# Patient Record
Sex: Male | Born: 1940 | Race: Black or African American | Hispanic: No
Health system: Southern US, Community
[De-identification: ages and names within clinical notes are randomized; demographics above are authoritative.]

## PROBLEM LIST (undated history)

## (undated) DIAGNOSIS — I10 Essential (primary) hypertension: Secondary | ICD-10-CM

## (undated) DIAGNOSIS — F0390 Unspecified dementia without behavioral disturbance: Secondary | ICD-10-CM

## (undated) DIAGNOSIS — E119 Type 2 diabetes mellitus without complications: Secondary | ICD-10-CM

## (undated) DIAGNOSIS — G5600 Carpal tunnel syndrome, unspecified upper limb: Secondary | ICD-10-CM

## (undated) DIAGNOSIS — H543 Unqualified visual loss, both eyes: Secondary | ICD-10-CM

## (undated) DIAGNOSIS — I219 Acute myocardial infarction, unspecified: Secondary | ICD-10-CM

## (undated) DIAGNOSIS — E7800 Pure hypercholesterolemia, unspecified: Secondary | ICD-10-CM

## (undated) DIAGNOSIS — E1139 Type 2 diabetes mellitus with other diabetic ophthalmic complication: Secondary | ICD-10-CM

## (undated) DIAGNOSIS — E1142 Type 2 diabetes mellitus with diabetic polyneuropathy: Secondary | ICD-10-CM

## (undated) DIAGNOSIS — K219 Gastro-esophageal reflux disease without esophagitis: Secondary | ICD-10-CM

## (undated) DIAGNOSIS — N529 Male erectile dysfunction, unspecified: Secondary | ICD-10-CM

## (undated) DIAGNOSIS — I2119 ST elevation (STEMI) myocardial infarction involving other coronary artery of inferior wall: Secondary | ICD-10-CM

## (undated) DIAGNOSIS — I2510 Atherosclerotic heart disease of native coronary artery without angina pectoris: Secondary | ICD-10-CM

## (undated) DIAGNOSIS — E041 Nontoxic single thyroid nodule: Secondary | ICD-10-CM

## (undated) DIAGNOSIS — H409 Unspecified glaucoma: Secondary | ICD-10-CM

## (undated) DIAGNOSIS — R972 Elevated prostate specific antigen [PSA]: Secondary | ICD-10-CM

## (undated) DIAGNOSIS — H548 Legal blindness, as defined in USA: Secondary | ICD-10-CM

## (undated) DIAGNOSIS — I639 Cerebral infarction, unspecified: Secondary | ICD-10-CM

## (undated) HISTORY — DX: Essential (primary) hypertension: I10

## (undated) HISTORY — PX: EYE SURGERY: SHX253

## (undated) HISTORY — DX: Male erectile dysfunction, unspecified: N52.9

## (undated) HISTORY — DX: Type 2 diabetes mellitus with diabetic polyneuropathy: E11.42

## (undated) HISTORY — DX: Carpal tunnel syndrome, unspecified upper limb: G56.00

## (undated) HISTORY — DX: Atherosclerotic heart disease of native coronary artery without angina pectoris: I25.10

## (undated) HISTORY — DX: Type 2 diabetes mellitus with other diabetic ophthalmic complication: E11.39

## (undated) HISTORY — DX: Elevated prostate specific antigen (PSA): R97.20

## (undated) HISTORY — DX: Acute myocardial infarction, unspecified: I21.9

## (undated) HISTORY — DX: Legal blindness, as defined in USA: H54.8

## (undated) HISTORY — DX: Gastro-esophageal reflux disease without esophagitis: K21.9

## (undated) HISTORY — PX: NECK MASS EXCISION: SHX2079

## (undated) HISTORY — PX: GLAUCOMA SURGERY: SHX656

## (undated) HISTORY — DX: Nontoxic single thyroid nodule: E04.1

---

## 1989-01-22 DIAGNOSIS — I219 Acute myocardial infarction, unspecified: Secondary | ICD-10-CM

## 1989-01-22 HISTORY — DX: Acute myocardial infarction, unspecified: I21.9

## 1997-06-21 ENCOUNTER — Emergency Department (HOSPITAL_COMMUNITY): Admission: EM | Admit: 1997-06-21 | Discharge: 1997-06-21 | Payer: Self-pay | Admitting: Emergency Medicine

## 1998-01-31 ENCOUNTER — Encounter: Payer: Self-pay | Admitting: Internal Medicine

## 1998-01-31 ENCOUNTER — Ambulatory Visit (HOSPITAL_COMMUNITY): Admission: RE | Admit: 1998-01-31 | Discharge: 1998-01-31 | Payer: Self-pay | Admitting: Internal Medicine

## 1998-11-14 ENCOUNTER — Emergency Department (HOSPITAL_COMMUNITY): Admission: EM | Admit: 1998-11-14 | Discharge: 1998-11-14 | Payer: Self-pay | Admitting: Emergency Medicine

## 1998-11-14 ENCOUNTER — Encounter: Payer: Self-pay | Admitting: Emergency Medicine

## 2000-01-23 HISTORY — PX: CORONARY ARTERY BYPASS GRAFT: SHX141

## 2000-04-25 ENCOUNTER — Inpatient Hospital Stay (HOSPITAL_COMMUNITY): Admission: EM | Admit: 2000-04-25 | Discharge: 2000-05-05 | Payer: Self-pay | Admitting: Emergency Medicine

## 2000-04-25 ENCOUNTER — Encounter: Payer: Self-pay | Admitting: Emergency Medicine

## 2000-04-26 HISTORY — PX: CORONARY ANGIOPLASTY WITH STENT PLACEMENT: SHX49

## 2000-04-27 ENCOUNTER — Encounter: Payer: Self-pay | Admitting: Surgery

## 2000-04-29 ENCOUNTER — Encounter: Payer: Self-pay | Admitting: Surgery

## 2000-04-30 ENCOUNTER — Encounter: Payer: Self-pay | Admitting: Surgery

## 2000-05-01 ENCOUNTER — Encounter: Payer: Self-pay | Admitting: Surgery

## 2000-05-28 ENCOUNTER — Encounter (HOSPITAL_COMMUNITY): Admission: RE | Admit: 2000-05-28 | Discharge: 2000-08-13 | Payer: Self-pay | Admitting: Cardiology

## 2002-11-18 ENCOUNTER — Emergency Department (HOSPITAL_COMMUNITY): Admission: EM | Admit: 2002-11-18 | Discharge: 2002-11-18 | Payer: Self-pay | Admitting: Emergency Medicine

## 2002-12-01 ENCOUNTER — Ambulatory Visit (HOSPITAL_COMMUNITY): Admission: RE | Admit: 2002-12-01 | Discharge: 2002-12-01 | Payer: Self-pay | Admitting: Internal Medicine

## 2004-01-03 ENCOUNTER — Ambulatory Visit (HOSPITAL_COMMUNITY): Admission: RE | Admit: 2004-01-03 | Discharge: 2004-01-04 | Payer: Self-pay | Admitting: Ophthalmology

## 2004-01-20 ENCOUNTER — Ambulatory Visit: Payer: Self-pay | Admitting: Internal Medicine

## 2004-01-25 ENCOUNTER — Ambulatory Visit: Payer: Self-pay | Admitting: Internal Medicine

## 2004-04-25 ENCOUNTER — Ambulatory Visit: Payer: Self-pay | Admitting: Internal Medicine

## 2004-05-01 ENCOUNTER — Ambulatory Visit (HOSPITAL_COMMUNITY): Admission: RE | Admit: 2004-05-01 | Discharge: 2004-05-02 | Payer: Self-pay | Admitting: Ophthalmology

## 2004-05-01 HISTORY — PX: VITRECTOMY: SHX106

## 2004-05-10 ENCOUNTER — Ambulatory Visit: Payer: Self-pay | Admitting: Internal Medicine

## 2004-07-27 ENCOUNTER — Ambulatory Visit: Payer: Self-pay | Admitting: Internal Medicine

## 2004-08-17 ENCOUNTER — Ambulatory Visit: Payer: Self-pay | Admitting: Internal Medicine

## 2004-08-25 ENCOUNTER — Ambulatory Visit: Payer: Self-pay | Admitting: Internal Medicine

## 2004-09-11 ENCOUNTER — Encounter: Admission: RE | Admit: 2004-09-11 | Discharge: 2004-09-11 | Payer: Self-pay | Admitting: Cardiology

## 2004-09-19 ENCOUNTER — Ambulatory Visit: Payer: Self-pay | Admitting: Internal Medicine

## 2004-10-22 HISTORY — PX: LAPAROSCOPIC CHOLECYSTECTOMY: SUR755

## 2004-11-06 ENCOUNTER — Encounter (INDEPENDENT_AMBULATORY_CARE_PROVIDER_SITE_OTHER): Payer: Self-pay | Admitting: *Deleted

## 2004-11-06 ENCOUNTER — Ambulatory Visit (HOSPITAL_COMMUNITY): Admission: RE | Admit: 2004-11-06 | Discharge: 2004-11-07 | Payer: Self-pay | Admitting: General Surgery

## 2005-02-12 ENCOUNTER — Inpatient Hospital Stay (HOSPITAL_COMMUNITY): Admission: EM | Admit: 2005-02-12 | Discharge: 2005-02-13 | Payer: Self-pay | Admitting: Emergency Medicine

## 2005-03-02 ENCOUNTER — Ambulatory Visit: Payer: Self-pay | Admitting: Internal Medicine

## 2005-05-30 ENCOUNTER — Ambulatory Visit: Payer: Self-pay | Admitting: Internal Medicine

## 2005-05-31 ENCOUNTER — Ambulatory Visit: Payer: Self-pay | Admitting: Internal Medicine

## 2005-08-29 ENCOUNTER — Ambulatory Visit: Payer: Self-pay | Admitting: Internal Medicine

## 2005-09-04 ENCOUNTER — Ambulatory Visit: Payer: Self-pay | Admitting: Internal Medicine

## 2005-12-04 ENCOUNTER — Ambulatory Visit: Payer: Self-pay | Admitting: Internal Medicine

## 2006-05-14 ENCOUNTER — Ambulatory Visit: Payer: Self-pay | Admitting: Internal Medicine

## 2006-05-14 LAB — CONVERTED CEMR LAB
BUN: 15 mg/dL (ref 6–23)
Basophils Absolute: 0 10*3/uL (ref 0.0–0.1)
Basophils Relative: 0.1 % (ref 0.0–1.0)
CO2: 30 meq/L (ref 19–32)
Calcium: 9.5 mg/dL (ref 8.4–10.5)
Chloride: 109 meq/L (ref 96–112)
Creatinine, Ser: 1.6 mg/dL — ABNORMAL HIGH (ref 0.4–1.5)
Eosinophils Absolute: 0.2 10*3/uL (ref 0.0–0.6)
Eosinophils Relative: 2.5 % (ref 0.0–5.0)
GFR calc Af Amer: 56 mL/min
GFR calc non Af Amer: 46 mL/min
Glucose, Bld: 146 mg/dL — ABNORMAL HIGH (ref 70–99)
HCT: 44.4 % (ref 39.0–52.0)
Hemoglobin: 14.4 g/dL (ref 13.0–17.0)
Hgb A1c MFr Bld: 8.6 % — ABNORMAL HIGH (ref 4.6–6.0)
Lymphocytes Relative: 36.6 % (ref 12.0–46.0)
MCHC: 32.5 g/dL (ref 30.0–36.0)
MCV: 84.3 fL (ref 78.0–100.0)
Monocytes Absolute: 0.2 10*3/uL (ref 0.2–0.7)
Monocytes Relative: 3.1 % (ref 3.0–11.0)
Neutro Abs: 4.5 10*3/uL (ref 1.4–7.7)
Neutrophils Relative %: 57.7 % (ref 43.0–77.0)
Platelets: 234 10*3/uL (ref 150–400)
Potassium: 5.3 meq/L — ABNORMAL HIGH (ref 3.5–5.1)
RBC: 5.27 M/uL (ref 4.22–5.81)
RDW: 13.3 % (ref 11.5–14.6)
Sodium: 143 meq/L (ref 135–145)
TSH: 1.19 microintl units/mL (ref 0.35–5.50)
WBC: 7.7 10*3/uL (ref 4.5–10.5)

## 2006-05-22 ENCOUNTER — Ambulatory Visit: Payer: Self-pay | Admitting: Internal Medicine

## 2006-05-24 ENCOUNTER — Ambulatory Visit: Payer: Self-pay | Admitting: Cardiology

## 2006-05-28 ENCOUNTER — Ambulatory Visit: Payer: Self-pay | Admitting: Internal Medicine

## 2006-05-28 LAB — CONVERTED CEMR LAB
BUN: 25 mg/dL — ABNORMAL HIGH (ref 6–23)
CO2: 27 meq/L (ref 19–32)
Calcium: 9.3 mg/dL (ref 8.4–10.5)
Chloride: 107 meq/L (ref 96–112)
Cholesterol: 120 mg/dL (ref 0–200)
Creatinine, Ser: 1.9 mg/dL — ABNORMAL HIGH (ref 0.4–1.5)
GFR calc Af Amer: 46 mL/min
GFR calc non Af Amer: 38 mL/min
Glucose, Bld: 98 mg/dL (ref 70–99)
HDL: 35.2 mg/dL — ABNORMAL LOW (ref >39.0)
Hgb A1c MFr Bld: 8.4 % — ABNORMAL HIGH (ref 4.6–6.0)
LDL Cholesterol: 74 mg/dL (ref 0–99)
Potassium: 5.3 meq/L — ABNORMAL HIGH (ref 3.5–5.1)
Sodium: 141 meq/L (ref 135–145)
Total CHOL/HDL Ratio: 3.4
Triglycerides: 55 mg/dL (ref 0–149)
VLDL: 11 mg/dL (ref 0–40)
Vit D, 1,25-Dihydroxy: 12 — ABNORMAL LOW (ref 20–57)
Vitamin B-12: 360 pg/mL (ref 211–911)

## 2006-06-21 ENCOUNTER — Ambulatory Visit: Payer: Self-pay | Admitting: Internal Medicine

## 2006-07-30 ENCOUNTER — Ambulatory Visit: Payer: Self-pay | Admitting: Endocrinology

## 2006-08-04 DIAGNOSIS — I2510 Atherosclerotic heart disease of native coronary artery without angina pectoris: Secondary | ICD-10-CM

## 2006-08-04 DIAGNOSIS — I119 Hypertensive heart disease without heart failure: Secondary | ICD-10-CM

## 2006-08-17 DIAGNOSIS — K8020 Calculus of gallbladder without cholecystitis without obstruction: Secondary | ICD-10-CM | POA: Insufficient documentation

## 2006-08-27 ENCOUNTER — Ambulatory Visit: Payer: Self-pay | Admitting: Internal Medicine

## 2006-08-27 LAB — CONVERTED CEMR LAB
Bilirubin Urine: NEGATIVE
Hemoglobin, Urine: NEGATIVE
Ketones, ur: NEGATIVE mg/dL
Leukocytes, UA: NEGATIVE
Nitrite: NEGATIVE
Specific Gravity, Urine: >=1.03 (ref 1.000–1.03)
Urine Glucose: NEGATIVE mg/dL
Urobilinogen, UA: 0.2 (ref 0.0–1.0)
pH: 5.5 (ref 5.0–8.0)

## 2006-11-08 ENCOUNTER — Ambulatory Visit: Payer: Self-pay | Admitting: Internal Medicine

## 2006-11-08 LAB — CONVERTED CEMR LAB
BUN: 22 mg/dL (ref 6–23)
CO2: 28 meq/L (ref 19–32)
Calcium: 9.5 mg/dL (ref 8.4–10.5)
Chloride: 107 meq/L (ref 96–112)
Creatinine, Ser: 1.8 mg/dL — ABNORMAL HIGH (ref 0.4–1.5)
GFR calc Af Amer: 49 mL/min
GFR calc non Af Amer: 40 mL/min
Glucose, Bld: 138 mg/dL — ABNORMAL HIGH (ref 70–99)
Hgb A1c MFr Bld: 8.4 % — ABNORMAL HIGH (ref 4.6–6.0)
Potassium: 5 meq/L (ref 3.5–5.1)
Sodium: 140 meq/L (ref 135–145)
TSH: 1.33 microintl units/mL (ref 0.35–5.50)
Vit D, 1,25-Dihydroxy: 40 (ref 30–89)

## 2006-11-12 ENCOUNTER — Ambulatory Visit: Payer: Self-pay | Admitting: Internal Medicine

## 2006-11-12 ENCOUNTER — Encounter: Payer: Self-pay | Admitting: Internal Medicine

## 2006-12-26 ENCOUNTER — Encounter: Payer: Self-pay | Admitting: Internal Medicine

## 2007-02-13 ENCOUNTER — Ambulatory Visit: Payer: Self-pay | Admitting: Internal Medicine

## 2007-02-13 DIAGNOSIS — E785 Hyperlipidemia, unspecified: Secondary | ICD-10-CM | POA: Insufficient documentation

## 2007-02-13 LAB — CONVERTED CEMR LAB
ALT: 37 units/L (ref 0–53)
AST: 25 units/L (ref 0–37)
BUN: 15 mg/dL (ref 6–23)
Blood Glucose, Fingerstick: 153
CO2: 29 meq/L (ref 19–32)
Calcium: 9.4 mg/dL (ref 8.4–10.5)
Chloride: 104 meq/L (ref 96–112)
Cholesterol: 109 mg/dL (ref 0–200)
Creatinine, Ser: 1.7 mg/dL — ABNORMAL HIGH (ref 0.4–1.5)
GFR calc Af Amer: 52 mL/min
GFR calc non Af Amer: 43 mL/min
Glucose, Bld: 157 mg/dL — ABNORMAL HIGH (ref 70–99)
HDL: 29.4 mg/dL — ABNORMAL LOW (ref >39.0)
Hgb A1c MFr Bld: 8.7 % — ABNORMAL HIGH (ref 4.6–6.0)
LDL Cholesterol: 65 mg/dL (ref 0–99)
Potassium: 4.7 meq/L (ref 3.5–5.1)
Sodium: 139 meq/L (ref 135–145)
Total CHOL/HDL Ratio: 3.7
Triglycerides: 73 mg/dL (ref 0–149)
VLDL: 15 mg/dL (ref 0–40)

## 2007-06-17 ENCOUNTER — Ambulatory Visit: Payer: Self-pay | Admitting: Internal Medicine

## 2007-06-18 ENCOUNTER — Encounter: Payer: Self-pay | Admitting: Internal Medicine

## 2007-06-18 LAB — CONVERTED CEMR LAB
BUN: 21 mg/dL (ref 6–23)
CO2: 28 meq/L (ref 19–32)
Calcium: 8.8 mg/dL (ref 8.4–10.5)
Chloride: 109 meq/L (ref 96–112)
Cholesterol: 128 mg/dL (ref 0–200)
Creatinine, Ser: 1.7 mg/dL — ABNORMAL HIGH (ref 0.4–1.5)
GFR calc Af Amer: 52 mL/min
GFR calc non Af Amer: 43 mL/min
Glucose, Bld: 278 mg/dL — ABNORMAL HIGH (ref 70–99)
HDL: 29 mg/dL — ABNORMAL LOW (ref >39.0)
Hgb A1c MFr Bld: 9 % — ABNORMAL HIGH (ref 4.6–6.0)
LDL Cholesterol: 75 mg/dL (ref 0–99)
Potassium: 4.6 meq/L (ref 3.5–5.1)
Sodium: 139 meq/L (ref 135–145)
TSH: 1.02 microintl units/mL (ref 0.35–5.50)
Total CHOL/HDL Ratio: 4.4
Triglycerides: 121 mg/dL (ref 0–149)
VLDL: 24 mg/dL (ref 0–40)

## 2007-06-19 ENCOUNTER — Encounter: Payer: Self-pay | Admitting: Internal Medicine

## 2007-06-24 ENCOUNTER — Ambulatory Visit: Payer: Self-pay | Admitting: Internal Medicine

## 2007-06-27 ENCOUNTER — Ambulatory Visit: Payer: Self-pay | Admitting: Internal Medicine

## 2007-06-27 DIAGNOSIS — M542 Cervicalgia: Secondary | ICD-10-CM | POA: Insufficient documentation

## 2007-06-27 LAB — CONVERTED CEMR LAB: Blood Glucose, Fingerstick: 223

## 2007-07-07 ENCOUNTER — Ambulatory Visit: Payer: Self-pay | Admitting: Internal Medicine

## 2007-07-07 DIAGNOSIS — M5010 Cervical disc disorder with radiculopathy, unspecified cervical region: Secondary | ICD-10-CM | POA: Insufficient documentation

## 2007-07-09 ENCOUNTER — Encounter: Payer: Self-pay | Admitting: Internal Medicine

## 2007-07-15 ENCOUNTER — Encounter: Admission: RE | Admit: 2007-07-15 | Discharge: 2007-07-15 | Payer: Self-pay | Admitting: Internal Medicine

## 2007-07-16 ENCOUNTER — Telehealth (INDEPENDENT_AMBULATORY_CARE_PROVIDER_SITE_OTHER): Payer: Self-pay | Admitting: *Deleted

## 2007-07-22 ENCOUNTER — Telehealth: Payer: Self-pay | Admitting: Internal Medicine

## 2007-08-29 ENCOUNTER — Encounter: Payer: Self-pay | Admitting: Internal Medicine

## 2007-10-06 ENCOUNTER — Encounter: Payer: Self-pay | Admitting: Internal Medicine

## 2007-10-23 ENCOUNTER — Ambulatory Visit: Payer: Self-pay | Admitting: Internal Medicine

## 2007-10-23 LAB — CONVERTED CEMR LAB
BUN: 16 mg/dL (ref 6–23)
CO2: 28 meq/L (ref 19–32)
Calcium: 9 mg/dL (ref 8.4–10.5)
Chloride: 104 meq/L (ref 96–112)
Creatinine, Ser: 1.5 mg/dL (ref 0.4–1.5)
GFR calc Af Amer: 60 mL/min
GFR calc non Af Amer: 49 mL/min
Glucose, Bld: 227 mg/dL — ABNORMAL HIGH (ref 70–99)
Hgb A1c MFr Bld: 9.6 % — ABNORMAL HIGH (ref 4.6–6.0)
Potassium: 4.3 meq/L (ref 3.5–5.1)
Sodium: 139 meq/L (ref 135–145)

## 2007-10-28 ENCOUNTER — Ambulatory Visit: Payer: Self-pay | Admitting: Internal Medicine

## 2007-10-29 DIAGNOSIS — K219 Gastro-esophageal reflux disease without esophagitis: Secondary | ICD-10-CM

## 2008-01-30 ENCOUNTER — Telehealth: Payer: Self-pay | Admitting: Internal Medicine

## 2008-02-17 ENCOUNTER — Ambulatory Visit: Payer: Self-pay | Admitting: Internal Medicine

## 2008-02-18 LAB — CONVERTED CEMR LAB
BUN: 15 mg/dL (ref 6–23)
CO2: 28 meq/L (ref 19–32)
Calcium: 9.3 mg/dL (ref 8.4–10.5)
Chloride: 104 meq/L (ref 96–112)
Creatinine, Ser: 1.4 mg/dL (ref 0.4–1.5)
GFR calc Af Amer: 65 mL/min
GFR calc non Af Amer: 54 mL/min
Glucose, Bld: 123 mg/dL — ABNORMAL HIGH (ref 70–99)
Hgb A1c MFr Bld: 8.2 % — ABNORMAL HIGH (ref 4.6–6.0)
Potassium: 3.9 meq/L (ref 3.5–5.1)
Sodium: 139 meq/L (ref 135–145)

## 2008-02-20 ENCOUNTER — Ambulatory Visit: Payer: Self-pay | Admitting: Internal Medicine

## 2008-05-26 ENCOUNTER — Encounter: Payer: Self-pay | Admitting: Internal Medicine

## 2008-05-31 ENCOUNTER — Encounter: Payer: Self-pay | Admitting: Internal Medicine

## 2008-06-08 ENCOUNTER — Ambulatory Visit: Payer: Self-pay | Admitting: Internal Medicine

## 2008-06-09 LAB — CONVERTED CEMR LAB
BUN: 26 mg/dL — ABNORMAL HIGH (ref 6–23)
CO2: 29 meq/L (ref 19–32)
Calcium: 9.6 mg/dL (ref 8.4–10.5)
Chloride: 106 meq/L (ref 96–112)
Cholesterol: 99 mg/dL (ref 0–200)
Creatinine, Ser: 1.7 mg/dL — ABNORMAL HIGH (ref 0.4–1.5)
GFR calc non Af Amer: 51.69 mL/min (ref >60)
Glucose, Bld: 186 mg/dL — ABNORMAL HIGH (ref 70–99)
HDL: 30.9 mg/dL — ABNORMAL LOW (ref >39.00)
Hgb A1c MFr Bld: 9.1 % — ABNORMAL HIGH (ref 4.6–6.5)
LDL Cholesterol: 47 mg/dL (ref 0–99)
Potassium: 4.7 meq/L (ref 3.5–5.1)
Sodium: 138 meq/L (ref 135–145)
Total CHOL/HDL Ratio: 3
Triglycerides: 107 mg/dL (ref 0.0–149.0)
VLDL: 21.4 mg/dL (ref 0.0–40.0)

## 2008-06-11 ENCOUNTER — Telehealth: Payer: Self-pay | Admitting: Internal Medicine

## 2008-06-11 ENCOUNTER — Ambulatory Visit: Payer: Self-pay | Admitting: Internal Medicine

## 2008-06-11 DIAGNOSIS — R0609 Other forms of dyspnea: Secondary | ICD-10-CM

## 2008-06-11 DIAGNOSIS — R0989 Other specified symptoms and signs involving the circulatory and respiratory systems: Secondary | ICD-10-CM

## 2008-06-30 ENCOUNTER — Encounter: Payer: Self-pay | Admitting: Internal Medicine

## 2008-06-30 ENCOUNTER — Ambulatory Visit: Payer: Self-pay

## 2008-07-01 ENCOUNTER — Telehealth: Payer: Self-pay | Admitting: Internal Medicine

## 2008-07-05 ENCOUNTER — Encounter: Payer: Self-pay | Admitting: Internal Medicine

## 2008-07-15 ENCOUNTER — Encounter: Payer: Self-pay | Admitting: Internal Medicine

## 2008-07-23 ENCOUNTER — Encounter: Payer: Self-pay | Admitting: Internal Medicine

## 2008-08-16 ENCOUNTER — Telehealth: Payer: Self-pay | Admitting: Internal Medicine

## 2008-08-26 ENCOUNTER — Telehealth: Payer: Self-pay | Admitting: Internal Medicine

## 2008-10-11 ENCOUNTER — Ambulatory Visit: Payer: Self-pay | Admitting: Internal Medicine

## 2008-10-12 LAB — CONVERTED CEMR LAB
ALT: 24 units/L (ref 0–53)
AST: 17 units/L (ref 0–37)
Albumin: 3.8 g/dL (ref 3.5–5.2)
Alkaline Phosphatase: 74 units/L (ref 39–117)
BUN: 20 mg/dL (ref 6–23)
Bilirubin, Direct: 0 mg/dL (ref 0.0–0.3)
CO2: 28 meq/L (ref 19–32)
Calcium: 9.5 mg/dL (ref 8.4–10.5)
Chloride: 107 meq/L (ref 96–112)
Cholesterol: 121 mg/dL (ref 0–200)
Creatinine, Ser: 1.8 mg/dL — ABNORMAL HIGH (ref 0.4–1.5)
GFR calc non Af Amer: 48.34 mL/min (ref >60)
Glucose, Bld: 91 mg/dL (ref 70–99)
HDL: 32 mg/dL — ABNORMAL LOW (ref >39.00)
Hgb A1c MFr Bld: 8.1 % — ABNORMAL HIGH (ref 4.6–6.5)
LDL Cholesterol: 75 mg/dL (ref 0–99)
Potassium: 4.7 meq/L (ref 3.5–5.1)
Sodium: 142 meq/L (ref 135–145)
Total Bilirubin: 0.8 mg/dL (ref 0.3–1.2)
Total CHOL/HDL Ratio: 4
Total Protein: 8.7 g/dL — ABNORMAL HIGH (ref 6.0–8.3)
Triglycerides: 71 mg/dL (ref 0.0–149.0)
VLDL: 14.2 mg/dL (ref 0.0–40.0)

## 2008-10-15 ENCOUNTER — Ambulatory Visit: Payer: Self-pay | Admitting: Internal Medicine

## 2008-11-10 ENCOUNTER — Encounter: Payer: Self-pay | Admitting: Internal Medicine

## 2008-11-16 ENCOUNTER — Telehealth: Payer: Self-pay | Admitting: Internal Medicine

## 2008-11-22 ENCOUNTER — Encounter: Payer: Self-pay | Admitting: Internal Medicine

## 2008-12-20 ENCOUNTER — Encounter: Payer: Self-pay | Admitting: Internal Medicine

## 2008-12-23 ENCOUNTER — Encounter: Payer: Self-pay | Admitting: Internal Medicine

## 2008-12-27 ENCOUNTER — Telehealth: Payer: Self-pay | Admitting: Internal Medicine

## 2008-12-28 ENCOUNTER — Encounter: Payer: Self-pay | Admitting: Internal Medicine

## 2009-02-10 ENCOUNTER — Encounter: Payer: Self-pay | Admitting: Internal Medicine

## 2009-02-21 ENCOUNTER — Ambulatory Visit: Payer: Self-pay | Admitting: Internal Medicine

## 2009-02-23 LAB — CONVERTED CEMR LAB
ALT: 21 units/L (ref 0–53)
AST: 17 units/L (ref 0–37)
Albumin: 3.8 g/dL (ref 3.5–5.2)
Alkaline Phosphatase: 61 units/L (ref 39–117)
BUN: 28 mg/dL — ABNORMAL HIGH (ref 6–23)
Bilirubin, Direct: 0.1 mg/dL (ref 0.0–0.3)
CO2: 27 meq/L (ref 19–32)
Calcium: 9.1 mg/dL (ref 8.4–10.5)
Chloride: 107 meq/L (ref 96–112)
Creatinine, Ser: 1.8 mg/dL — ABNORMAL HIGH (ref 0.4–1.5)
GFR calc non Af Amer: 48.29 mL/min (ref >60)
Glucose, Bld: 197 mg/dL — ABNORMAL HIGH (ref 70–99)
Hgb A1c MFr Bld: 7.9 % — ABNORMAL HIGH (ref 4.6–6.5)
Potassium: 4.5 meq/L (ref 3.5–5.1)
Sodium: 139 meq/L (ref 135–145)
Total Bilirubin: 0.5 mg/dL (ref 0.3–1.2)
Total Protein: 8.2 g/dL (ref 6.0–8.3)

## 2009-03-08 ENCOUNTER — Telehealth: Payer: Self-pay | Admitting: Internal Medicine

## 2009-04-25 ENCOUNTER — Encounter: Payer: Self-pay | Admitting: Internal Medicine

## 2009-05-18 ENCOUNTER — Ambulatory Visit: Payer: Self-pay | Admitting: Internal Medicine

## 2009-05-18 LAB — CONVERTED CEMR LAB
ALT: 22 units/L (ref 0–53)
AST: 19 units/L (ref 0–37)
Albumin: 4 g/dL (ref 3.5–5.2)
Alkaline Phosphatase: 59 units/L (ref 39–117)
BUN: 33 mg/dL — ABNORMAL HIGH (ref 6–23)
Bilirubin, Direct: 0.1 mg/dL (ref 0.0–0.3)
CO2: 26 meq/L (ref 19–32)
Calcium: 9.7 mg/dL (ref 8.4–10.5)
Chloride: 106 meq/L (ref 96–112)
Cholesterol: 104 mg/dL (ref 0–200)
Creatinine, Ser: 1.9 mg/dL — ABNORMAL HIGH (ref 0.4–1.5)
GFR calc non Af Amer: 45.34 mL/min (ref >60)
Glucose, Bld: 83 mg/dL (ref 70–99)
HDL: 29.6 mg/dL — ABNORMAL LOW (ref >39.00)
Hgb A1c MFr Bld: 6.9 % — ABNORMAL HIGH (ref 4.6–6.5)
LDL Cholesterol: 58 mg/dL (ref 0–99)
Potassium: 5.2 meq/L — ABNORMAL HIGH (ref 3.5–5.1)
Sodium: 139 meq/L (ref 135–145)
TSH: 1.21 microintl units/mL (ref 0.35–5.50)
Total Bilirubin: 0.6 mg/dL (ref 0.3–1.2)
Total CHOL/HDL Ratio: 4
Total Protein: 7.9 g/dL (ref 6.0–8.3)
Triglycerides: 83 mg/dL (ref 0.0–149.0)
VLDL: 16.6 mg/dL (ref 0.0–40.0)

## 2009-05-25 ENCOUNTER — Ambulatory Visit: Payer: Self-pay | Admitting: Internal Medicine

## 2009-06-01 ENCOUNTER — Ambulatory Visit: Payer: Self-pay | Admitting: Internal Medicine

## 2009-06-01 DIAGNOSIS — B351 Tinea unguium: Secondary | ICD-10-CM

## 2009-06-01 DIAGNOSIS — R05 Cough: Secondary | ICD-10-CM

## 2009-06-02 DIAGNOSIS — N529 Male erectile dysfunction, unspecified: Secondary | ICD-10-CM | POA: Insufficient documentation

## 2009-08-05 ENCOUNTER — Telehealth: Payer: Self-pay | Admitting: Internal Medicine

## 2009-08-10 ENCOUNTER — Telehealth: Payer: Self-pay | Admitting: Internal Medicine

## 2009-08-15 ENCOUNTER — Ambulatory Visit: Payer: Self-pay | Admitting: Internal Medicine

## 2009-09-07 ENCOUNTER — Encounter: Payer: Self-pay | Admitting: Internal Medicine

## 2009-09-14 ENCOUNTER — Encounter: Payer: Self-pay | Admitting: Internal Medicine

## 2009-09-20 ENCOUNTER — Encounter: Payer: Self-pay | Admitting: Internal Medicine

## 2009-10-19 ENCOUNTER — Encounter: Payer: Self-pay | Admitting: Internal Medicine

## 2009-11-15 ENCOUNTER — Ambulatory Visit: Payer: Self-pay | Admitting: Internal Medicine

## 2009-11-16 LAB — CONVERTED CEMR LAB
ALT: 19 units/L (ref 0–53)
AST: 16 units/L (ref 0–37)
Albumin: 3.8 g/dL (ref 3.5–5.2)
Alkaline Phosphatase: 63 units/L (ref 39–117)
BUN: 24 mg/dL — ABNORMAL HIGH (ref 6–23)
Basophils Absolute: 0 10*3/uL (ref 0.0–0.1)
Basophils Relative: 0.3 % (ref 0.0–3.0)
Bilirubin Urine: NEGATIVE
Bilirubin, Direct: 0.1 mg/dL (ref 0.0–0.3)
CO2: 25 meq/L (ref 19–32)
Calcium: 9.2 mg/dL (ref 8.4–10.5)
Chloride: 100 meq/L (ref 96–112)
Cholesterol: 126 mg/dL (ref 0–200)
Creatinine, Ser: 1.8 mg/dL — ABNORMAL HIGH (ref 0.4–1.5)
Eosinophils Absolute: 0.1 10*3/uL (ref 0.0–0.7)
Eosinophils Relative: 1.8 % (ref 0.0–5.0)
GFR calc non Af Amer: 49.78 mL/min (ref >60)
Glucose, Bld: 77 mg/dL (ref 70–99)
HCT: 42.2 % (ref 39.0–52.0)
HDL: 34.2 mg/dL — ABNORMAL LOW (ref >39.00)
Hemoglobin, Urine: NEGATIVE
Hemoglobin: 14 g/dL (ref 13.0–17.0)
Hgb A1c MFr Bld: 6.6 % — ABNORMAL HIGH (ref 4.6–6.5)
Ketones, ur: NEGATIVE mg/dL
LDL Cholesterol: 75 mg/dL (ref 0–99)
Leukocytes, UA: NEGATIVE
Lymphocytes Relative: 42.4 % (ref 12.0–46.0)
Lymphs Abs: 3.1 10*3/uL (ref 0.7–4.0)
MCHC: 33.2 g/dL (ref 30.0–36.0)
MCV: 85 fL (ref 78.0–100.0)
Monocytes Absolute: 0.5 10*3/uL (ref 0.1–1.0)
Monocytes Relative: 7.5 % (ref 3.0–12.0)
Neutro Abs: 3.5 10*3/uL (ref 1.4–7.7)
Neutrophils Relative %: 48 % (ref 43.0–77.0)
Nitrite: NEGATIVE
PSA: 10.6 ng/mL — ABNORMAL HIGH (ref 0.10–4.00)
Platelets: 225 10*3/uL (ref 150.0–400.0)
Potassium: 4.7 meq/L (ref 3.5–5.1)
RBC: 4.96 M/uL (ref 4.22–5.81)
RDW: 14.6 % (ref 11.5–14.6)
Sodium: 135 meq/L (ref 135–145)
Specific Gravity, Urine: <=1.005 (ref 1.000–1.030)
TSH: 1.41 microintl units/mL (ref 0.35–5.50)
Total Bilirubin: 0.4 mg/dL (ref 0.3–1.2)
Total CHOL/HDL Ratio: 4
Total Protein, Urine: NEGATIVE mg/dL
Total Protein: 7.6 g/dL (ref 6.0–8.3)
Triglycerides: 84 mg/dL (ref 0.0–149.0)
Urine Glucose: NEGATIVE mg/dL
Urobilinogen, UA: 0.2 (ref 0.0–1.0)
VLDL: 16.8 mg/dL (ref 0.0–40.0)
Vitamin B-12: 306 pg/mL (ref 211–911)
WBC: 7.2 10*3/uL (ref 4.5–10.5)
pH: 5.5 (ref 5.0–8.0)

## 2009-11-18 ENCOUNTER — Encounter: Payer: Self-pay | Admitting: Internal Medicine

## 2009-11-18 ENCOUNTER — Ambulatory Visit: Payer: Self-pay | Admitting: Internal Medicine

## 2009-11-20 DIAGNOSIS — G5600 Carpal tunnel syndrome, unspecified upper limb: Secondary | ICD-10-CM

## 2009-11-23 ENCOUNTER — Telehealth: Payer: Self-pay | Admitting: Internal Medicine

## 2009-12-01 ENCOUNTER — Telehealth: Payer: Self-pay | Admitting: Internal Medicine

## 2010-02-10 ENCOUNTER — Encounter: Payer: Self-pay | Admitting: Internal Medicine

## 2010-02-10 ENCOUNTER — Ambulatory Visit
Admission: RE | Admit: 2010-02-10 | Discharge: 2010-02-10 | Payer: Self-pay | Source: Home / Self Care | Attending: Internal Medicine | Admitting: Internal Medicine

## 2010-02-10 ENCOUNTER — Other Ambulatory Visit: Payer: Self-pay | Admitting: Internal Medicine

## 2010-02-10 LAB — BASIC METABOLIC PANEL
BUN: 24 mg/dL — ABNORMAL HIGH (ref 6–23)
CO2: 27 mEq/L (ref 19–32)
Calcium: 9.5 mg/dL (ref 8.4–10.5)
Chloride: 106 mEq/L (ref 96–112)
Creatinine, Ser: 1.9 mg/dL — ABNORMAL HIGH (ref 0.4–1.5)
GFR: 46.66 mL/min — ABNORMAL LOW (ref >60.00)
Glucose, Bld: 58 mg/dL — ABNORMAL LOW (ref 70–99)
Potassium: 5 mEq/L (ref 3.5–5.1)
Sodium: 141 mEq/L (ref 135–145)

## 2010-02-10 LAB — HEMOGLOBIN A1C: Hgb A1c MFr Bld: 6.8 % — ABNORMAL HIGH (ref 4.6–6.5)

## 2010-02-10 LAB — PSA: PSA: 4.66 ng/mL — ABNORMAL HIGH (ref 0.10–4.00)

## 2010-02-14 LAB — CONVERTED CEMR LAB
PSA, Free Pct: 9 — ABNORMAL LOW (ref >25)
PSA, Free: 0.4 ng/mL
PSA: 4.52 ng/mL — ABNORMAL HIGH (ref <=4.00)

## 2010-02-17 ENCOUNTER — Ambulatory Visit
Admission: RE | Admit: 2010-02-17 | Discharge: 2010-02-17 | Payer: Self-pay | Source: Home / Self Care | Attending: Internal Medicine | Admitting: Internal Medicine

## 2010-02-21 NOTE — Medication Information (Signed)
Summary: [facility]  [facility]N for [facility]   Imported By: Rise Patience 09/20/2009 15:21:05  _____________________________________________________________________  External Attachment:    Type:   Image     Comment:   External Document

## 2010-02-21 NOTE — Letter (Signed)
Summary: Doristine Church MD  Doristine Church MD   Imported By: Phillis Knack 10/25/2009 13:45:55  _____________________________________________________________________  External Attachment:    Type:   Image     Comment:   External Document

## 2010-02-21 NOTE — Assessment & Plan Note (Signed)
Summary: follow up to discuss rx with daughter-lb   Vital Signs:  Patient profile:   70 year old male Height:      66 inches Weight:      203 pounds BMI:     32.88 O2 Sat:      98 % on Room air Temp:     98.4 degrees F oral Pulse rate:   100 / minute Pulse rhythm:   regular Resp:     16 per minute BP sitting:   172 / 80  (right arm) Cuff size:   regular  Vitals Entered By: Jonathon Resides, CMA(AAMA) (August 15, 2009 4:31 PM)  O2 Flow:  Room air CC: Medication reconciliation Is Patient Diabetic? Yes Comments pt is not taking Viagra   CC:  Medication reconciliation.  History of Present Illness: The patient presents for a follow up of hypertension, diabetes, hyperlipidemia. He forgot to take his BP meds  today. He came in with 3 fam members   Current Medications (verified): 1)  Amaryl 4 Mg  Tabs (Glimepiride) .Marland Kitchen.. 1 Two Times A Day 2)  Metoprolol Succinate 50 Mg  Tb24 (Metoprolol Succinate) .... Once Daily 3)  Metformin Hcl 1000 Mg Tabs (Metformin Hcl) .... Take 1 Tablet By Mouth Two Times A Day 4)  Simvastatin 40 Mg Tabs (Simvastatin) .... Take One Tablet At Bedtime 5)  Omeprazole 20 Mg Cpdr (Omeprazole) .... 2 A Day 6)  Verapamil Hcl Cr 180 Mg Tbcr (Verapamil Hcl) .Marland Kitchen.. 1 By Mouth Daily 7)  Vitamin D3 1000 Unit  Tabs (Cholecalciferol) .Marland Kitchen.. 1 Qd 8)  Vitamin B Complex   Caps (B Complex Vitamins) .... Once Daily 9)  Aspirin 81 Mg  Tbec (Aspirin) .... One By Mouth Every Day 10)  Losartan Potassium-Hctz 100-25 Mg Tabs (Losartan Potassium-Hctz) .Marland Kitchen.. 1 By Mouth Once Daily For Blood Pressure 11)  Viagra 100 Mg Tabs (Sildenafil Citrate) .Marland Kitchen.. 1 By Mouth Once Daily Prn  Allergies (verified): 1)  Benazepril Hcl (Benazepril Hcl)  Past History:  Past Medical History: Last updated: 06/01/2009 Coronary artery disease Diabetes mellitus, type II Hypertension dm periph neuropathy glaucoma blindness due to DM thyroid nodule Cholelithiasis mi 1991 GERD ED  Past Surgical  History: Last updated: 11/12/2006 Cholecystectomy  Family History: Last updated: 11/12/2006 Family History of CAD Male 1st degree relative <60 Family History Diabetes 1st degree relative  Social History: Last updated: 11/12/2006 Retired Married, but separated Former Smoker Alcohol use-no Regular exercise-no  Review of Systems  The patient denies fever, prolonged cough, abdominal pain, melena, and hematochezia.    Physical Exam  General:  overweight-appearing.   Eyes:  No corneal or conjunctival inflammation noted. BLIND Nose:  External nasal examination shows no deformity or inflammation. Nasal mucosa are pink and moist without lesions or exudates. Mouth:  Oral mucosa and oropharynx without lesions or exudates.  Teeth in good repair. Neck:  No deformities, masses, or tenderness noted. Lungs:  Normal respiratory effort, chest expands symmetrically. Lungs are clear to auscultation, no crackles or wheezes. Heart:  Normal rate and regular rhythm. S1 and S2 normal without gallop, murmur, click, rub or other extra sounds. Abdomen:  Bowel sounds positive,abdomen soft and non-tender without masses, organomegaly or hernias noted. Msk:  Using a cane Feet OK B Extremities:  No edema Neurologic:  ataxic gait.   Skin:  Intact without suspicious lesions or rashes Psych:  Cognition and judgment appear intact. Alert and cooperative with normal attention span and concentration.    Impression & Recommendations:  Problem #  1:  CORONARY ARTERY DISEASE (ICD-414.00) Assessment Unchanged  His updated medication list for this problem includes:    Metoprolol Succinate 50 Mg Tb24 (Metoprolol succinate) .Marland Kitchen... 1 once daily for blood pressure    Verapamil Hcl Cr 180 Mg Tbcr (Verapamil hcl) .Marland Kitchen... 1 by mouth daily for blood pressure    Losartan Potassium-hctz 100-25 Mg Tabs (Losartan potassium-hctz) .Marland Kitchen... 1 by mouth once daily for blood pressure    Aspirin 81 Mg Tbec (Aspirin) ..... One by mouth  every day for heart  Problem # 2:  GERD (ICD-530.81) Assessment: Improved  His updated medication list for this problem includes:    Omeprazole 20 Mg Cpdr (Omeprazole) .Marland Kitchen... 2 a day for stomach  Problem # 3:  HYPERTENSION (ICD-401.9) Assessment: Deteriorated  Compliance encouraged.  His updated medication list for this problem includes:    Metoprolol Succinate 50 Mg Tb24 (Metoprolol succinate) .Marland Kitchen... 1 once daily for blood pressure    Verapamil Hcl Cr 180 Mg Tbcr (Verapamil hcl) .Marland Kitchen... 1 by mouth daily for blood pressure    Losartan Potassium-hctz 100-25 Mg Tabs (Losartan potassium-hctz) .Marland Kitchen... 1 by mouth once daily for blood pressure  Problem # 4:  HYPERCHOLESTEROLEMIA (ICD-272.0) Assessment: Comment Only  The following medications were removed from the medication list:    Simvastatin 40 Mg Tabs (Simvastatin) .Marland Kitchen... Take one tablet at bedtime His updated medication list for this problem includes:    Lovastatin 20 Mg Tabs (Lovastatin) .Marland Kitchen... 1 by mouth once daily for cholesterol  Complete Medication List: 1)  Amaryl 4 Mg Tabs (Glimepiride) .Marland Kitchen.. 1 two times a day for diabetes 2)  Metformin Hcl 1000 Mg Tabs (Metformin hcl) .... Take 1 tablet by mouth two times a day for diabetes 3)  Metoprolol Succinate 50 Mg Tb24 (Metoprolol succinate) .Marland Kitchen.. 1 once daily for blood pressure 4)  Verapamil Hcl Cr 180 Mg Tbcr (Verapamil hcl) .Marland Kitchen.. 1 by mouth daily for blood pressure 5)  Omeprazole 20 Mg Cpdr (Omeprazole) .... 2 a day for stomach 6)  Losartan Potassium-hctz 100-25 Mg Tabs (Losartan potassium-hctz) .Marland Kitchen.. 1 by mouth once daily for blood pressure 7)  Lovastatin 20 Mg Tabs (Lovastatin) .Marland Kitchen.. 1 by mouth once daily for cholesterol 8)  Aspirin 81 Mg Tbec (Aspirin) .... One by mouth every day for heart 9)  Viagra 100 Mg Tabs (Sildenafil citrate) .Marland Kitchen.. 1 by mouth once daily prn 10)  Vitamin D3 1000 Unit Tabs (Cholecalciferol) .Marland Kitchen.. 1 qd 11)  Vitamin B Complex Caps (B complex vitamins) .... Once  daily  Patient Instructions: 1)  Please schedule a follow-up appointment in 3 months well with labs v70.0. 2)  HbgA1C prior to visit, ICD-9:250.00 3)  vit B12 782.0 Prescriptions: LOVASTATIN 20 MG TABS (LOVASTATIN) 1 by mouth once daily for cholesterol  #90 x 3   Entered and Authorized by:   Cassandria Anger MD   Signed by:   Cassandria Anger MD on 08/15/2009   Method used:   Print then Give to Patient   RxID:   [id number] VIAGRA 100 MG TABS (SILDENAFIL CITRATE) 1 by mouth once daily prn  #12 x 3   Entered and Authorized by:   Cassandria Anger MD   Signed by:   Cassandria Anger MD on 08/15/2009   Method used:   Print then Give to Patient   RxID:   [id number] LOVASTATIN 20 MG TABS (LOVASTATIN) 1 by mouth once daily for cholesterol  #90 x 3   Entered and Authorized by:   Evie Lacks Virginie Josten  MD   Signed by:   Cassandria Anger MD on 08/15/2009   Method used:   Print then Give to Patient   RxID:   [id number] LOSARTAN POTASSIUM-HCTZ 100-25 MG TABS (LOSARTAN POTASSIUM-HCTZ) 1 by mouth once daily for blood pressure  #90 x 3   Entered and Authorized by:   Cassandria Anger MD   Signed by:   Cassandria Anger MD on 08/15/2009   Method used:   Print then Give to Patient   RxID:   [id number] OMEPRAZOLE 20 MG CPDR (OMEPRAZOLE) 2 a day for STOMACH  #180 x 3   Entered and Authorized by:   Cassandria Anger MD   Signed by:   Cassandria Anger MD on 08/15/2009   Method used:   Print then Give to Patient   RxID:   [id number] VERAPAMIL HCL CR 180 MG TBCR (VERAPAMIL HCL) 1 by mouth daily for BLOOD PRESSURE  #90 x 3   Entered and Authorized by:   Cassandria Anger MD   Signed by:   Cassandria Anger MD on 08/15/2009   Method used:   Print then Give to Patient   RxID:   [id number] METOPROLOL SUCCINATE 50 MG  TB24 (METOPROLOL SUCCINATE) 1 once daily for BLOOD PRESSURE  #90 x 3   Entered and Authorized by:   Cassandria Anger MD   Signed  by:   Cassandria Anger MD on 08/15/2009   Method used:   Print then Give to Patient   RxID:   [id number] METFORMIN HCL 1000 MG TABS (METFORMIN HCL) Take 1 tablet by mouth two times a day for DIABETES  #180 x 3   Entered and Authorized by:   Cassandria Anger MD   Signed by:   Cassandria Anger MD on 08/15/2009   Method used:   Print then Give to Patient   RxID:   [id number] AMARYL 4 MG  TABS (GLIMEPIRIDE) 1 two times a day for DIABETES  #180 x 3   Entered and Authorized by:   Cassandria Anger MD   Signed by:   Cassandria Anger MD on 08/15/2009   Method used:   Print then Give to Patient   RxID:   [id number]

## 2010-02-21 NOTE — Progress Notes (Signed)
Summary: Med ?  Phone Note From Pharmacy   Caller: [facility]  Summary of Call: rec fax rf request for 1.)Simvastain 40mg  1 by mouth at bedtime #90.  We have Lovastatin on our med list.   2.) Also received request for Benazepril/HCTZ 20-25mg  1 by mouth once daily # 90.  Our list says losartan/HCTZ 100-25 mg. Please advise which meds to RF???   Initial call taken by: Jonathon Resides, [facility]),  November 23, 2009 9:57 AM  Follow-up for Phone Call        Pls use what we have on the current list Thank you!  Follow-up by: Cassandria Anger MD,  November 23, 2009 5:40 PM  Additional Follow-up for Phone Call Additional follow up Details #1::        done Additional Follow-up by: Jonathon Resides, [facility]),  November 24, 2009 2:29 PM    Prescriptions: LOVASTATIN 20 MG TABS (LOVASTATIN) 1 by mouth once daily for cholesterol  #90 x 3   Entered by:   Jonathon Resides, CMA(AAMA)   Authorized by:   Cassandria Anger MD   Signed by:   Jonathon Resides, CMA(AAMA) on 11/24/2009   Method used:   Faxed to ...       [facility] (mail-order)             , [place]         Ph: [phone]       Fax: [phone]   RxID:   [id number] LOSARTAN POTASSIUM-HCTZ 100-25 MG TABS (LOSARTAN POTASSIUM-HCTZ) 1 by mouth once daily for blood pressure  #90 x 3   Entered by:   Jonathon Resides, CMA(AAMA)   Authorized by:   Cassandria Anger MD   Signed by:   Jonathon Resides, CMA(AAMA) on 11/24/2009   Method used:   Faxed to ...       [facility] (mail-order)             , [place]         Ph: [phone]       Fax: [phone]   RxID:   [id number]

## 2010-02-21 NOTE — Letter (Signed)
Summary: [facility]  [facility]   Imported By: Phillis Knack 04/28/2009 09:22:11  _____________________________________________________________________  External Attachment:    Type:   Image     Comment:   External Document

## 2010-02-21 NOTE — Medication Information (Signed)
Summary: [facility]   Imported By: Bubba Hales 09/12/2009 08:41:36  _____________________________________________________________________  External Attachment:    Type:   Image     Comment:   External Document

## 2010-02-21 NOTE — Progress Notes (Signed)
  Phone Note Call from Patient   Caller: Daughter-Lorraine [phone] Summary of Call: Patient daughter called requesting order for diabetic shoes. She is aware that MD is out of the office until 08/08/09. Please advise if OK.Ellison Hughs Archie CMA  August 05, 2009 11:59 AM   Follow-up for Phone Call        ok Follow-up by: Cassandria Anger MD,  August 07, 2009 2:46 PM  Additional Follow-up for Phone Call Additional follow up Details #1::        Pt informed, order signed and ready Additional Follow-up by: Charlsie Quest, CMA,  August 08, 2009 3:46 PM

## 2010-02-21 NOTE — Progress Notes (Signed)
Summary: Med Rf sent  Phone Note Call from Patient   Summary of Call: Patient is requesting a call confirming that rx's were sent to mail order.  Initial call taken by: Charlsie Quest, [place],  December 01, 2009 2:26 PM  Follow-up for Phone Call         pt informed rfs sent. Follow-up by: Jonathon Resides, [facility]),  December 01, 2009 4:50 PM

## 2010-02-21 NOTE — Progress Notes (Signed)
Summary: SIMVASTATIN   Phone Note From Pharmacy   Caller: [phone] [facility] Summary of Call: Verapamil & Simvastatin have drug interaction causing increasing in simvastatin. FDA reccomends decreasing simvastatin to 10mg  or changing to pravastatin. Please advise.   When calling back provide pt's [employer] ID # [id number] DOB & Zip code. [facility].  Initial call taken by: Charlsie Quest, [place],  August 10, 2009 4:16 PM  Follow-up for Phone Call        Keep return office visit - will address Follow-up by: Cassandria Anger MD,  August 10, 2009 10:49 PM  Additional Follow-up for Phone Call Additional follow up Details #1::        Left vm for pharmacist at [facility] Additional Follow-up by: Charlsie Quest, [place],  August 11, 2009 11:39 AM

## 2010-02-21 NOTE — Medication Information (Signed)
Summary: [facility]  [facility]   Imported By: Phillis Knack 09/23/2009 08:06:40  _____________________________________________________________________  External Attachment:    Type:   Image     Comment:   External Document

## 2010-02-21 NOTE — Progress Notes (Signed)
Summary: REQ FOR RX  Phone Note Call from Patient Call back at [facility] Phone [phone]   Summary of Call: Pt c/o "cold", sinus pressure/congestion, fever and slightly productive cough. Robitussin has given no relief. Please advise, he would like rx for antibiotic.  Initial call taken by: Charlsie Quest, [place],  March 08, 2009 8:24 AM  Follow-up for Phone Call        zpac Follow-up by: Cassandria Anger MD,  March 08, 2009 1:01 PM    New/Updated Medications: ZITHROMAX Z-PAK 250 MG TABS (AZITHROMYCIN) as dirrected Prescriptions: ZITHROMAX Z-PAK 250 MG TABS (AZITHROMYCIN) as dirrected  #1 x 0   Entered and Authorized by:   Cassandria Anger MD   Signed by:   Estell Harpin CMA on 03/08/2009   Method used:   Electronically to        [facility] (retail)       [address]       [place], [place]  [address]       Ph: [phone]       Fax: [phone]   RxID:   [id number]

## 2010-02-21 NOTE — Assessment & Plan Note (Signed)
Summary: 4 MO FU/$50/PN   Vital Signs:  Patient profile:   70 year old male Weight:      205 pounds Temp:     97.3 degrees F oral Pulse rate:   74 / minute BP sitting:   134 / 64  (left arm)  Vitals Entered By: Doralee Albino (February 21, 2009 4:31 PM) CC: f/u Is Patient Diabetic? Yes Comments per pt.  cbg is 147mg /dl  this ami     CC:  f/u.  History of Present Illness: The patient presents for a follow up of hypertension, diabetes, hyperlipidemia C/o L fingers 1-2 numb  Preventive Screening-Counseling & Management  Alcohol-Tobacco     Smoking Status: quit  Current Medications (verified): 1)  Amaryl 4 Mg  Tabs (Glimepiride) .Marland Kitchen.. 1 Two Times A Day 2)  Metoprolol Succinate 50 Mg  Tb24 (Metoprolol Succinate) .... Once Daily 3)  Metformin Hcl 1000 Mg Tabs (Metformin Hcl) .... Take 1 Tablet By Mouth Two Times A Day 4)  Simvastatin 40 Mg Tabs (Simvastatin) .... Take One Tablet At Bedtime 5)  Omeprazole 20 Mg Cpdr (Omeprazole) .... 2 A Day 6)  Ranitidine Hcl 150 Mg Caps (Ranitidine Hcl) .... Bid 7)  Verapamil Hcl Cr 180 Mg Tbcr (Verapamil Hcl) .Marland Kitchen.. 1 By Mouth Daily 8)  Vitamin D3 1000 Unit  Tabs (Cholecalciferol) .Marland Kitchen.. 1 Qd 9)  Vitamin B Complex   Caps (B Complex Vitamins) .... Once Daily 10)  Aspirin 81 Mg  Tbec (Aspirin) .... One By Mouth Every Day 11)  Benazepril-Hydrochlorothiazide 20-25 Mg Tabs (Benazepril-Hydrochlorothiazide) .Marland Kitchen.. 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    Physical Exam  General:   overweight-appearing.   Eyes:  No corneal or conjunctival inflammation noted. BLIND Nose:  External nasal examination shows no deformity or inflammation. Nasal mucosa are pink and moist without lesions or exudates. Mouth:  Oral mucosa and oropharynx without lesions or exudates.  Teeth in good repair. Neck:  No deformities, masses, or tenderness noted. Lungs:  Normal respiratory effort, chest expands symmetrically. Lungs are clear to auscultation, no crackles or wheezes. Heart:  Normal rate and regular rhythm. S1 and S2 normal without gallop, murmur, click, rub or other extra sounds. Abdomen:  Bowel sounds positive,abdomen soft and non-tender without masses, organomegaly or hernias noted. Msk:  Using a cane Feet OK B Neurologic:  ataxic gait.   Skin:  Intact without suspicious lesions or rashes Psych:  Cognition and judgment appear intact. Alert and cooperative with normal attention span and concentration.    Impression & Recommendations:  Problem # 1:  DIABETES MELLITUS, TYPE II (ICD-250.00) Assessment Unchanged Risks of noncompliance with diet and  treatment discussed. Compliance encouraged.  His updated medication list for this problem includes:    Amaryl 4 Mg Tabs (Glimepiride) .Marland Kitchen... 1 two times a day    Metformin Hcl 1000 Mg Tabs (Metformin hcl) .Marland Kitchen... Take 1 tablet by mouth two times a day    Aspirin 81 Mg Tbec (Aspirin) ..... One by mouth every day    Benazepril-hydrochlorothiazide 20-25 Mg Tabs (Benazepril-hydrochlorothiazide) .Marland Kitchen... 1 by mouth qd  Problem # 2:  GERD (ICD-530.81) Assessment: Unchanged  The following medications were removed from the medication list:    Ranitidine Hcl 150 Mg Caps (Ranitidine hcl) ..... Bid His updated medication list for this problem includes:    Omeprazole 20 Mg Cpdr (Omeprazole) .Marland Kitchen... 2 a day  Problem # 3:  CORONARY ARTERY DISEASE (ICD-414.00) Assessment: Unchanged  His updated medication list for this problem includes:    Metoprolol  Succinate 50 Mg Tb24 (Metoprolol succinate) ..... Once daily    Verapamil Hcl Cr 180 Mg Tbcr (Verapamil hcl) .Marland Kitchen... 1 by mouth daily    Aspirin 81 Mg Tbec (Aspirin) ..... One by mouth every day    Benazepril-hydrochlorothiazide 20-25 Mg Tabs (Benazepril-hydrochlorothiazide) .Marland Kitchen... 1 by mouth qd  Problem # 4:  HYPERCHOLESTEROLEMIA (ICD-272.0) Assessment: Unchanged  His updated medication list for this problem includes:    Simvastatin 40 Mg Tabs (Simvastatin) .Marland Kitchen... Take one tablet at bedtime  Complete Medication List: 1)  Amaryl 4 Mg Tabs (Glimepiride) .Marland Kitchen.. 1 two times a day 2)  Metoprolol Succinate 50 Mg Tb24 (Metoprolol succinate) .... Once daily 3)  Metformin Hcl 1000 Mg Tabs (Metformin hcl) .... Take 1 tablet by mouth two times a day 4)  Simvastatin 40 Mg Tabs (Simvastatin) .... Take one tablet at bedtime 5)  Omeprazole 20 Mg Cpdr (Omeprazole) .... 2 a day 6)  Verapamil Hcl Cr 180 Mg Tbcr (Verapamil hcl) .Marland Kitchen.. 1 by mouth daily 7)  Vitamin D3 1000 Unit Tabs (Cholecalciferol) .Marland Kitchen.. 1 qd 8)  Vitamin B Complex Caps (B complex vitamins) .... Once daily 9)  Aspirin 81 Mg Tbec (Aspirin) .... One by mouth every day 10)  Benazepril-hydrochlorothiazide 20-25 Mg Tabs (Benazepril-hydrochlorothiazide) .Marland Kitchen.. 1 by mouth qd  Patient Instructions: 1)  Please schedule a follow-up appointment in 3 months. 2)  BMP prior to visit, ICD-9: 3)  Hepatic Panel prior to visit, ICD-9: 4)  Lipid Panel prior to visit, ICD-9:250.00  266.20 5)  TSH prior to visit, ICD-9: 6)  HbgA1C prior to visit, ICD-9: Prescriptions: BENAZEPRIL-HYDROCHLOROTHIAZIDE 20-25 MG TABS (BENAZEPRIL-HYDROCHLOROTHIAZIDE) 1 by mouth qd  #90 x 3   Entered and Authorized by:   Cassandria Anger MD   Signed by:   Cassandria Anger MD on 02/21/2009   Method used:   Print then Give to Patient   RxID:   [id number] ASPIRIN 81 MG  TBEC (ASPIRIN) one by mouth every day  #90 x 3   Entered and Authorized by:   Cassandria Anger MD    Signed by:   Cassandria Anger MD on 02/21/2009   Method used:   Print then Give to Patient   RxID:   [id number] VITAMIN B COMPLEX   CAPS (B COMPLEX VITAMINS) once daily  #90 x 3   Entered and Authorized by:   Cassandria Anger MD   Signed by:   Cassandria Anger MD on 02/21/2009   Method used:   Print then Give to Patient   [id number] VITAMIN D3 1000 UNIT  TABS (CHOLECALCIFEROL) 1 qd  #90 x 3   Entered and Authorized by:   Cassandria Anger MD   Signed by:   Cassandria Anger MD on 02/21/2009   Method used:   Print then Give to Patient   RxID:   [id number] VERAPAMIL HCL CR 180 MG TBCR (VERAPAMIL HCL) 1 by mouth daily  #90 x 3   Entered and Authorized by:   Cassandria Anger MD   Signed by:   Evie Lacks  Plotnikov MD on 02/21/2009   Method used:   Print then Give to Patient   RxID:   [id number] OMEPRAZOLE 20 MG CPDR (OMEPRAZOLE) 2 a day  #180 x 3   Entered and Authorized by:   Cassandria Anger MD   Signed by:   Cassandria Anger MD on 02/21/2009   Method used:   Print then Give to Patient   RxID:   [id number] SIMVASTATIN 40 MG TABS (SIMVASTATIN) Take one tablet at bedtime  #90 x 3   Entered and Authorized by:   Cassandria Anger MD   Signed by:   Cassandria Anger MD on 02/21/2009   Method used:   Print then Give to Patient   RxID:   [id number] METFORMIN HCL 1000 MG TABS (METFORMIN HCL) Take 1 tablet by mouth two times a day  #180 x 3   Entered and Authorized by:   Cassandria Anger MD   Signed by:   Cassandria Anger MD on 02/21/2009   Method used:   Print then Give to Patient   RxID:   [id number] METOPROLOL SUCCINATE 50 MG  TB24 (METOPROLOL SUCCINATE) once daily  #90 x 3   Entered and Authorized by:   Cassandria Anger MD   Signed by:   Cassandria Anger MD on 02/21/2009   Method used:   Print then Give to Patient   RxID:   [id number] AMARYL 4 MG  TABS (GLIMEPIRIDE) 1 two times a day  #180 x 3    Entered and Authorized by:   Cassandria Anger MD   Signed by:   Cassandria Anger MD on 02/21/2009   Method used:   Print then Give to Patient   RxID:   [id number]

## 2010-02-21 NOTE — Letter (Signed)
Summary: W.Spencer Loyce Dys MD  W.Spencer Loyce Dys MD   Imported By: Phillis Knack 02/18/2009 13:45:06  _____________________________________________________________________  External Attachment:    Type:   Image     Comment:   External Document

## 2010-02-21 NOTE — Assessment & Plan Note (Signed)
Summary: follow up-lb   Vital Signs:  Patient profile:   70 year old male Height:      66 inches Weight:      204.50 pounds BMI:     33.13 O2 Sat:      98 % on Room air Temp:     97.3 degrees F oral Pulse rate:   73 / minute BP sitting:   110 / 66  (left arm) Cuff size:   large  Vitals Entered By: Ernestene Mention (Jun 01, 2009 8:21 AM)  O2 Flow:  Room air CC: Follow-up visit./kb Is Patient Diabetic? Yes Pain Assessment Patient in pain? no        CC:  Follow-up visit./kb.  History of Present Illness: The patient presents for a follow up of hypertension, diabetes, hyperlipidemia. C/o ED. C/o cough and phlegm...   Current Medications (verified): 1)  Amaryl 4 Mg  Tabs (Glimepiride) .Marland Kitchen.. 1 Two Times A Day 2)  Metoprolol Succinate 50 Mg  Tb24 (Metoprolol Succinate) .... Once Daily 3)  Metformin Hcl 1000 Mg Tabs (Metformin Hcl) .... Take 1 Tablet By Mouth Two Times A Day 4)  Simvastatin 40 Mg Tabs (Simvastatin) .... Take One Tablet At Bedtime 5)  Omeprazole 20 Mg Cpdr (Omeprazole) .... 2 A Day 6)  Verapamil Hcl Cr 180 Mg Tbcr (Verapamil Hcl) .Marland Kitchen.. 1 By Mouth Daily 7)  Vitamin D3 1000 Unit  Tabs (Cholecalciferol) .Marland Kitchen.. 1 Qd 8)  Vitamin B Complex   Caps (B Complex Vitamins) .... Once Daily 9)  Aspirin 81 Mg  Tbec (Aspirin) .... One By Mouth Every Day 10)  Benazepril-Hydrochlorothiazide 20-25 Mg Tabs (Benazepril-Hydrochlorothiazide) .Marland Kitchen.. 1 By Mouth Qd  Allergies (verified): No Known Drug Allergies  Past History:  Social History: Last updated: 11/12/2006 Retired Married, but separated Former Smoker Alcohol use-no Regular exercise-no  Past Medical History: Coronary artery disease Diabetes mellitus, type II Hypertension dm periph neuropathy glaucoma blindness due to DM thyroid nodule Cholelithiasis mi 1991 GERD ED  Review of Systems  The patient denies chest pain, syncope, and dyspnea on exertion.    Physical Exam  General:  overweight-appearing.   Eyes:  No  corneal or conjunctival inflammation noted. BLIND Nose:  External nasal examination shows no deformity or inflammation. Nasal mucosa are pink and moist without lesions or exudates. Mouth:  Oral mucosa and oropharynx without lesions or exudates.  Teeth in good repair. Lungs:  Normal respiratory effort, chest expands symmetrically. Lungs are clear to auscultation, no crackles or wheezes. Heart:  Normal rate and regular rhythm. S1 and S2 normal without gallop, murmur, click, rub or other extra sounds. Abdomen:  Bowel sounds positive,abdomen soft and non-tender without masses, organomegaly or hernias noted. Msk:  Using a cane Feet OK B Extremities:  No edema Neurologic:  ataxic gait.   Skin:  Intact without suspicious lesions or rashes Psych:  Cognition and judgment appear intact. Alert and cooperative with normal attention span and concentration.   Diabetes Management Exam:    Foot Exam (with socks and/or shoes not present):       Sensory-Pinprick/Light touch:          Left dorsal foot (L-5): diminished          Right dorsal foot (L-5): diminished       Sensory-Monofilament:          Left foot: diminished          Right foot: diminished       Nails:  Left foot: thickened          Right foot: thickened    Eye Exam:       Eye Exam done elsewhere          Date: 04/07/2008          Results: diabetic retinopathy   Impression & Recommendations:  Problem # 1:  GERD (ICD-530.81) Assessment Unchanged  His updated medication list for this problem includes:    Omeprazole 20 Mg Cpdr (Omeprazole) .Marland Kitchen... 2 a day  Problem # 2:  DIABETES MELLITUS, TYPE II (ICD-250.00) Assessment: Improved  The following medications were removed from the medication list:    Benazepril-hydrochlorothiazide 20-25 Mg Tabs (Benazepril-hydrochlorothiazide) .Marland Kitchen... 1 by mouth qd His updated medication list for this problem includes:    Amaryl 4 Mg Tabs (Glimepiride) .Marland Kitchen... 1 two times a day    Metformin Hcl  1000 Mg Tabs (Metformin hcl) .Marland Kitchen... Take 1 tablet by mouth two times a day    Aspirin 81 Mg Tbec (Aspirin) ..... One by mouth every day    Losartan Potassium-hctz 100-25 Mg Tabs (Losartan potassium-hctz) .Marland Kitchen... 1 by mouth once daily for blood pressure  Labs Reviewed: Creat: 1.9 (05/18/2009)    Reviewed HgBA1c results: 6.9 (05/18/2009)  7.9 (02/21/2009)  Orders: Podiatry Referral (Podiatry)  Problem # 3:  HYPERCHOLESTEROLEMIA (ICD-272.0) Assessment: Unchanged  His updated medication list for this problem includes:    Simvastatin 40 Mg Tabs (Simvastatin) .Marland Kitchen... Take one tablet at bedtime  Problem # 4:  CORONARY ARTERY DISEASE (ICD-414.00) Assessment: Unchanged  The following medications were removed from the medication list:    Benazepril-hydrochlorothiazide 20-25 Mg Tabs (Benazepril-hydrochlorothiazide) .Marland Kitchen... 1 by mouth qd His updated medication list for this problem includes:    Metoprolol Succinate 50 Mg Tb24 (Metoprolol succinate) ..... Once daily    Verapamil Hcl Cr 180 Mg Tbcr (Verapamil hcl) .Marland Kitchen... 1 by mouth daily    Aspirin 81 Mg Tbec (Aspirin) ..... One by mouth every day    Losartan Potassium-hctz 100-25 Mg Tabs (Losartan potassium-hctz) .Marland Kitchen... 1 by mouth once daily for blood pressure  Problem # 5:  COUGH (ICD-786.2) due to ACE Assessment: Deteriorated Will d/c  Problem # 6:  ERECTILE DYSFUNCTION, ORGANIC (ICD-607.84) Assessment: Deteriorated  His updated medication list for this problem includes:    Viagra 100 Mg Tabs (Sildenafil citrate) .Marland Kitchen... 1 by mouth once daily prn  Complete Medication List: 1)  Amaryl 4 Mg Tabs (Glimepiride) .Marland Kitchen.. 1 two times a day 2)  Metoprolol Succinate 50 Mg Tb24 (Metoprolol succinate) .... Once daily 3)  Metformin Hcl 1000 Mg Tabs (Metformin hcl) .... Take 1 tablet by mouth two times a day 4)  Simvastatin 40 Mg Tabs (Simvastatin) .... Take one tablet at bedtime 5)  Omeprazole 20 Mg Cpdr (Omeprazole) .... 2 a day 6)  Verapamil Hcl Cr 180 Mg  Tbcr (Verapamil hcl) .Marland Kitchen.. 1 by mouth daily 7)  Vitamin D3 1000 Unit Tabs (Cholecalciferol) .Marland Kitchen.. 1 qd 8)  Vitamin B Complex Caps (B complex vitamins) .... Once daily 9)  Aspirin 81 Mg Tbec (Aspirin) .... One by mouth every day 10)  Losartan Potassium-hctz 100-25 Mg Tabs (Losartan potassium-hctz) .Marland Kitchen.. 1 by mouth once daily for blood pressure 11)  Viagra 100 Mg Tabs (Sildenafil citrate) .Marland Kitchen.. 1 by mouth once daily prn  Patient Instructions: 1)  Please schedule a follow-up appointment in 3 months. 2)  BMP prior to visit, ICD-9: 3)  HbgA1C prior to visit, ICD-9:250.00 Prescriptions: VIAGRA 100 MG TABS (SILDENAFIL CITRATE) 1 by  mouth once daily prn  #12 x 6   Entered and Authorized by:   Cassandria Anger MD   Signed by:   Cassandria Anger MD on 06/01/2009   Method used:   Print then Give to Patient   RxID:   [id number] LOSARTAN POTASSIUM-HCTZ 100-25 MG TABS (LOSARTAN POTASSIUM-HCTZ) 1 by mouth once daily for blood pressure  #90 x 3   Entered and Authorized by:   Cassandria Anger MD   Signed by:   Cassandria Anger MD on 06/01/2009   Method used:   Print then Give to Patient   RxID:   [id number]

## 2010-02-21 NOTE — Progress Notes (Signed)
Summary: CALL  Phone Note Call from Patient Call back at Home Phone [phone]   Summary of Call: Pt's daughter is req a call back.  Initial call taken by: Charlsie Quest, [place],  August 10, 2009 11:07 AM  Follow-up for Phone Call        Spoke w/pt. He is requesting a list of all his meds and what they are for. Patient has apt on Monday the 25th. Explained that he needs to bring all medication bottles to office visit, including herbal supplements and otc's. After confirmation at office visit by MD a new list of meds would be printed and given to pt. He was very happy to bring his medications.  Follow-up by: Charlsie Quest, Marie,  August 10, 2009 4:23 PM  Additional Follow-up for Phone Call Additional follow up Details #1::        agree Thx

## 2010-02-21 NOTE — Assessment & Plan Note (Signed)
Summary: CPX/NWS   #   Vital Signs:  Patient profile:   70 year old male Height:      66 inches Weight:      207 pounds BMI:     33.53 Temp:     97.3 degrees F oral Pulse rate:   80 / minute Pulse rhythm:   regular Resp:     16 per minute BP sitting:   142 / 78  (left arm) Cuff size:   regular  Vitals Entered By: Jonathon Resides, Gabrielle Dare) (November 18, 2009 2:01 PM) CC: MWV Is Patient Diabetic? Yes  3  CC:  [facility].  History of Present Illness: The patient presents for a preventive health examination  Patient past medical history, social history, and family history reviewed in detail no significant changes.  Patient is physically active. Depression is negative and mood is good. Hearing is normal, and able to perform activities of daily living. Risk of falling is elevated and home safety has been reviewed and is appropriate. Patient has normal height, he is overweight, and he is blind. Patient has been counseled on age-appropriate routine health concerns for screening and prevention. Education, counseling done.  The patient presents for a follow up of hypertension, diabetes, hyperlipidemia   Preventive Screening-Counseling & Management  Alcohol-Tobacco     Alcohol drinks/day: 0     Smoking Status: quit     Year Quit: 1991  Caffeine-Diet-Exercise     Caffeine use/day: 1     Does Patient Exercise: no  Hep-HIV-STD-Contraception     Hepatitis Risk: no risk noted     Dental Visit-last 6 months no     Sun Exposure-Excessive: no  Safety-Violence-Falls     Seat Belt Use: yes     Helmet Use: n/a     Firearms in the Home: no firearms in the home     Smoke Detectors: yes     Violence in the Home: no risk noted     Sexual Abuse: no     Fall Risk: no  Current Medications (verified): 1)  Amaryl 4 Mg  Tabs (Glimepiride) .Marland Kitchen.. 1 Two Times A Day For Diabetes 2)  Metformin Hcl 1000 Mg Tabs (Metformin Hcl) .... Take 1 Tablet By Mouth Two Times A Day For Diabetes 3)  Metoprolol  Succinate 50 Mg  Tb24 (Metoprolol Succinate) .Marland Kitchen.. 1 Once Daily For Blood Pressure 4)  Verapamil Hcl Cr 180 Mg Tbcr (Verapamil Hcl) .Marland Kitchen.. 1 By Mouth Daily For Blood Pressure 5)  Omeprazole 20 Mg Cpdr (Omeprazole) .... 2 A Day For Stomach 6)  Losartan Potassium-Hctz 100-25 Mg Tabs (Losartan Potassium-Hctz) .Marland Kitchen.. 1 By Mouth Once Daily For Blood Pressure 7)  Lovastatin 20 Mg Tabs (Lovastatin) .Marland Kitchen.. 1 By Mouth Once Daily For Cholesterol 8)  Aspirin 81 Mg  Tbec (Aspirin) .... One By Mouth Every Day For Heart 9)  Viagra 100 Mg Tabs (Sildenafil Citrate) .Marland Kitchen.. 1 By Mouth Once Daily Prn 10)  Vitamin D3 1000 Unit  Tabs (Cholecalciferol) .Marland Kitchen.. 1 Qd 11)  Vitamin B Complex   Caps (B Complex Vitamins) .... 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 thyroid nodule Cholelithiasis mi 1991 GERD ED Elev PSA 2011 CTS L  Social History: Caffeine use/day:  1 Dental Care w/in 6 mos.:  no Sun Exposure-Excessive:  no Seat Belt Use:  yes Fall Risk:  no Hepatitis Risk:  no risk noted  Review of Systems  The patient denies anorexia, fever, weight loss, weight gain, vision loss, decreased hearing, hoarseness, chest pain, syncope, dyspnea on exertion, peripheral edema, prolonged cough, headaches, hemoptysis, abdominal pain, melena, hematochezia, severe indigestion/heartburn, hematuria, incontinence, genital sores, muscle weakness, suspicious skin lesions, difficulty walking, depression, unusual weight change, abnormal bleeding, enlarged lymph nodes, angioedema, and testicular masses.         blind  Physical Exam  General:  overweight-appearing.   Head:  Normocephalic and atraumatic without obvious  abnormalities. No apparent alopecia or balding. Eyes:  No corneal or conjunctival inflammation noted. BLIND Ears:  External ear exam shows no significant lesions or deformities.  Otoscopic examination reveals clear canals, tympanic membranes are intact bilaterally without bulging, retraction, inflammation or discharge. Hearing is grossly normal bilaterally. Nose:  External nasal examination shows no deformity or inflammation. Nasal mucosa are pink and moist without lesions or exudates. Mouth:  Oral mucosa and oropharynx without lesions or exudates.  Teeth in good repair. Neck:  No deformities, masses, or tenderness noted. Chest Wall:  No deformities, masses, tenderness or gynecomastia noted. Lungs:  Normal respiratory effort, chest expands symmetrically. Lungs are clear to auscultation, no crackles or wheezes. Heart:  Normal rate and regular rhythm. S1 and S2 normal without gallop, murmur, click, rub or other extra sounds. Abdomen:  Bowel sounds positive,abdomen soft and non-tender without masses, organomegaly or hernias noted. Rectal:  NE Prostate:  NE Msk:  Using a cane Feet OK B Neurologic:  ataxic gait.   Skin:  Intact without suspicious lesions or rashes Psych:  Cognition and judgment appear intact. Alert and cooperative with normal attention span and concentration.    Impression & Recommendations:  Problem # 1:  ROUTINE GENERAL MEDICAL EXAM@HEALTH  CARE FACL (ICD-V70.0) Assessment New Overall doing well, age appropriate education and counseling updated and referral for appropriate preventive services done unless declined, immunizations up to date or declined, diet counseling done if overweight, urged to quit smoking if smokes, most recent labs reviewed and current ordered if appropriate, ecg reviewed or declined (interpretation per ECG scanned in the EMR if done); information regarding Medicare Preventation requirements given if appropriate. Prostate exam was declined Orders: EKG w/  Interpretation (93000) Medicare -1st Annual Wellness Visit [phone]) no new changes  Problem # 2:  PSA, INCREASED (ICD-790.93) - BPH vs prostate CA Assessment: New Options discussed. The pt would like to hold off Urol cons and to repeat labs PSA/free PSA in 3 months  Start Proscar  Problem # 3:  CARPAL TUNNEL SYNDROME (ICD-354.0)  L Assessment: New  Orders: Splints- All Types [id number])  Problem # 4:  DIABETES MELLITUS, TYPE II (ICD-250.00) Assessment: Unchanged  His updated medication list for this problem includes:    Amaryl 4 Mg Tabs (Glimepiride) .Marland Kitchen... 1 two times a day for diabetes    Metformin Hcl 1000 Mg Tabs (Metformin hcl) .Marland Kitchen... Take 1 tablet by mouth two times a day for diabetes    Losartan Potassium-hctz 100-25 Mg Tabs (Losartan potassium-hctz) .Marland Kitchen... 1 by mouth once daily for blood pressure    Aspirin 81 Mg Tbec (Aspirin) ..... One by mouth every day for heart  Problem # 5:  HYPERTENSION (ICD-401.9) Assessment: Unchanged  His updated medication list for this problem includes:    Metoprolol Succinate 50 Mg Tb24 (Metoprolol succinate) .Marland Kitchen... 1 once daily for blood pressure    Verapamil Hcl Cr 180  Mg Tbcr (Verapamil hcl) .Marland Kitchen... 1 by mouth daily for blood pressure    Losartan Potassium-hctz 100-25 Mg Tabs (Losartan potassium-hctz) .Marland Kitchen... 1 by mouth once daily for blood pressure  Complete Medication List: 1)  Amaryl 4 Mg Tabs (Glimepiride) .Marland Kitchen.. 1 two times a day for diabetes 2)  Metformin Hcl 1000 Mg Tabs (Metformin hcl) .... Take 1 tablet by mouth two times a day for diabetes 3)  Metoprolol Succinate 50 Mg Tb24 (Metoprolol succinate) .Marland Kitchen.. 1 once daily for blood pressure 4)  Verapamil Hcl Cr 180 Mg Tbcr (Verapamil hcl) .Marland Kitchen.. 1 by mouth daily for blood pressure 5)  Omeprazole 20 Mg Cpdr (Omeprazole) .... 2 a day for stomach 6)  Losartan Potassium-hctz 100-25 Mg Tabs (Losartan potassium-hctz) .Marland Kitchen.. 1 by mouth once daily for blood pressure 7)  Lovastatin 20 Mg Tabs (Lovastatin) .Marland Kitchen.. 1  by mouth once daily for cholesterol 8)  Aspirin 81 Mg Tbec (Aspirin) .... One by mouth every day for heart 9)  Viagra 100 Mg Tabs (Sildenafil citrate) .Marland Kitchen.. 1 by mouth once daily prn 10)  Vitamin D3 1000 Unit Tabs (Cholecalciferol) .Marland Kitchen.. 1 qd 11)  Vitamin B Complex Caps (B complex vitamins) .... Once daily 12)  Proscar 5 Mg Tabs (Finasteride) .Marland Kitchen.. 1 by mouth once daily for prostate  Other Orders: Flu Vaccine 22yrs + MEDICARE PATIENTS [id number]) Administration Flu vaccine - MCR [id number])  Patient Instructions: 1)  Please schedule a follow-up appointment in 3 months. 2)  BMP prior to visit, ICD-9: 3)  HbgA1C prior to visit, ICD-9: 4)  PSA prior to visit, ICD-9: and free PSA 596.0 Prescriptions: PROSCAR 5 MG TABS (FINASTERIDE) 1 by mouth once daily for prostate  #90 x 3   Entered and Authorized by:   Cassandria Anger MD   Signed by:   Cassandria Anger MD on 11/18/2009   Method used:   Print then Give to Patient   RxID:   [id number]    Orders Added: 1)  EKG w/ Interpretation [93000] 2)  Splints- All Types [A4570] 3)  Flu Vaccine 11yrs + MEDICARE PATIENTS [Q2039] 4)  Administration Flu vaccine - MCR [id number] 5)  Medicare -1st Annual Wellness Visit [id number] 6)  Est. Patient Level IV [id number]           [email or web address]   Flu Vaccine Consent Questions     Do you have a history of severe allergic reactions to this vaccine? no    Any prior history of allergic reactions to egg and/or gelatin? no    Do you have a sensitivity to the preservative Thimersol? no    Do you have a past history of Guillan-Barre Syndrome? no    Do you currently have an acute febrile illness? no    Have you ever had a severe reaction to latex? no    Vaccine information given and explained to patient? yes    Are you currently pregnant? no    Lot Number:[id number]   Exp Date:07/22/2010   Site Given  Left Deltoid IM Jonathon Resides, [facility])  November 18, 2009 2:34 PM

## 2010-02-23 NOTE — Assessment & Plan Note (Signed)
Summary: 3 MTH FU---STC   Vital Signs:  Patient profile:   70 year old male Height:      66 inches Weight:      205 pounds BMI:     33.21 Temp:     98.5 degrees F oral Pulse rate:   76 / minute Pulse rhythm:   regular Resp:     16 per minute BP sitting:   100 / 58  (left arm) Cuff size:   regular  Vitals Entered By: Jonathon Resides, CMA(AAMA) (February 17, 2010 10:22 AM) CC: 3 mo f/u  Is Patient Diabetic? Yes   CC:  3 mo f/u .  History of Present Illness: The patient presents for a follow up of hypertension, diabetes, hyperlipidemia. No hypoglycemic symptoms    Current Medications (verified): 1)  Amaryl 4 Mg  Tabs (Glimepiride) .Marland Kitchen.. 1 Two Times A Day For Diabetes 2)  Metformin Hcl 1000 Mg Tabs (Metformin Hcl) .... Take 1 Tablet By Mouth Two Times A Day For Diabetes 3)  Metoprolol Succinate 50 Mg  Tb24 (Metoprolol Succinate) .Marland Kitchen.. 1 Once Daily For Blood Pressure 4)  Verapamil Hcl Cr 180 Mg Tbcr (Verapamil Hcl) .Marland Kitchen.. 1 By Mouth Daily For Blood Pressure 5)  Omeprazole 20 Mg Cpdr (Omeprazole) .... 2 A Day For Stomach 6)  Losartan Potassium-Hctz 100-25 Mg Tabs (Losartan Potassium-Hctz) .Marland Kitchen.. 1 By Mouth Once Daily For Blood Pressure 7)  Lovastatin 20 Mg Tabs (Lovastatin) .Marland Kitchen.. 1 By Mouth Once Daily For Cholesterol 8)  Aspirin 81 Mg  Tbec (Aspirin) .... One By Mouth Every Day For Heart 9)  Viagra 100 Mg Tabs (Sildenafil Citrate) .Marland Kitchen.. 1 By Mouth Once Daily Prn 10)  Vitamin D3 1000 Unit  Tabs (Cholecalciferol) .Marland Kitchen.. 1 Qd 11)  Vitamin B Complex   Caps (B Complex Vitamins) .... Once Daily 12)  Proscar 5 Mg Tabs (Finasteride) .Marland Kitchen.. 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         Eating less  Physical Exam  General:  overweight-appearing.   Eyes:  No corneal or conjunctival inflammation noted. BLIND Nose:  External nasal examination shows no deformity or inflammation. Nasal mucosa are pink and moist without lesions or exudates. Mouth:  Oral mucosa and oropharynx without lesions or exudates.  Teeth in good repair. Lungs:  Normal respiratory effort, chest expands symmetrically. Lungs are clear to auscultation, no crackles or wheezes. Heart:  Normal rate and regular rhythm. S1 and S2 normal without gallop, murmur, click, rub or other extra sounds. Abdomen:  Bowel sounds positive,abdomen soft and non-tender without masses, organomegaly or hernias noted. Msk:  Using a cane Feet OK B Neurologic:  ataxic gait.   Skin:  Intact without suspicious lesions or rashes Psych:  Cognition and judgment appear intact. Alert and cooperative with normal attention span and concentration.    Impression & Recommendations:  Problem # 1:  HYPERTENSION (ICD-401.9) Assessment Improved  His updated medication list for this problem includes:    Metoprolol Succinate 50 Mg Tb24 (Metoprolol succinate) .Marland Kitchen... 1 once daily for blood  pressure    Verapamil Hcl Cr 180 Mg Tbcr (Verapamil hcl) .Marland Kitchen... 1 by mouth daily for blood pressure    Losartan Potassium-hctz 100-25 Mg Tabs (Losartan potassium-hctz) .Marland Kitchen... 1 by mouth once daily for blood pressure  BP today: 100/58 Prior BP: 142/78 (11/18/2009)  Labs Reviewed: K+: 5.0 (02/10/2010) Creat: : 1.9 (02/10/2010)   Chol: 126 (11/15/2009)   HDL: 34.20 (11/15/2009)   LDL: 75 (11/15/2009)   TG: 84.0 (11/15/2009)  Problem # 2:  DIABETES MELLITUS, TYPE II (ICD-250.00) Assessment: Improved  The following medications were  removed from the medication list:    Amaryl 4 Mg Tabs (Glimepiride) .Marland Kitchen... 1 two times a day for diabetes His updated medication list for this problem includes:    Glimepiride 2 Mg Tabs (Glimepiride) .Marland Kitchen... 1 by mouth two times a day for diabetes    Metformin Hcl 1000 Mg Tabs (Metformin hcl) .Marland Kitchen... Take 1 tablet by mouth two times a day for diabetes    Losartan Potassium-hctz 100-25 Mg Tabs (Losartan potassium-hctz) .Marland Kitchen... 1 by mouth once daily for blood pressure    Aspirin 81 Mg Tbec (Aspirin) ..... One by mouth every day for heart  Labs Reviewed: Creat: 1.9 (02/10/2010)    Reviewed HgBA1c results: 6.8 (02/10/2010)  6.6 (11/15/2009)  Problem # 3:  CORONARY ARTERY DISEASE (ICD-414.00) Assessment: Unchanged  His updated medication list for this problem includes:    Metoprolol Succinate 50 Mg Tb24 (Metoprolol succinate) .Marland Kitchen... 1 once daily for blood pressure    Verapamil Hcl Cr 180 Mg Tbcr (Verapamil hcl) .Marland Kitchen... 1 by mouth daily for blood pressure    Losartan Potassium-hctz 100-25 Mg Tabs (Losartan potassium-hctz) .Marland Kitchen... 1 by mouth once daily for blood pressure    Aspirin 81 Mg Tbec (Aspirin) ..... One by mouth every day for heart  Problem # 4:  HYPERCHOLESTEROLEMIA (ICD-272.0) Assessment: Unchanged  His updated medication list for this problem includes:    Lovastatin 20 Mg Tabs (Lovastatin) .Marland Kitchen... 1 by mouth once daily for cholesterol  Labs Reviewed: SGOT: 16 (11/15/2009)   SGPT: 19 (11/15/2009)   HDL:34.20 (11/15/2009), 29.60 (05/18/2009)  LDL:75 (11/15/2009), 58 (05/18/2009)  Chol:126 (11/15/2009), 104 (05/18/2009)  Trig:84.0 (11/15/2009), 83.0 (05/18/2009)  Problem # 5:  PSA, INCREASED (ICD-790.93) Assessment: Improved PSA and free PSA results w/a prostate Ca risk at 55%. Options discussed. Total PSA was better than one in Oct 2011 He would like to recheck it in 3 months   Complete Medication List: 1)  Glimepiride 2 Mg Tabs (Glimepiride) .Marland Kitchen.. 1 by mouth two times a day for  diabetes 2)  Metformin Hcl 1000 Mg Tabs (Metformin hcl) .... Take 1 tablet by mouth two times a day for diabetes 3)  Metoprolol Succinate 50 Mg Tb24 (Metoprolol succinate) .Marland Kitchen.. 1 once daily for blood pressure 4)  Verapamil Hcl Cr 180 Mg Tbcr (Verapamil hcl) .Marland Kitchen.. 1 by mouth daily for blood pressure 5)  Omeprazole 20 Mg Cpdr (Omeprazole) .... 2 a day for stomach 6)  Losartan Potassium-hctz 100-25 Mg Tabs (Losartan potassium-hctz) .Marland Kitchen.. 1 by mouth once daily for blood pressure 7)  Lovastatin 20 Mg Tabs (Lovastatin) .Marland Kitchen.. 1 by mouth once daily for cholesterol 8)  Aspirin 81 Mg Tbec (Aspirin) .... One by mouth every day for heart 9)  Viagra 100 Mg Tabs (Sildenafil citrate) .Marland Kitchen.. 1 by mouth once daily prn 10)  Vitamin D3 1000 Unit Tabs (Cholecalciferol) .Marland Kitchen.. 1 qd 11)  Vitamin B Complex Caps (B complex vitamins) .... Once daily 12)  Proscar 5 Mg Tabs (Finasteride) .Marland KitchenMarland KitchenMarland Kitchen 1  by mouth once daily for prostate  Patient Instructions: 1)  Please schedule a follow-up appointment in 3 months. 2)  BMP prior to visit, ICD-9: 3)  HbgA1C prior to visit, ICD-9:250.00 4)  PSA and free PSA 790.93 Prescriptions: GLIMEPIRIDE 2 MG TABS (GLIMEPIRIDE) 1 by mouth two times a day for diabetes  #60 x 12   Entered and Authorized by:   Cassandria Anger MD   Signed by:   Cassandria Anger MD on 02/17/2010   Method used:   Print then Give to Patient   RxID:   [id number]    Orders Added: 1)  Est. Patient Level IV [id number]

## 2010-05-15 ENCOUNTER — Other Ambulatory Visit (INDEPENDENT_AMBULATORY_CARE_PROVIDER_SITE_OTHER): Payer: Medicare HMO

## 2010-05-15 ENCOUNTER — Other Ambulatory Visit: Payer: Self-pay | Admitting: Internal Medicine

## 2010-05-15 DIAGNOSIS — R972 Elevated prostate specific antigen [PSA]: Secondary | ICD-10-CM

## 2010-05-15 DIAGNOSIS — E119 Type 2 diabetes mellitus without complications: Secondary | ICD-10-CM

## 2010-05-15 DIAGNOSIS — Z125 Encounter for screening for malignant neoplasm of prostate: Secondary | ICD-10-CM

## 2010-05-15 LAB — BASIC METABOLIC PANEL
BUN: 30 mg/dL — ABNORMAL HIGH (ref 6–23)
CO2: 25 mEq/L (ref 19–32)
Calcium: 9.4 mg/dL (ref 8.4–10.5)
GFR: 48.12 mL/min — ABNORMAL LOW (ref >60.00)
Glucose, Bld: 99 mg/dL (ref 70–99)

## 2010-05-16 LAB — PSA, FREE: PSA, Free: 0.4 ng/mL

## 2010-05-19 ENCOUNTER — Ambulatory Visit (INDEPENDENT_AMBULATORY_CARE_PROVIDER_SITE_OTHER): Payer: Medicare HMO | Admitting: Internal Medicine

## 2010-05-19 ENCOUNTER — Encounter: Payer: Self-pay | Admitting: Internal Medicine

## 2010-05-19 DIAGNOSIS — E119 Type 2 diabetes mellitus without complications: Secondary | ICD-10-CM

## 2010-05-19 DIAGNOSIS — R42 Dizziness and giddiness: Secondary | ICD-10-CM

## 2010-05-19 DIAGNOSIS — H4050X0 Glaucoma secondary to other eye disorders, unspecified eye, stage unspecified: Secondary | ICD-10-CM

## 2010-05-19 DIAGNOSIS — R972 Elevated prostate specific antigen [PSA]: Secondary | ICD-10-CM

## 2010-05-19 DIAGNOSIS — K219 Gastro-esophageal reflux disease without esophagitis: Secondary | ICD-10-CM

## 2010-05-19 DIAGNOSIS — H409 Unspecified glaucoma: Secondary | ICD-10-CM

## 2010-05-19 NOTE — Patient Instructions (Signed)
Use Head and Shoulders shampoo

## 2010-05-19 NOTE — Assessment & Plan Note (Signed)
  Lab Results  Component Value Date   WBC 7.2 11/15/2009   HGB 14.0 11/15/2009   HCT 42.2 11/15/2009   PLT 225.0 11/15/2009   CHOL 126 11/15/2009   TRIG 84.0 11/15/2009   HDL 34.20* 11/15/2009   ALT 19 11/15/2009   AST 16 11/15/2009   NA 136 05/15/2010   K 5.2* 05/15/2010   CL 102 05/15/2010   CREATININE 1.8* 05/15/2010   BUN 30* 05/15/2010   CO2 25 05/15/2010   TSH 1.41 11/15/2009   PSA 4.04* 05/15/2010   HGBA1C 6.9* 05/15/2010   On Rx

## 2010-05-19 NOTE — Progress Notes (Signed)
  Subjective:    Patient ID: Caleb Dunn, male    DOB: Feb 06, 1940, 70 y.o.   MRN: [id number]  HPI  The patient presents for a follow-up of  chronic hypertension, chronic dyslipidemia, type 2 diabetes controlled with medicines    Review of Systems  Constitutional: Negative for fever and fatigue.  Eyes: Negative for pain and itching. Visual disturbance: blind.  Respiratory: Negative for cough.   Cardiovascular: Negative for leg swelling.  Genitourinary: Negative for testicular pain.  Musculoskeletal: Negative for joint swelling.  Psychiatric/Behavioral: Negative for confusion.       Objective:   Physical Exam  Constitutional: He is oriented to person, place, and time. He appears well-developed.  HENT:  Mouth/Throat: Oropharynx is clear and moist.  Eyes: Conjunctivae are normal. Pupils are equal, round, and reactive to light. Right eye exhibits no discharge. Left eye exhibits no discharge. No scleral icterus.       R eyeball firm, NT Blind  Neck: Normal range of motion. No JVD present. No thyromegaly present.  Cardiovascular: Normal rate, regular rhythm, normal heart sounds and intact distal pulses.  Exam reveals no gallop and no friction rub.   No murmur heard. Pulmonary/Chest: Effort normal and breath sounds normal. No respiratory distress. He has no wheezes. He has no rales. He exhibits no tenderness.  Abdominal: Soft. Bowel sounds are normal. He exhibits no distension and no mass. There is no tenderness. There is no rebound and no guarding.  Musculoskeletal: Normal range of motion. He exhibits no edema and no tenderness.  Lymphadenopathy:    He has no cervical adenopathy.  Neurological: He is alert and oriented to person, place, and time. He has normal reflexes. No cranial nerve deficit. He exhibits normal muscle tone. Coordination normal.  Skin: Skin is warm and dry. No rash noted.  Psychiatric: He has a normal mood and affect. His behavior is normal. Judgment and thought  content normal.          Assessment & Plan:  DIABETES MELLITUS, TYPE II  Lab Results  Component Value Date   WBC 7.2 11/15/2009   HGB 14.0 11/15/2009   HCT 42.2 11/15/2009   PLT 225.0 11/15/2009   CHOL 126 11/15/2009   TRIG 84.0 11/15/2009   HDL 34.20* 11/15/2009   ALT 19 11/15/2009   AST 16 11/15/2009   NA 136 05/15/2010   K 5.2* 05/15/2010   CL 102 05/15/2010   CREATININE 1.8* 05/15/2010   BUN 30* 05/15/2010   CO2 25 05/15/2010   TSH 1.41 11/15/2009   PSA 4.04* 05/15/2010   HGBA1C 6.9* 05/15/2010   On Rx  GERD On rx  Glaucoma Ophth appt pending

## 2010-05-19 NOTE — Assessment & Plan Note (Signed)
On rx 

## 2010-05-19 NOTE — Assessment & Plan Note (Signed)
Ophth appt pending

## 2010-05-19 NOTE — Assessment & Plan Note (Signed)
Watching PSA 

## 2010-05-19 NOTE — Assessment & Plan Note (Signed)
Ophth f/u pending

## 2010-06-06 NOTE — Consult Note (Signed)
[facility]                          ENDOCRINOLOGY CONSULTATION   NAME:Caleb Dunn, Caleb Dunn                      MRN:          [id number]  DATE:07/30/2006                            DOB:          04/17/1940    REFERRING PHYSICIAN:  Dr. Alain Marion.   REASON FOR REFERRAL:  Thyroid nodule.   HISTORY OF PRESENT ILLNESS:  A 70 year old man who states in the 1970s  he had removal of a nodule from his neck and that the pathological  result was benign.  He was noted recently to have encountered a  difficult endotracheal intubation, and the question was raised of a  possible thyroid nodule.   PAST MEDICAL HISTORY:  1. Type 2 diabetes.  2. Hypertension.  3. Diabetes neuropathy.  4. Glaucoma.  5. CAD.  6. Legal blindness.  7. Vitamin D deficiency.   SOCIAL HISTORY:  He is retired.  He is here with his son.   FAMILY HISTORY:  The patient's son states he has an uncertain type of  thyroid condition.   REVIEW OF SYSTEMS:  Denies difficulty swallowing.   PHYSICAL EXAMINATION:  Blood pressure 150/78, heart rate 80, temperature  97.0, weight 220.  GENERAL:  No distress.  NECK:  I do not appreciate any nodule.  NEUROLOGIC:  Alert and oriented, does not appear anxious nor depressed.   LABORATORY STUDIES:  TSH on May 14, 2006 is normal at 1.19.  Thyroid  ultrasound shows a 12 mm left-sided thyroid nodule.   IMPRESSION:  1. Thyroid nodule with a history of probable resection of another      benign thyroid nodule in the 1970s.  2. Euthyroid.  3. Other serious chronic medical problems as noted above which would      make him a risky surgical candidate.  4. History of difficult endotracheal intubation, very unlikely to be      thyroid related.   PLAN:  I discussed the risks of this nodule and after this discussion,  he states he wishes to decline a ultrasound guided biopsy and instead  follow this clinically, so he is advised to return in 6 months.     Sean A. Loanne Drilling, MD  Electronically Signed    SAE/MedQ  DD: 08/04/2006  DT: 08/05/2006  Job #: [id number]   cc:   Evie Lacks. Plotnikov, MD

## 2010-06-09 NOTE — Discharge Summary (Signed)
NAMEKARLE, MASSE NO.:  [id number]   MEDICAL RECORD NO.:  [id number]          PATIENT TYPE:  INP   LOCATION:  3705                         FACILITY:  [facility]   PHYSICIAN:  Ezzard Standing, M.D.DATE OF BIRTH:  02-08-1940   DATE OF ADMISSION:  02/12/2005  DATE OF DISCHARGE:  02/13/2005                                 DISCHARGE SUMMARY   FINAL DIAGNOSES:  1.  Chest pain of uncertain etiology, myocardial function ruled out.  2.  Coronary artery disease with previous bypass grafting and previous      inferior infarct.  3.  Type 2 diabetes.  4.  Hypertension.  5.  Hyperlipidemia.  6.  Obesity.  7.  Glaucoma.  8.  Possible hiatal hernia or reflux.   HISTORY:  The patient is a 70 year old black male with previous coronary  bypass grafting. He has been stable but presented to emergency room with a  three-day history of epigastric discomfort. He stated that it had some  features suggesting like his previous heart attack but other features  suggesting indigestion. He had taken a purple pill with some relief. He had  a significant episode occurring at rest the night of admission and came to  the emergency room where EKG was unremarkable. He was admitted to rule out  myocardial infarction.   HOSPITAL COURSE:  The patient had negative cardiac enzymes on admission.  Serial enzymes were negative. EKG was unremarkable. He was continued on his  other medications and was ambulatory in the hall without further pain.  Because of the atypical nature of the pain, he was placed on Protonix 40  milligrams b.i.d. since his EKG remained unchanged. It was recommended he be  discharged to continue his current medications with the addition of Protonix  40 milligrams b.i.d. and will be seen for an outpatient stress Cardiolite in  the next several days.   DISCHARGE MEDICATIONS:  1.  Aspirin 81 milligrams daily.  2.  Amitriptyline 10 milligrams q.h.s.  3.  Norvasc 10 milligrams  daily.  4.  Benazepril 20 milligrams daily.  5.  Glyburide 5 milligrams q.h.s.  6.  Avapro 150 milligrams daily.  7.  Lopressor 50 milligrams b.i.d.  8.  Avandia 4 milligrams daily.  9.  Zocor 20 milligrams daily.  10. He is also to be on Protonix 40 milligrams b.i.d.   He is to follow-up for a Cardiolite test in 1-2 days and he is to call if  there are problems.      Ezzard Standing, M.D.  Electronically Signed     WST/MEDQ  D:  02/13/2005  T:  02/13/2005  Job:  [id number]   cc:   Osvaldo Human, M.D.  Fax: [phone]

## 2010-06-09 NOTE — Op Note (Signed)
NAMEHALSEY, CRUMEDY NO.:  [id number]   MEDICAL RECORD NO.:  [id number]          PATIENT TYPE:  AMB   LOCATION:  DAY                          FACILITY:  [facility]   PHYSICIAN:  Marland Kitchen T. Hoxworth, M.D.DATE OF BIRTH:  1940-07-29   DATE OF PROCEDURE:  DATE OF DISCHARGE:                                 OPERATIVE REPORT   PREOPERATIVE DIAGNOSES:  Cholelithiasis and cholecystitis.   POSTOPERATIVE DIAGNOSES:  Cholelithiasis and cholecystitis.   SURGICAL PROCEDURES:  Laparoscopic cholecystectomy with intraoperative  cholangiogram.   SURGEON:  Marland Kitchen T. Hoxworth, M.D.   ASSISTANT:  Fenton Malling. Lucia Gaskins, M.D.   ANESTHESIA:  General.   BRIEF HISTORY:  Mr. Hollrah is a 70 year old male who presents with episodic  right upper quadrant abdominal pain. This has been persistent over a period  of time and a gallbladder ultrasound has been obtained showing multiple  small gallstones. He is felt to have symptomatic cholelithiasis and  laparoscopic cholecystectomy has been recommended and accepted. The nature  of the procedure, indications, risks of bleeding, infection, bile leak, bile  duct injury, cardiorespiratory complications and failure to relieve her  symptoms have been discussed and understood. He is now brought to the  operating room for this procedure.   DESCRIPTION OF PROCEDURE:  The patient was brought to the operating room,  placed in supine position on the operating table and general endotracheal  anesthesia was induced. The abdomen was widely sterilely prepped and draped.  PAS were in place. He received broad spectrum preoperative antibiotics.  Local anesthesia was used to infiltrate the trocar sites prior to the  incision. A 1 cm incision was made at the umbilicus and dissection carried  down to the midline fascia which was sharply incised for 1 cm and the  peritoneum entered under direct vision. Through a mattress suture of Vicryl,  the Hasson trocar was  placed and pneumoperitoneum established. Under direct  vision, a 10 mm trocar was placed in the subxiphoid area and two 5 mm  trocars along the right subcostal margin. The gallbladder was exposed and  appeared somewhat thickened but not severely inflamed. The fundus was  grasped and elevated up over the liver and the infundibulum retracted  inferolaterally. Fibrofatty tissue was stripped down off the neck of the  gallbladder toward the porta hepatis and Calot's triangle thoroughly  dissected. The cystic artery and cystic duct were identified and the cystic  duct gallbladder junction was dissected 360 degrees. When the anatomy  appeared completely clear, the cystic duct was clipped at the junction of  the gallbladder and the cystic artery was also clipped. Operative  cholangiogram was then obtained through the cystic duct which showed good  filling of a normal-sized common bile duct and free flow into the duodenum.  The trocar obscured some of the intrahepatic ducts but there was clearly  branching of the left and right major ductal system. Following this, the  cholangiocath was removed and the cystic duct was doubly clipped proximally  and divided, the cystic artery was further clipped proximally, distally and  divided. The gallbladder was then dissected free from its  bed using hook  cautery and removed intact through the umbilicus. The right upper quadrant  was inspected for hemostasis which appeared complete. Trocars removed and  all CO2 evacuated. The mattress suture was secured at the umbilicus. The  skin incisions were closed with interrupted subcuticular of 4-0 Monocryl and  Steri-Strips.   Sponge, needle and instrument counts were correct. Dry sterile dressings  were applied and the patient was taken to recovery in good condition.      Darene Lamer. Hoxworth, M.D.  Electronically Signed     BTH/MEDQ  D:  11/06/2004  T:  11/06/2004  Job:  [id number]

## 2010-06-09 NOTE — Op Note (Signed)
[place]. [facility]  Patient:    Caleb Dunn, Caleb Dunn                      MRN: [id number] Proc. Date: 04/29/00 Adm. Date:  [id number] Attending:  Valla Leaver CC:         Kerry Hough., M.D.  Cath lab   Operative Report  PREOPERATIVE DIAGNOSIS:  Severe three-vessel coronary artery disease with unstable angina.  POSTOPERATIVE DIAGNOSIS:  Severe three-vessel coronary artery disease with unstable angina.  OPERATION PERFORMED:  Median sternotomy, extracorporeal circulation, coronary artery bypass graft surgery x 5 using a left internal mammary artery graft to the left anterior descending coronary artery, with a saphenous vein graft to the intermediate coronary artery, a saphenous vein graft to the obtuse marginal branch of the left circumflex coronary artery, and a sequential saphenous vein graft to the posterior descending and posterolateral branches of the right coronary artery.  SURGEON:  Gaye Pollack, M.D.  ASSISTANT: [place]. Roxan Hockey, M.D. 2. Ollen Bowl, P.A.  ANESTHESIA:  General endotracheal.  INDICATIONS FOR PROCEDURE:  The patient is a 59% gentleman with a past history of coronary disease, status post myocardial infarction in 1991 and angioplasty at that time of the right coronary artery.  He subsequently had restenosis requiring a second intervention.  Approximately three weeks ago he began having prolonged episodes of chest pain with exertion relieved with rest.  He presented to the [facility] Emergency Department with a prolonged episode of chest pain relieved with nitroglycerin and heparin. Cardiac catheterization by Dr. Wynonia Lawman showed severe three-vessel disease.  The LAD had a 70% midvessel stenosis.  The intermediate had 60% proximal stenosis. The left circumflex gave off a small to medium sized obtuse marginal that had 90% stenosis.  The right coronary artery was diffusely diseased with 30  to 40% lesions and the posterior descending and posterolateral branches were diffusely diseased with 50% lesions.  Left ventricular ejection fraction was about 50% with inferior hypokinesis.   After review of the angiograms and examination of the patient, it was felt that coronary artery bypass surgery was the best treatment.  I discussed the operative procedure with the patient and his wife, including alternatives to surgery, benefits, and risks including bleeding, possible blood transfusion, infection, stroke, myocardial infarction, and death.  They understood and agreed to proceed with surgery.  DESCRIPTION OF PROCEDURE:  The patient was taken to the operating room and placed on the table in supine position.  After induction of general endotracheal anesthesia, a Foley catheter was placed in the bladder using sterile technique.  Then the chest, abdomen and both lower extremities were prepped and draped in the usual sterile manner.  The chest was entered through a median sternotomy incision and the pericardium opened in the midline. Examination of the heart showed good ventricular contractility.  The ascending aorta had no palpable plaques in it.  Then the left internal mammary artery was harvested from the chest wall as a pedicle graft.  This was a medium caliber vessel with excellent blood flow through it.  At the same time a segment of greater saphenous vein was harvested from the right leg and this vein was of medium size and good quality.  Then the patient was heparinized and when an adequate activated clotting time was achieved, the distal ascending aorta was cannulated using a 6.5 mm aortic cannula for arterial inflow.  Venous outflow was achieved using a two-stage venous  cannula through the right atrial appendage.  An antegrade cardioplegia and vent cannula was inserted into the aortic root.  The patient was placed on cardiopulmonary bypass and the distal coronary arteries  were identified.  The LAD was an intramyocardial vessel along its mid and proximal thirds.  It surfaced in the distal third.  There was some plaque present distally in the vessel.  The intermediate was an intramyocardial vessel that was heavily diseased in its proximal portion.  It was dissected from the muscle and was suitable for grafting.  The obtuse marginal was a small to medium-size vessel but was suitable for grafting.  The right coronary artery was diffusely diseased with plaque extending out into the proximal portions of the posterior descending and posterolateral branches.  There was patchy plaque out in the proximal and midportions of the posterior descending and posterolateral branch.  Then the aorta was cross-clamped and 500 cc of cold blood antegrade cardioplegia was administered in the aortic root with quick arrest of the heart.  Systemic hypothermia to 20 degrees centigrade and topical hypothermia with iced saline was used.  A temperature probe was placed in the septum and an insulating pad in the pericardium.  The first distal anastomosis was performed to the obtuse marginal branch.  The internal diameter was 1.6 mm.  The conduit used was a segment of greater saphenous vein.  Anastomosis was performed in end-to-side manner using continuous 7-0 Prolene suture.  The flow was measured through the graft and was excellent.  The second distal anastomosis was performed to posterior descending coronary artery.  The internal diameter was about 1.5 mm.  The conduit used was a second segment of greater saphenous vein.  The anastomosis was performed in a sequential side-to-side manner using continuous 7-0 Prolene suture.  The flow was measured through the graft and was excellent.  The third distal anastomosis was performed to the posterolateral branch.  The internal diameter was about 1.4 mm.  The conduit used was the same segment of greater saphenous vein.  Anastomosis was  performed in a sequential end-to-side  manner using continuous 8-0 Prolene suture.  The flow was measured through the graft and was excellent.  Then another dose of cardioplegia was given down the vein grafts and into the aortic root.  Then the fourth distal anastomosis was performed to the intermediate coronary artery.  The internal diameter was 1.75 mm.  The conduit used was a third segment of greater saphenous vein.  The anastomosis was performed in an end-to-side manner using continuous 7-0 Prolene suture.  The flow was measured through the graft and was excellent.  The fifth distal anastomosis was performed to the distal portion of the left anterior descending coronary artery.  The internal diameter was 1.6 mm.  The conduit used was the left internal mammary artery graft and this was brought through an opening in the left pericardium anterior to the phrenic nerve.  It was anastomosed to the LAD in end-to-side manner using continuous 8-0 Prolene suture.  The pedicle was tacked to the epicardium using 6-0 Prolene sutures. The patient was rewarmed to 37 degrees centigrade and the clamp removed from the mammary pedicle.  There was rapid warming of the ventricular septum and return of spontaneous ventricular fibrillation.  The crossclamp was removed with a time of 62 minutes and the patient defibrillated into sinus rhythm.  A partial occlusion clamp was placed on the aortic root and the three proximal vein graft anastomoses were performed in end-to-side manner using  continuous 6-0 Prolene suture.  The clamps were removed, the vein grafts deaired and the clamps removed from them.  The proximal and distal anastomoses appeared hemostatic and the line of the grafts satisfactory.  Graft markers were placed around the proximal anastomoses.  Two temporary right ventricular and right atrial pacing wires were placed and brought out through the skin.  When the patient had rewarmed to 37 degrees  centigrade, he was weaned from cardiopulmonary bypass on no inotropic agents.  Total bypass time was 115 minutes.  Cardiac function appeared excellent with a cardiac output of 6 to 7L per minute.  Protamine was given and the venous and aortic cannulas were removed without difficulty.  Hemostasis was achieved.  Three chest tubes were placed with a tube in the posterior pericardium and one in the left pleural space and one in the anterior mediastinum.  The pericardium was reapproximated over the heart.  The sternum was closed with #6 stainless steel wires.  The fascia was closed with continuous #1 Vicryl suture.  The subcutaneous tissues were closed using continuous 2-0 Vicryl and the skin with 3-0 Vicryl subcuticular closure.  The lower extremity vein harvest site was closed in layers in a similar manner.  The sponge, needle and instrument counts were correct according to the scrub nurse.  A dry sterile dressing was applied over the incisions and around the chest tubes which were hooked to Pleur-evac suction.  The patient remained hemodynamically stable and was transported to the SICU in guarded but stable condition. DD:  04/29/00 TD:  04/29/00 Job: [id number]

## 2010-06-09 NOTE — H&P (Signed)
Caleb Dunn, REVEAL NO.:  [id number]   MEDICAL RECORD NO.:  [id number]          PATIENT TYPE:  INP   LOCATION:  1846                         FACILITY:  [place] [facility]   PHYSICIAN:  Ezzard Standing, M.D.DATE OF BIRTH:  1940/05/06   DATE OF ADMISSION:  02/12/2005  DATE OF DISCHARGE:                                HISTORY & PHYSICAL   HISTORY:  A 70 year old black male who was admitted to the hospital for  evaluation of chest discomfort.  The patient has a history of coronary  artery disease with a previous inferior infarction treated with angioplasty.  He has had diabetes for years.  He has had significant chest discomfort and  was admitted to the hospital and found to have three-vessel disease in April  2002.  He eventually underwent coronary artery bypass grafting with internal  mammary graft to the LAD, a vein graft to the intermediate, a vein graft to  the circumflex, and a sequential vein graft to the posterior descending and  posterior lateral branch.  He has done well since that time, but has had  difficulty with glaucoma.  He is a known diabetic.  He had a cholecystectomy  in the past six months and developed lower sternal epigastric pain occurring  at rest over the past two to three days.  The pain would wax and wane,  resemble his previous heart attack, but he also thought he might have some  indigestion.  He took a purple pill which may have helped transiently for  pain, but had more severe onset of the discomfort he stated with some  radiation up into his chest and he came to the emergency room. He states the  pain is similar to his previous heart attack.  He came to the emergency room  where he had only a single set of cardiac enzymes that were negative, as  well as an EKG, and I was asked to see him.  He is admitted at this time to  rule out a MI.   PAST MEDICAL HISTORY:  1.  History of diabetes with retinopathy and peripheral neuropathy.  2.   Hypertension for many years.  3.  Hyperlipidemia also previously.   PAST SURGICAL HISTORY:  1.  Removal of growth from neck.  2.  Cholecystectomy.  3.  Several eye surgeries for glaucoma.  4.  Previous coronary artery bypass grafting.   ALLERGIES:  No known drug allergies.   CURRENT MEDICATIONS:  1.  Amaryl 4 mg daily.  2.  Benazepril 20 mg daily.  3.  Amitriptyline 10 mg daily.  4.  Eye drops for glaucoma.  5.  He also has been on Simvastatin 20 mg daily.  6.  Norvasc 10 mg daily.  7.  Avandia 4 mg daily.  8.  Glyburide 5 mg daily.   FAMILY HISTORY:  Father died of cirrhosis, mother died of complications of  diabetes in her 61s.  Sister died of complications of diabetes.  One sister  has hypertension.   SOCIAL HISTORY:  He is a retired [employer] manager.  He has been married three times.  ___________alcohol.  Quit smoking in 1991.  Has been married to his current  wife for around 48 years.   REVIEW OF SYSTEMS:  History of glaucoma and poor vision.  He has had  diabetic retinopathy.  He has no difficulty with hearing or swallowing.  There is no history of ulcers, but has had some previous abdominal  discomfort.  He has previous impotence.  He has numbness involving his feet.   PHYSICAL EXAMINATION:  GENERAL:  He is a pleasant, elderly black male in no  acute distress who is moderately obese.  VITAL SIGNS:  His blood pressure is currently 130/70, pulse was 88.  SKIN:  Warm and dry.  HEENT:  No adenopathy.  __________clear.  Fundi not examined.  Pharynx  negative.  NECK:  Supple without masses, JVD, thyromegaly, or bruits.  LUNGS:  Clear to A&P with a median sternotomy scar noted.  CARDIAC:  Normal S1 and S2, no S3, S4, or murmur.  ABDOMEN:  Soft, nontender, no aneurysm is noted.  EXTREMITIES:  Femoral and distal pulses are 2+, no bruits.   LABORATORY DATA:  EKG shows previous infarct.  No ST changes.   IMPRESSION AND PLAN:  1.  Chest discomfort and abdominal discomfort, rule out  unstable angina.  2.  Coronary artery disease with previous inferior infarction and previous      bypass grafting.  3.  Diabetes mellitus, non-insulin dependent, with retinopathy and      neuropathy.  4.  Peripheral neuropathy and retinopathy thought to be due to diabetes.  5.  Obesity.  6.  History of glaucoma.   RECOMMENDATIONS:  A somewhat difficult history.  Recommend chest x-ray,  recommend additional cardiac enzymes.  Begin high-dose Protonix.  Serial  EKG's.      Ezzard Standing, M.D.  Electronically Signed     WST/MEDQ  D:  02/12/2005  T:  02/12/2005  Job:  [id number]   cc:   Evie Lacks. Plotnikov, M.D. [facility]  [address]  [place] [address]

## 2010-06-09 NOTE — Cardiovascular Report (Signed)
[place]. [facility]  Patient:    Caleb Dunn, Caleb Dunn                      MRN: [id number] Proc. Date: 04/26/00 Adm. Date:  [id number] Attending:  Monico Blitz CC:         Walker Kehr, M.D. [facility]   Cardiac Catheterization  PROCEDURES PERFORMED:  Cardiac catheterization.  INDICATIONS:  Unstable angina pectoris in a patient with previous coronary artery disease.  DISPOSITION:  The patient tolerated the procedure well without complications and had good hemostasis following the procedure. He was moved to [facility] following the completion of the procedure.  MEDICATIONS/CATHETERS:  Please see the attached catheterization log for medicines used and catheters used.  HEMODYNAMIC DATA: 1. The aorta post-contrast 171/87 mmHg. 2. Left ventricular post-contrast 171/20 mmHg.  ANGIOGRAPHIC DATA:  LEFT VENTRICULOGRAM: 1. Performed in the 30 degree RAO projection. 2. The aortic valve is normal. 3. The mitral valve is normal. 4. The left ventricle appears normal in size. 5. There is mild to moderate inferior hypokinesis present with    an estimated ejection fraction of 50%.  CORONARY ARTERIOGRAPHY: 1. The coronary arteries arise and distribute normally. Some    calcification is present proximally. 2. Left main coronary artery:  The left main coronary artery appears    normal. 3. Left anterior descending artery: The left anterior descending artery    has a segmental 70% mid-vessel stenosis just after the diagonal    branch. There is some moderate distal disease at the apex. The distal    vessel appears to be graftable. 4. Left circumflex coronary artery:  The circumflex supplies a large    intermediate branch. There is an eccentric 60% somewhat segmental    lesion in the mid-portion of the vessel with an enlarged distal    vessel. The circumflex continuation branch is severely diseased    with a segmental 90% stenosis and another tandem 90%  stenosis.    The distal vessel is only small in size. 5. Right coronary artery: The right coronary artery is a large    dominant vessel. There is a long area of segmental 50% narrowing    in the mid-portion of the vessel. There is mild distal disease    present in the posterior descending artery and the posterolateral    branch has a long segmental 50% stenoses noted.  IMPRESSION: 1. Abnormal left ventricular function with preserved ejection fraction    of 50%, but inferior wall hypokinesis. 2. Significant three-vessel coronary atherosclerotic heart disease.  RECOMMENDATIONS:  Coronary artery bypass grafting in this diabetic. DD:  04/26/00 TD:  04/26/00 Job: [id number]

## 2010-06-09 NOTE — Op Note (Signed)
NAMELATRELL, Dunn NO.:  [id number]   MEDICAL RECORD NO.:  [id number]          PATIENT TYPE:  OIB   LOCATION:  2899                         FACILITY:  [facility]   PHYSICIAN:  Clent Demark. Rankin, M.D.   DATE OF BIRTH:  1940/11/12   DATE OF PROCEDURE:  05/01/2004  DATE OF DISCHARGE:                                 OPERATIVE REPORT   PREOPERATIVE DIAGNOSES:  1.  Secondary glaucoma, right eye.  2.  Retained silicone oil implant, right eye, causing secondary glaucoma,      right eye.   POSTOPERATIVE DIAGNOSES:  1.  Secondary glaucoma, right eye.  2.  Retained silicone oil implant, right eye, causing secondary glaucoma,      right eye.   PROCEDURE:  1.  Posterior vitrectomy, right eye.  2.  Removal of nonmagnetic foreign body, right eye.  3.  Insertion of glaucoma shunt - Seton-Baerveldt via the pars plana, model      [id number], made by [employer].   SURGEON:  Deloria Lair, M.D.   ANESTHESIA:  General endotracheal anesthesia.   INDICATION FOR PROCEDURE:  The patient is a 70 year old man who has profound  vision loss on basis of neglected diabetic retinopathy which he has a  history of tractional detachment, complex repair using lensectomy, rupture  of the capsule, posterior capsule.  Patient maintained aphakic and the  patient had silicone oil placed to repair this complex retinal detachment.  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  The patient understands that this  is an attempt to remove the oil, place a glaucoma shunt so as to preserve  and protect the ultimate functioning of the globe.  The patient understands  the risks of anesthesia, including rare occurrence of  death, also to the eye  including, but not limited to, hemorrhage, infection, scarring, need for  further surgery, no change in vision, loss of vision, progressive disease  despite intervention.  After appropriate signed consent was obtained, the  patient was taken to the operating room.  In the operating room appropriate  monitoring was followed by general endotracheal anesthesia.  The right  ocular region sterilely prepped and draped in the usual ophthalmic fashion.  A lid speculum applied.  A conjunctival peritomy was then fashioned  temporally superiorly.  A Lewicky anterior chamber maintainer was placed  into the anterior chamber.  Silicone oil was evacuated using 18 gauge  attachment to the viscous fluid cutter and oil was removed in this fashion.  Fluid air exchange was completed on two occasions to help small migratory  bubbles of silicone to be removed.  Posterior pole remained attached  throughout.  Further vitreous was trimmed through temporally.  There was no  retinal detachment.  The optic nerve was stable from its preoperative  appearance, remained pale.  At this time, the superotemporal sclerotomy was  then  used as the site of placement of the pars plana glaucoma shunt.  Glaucoma shunt was placed, secured.  The foot plate and the seton tube were  secured with 8-0 nylon suture to the sclera.  Tutoplast was then used to  cover the seton insertion site.  The 7-0 Vicryl was then used to temporarily  close the tube so to prevent postoperative hypopnea.  It must be noted that  the tube had been irrigated with BSS prior to placement.  At this time, the  conjunctiva was then brought forward and closed with horizontal and vertical  mattress fashion with 7-0 Vicryl to the limbus.  A subconjunctival injection  of antibiotic and steroid applied inferiorly.  Intraocular pressure was  assessed, found to be adequate.  The conjunctiva and fillings such that  there was patency prior to  closing the tube with the 7-0 Vicryl.  At this  time, all sutures had been removed.  The sterile patch, Fox shield applied.  The patient tolerated the procedure well without complication.  Taken to the  recovery room in good stable condition.       GAR/MEDQ  D:  05/01/2004  T:  05/01/2004  Job:  [id number]

## 2010-06-09 NOTE — Op Note (Signed)
NAMEMYKELTI, MOMINEE NO.:  [id number]   MEDICAL RECORD NO.:  [id number]          PATIENT TYPE:  OIB   LOCATION:  2550                         FACILITY:  [facility]   PHYSICIAN:  Clent Demark. Rankin, M.D.   DATE OF BIRTH:  03-Sep-1940   DATE OF PROCEDURE:  01/03/2004  DATE OF DISCHARGE:                                 OPERATIVE REPORT   PREOPERATIVE DIAGNOSIS:  1.  Traction detachment, macular off OD.  2.  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  3.  Epiretinal membrane, severe, OD, with table top retinal detachment.  4.  Progressive proliferative diabetic retinopathy and neglected despite      previous panphotocoagulation and progression.   POSTOPERATIVE DIAGNOSIS:  1.  Traction detachment, macular off OD.  2.  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  3.  Epiretinal membrane, severe, OD, with table top retinal detachment.  4.  Progressive proliferative diabetic retinopathy and neglected despite      previous panphotocoagulation and progression.  5.  Nuclear sclerotic cataract which hampers delivery of the surgical      repair.   PROCEDURE:  1.  Posterior vitrectomy and membrane peel, OD.  2.  Endolaser panretinal photocoagulation OD for retinopexy and ablation      purposes.  3.  Pars plana lensectomy OD.  4.  Injection of vitreous substitute - permanent, silicone oil [id number]      [place] OD.   SURGEON:  Clent Demark. Rankin, M.D.   ANESTHESIA:  General endotracheal anesthesia.   INDICATIONS FOR PROCEDURE:  The patient is a 70 year old man who has  profound vision loss in his only sighted eye with table top retinal  detachment combined with rhegmatogenous detachment from severe, dense,  fibrous proliferative preretinal tissue from neglected diabetic retinopathy.  This is an attempt to reattach the retina.  He understands the difficult  nature of this process and the high risk of profound vision loss even with  surgical  intervention.  He understands the need for surgical intervention to  preserve his globe and potentially preserve at least ambulatory vision.  He  understands the need for possible cataract surgery and the use for silicone  oil.  He understands the risks of anesthesia including the rare occurrence  of death or loss to the eye.  The potential complications from the disease  and the surgical repair including but not limited to hemorrhage, infection,  scarring, need for further surgery, no change in vision, loss of vision,  progression of disease despite intervention.  After appropriate signed  consent was obtained, he was taken to the operating room.   DESCRIPTION OF PROCEDURE:  In the operating room, appropriate monitoring was  supplied by mild sedation.  0.75% Marcaine was delivered 5 mL retrobulbar  and an additional 5 mL laterally fashioned in modified [employer].  The right  periocular region was sterilely prepped and draped in the usual ophthalmic  fashion.  A lid speculum was applied.  A conjunctival peritomy was then  fashioned temporally and superonasally.  The 3.5 mm infusion cannula was  secured in the inferotemporal quadrant.  Placement  in the vitreous cavity  was verified visually.  A superior sclerotomy was then fashioned.  The  operating microscope was placed into position with the BIOM attached.  A  core vitrectomy was then begun.  Notable findings of that of an absence of a  previous posterior vitreous detachment and essentially a table top retinal  detachment with extension peripherally because of the basis of severe  vitreoretinal adhesion 360 degrees.  The incision was made in the posterior  hyaloid cone inferiorly and this allowed for dissection of the hyaloid cone,  partial circumcision of the peripheral vitreous cone, and then extension of  this dissection of the hyaloid off the macular region extending from an  anterior dissection to the posterior dissection.  Using  combined scissors  delamination and segmentation techniques, all massive fibrous tissues were  removed.  Extension of the fibrous tissues were flat, vascular plaque  essentially encompassing the entire macular region arcades, temporal  arcades, optic nerve, and even nasal to the optic nerve.  Extensive  dissection was carried out.  This was a lengthy and difficult surgical  procedure.  Retinal holes were noted nasal to optic nerve one and also  temporal to the macular region.  Fluid/fluid exchange was carried out and  thick slurry of subretinal fluid was removed.  These retinal holes were  marked and identified with endodiathermy for later purposes.  Prior to  completion of any of the dissection, the cataract hampered adequate  visualization.  The posterior capsule was opened, hydrodissection and  delineation carried out in the bag.  Phacofragmentation with fragmatome was  carried out in the bag and went from posterior approach.  Cortical clean up  was then carried out.  Notable findings was that of a central rent in the  anterior capsule in a vertical orientation.  Difficult decision was made to  go ahead and remove this material because of the likelihood of silicone oil  migration with any attempt to place a posterior chamber lens in the sulcus  with this large rent.  Based upon this, all capsular fragments are removed  with forceps extraction and a plan was made to leave the patient aphakic  with this poorly sighted eye, the best correction we can give him is some  potential magnification with the oil in place.  At this time, fluid air  exchange was completed and the retina flattened rather nicely and  surprisingly.  Reaspiration and recollection of the fluid was carried out in  multiple attempts.  Endolaser photocoagulation was placed 360 degrees.  The  retina flattened rather nicely.  The macula, optic nerve, and vascular structures were readily identifiable.  At this time, peripheral  iridectomy  had been fashioned prior to the fluid air exchange allowing, at this time,  the air-silicone oil exchange to occur passively.  The superior sclerotomy  was closed with 7-0 Vicryl suture.  The infusion was removed and sclerotomy  closed with 7-0 Vicryl suture.  The conjunctiva was closed with 7-0 Vicryl.  Subconjunctival injection of antibiotics and steroid were applied.  The  patient tolerated the procedure well without complications.       GAR/MEDQ  D:  01/03/2004  T:  01/03/2004  Job:  [id number]

## 2010-06-09 NOTE — Discharge Summary (Signed)
[place]. [facility]  Patient:    KSHAWN, GARRY                      MRN: [id number] Adm. Date:  [id number] Disc. Date: 05/05/00 Attending:  Valla Leaver Dictator:   Sherrilyn Rist, P.A.-C. CC:         Kerry Hough., M.D.   Discharge Summary  DISCHARGE DIAGNOSES:  1. Coronary artery disease status post coronary artery bypass graft.  2. Non-insulin-dependent diabetes mellitus.  3. History of hypertension.  4. History of glaucoma and peripheral neuropathy.  5. Volume overload, improved.  6. Postoperative ______, resolved.  7. Acute elevation of the creatinine level, resolved.  8. Postop anemia, resolved.  9. One episode of generalized weakness, and visual deficiency possibly     related to diabetes and glaucoma, resolved. Of note, the patient does     have chronic vision loss in the left eye.  SURGERIES:  1. Status post cardiac catheterization April 26, 2000, Dr. Wynonia Lawman.  2. Status post CABG x 5 on April 29, 2000 by Dr. Cyndia Bent with LIMA     to the LAD, SVG to the intermediate coronary artery, SVG to the     obtuse marginal branch of the left circumflex, and sequential     saphenous vein graft to the PDA and PLA branches of the right coronary     arteries.  CONDITION ON DISCHARGE:  Improved.  DISCHARGE MEDICATIONS:  1. Coated aspirin 81 p.o. q.d.  2. Darvocet-N 100 1 or 2 p.o. q. 4-6h p.r.n. for pain.  3. Atenolol 12.5 mg b.i.d.  4. Amaryl 4 mg p.o. q.d.  5. Zocor 20 mg p.o. q.d.  HOSPITAL COURSE:  Mr. Capan is a pleasant 70 year old black male with a past history of coronary artery disease status post MI in 1 and angioplasty at the time of the right coronary artery. He subsequently had restenosis requiring a second intervention. Approximately three weeks ago, he began having prolonged episodes of chest pain with exertion relieved with rest. He presented to [facility] with a prolonged episode of chest pain relieved  with nitroglycerin and heparin. Cardiac catheterization by Dr. Wynonia Lawman revealed severe three vessel coronary artery disease. After review of the angiogram and examination of the patient, it was felt that coronary artery bypass surgery was the best treatment. Dr. Cyndia Bent was contacted, and evaluated the patient. He explained the risks and benefits involved in the procedure, and the patient agreed to continue. On April 29, 2000, the patient underwent CABG x 5 with LIMA to the LAD, SVG to the intermediate, sequence SVG to the PD to the PL and SVG to the OM using saphenous vein harvest from the right leg. The patient tolerated the procedure well, and was transferred to the SICU in stable condition. Overall, the patient had a good recovery. His hospital stay was remarkable for an episode of transient pericarditis postoperatively, which resolved shortly after it began. As well, on May 01, 2000, he complained of some diplopia, and unsteadiness during ambulation. Upon evaluation, Dr. Cyndia Bent concluded that would continue to monitor the vision changes, as well as his generalized weakness, in the case of a neuro event. Later on, he improved his ambulation, and he denied any other vision changes, besides his chronic left eye vision loss, secondary to retinal neuropathy. During the rest of his hospitalization, his condition continued to improve. On May 04, 2000, he remained afebrile, his vital signs were stable, his  saturation of oxygen was over 95% on room air. He had no complaints, he was ambulating accordingly. His physical examination was unremarkable. It is anticipated that he will be discharged in stable condition on May 05, 2000. Pacing wires have been discontinued per protocol. The patient tolerated the procedure well, and no cardiac changes have been detected by the monitor. DD:  05/04/00 TD:  05/04/00 Job: [id number]

## 2010-06-09 NOTE — H&P (Signed)
[place]. [facility]  Patient:    Caleb Dunn, Caleb Dunn                      MRN: [id number] Adm. Date:  [id number] Attending:  Monico Blitz CC:         Walker Kehr, M.D. [facility]                         History and Physical  CHIEF COMPLAINT: Chest pain.  HISTORY OF PRESENT ILLNESS: The patient is a 70 year old black male with a known history of hypertension, diabetes, and prior coronary disease.  About ten years ago he had a myocardial infarction when in [place], [place] and had an angioplasty.  He was seen at our office briefly around that time but has not been seen in follow-up since then.  He has had diabetes for years and has not had much in the way of chest pain.  Around three months ago, however, he had a prolonged episode of severe chest pain with radiation to the back lasting around 20 minutes that resembled his previous heart attack.  He did not seek medical attention for this.  For the past three weeks he has had progressive increase in exertional chest pain which has effected him at work and has been relieved with rest.  He has not had any resting type chest pain. The pain would occur with most any level of activity and has become progressive.  He was working this evening and had onset of fairly severe chest pain and was transported to the emergency room.  The pain lasted around one hour and was described as pressure and heaviness and radiated to the back, and was associated with sweating.  It was relieved in the emergency room with heparin and IV nitroglycerin, and he is currently pain-free.  He is admitted at this time to rule out an MI.  PAST MEDICAL HISTORY:  1. Diabetes mellitus.  2. Prior history of retinopathy as well as some peripheral neuropathy.  3. Hypertension for several years.  He has run out of his medicine and his     blood pressure was elevated when he came in.  His cholesterol status is     unknown.  Known history of  diabetes.  PAST SURGICAL HISTORY: Removal of growth from neck.  ALLERGIES: No known drug allergies.  CURRENT MEDICATIONS:  1. Amaryl 4 mg q.d.  2. Glucophage 1000 mg b.i.d.  3. Lotensin and hydrochlorothiazide 20/12.5 mg q.d. ]  4. Aspirin q.d.  5. He also uses eyedrops for glaucoma.  FAMILY HISTORY: Father died of cirrhosis.  Mother died of complications with diabetes in her 72s.  Sister died of complications from diabetes.  Another sister has hypertension.  No family history of premature heart disease.  SOCIAL HISTORY: The patient works as a [employer] manager.  He has been married three times previously.  He does not use alcohol.  He quit smoking in 1991.  He has been married to his current wife for about eight years.  REVIEW OF SYSTEMS: The patient has glaucoma and also has some diabetic retinopathy.  He has no difficulty hearing and no difficulty swallowing. There is no history of ulcer.  He has had some lower abdominal pain for which he has sought treatment.  He has erectile dysfunction with impotence for many years now.  There are no kidney stones and some mild nocturia.  His cholesterol status is unknown.  He complains of numbness involving both feet and also has pain involving his calves when he walks and that hurt at other times.  There are no psychiatric problems.  The remainder of the Review Of Systems is unremarkable except as noted above.  PHYSICAL EXAMINATION:  GENERAL: He is a pleasant, obese black male in no acute distress.  VITAL SIGNS: Blood pressure 160/100, pulse 78.  SKIN: Warm and dry.  HEENT: EOMI.  PERRLA.  C&S clear.  Diabetic retinopathy appreciated.  Pharynx negative.  NECK: Supple without masses.  No thyromegaly, JVD, or carotid bruits.  Lymph nodes negative.  CARDIAC: Normal S1 and S2, no S3 or murmur.  LUNGS: Clear.  ABDOMEN: Obese, soft, nontender.  No aneurysm.  EXTREMITIES: Femoral and distal pulses 2+.  No bruits noted.  No edema  noted.  LABORATORY DATA: A 12 lead ECG shows previous inferior infarction.  Glucose 290, creatinine 1.4.  Hemoglobin 13.4, hematocrit 38.5.  PT and PTT normal.  CPK normal.  Troponin 0.05.  IMPRESSION:  1. Unstable angina pectoris.  2. 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  3. Type 2 diabetes mellitus.  4. Hypertension, well controlled.  5. 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  6. Obesity.  7. History of glaucoma.  8. Erectile dysfunction.  PLAN: The patient is admitted to a telemetry bed and will have series CPKs and EKGs.  Begin heparin and IV nitroglycerin, beta-blocker, aspirin, and increase ACE inhibitor to control blood pressure.  Plan cardiac catheterization to assess coronary anatomy.  Possibility of angioplasty and stenting discussed with the patient and he is agreeable. DD:  04/25/00 TD:  04/26/00 Job: [id number]

## 2010-06-28 ENCOUNTER — Encounter: Payer: Self-pay | Admitting: Gastroenterology

## 2010-09-13 ENCOUNTER — Other Ambulatory Visit (INDEPENDENT_AMBULATORY_CARE_PROVIDER_SITE_OTHER): Payer: Medicare HMO

## 2010-09-13 DIAGNOSIS — E119 Type 2 diabetes mellitus without complications: Secondary | ICD-10-CM

## 2010-09-13 LAB — COMPREHENSIVE METABOLIC PANEL
ALT: 20 U/L (ref 0–53)
AST: 16 U/L (ref 0–37)
Albumin: 4.1 g/dL (ref 3.5–5.2)
Alkaline Phosphatase: 66 U/L (ref 39–117)
Glucose, Bld: 72 mg/dL (ref 70–99)
Potassium: 4.3 mEq/L (ref 3.5–5.1)
Sodium: 139 mEq/L (ref 135–145)
Total Bilirubin: 0.3 mg/dL (ref 0.3–1.2)
Total Protein: 8.1 g/dL (ref 6.0–8.3)

## 2010-09-13 LAB — HEMOGLOBIN A1C: Hgb A1c MFr Bld: 7.2 % — ABNORMAL HIGH (ref 4.6–6.5)

## 2010-09-18 ENCOUNTER — Ambulatory Visit (INDEPENDENT_AMBULATORY_CARE_PROVIDER_SITE_OTHER): Payer: Medicare HMO | Admitting: Internal Medicine

## 2010-09-18 ENCOUNTER — Encounter: Payer: Self-pay | Admitting: Internal Medicine

## 2010-09-18 DIAGNOSIS — I10 Essential (primary) hypertension: Secondary | ICD-10-CM

## 2010-09-18 DIAGNOSIS — I2510 Atherosclerotic heart disease of native coronary artery without angina pectoris: Secondary | ICD-10-CM

## 2010-09-18 DIAGNOSIS — E119 Type 2 diabetes mellitus without complications: Secondary | ICD-10-CM

## 2010-09-18 DIAGNOSIS — E7800 Pure hypercholesterolemia, unspecified: Secondary | ICD-10-CM

## 2010-09-18 MED ORDER — METOPROLOL SUCCINATE ER 50 MG PO TB24: 50.0000 mg | ORAL_TABLET | Freq: Every day | ORAL | Status: DC

## 2010-09-18 MED ORDER — FINASTERIDE 5 MG PO TABS: 5.0000 mg | ORAL_TABLET | Freq: Every day | ORAL | Status: DC

## 2010-09-18 MED ORDER — METFORMIN HCL 1000 MG PO TABS: 1000.0000 mg | ORAL_TABLET | Freq: Two times a day (BID) | ORAL | Status: DC

## 2010-09-18 MED ORDER — GLIMEPIRIDE 2 MG PO TABS: 2.0000 mg | ORAL_TABLET | Freq: Two times a day (BID) | ORAL | Status: DC

## 2010-09-18 MED ORDER — VERAPAMIL HCL 180 MG PO TBCR: 180.0000 mg | EXTENDED_RELEASE_TABLET | Freq: Every day | ORAL | Status: DC

## 2010-09-18 MED ORDER — LOSARTAN POTASSIUM-HCTZ 100-25 MG PO TABS: 1.0000 | ORAL_TABLET | Freq: Every day | ORAL | Status: DC

## 2010-09-18 MED ORDER — OMEPRAZOLE 20 MG PO CPDR: 40.0000 mg | DELAYED_RELEASE_CAPSULE | Freq: Every day | ORAL | Status: DC

## 2010-09-18 MED ORDER — LOVASTATIN 20 MG PO TABS: 20.0000 mg | ORAL_TABLET | Freq: Every day | ORAL | Status: DC

## 2010-09-18 NOTE — Progress Notes (Signed)
  Subjective:    Patient ID: Caleb Dunn, male    DOB: 01-21-1941, 70 y.o.   MRN: [id number]  HPI  The patient presents for a follow-up of  CAD, CRF, chronic hypertension, chronic dyslipidemia, type 2 diabetes controlled with medicines    Review of Systems  Constitutional: Negative for appetite change, fatigue and unexpected weight change.  HENT: Negative for nosebleeds, congestion, sore throat, sneezing, trouble swallowing and neck pain.   Eyes: Negative for itching and visual disturbance.       Blind   Respiratory: Negative for cough.   Cardiovascular: Negative for chest pain, palpitations and leg swelling.  Gastrointestinal: Negative for nausea, diarrhea, blood in stool and abdominal distention.  Genitourinary: Negative for frequency and hematuria.  Musculoskeletal: Negative for back pain, joint swelling and gait problem.  Skin: Negative for rash.  Neurological: Negative for dizziness, tremors, speech difficulty and weakness.  Psychiatric/Behavioral: Negative for sleep disturbance, dysphoric mood and agitation. The patient is not nervous/anxious.        Objective:   Physical Exam  Constitutional: He is oriented to person, place, and time. He appears well-developed.       Obese, blind  HENT:  Mouth/Throat: Oropharynx is clear and moist.  Eyes: Conjunctivae are normal. Pupils are equal, round, and reactive to light.  Neck: Normal range of motion. No JVD present. No thyromegaly present.  Cardiovascular: Normal rate, regular rhythm, normal heart sounds and intact distal pulses.  Exam reveals no gallop and no friction rub.   No murmur heard. Pulmonary/Chest: Effort normal and breath sounds normal. No respiratory distress. He has no wheezes. He has no rales. He exhibits no tenderness.  Abdominal: Soft. Bowel sounds are normal. He exhibits no distension and no mass. There is no tenderness. There is no rebound and no guarding.  Musculoskeletal: Normal range of motion. He exhibits  no edema and no tenderness.  Lymphadenopathy:    He has no cervical adenopathy.  Neurological: He is alert and oriented to person, place, and time. He has normal reflexes. No cranial nerve deficit. He exhibits normal muscle tone. Coordination normal.  Skin: Skin is warm and dry. No rash noted.  Psychiatric: He has a normal mood and affect. His behavior is normal. Judgment and thought content normal.   Lab Results  Component Value Date   WBC 7.2 11/15/2009   HGB 14.0 11/15/2009   HCT 42.2 11/15/2009   PLT 225.0 11/15/2009   CHOL 126 11/15/2009   TRIG 84.0 11/15/2009   HDL 34.20* 11/15/2009   ALT 20 09/13/2010   AST 16 09/13/2010   NA 139 09/13/2010   K 4.3 09/13/2010   CL 107 09/13/2010   CREATININE 2.1* 09/13/2010   BUN 28* 09/13/2010   CO2 26 09/13/2010   TSH 1.41 11/15/2009   PSA 4.04* 05/15/2010   HGBA1C 7.2* 09/13/2010          Assessment & Plan:

## 2010-09-18 NOTE — Assessment & Plan Note (Signed)
On Rx 

## 2010-09-18 NOTE — Assessment & Plan Note (Signed)
On RX 

## 2010-09-18 NOTE — Assessment & Plan Note (Signed)
Controlled BP Readings from Last 3 Encounters:  09/18/10 142/68  05/19/10 132/88  02/17/10 100/58

## 2010-09-26 ENCOUNTER — Other Ambulatory Visit: Payer: Self-pay | Admitting: *Deleted

## 2010-09-26 MED ORDER — VERAPAMIL HCL 180 MG PO TBCR: 180.0000 mg | EXTENDED_RELEASE_TABLET | Freq: Every day | ORAL | Status: DC

## 2010-09-26 MED ORDER — METOPROLOL SUCCINATE ER 50 MG PO TB24: 50.0000 mg | ORAL_TABLET | Freq: Every day | ORAL | Status: DC

## 2010-09-26 MED ORDER — LOVASTATIN 20 MG PO TABS: 20.0000 mg | ORAL_TABLET | Freq: Every day | ORAL | Status: DC

## 2010-09-26 MED ORDER — LOSARTAN POTASSIUM-HCTZ 100-25 MG PO TABS: 1.0000 | ORAL_TABLET | Freq: Every day | ORAL | Status: DC

## 2010-09-26 MED ORDER — FINASTERIDE 5 MG PO TABS: 5.0000 mg | ORAL_TABLET | Freq: Every day | ORAL | Status: DC

## 2010-09-26 MED ORDER — GLIMEPIRIDE 2 MG PO TABS: 2.0000 mg | ORAL_TABLET | Freq: Two times a day (BID) | ORAL | Status: DC

## 2010-09-26 MED ORDER — OMEPRAZOLE 20 MG PO CPDR: 40.0000 mg | DELAYED_RELEASE_CAPSULE | Freq: Every day | ORAL | Status: DC

## 2010-09-26 MED ORDER — METFORMIN HCL 1000 MG PO TABS: 1000.0000 mg | ORAL_TABLET | Freq: Two times a day (BID) | ORAL | Status: DC

## 2010-09-26 NOTE — Progress Notes (Signed)
Pt's daughter Edwena Felty called stating [facility] never received rxs from last OV. Will resend them now.

## 2010-10-27 ENCOUNTER — Telehealth: Payer: Self-pay | Admitting: *Deleted

## 2010-10-27 NOTE — Telephone Encounter (Signed)
Left detailed vm for patient.

## 2010-10-27 NOTE — Telephone Encounter (Signed)
I agree. Check CBG  2-3 times to make sure that glu is not too high Thx

## 2010-10-27 NOTE — Telephone Encounter (Signed)
Pt c/o dry throat x > 1 mth. He has sore throat in the am, dryness is worse when lying on one side. No fever, chills, body aches, sinus congestion or drainage. Pt says he was told to drink more water after discussing symptoms at last OV. He has been drinking more water w/no improvement. I advised trying throat lozenges and come in for OV to discuss further if symptoms did not improve or became worse - he agreed.

## 2010-11-27 ENCOUNTER — Telehealth: Payer: Self-pay | Admitting: *Deleted

## 2010-11-27 MED ORDER — AMOXICILLIN 500 MG PO CAPS: 1000.0000 mg | ORAL_CAPSULE | Freq: Two times a day (BID) | ORAL | Status: AC

## 2010-11-27 NOTE — Telephone Encounter (Signed)
Pt states he has had a scratchy throat x 2 weeks, also coughing up phlegm, but unsure if it has any color.  Pt has been using Vicks cough drops, but nothing else.  Pt would like RX for ABX called to pharmacy.  Pt has difficulty coming to appts due to blindness.  Please advise.

## 2010-11-27 NOTE — Telephone Encounter (Signed)
Amoxicillin emailed Thx

## 2010-11-28 NOTE — Telephone Encounter (Signed)
Patient informed. 

## 2010-12-27 ENCOUNTER — Encounter: Payer: Self-pay | Admitting: Internal Medicine

## 2010-12-27 ENCOUNTER — Ambulatory Visit (INDEPENDENT_AMBULATORY_CARE_PROVIDER_SITE_OTHER): Payer: Medicare HMO | Admitting: Internal Medicine

## 2010-12-27 ENCOUNTER — Other Ambulatory Visit (INDEPENDENT_AMBULATORY_CARE_PROVIDER_SITE_OTHER): Payer: Medicare HMO

## 2010-12-27 ENCOUNTER — Telehealth: Payer: Self-pay | Admitting: Internal Medicine

## 2010-12-27 VITALS — BP 110/68 | HR 88 | Temp 98.0°F | Resp 16 | Wt 207.0 lb

## 2010-12-27 DIAGNOSIS — I2510 Atherosclerotic heart disease of native coronary artery without angina pectoris: Secondary | ICD-10-CM

## 2010-12-27 DIAGNOSIS — E119 Type 2 diabetes mellitus without complications: Secondary | ICD-10-CM

## 2010-12-27 DIAGNOSIS — E7800 Pure hypercholesterolemia, unspecified: Secondary | ICD-10-CM

## 2010-12-27 DIAGNOSIS — Z23 Encounter for immunization: Secondary | ICD-10-CM

## 2010-12-27 LAB — LIPID PANEL
LDL Cholesterol: 54 mg/dL (ref 0–99)
Total CHOL/HDL Ratio: 3
Triglycerides: 96 mg/dL (ref 0.0–149.0)

## 2010-12-27 LAB — BASIC METABOLIC PANEL
BUN: 23 mg/dL (ref 6–23)
Creatinine, Ser: 1.9 mg/dL — ABNORMAL HIGH (ref 0.4–1.5)
GFR: 44.59 mL/min — ABNORMAL LOW (ref >60.00)
Potassium: 4.1 mEq/L (ref 3.5–5.1)

## 2010-12-27 LAB — HEMOGLOBIN A1C: Hgb A1c MFr Bld: 7.4 % — ABNORMAL HIGH (ref 4.6–6.5)

## 2010-12-27 NOTE — Telephone Encounter (Signed)
Stacey, please, inform patient that all labs are ok Thx 

## 2010-12-27 NOTE — Progress Notes (Signed)
  Subjective:    Patient ID: Caleb Dunn, male    DOB: [birth year], 70 y.o.   MRN: [id number]  HPI  The patient presents for a follow-up of  chronic hypertension, chronic dyslipidemia, type 2 diabetes, CAD controlled with medicines. He is sad Pamala Hurry died...    Review of Systems  Constitutional: Negative for appetite change, fatigue and unexpected weight change.  HENT: Negative for nosebleeds, congestion, sore throat, sneezing, trouble swallowing and neck pain.   Eyes: Negative for itching and visual disturbance.  Respiratory: Negative for cough.   Cardiovascular: Negative for chest pain, palpitations and leg swelling.  Gastrointestinal: Negative for nausea, diarrhea, blood in stool and abdominal distention.  Genitourinary: Negative for frequency and hematuria.  Musculoskeletal: Negative for back pain, joint swelling and gait problem.  Skin: Negative for rash.  Neurological: Negative for dizziness, tremors, speech difficulty and weakness.  Psychiatric/Behavioral: Negative for suicidal ideas, sleep disturbance, dysphoric mood and agitation. The patient is not nervous/anxious.        Objective:   Physical Exam  Constitutional: He is oriented to person, place, and time. He appears well-developed.  HENT:  Mouth/Throat: Oropharynx is clear and moist.  Eyes: Conjunctivae are normal. Pupils are equal, round, and reactive to light.  Neck: Normal range of motion. No JVD present. No thyromegaly present.  Cardiovascular: Normal rate, regular rhythm, normal heart sounds and intact distal pulses.  Exam reveals no gallop and no friction rub.   No murmur heard. Pulmonary/Chest: Effort normal and breath sounds normal. No respiratory distress. He has no wheezes. He has no rales. He exhibits no tenderness.  Abdominal: Soft. Bowel sounds are normal. He exhibits no distension and no mass. There is no tenderness. There is no rebound and no guarding.  Musculoskeletal: Normal range of motion. He  exhibits no edema and no tenderness.  Lymphadenopathy:    He has no cervical adenopathy.  Neurological: He is alert and oriented to person, place, and time. He has normal reflexes. No cranial nerve deficit. He exhibits normal muscle tone. Coordination normal.  Skin: Skin is warm and dry. No rash noted.  Psychiatric: He has a normal mood and affect. His behavior is normal. Judgment and thought content normal.   Wt Readings from Last 3 Encounters:  12/27/10 207 lb (93.895 kg)  09/18/10 207 lb (93.895 kg)  05/19/10 203 lb (92.08 kg)          Assessment & Plan:

## 2010-12-27 NOTE — Assessment & Plan Note (Signed)
Continue with current prescription therapy as reflected on the Med list.  

## 2010-12-28 NOTE — Telephone Encounter (Signed)
Pt informed

## 2011-01-01 ENCOUNTER — Encounter: Payer: Self-pay | Admitting: Internal Medicine

## 2011-03-29 ENCOUNTER — Ambulatory Visit: Payer: Medicare HMO | Admitting: Internal Medicine

## 2011-04-11 ENCOUNTER — Other Ambulatory Visit (INDEPENDENT_AMBULATORY_CARE_PROVIDER_SITE_OTHER): Payer: Medicare HMO

## 2011-04-11 DIAGNOSIS — I10 Essential (primary) hypertension: Secondary | ICD-10-CM

## 2011-04-11 DIAGNOSIS — E119 Type 2 diabetes mellitus without complications: Secondary | ICD-10-CM

## 2011-04-11 LAB — BASIC METABOLIC PANEL
CO2: 25 mEq/L (ref 19–32)
Calcium: 9.6 mg/dL (ref 8.4–10.5)
GFR: 45.09 mL/min — ABNORMAL LOW (ref >60.00)
Sodium: 135 mEq/L (ref 135–145)

## 2011-04-13 ENCOUNTER — Encounter: Payer: Self-pay | Admitting: Internal Medicine

## 2011-04-13 ENCOUNTER — Ambulatory Visit (INDEPENDENT_AMBULATORY_CARE_PROVIDER_SITE_OTHER): Payer: Medicare HMO | Admitting: Internal Medicine

## 2011-04-13 VITALS — BP 142/80 | HR 80 | Temp 96.6°F | Resp 16 | Wt 214.0 lb

## 2011-04-13 DIAGNOSIS — N320 Bladder-neck obstruction: Secondary | ICD-10-CM

## 2011-04-13 DIAGNOSIS — E1139 Type 2 diabetes mellitus with other diabetic ophthalmic complication: Secondary | ICD-10-CM

## 2011-04-13 DIAGNOSIS — I10 Essential (primary) hypertension: Secondary | ICD-10-CM

## 2011-04-13 DIAGNOSIS — I2510 Atherosclerotic heart disease of native coronary artery without angina pectoris: Secondary | ICD-10-CM

## 2011-04-13 DIAGNOSIS — K219 Gastro-esophageal reflux disease without esophagitis: Secondary | ICD-10-CM

## 2011-04-13 DIAGNOSIS — Z0000 Encounter for general adult medical examination without abnormal findings: Secondary | ICD-10-CM

## 2011-04-13 DIAGNOSIS — H579 Unspecified disorder of eye and adnexa: Secondary | ICD-10-CM

## 2011-04-13 MED ORDER — TAMSULOSIN HCL 0.4 MG PO CAPS: 0.4000 mg | ORAL_CAPSULE | Freq: Every day | ORAL | Status: DC

## 2011-04-13 NOTE — Progress Notes (Signed)
Patient ID: Caleb Dunn, male   DOB: September 27, 1940, 71 y.o.   MRN: [id number]  Subjective:    Patient ID: Caleb Dunn, male    DOB: [birth year], 72 y.o.   MRN: [id number]  HPI  The patient presents for a follow-up of  chronic hypertension, chronic dyslipidemia, type 2 diabetes, CAD controlled with medicines. C/o dry nose. C/o urinary urgency incontinence, BPH.    Review of Systems  Constitutional: Negative for appetite change, fatigue and unexpected weight change.  HENT: Negative for nosebleeds, congestion, sore throat, sneezing, trouble swallowing and neck pain.   Eyes: Negative for itching and visual disturbance.  Respiratory: Negative for cough.   Cardiovascular: Negative for chest pain, palpitations and leg swelling.  Gastrointestinal: Negative for nausea, diarrhea, blood in stool and abdominal distention.  Genitourinary: Positive for urgency, frequency and decreased urine volume. Negative for hematuria.  Musculoskeletal: Negative for back pain, joint swelling and gait problem.  Skin: Negative for rash.  Neurological: Negative for dizziness, tremors, speech difficulty and weakness.  Psychiatric/Behavioral: Negative for suicidal ideas, sleep disturbance, dysphoric mood and agitation. The patient is not nervous/anxious.    BP Readings from Last 3 Encounters:  04/13/11 142/80  12/27/10 110/68  09/18/10 142/68        Objective:   Physical Exam  Constitutional: He is oriented to person, place, and time. He appears well-developed.  HENT:  Mouth/Throat: Oropharynx is clear and moist.  Eyes: Conjunctivae are normal. Pupils are equal, round, and reactive to light.  Neck: Normal range of motion. No JVD present. No thyromegaly present.  Cardiovascular: Normal rate, regular rhythm, normal heart sounds and intact distal pulses.  Exam reveals no gallop and no friction rub.   No murmur heard. Pulmonary/Chest: Effort normal and breath sounds normal. No respiratory distress. He has no  wheezes. He has no rales. He exhibits no tenderness.  Abdominal: Soft. Bowel sounds are normal. He exhibits no distension and no mass. There is no tenderness. There is no rebound and no guarding.  Musculoskeletal: Normal range of motion. He exhibits no edema and no tenderness.  Lymphadenopathy:    He has no cervical adenopathy.  Neurological: He is alert and oriented to person, place, and time. He has normal reflexes. No cranial nerve deficit. He exhibits normal muscle tone. Coordination normal.  Skin: Skin is warm and dry. No rash noted.  Psychiatric: He has a normal mood and affect. His behavior is normal. Judgment and thought content normal.   Wt Readings from Last 3 Encounters:  04/13/11 214 lb (97.07 kg)  12/27/10 207 lb (93.895 kg)  09/18/10 207 lb (93.895 kg)          Assessment & Plan:

## 2011-04-13 NOTE — Assessment & Plan Note (Signed)
Start Flomax

## 2011-04-13 NOTE — Assessment & Plan Note (Signed)
Continue with current prescription therapy as reflected on the Med list.  

## 2011-04-13 NOTE — Assessment & Plan Note (Signed)
Chronic, blind

## 2011-05-03 ENCOUNTER — Telehealth: Payer: Self-pay | Admitting: *Deleted

## 2011-05-03 DIAGNOSIS — Z0389 Encounter for observation for other suspected diseases and conditions ruled out: Secondary | ICD-10-CM

## 2011-05-03 DIAGNOSIS — Z0000 Encounter for general adult medical examination without abnormal findings: Secondary | ICD-10-CM

## 2011-05-03 NOTE — Telephone Encounter (Signed)
July CPE labs entered previously by Dr. Alain Marion.

## 2011-08-13 ENCOUNTER — Other Ambulatory Visit (INDEPENDENT_AMBULATORY_CARE_PROVIDER_SITE_OTHER): Payer: Medicare HMO

## 2011-08-13 DIAGNOSIS — Z125 Encounter for screening for malignant neoplasm of prostate: Secondary | ICD-10-CM

## 2011-08-13 DIAGNOSIS — H579 Unspecified disorder of eye and adnexa: Secondary | ICD-10-CM

## 2011-08-13 DIAGNOSIS — Z0000 Encounter for general adult medical examination without abnormal findings: Secondary | ICD-10-CM

## 2011-08-13 DIAGNOSIS — Z79899 Other long term (current) drug therapy: Secondary | ICD-10-CM

## 2011-08-13 DIAGNOSIS — I2510 Atherosclerotic heart disease of native coronary artery without angina pectoris: Secondary | ICD-10-CM

## 2011-08-13 DIAGNOSIS — N320 Bladder-neck obstruction: Secondary | ICD-10-CM

## 2011-08-13 DIAGNOSIS — I10 Essential (primary) hypertension: Secondary | ICD-10-CM

## 2011-08-13 DIAGNOSIS — K219 Gastro-esophageal reflux disease without esophagitis: Secondary | ICD-10-CM

## 2011-08-13 DIAGNOSIS — E1139 Type 2 diabetes mellitus with other diabetic ophthalmic complication: Secondary | ICD-10-CM

## 2011-08-13 LAB — URINALYSIS
Hgb urine dipstick: NEGATIVE
Ketones, ur: NEGATIVE
Total Protein, Urine: NEGATIVE
Urine Glucose: NEGATIVE
Urobilinogen, UA: 0.2 (ref 0.0–1.0)

## 2011-08-13 LAB — CBC WITH DIFFERENTIAL/PLATELET
Basophils Absolute: 0 10*3/uL (ref 0.0–0.1)
Eosinophils Absolute: 0.2 10*3/uL (ref 0.0–0.7)
Hemoglobin: 13.4 g/dL (ref 13.0–17.0)
Lymphocytes Relative: 40.4 % (ref 12.0–46.0)
Monocytes Relative: 8.9 % (ref 3.0–12.0)
Neutro Abs: 3.8 10*3/uL (ref 1.4–7.7)
Neutrophils Relative %: 47.6 % (ref 43.0–77.0)
RBC: 4.76 Mil/uL (ref 4.22–5.81)
RDW: 13.9 % (ref 11.5–14.6)

## 2011-08-13 LAB — HEPATIC FUNCTION PANEL
AST: 16 U/L (ref 0–37)
Albumin: 4 g/dL (ref 3.5–5.2)
Total Protein: 7.8 g/dL (ref 6.0–8.3)

## 2011-08-13 LAB — BASIC METABOLIC PANEL
CO2: 25 mEq/L (ref 19–32)
Chloride: 101 mEq/L (ref 96–112)
Creatinine, Ser: 2.4 mg/dL — ABNORMAL HIGH (ref 0.4–1.5)
Potassium: 5.2 mEq/L — ABNORMAL HIGH (ref 3.5–5.1)

## 2011-08-13 LAB — LIPID PANEL
HDL: 35.7 mg/dL — ABNORMAL LOW (ref >39.00)
Total CHOL/HDL Ratio: 3
Triglycerides: 118 mg/dL (ref 0.0–149.0)
VLDL: 23.6 mg/dL (ref 0.0–40.0)

## 2011-08-13 LAB — PSA: PSA: 5.71 ng/mL — ABNORMAL HIGH (ref 0.10–4.00)

## 2011-08-13 LAB — HEMOGLOBIN A1C: Hgb A1c MFr Bld: 8.7 % — ABNORMAL HIGH (ref 4.6–6.5)

## 2011-08-17 ENCOUNTER — Ambulatory Visit (INDEPENDENT_AMBULATORY_CARE_PROVIDER_SITE_OTHER): Payer: Medicare HMO | Admitting: Internal Medicine

## 2011-08-17 ENCOUNTER — Encounter: Payer: Self-pay | Admitting: Internal Medicine

## 2011-08-17 VITALS — BP 140/76 | HR 76 | Temp 97.8°F | Resp 16 | Wt 212.0 lb

## 2011-08-17 DIAGNOSIS — I2510 Atherosclerotic heart disease of native coronary artery without angina pectoris: Secondary | ICD-10-CM

## 2011-08-17 DIAGNOSIS — Z136 Encounter for screening for cardiovascular disorders: Secondary | ICD-10-CM

## 2011-08-17 DIAGNOSIS — K117 Disturbances of salivary secretion: Secondary | ICD-10-CM

## 2011-08-17 DIAGNOSIS — N320 Bladder-neck obstruction: Secondary | ICD-10-CM

## 2011-08-17 DIAGNOSIS — H579 Unspecified disorder of eye and adnexa: Secondary | ICD-10-CM

## 2011-08-17 DIAGNOSIS — Z0000 Encounter for general adult medical examination without abnormal findings: Secondary | ICD-10-CM | POA: Insufficient documentation

## 2011-08-17 DIAGNOSIS — N529 Male erectile dysfunction, unspecified: Secondary | ICD-10-CM

## 2011-08-17 DIAGNOSIS — I10 Essential (primary) hypertension: Secondary | ICD-10-CM

## 2011-08-17 DIAGNOSIS — E1139 Type 2 diabetes mellitus with other diabetic ophthalmic complication: Secondary | ICD-10-CM

## 2011-08-17 DIAGNOSIS — R682 Dry mouth, unspecified: Secondary | ICD-10-CM | POA: Insufficient documentation

## 2011-08-17 DIAGNOSIS — R972 Elevated prostate specific antigen [PSA]: Secondary | ICD-10-CM

## 2011-08-17 MED ORDER — SILDENAFIL CITRATE 100 MG PO TABS: 100.0000 mg | ORAL_TABLET | ORAL | Status: DC | PRN

## 2011-08-17 NOTE — Assessment & Plan Note (Signed)
Continue with current prescription therapy as reflected on the Med list.  

## 2011-08-17 NOTE — Assessment & Plan Note (Signed)
Lab Results  Component Value Date   PSA 5.71* 08/13/2011   PSA 4.04* 05/15/2010   PSA 4.45* 05/15/2010   Will recheck

## 2011-08-17 NOTE — Assessment & Plan Note (Signed)
7/13 The patient is here for annual Medicare wellness examination and management of other chronic and acute problems.   The risk factors are reflected in the social history.  The roster of all physicians providing medical care to patient - is listed in the Snapshot section of the chart.  Activities of daily living:  The patient is 100% inedpendent in all ADLs: dressing, toileting, feeding as well as independent mobility  Home safety : The patient has smoke detectors in the home. They wear seatbelts. There is no violence in the home.   There is no risks for hepatitis, STDs or HIV. There is no   history of blood transfusion. They have no travel history to infectious disease endemic areas of the world.  The patient has  seen their dentist in the last 12 month. They have  seen their eye doctor in the last year - he is blind. They deny  Any major hearing difficulty and have not had audiologic testing in the last year.  They do not  have excessive sun exposure. Discussed the need for sun protection: hats, long sleeves and use of sunscreen if there is significant sun exposure.   Diet: the importance of a healthy diet is discussed. They do have a reasonably healthy  diet.  The patient has a fairly regular exercise program of a mixed nature: exercise bike. The benefits of regular aerobic exercise were discussed.  Depression screen: there are no signs or vegative symptoms of depression- irritability, change in appetite, anhedonia, sadness/tearfullness.  Cognitive assessment: the patient manages all their financial and personal affairs and is actively engaged w/help. They could relate day,date,year and events; recalled 3/3 objects at 3 minutes  The following portions of the patient's history were reviewed and updated as appropriate: allergies, current medications, past family history, past medical history,  past surgical history, past social history  and problem list.  Vision (blind), hearing, body  mass index were assessed and reviewed.   During the course of the visit the patient was educated and counseled about appropriate screening and preventive services including : fall prevention , diabetes screening, nutrition counseling, colorectal cancer screening, and recommended immunizations.

## 2011-08-17 NOTE — Assessment & Plan Note (Signed)
Due to meds 7/13

## 2011-08-17 NOTE — Progress Notes (Signed)
Subjective:    Patient ID: Caleb Dunn, male    DOB: 04-Feb-1940, 71 y.o.   MRN: [id number]  HPI  The patient is here for a wellness exam. The patient has been doing well overall without major physical or psychological issues going on lately.  The patient presents for a follow-up of  chronic hypertension, chronic dyslipidemia, type 2 diabetes, CAD controlled with medicines. C/o dry mouth,nose. C/o urinary urgency incontinence, BPH. C/o ED    Review of Systems  Constitutional: Negative for appetite change, fatigue and unexpected weight change.  HENT: Negative for nosebleeds, congestion, sore throat, sneezing, trouble swallowing and neck pain.   Eyes: Positive for visual disturbance (blind). Negative for itching.  Respiratory: Negative for cough.   Cardiovascular: Negative for chest pain, palpitations and leg swelling.  Gastrointestinal: Negative for nausea, diarrhea, blood in stool and abdominal distention.  Genitourinary: Positive for urgency, frequency and decreased urine volume. Negative for hematuria.  Musculoskeletal: Negative for back pain, joint swelling and gait problem.  Skin: Negative for rash.  Neurological: Negative for dizziness, tremors, speech difficulty and weakness.  Psychiatric/Behavioral: Negative for suicidal ideas, disturbed wake/sleep cycle, dysphoric mood and agitation. The patient is not nervous/anxious.    BP Readings from Last 3 Encounters:  08/17/11 140/76  04/13/11 142/80  12/27/10 110/68   Wt Readings from Last 3 Encounters:  08/17/11 212 lb (96.163 kg)  04/13/11 214 lb (97.07 kg)  12/27/10 207 lb (93.895 kg)        Objective:   Physical Exam  Constitutional: He is oriented to person, place, and time. He appears well-developed.  HENT:  Mouth/Throat: Oropharynx is clear and moist.  Eyes: Conjunctivae are normal. Right eye exhibits no discharge. Left eye exhibits no discharge.       Blind   Neck: Normal range of motion. No JVD present. No  thyromegaly present.  Cardiovascular: Normal rate, regular rhythm, normal heart sounds and intact distal pulses.  Exam reveals no gallop and no friction rub.   No murmur heard. Pulmonary/Chest: Effort normal and breath sounds normal. No respiratory distress. He has no wheezes. He has no rales. He exhibits no tenderness.  Abdominal: Soft. Bowel sounds are normal. He exhibits no distension and no mass. There is no tenderness. There is no rebound and no guarding.  Genitourinary: Rectum normal. Guaiac negative stool.       Prostate is 1+  Musculoskeletal: Normal range of motion. He exhibits no edema and no tenderness.  Lymphadenopathy:    He has no cervical adenopathy.  Neurological: He is alert and oriented to person, place, and time. He has normal reflexes. No cranial nerve deficit. He exhibits normal muscle tone. Coordination normal.  Skin: Skin is warm and dry. No rash noted.  Psychiatric: He has a normal mood and affect. His behavior is normal. Judgment and thought content normal.   Lab Results  Component Value Date   WBC 8.0 08/13/2011   HGB 13.4 08/13/2011   HCT 40.5 08/13/2011   PLT 246.0 08/13/2011   GLUCOSE 76 08/13/2011   CHOL 118 08/13/2011   TRIG 118.0 08/13/2011   HDL 35.70* 08/13/2011   LDLCALC 59 08/13/2011   ALT 20 08/13/2011   AST 16 08/13/2011   NA 137 08/13/2011   K 5.2* 08/13/2011   CL 101 08/13/2011   CREATININE 2.4* 08/13/2011   BUN 30* 08/13/2011   CO2 25 08/13/2011   TSH 0.80 08/13/2011   PSA 5.71* 08/13/2011   HGBA1C 8.7* 08/13/2011  Assessment & Plan:

## 2011-08-17 NOTE — Assessment & Plan Note (Signed)
Given viagra

## 2011-08-17 NOTE — Assessment & Plan Note (Addendum)
Worse Continue with current prescription therapy as reflected on the Med list. Improve compliance

## 2011-10-11 ENCOUNTER — Encounter: Payer: Self-pay | Admitting: Gastroenterology

## 2011-10-19 ENCOUNTER — Other Ambulatory Visit: Payer: Self-pay | Admitting: Cardiology

## 2011-10-19 DIAGNOSIS — I2510 Atherosclerotic heart disease of native coronary artery without angina pectoris: Secondary | ICD-10-CM

## 2011-10-19 DIAGNOSIS — K219 Gastro-esophageal reflux disease without esophagitis: Secondary | ICD-10-CM

## 2011-10-19 DIAGNOSIS — E669 Obesity, unspecified: Secondary | ICD-10-CM

## 2011-10-19 DIAGNOSIS — E7800 Pure hypercholesterolemia, unspecified: Secondary | ICD-10-CM

## 2011-10-19 DIAGNOSIS — G5600 Carpal tunnel syndrome, unspecified upper limb: Secondary | ICD-10-CM

## 2011-10-19 DIAGNOSIS — N529 Male erectile dysfunction, unspecified: Secondary | ICD-10-CM

## 2011-10-19 DIAGNOSIS — I10 Essential (primary) hypertension: Secondary | ICD-10-CM

## 2011-10-19 DIAGNOSIS — N184 Chronic kidney disease, stage 4 (severe): Secondary | ICD-10-CM

## 2011-10-19 DIAGNOSIS — R972 Elevated prostate specific antigen [PSA]: Secondary | ICD-10-CM

## 2011-10-19 DIAGNOSIS — K8020 Calculus of gallbladder without cholecystitis without obstruction: Secondary | ICD-10-CM

## 2011-10-19 DIAGNOSIS — M5412 Radiculopathy, cervical region: Secondary | ICD-10-CM

## 2011-10-19 NOTE — Progress Notes (Signed)
Caleb Dunn    Date of visit:  10/19/2011 DOB:  05/30/1940    Age:  71 yrs. Medical record number:  [id number]     Account number:  [id number] Primary Care Provider: Walker Kehr V ____________________________ CURRENT DIAGNOSES  1. CAD,Native  2. 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 Hyperlipidemia  4. Chronic Kidney Disease (Stage 3)  5. Surgery-Aortocoronary Bypass Grafting  6. Obesity(BMI30-40)  7. Diabetes Mellitus-with Retinopathy NIDD ____________________________ ALLERGIES  NKDA ____________________________ MEDICATIONS  1. Vitamin D-3 400 unit Tablet, 1 p.o. daily  2. metoprolol succinate 50 mg Tablet Extended Release 24 hr, 1 p.o. daily  3. aspirin 81 mg Tablet, Chewable, 1 p.o. q.d.  4. verapamil 180 mg Cap,Ext Release Pellets 24 hr, 1 p.o. daily  5. omeprazole 20 mg Tablet, Delayed Release (E.C.), 2 p.o. daily  6. metformin 1,000 mg Tablet, BID  7. finasteride 5 mg Tablet, 1 p.o. daily  8. losartan-hydrochlorothiazide 100-25 mg Tablet, 1 p.o. daily  9. lovastatin 20 mg Tablet, 1 p.o. daily  10. Amaryl 2 mg tablet, BID  11. nitroglycerin 0.4 mg tablet, sublingual, Take as directed ____________________________ CHIEF COMPLAINTS  Followup of CAD,Native ____________________________ HISTORY OF PRESENT ILLNESS  Patient seen for cardiac followup. He has had a good year since he was previously here. He denies angina and has no PND, orthopnea, syncope, palpitations, or claudication. His lipids were reviewed today and are under excellent control. He is limited because of very poor vision and asked to walk with assistance. As a result he is not able to get much exercise. He has really not had any other cardiac issues.  ____________________________ PAST HISTORY  Past Medical Illnesses:  hypertension, DM-non-insulin dependent, hyperlipidemia, obesity, hiatal hernia, glaucoma, peripheral neuropathy, cholelithiasis, cervical disc disease;  Cardiovascular Illnesses:   CAD, S/P MI-inferior 1991;  Infectious Diseases:  no previous history of significant infectious diseases;  Surgical Procedures:  CABG w LIMA to LAD, SVG to int, SVG to OM, SVG to PD-PL 04/29/2000 Dr. Cyndia Bent, parotid tumor 1981, cholecystectomy (lap) 2008;  Trauma History:  no previous history of significant trauma;  Cardiology Procedures-Invasive:  cardiac cath (left) [place], [place];  Cardiology Procedures-Noninvasive:  adenosine cardiolite June 2010;  Cardiac Cath Results:  normal Left main, 70% stenosis mid LAD, 60% stenosis proximal CFX, 90% stenosis distal CFX, 50% stenosis mid RCA, 50% stenosis distal RCA;  LVEF of 67% documented via nuclear study on 07/15/2008 ____________________________ CARDIO-PULMONARY TEST DATES EKG Date:  10/19/2011;   Cardiac Cath Date:  04/26/2000;  CABG: 04/29/2000;  Nuclear Study Date:  07/15/2008;  Chest Xray Date: 02/12/2005;   ____________________________ SOCIAL HISTORY Alcohol Use:  no alcohol use;  Smoking:  used to smoke but quit Prior to 1980;  Diet:  regular diet without modifications;  Lifestyle:  divorced, remarried and separated;  Exercise:  exercise is limited due to physical disability;  Occupation:  disabled;  Residence:  lives with daughter;   ____________________________ REVIEW OF SYSTEMS General:  obesity, weight gain of approximately 10 lbs  Eyes:  vision loss OS, glaucoma, diabetic retinopathy, laser treatments, retinal detachment very poor vision  Respiratory:  mild dyspnea with exertion  Cardiovascular:  please review HPI  Abdominal:  constipation  Genitourinary-Male:  erectile dysfunction, tried Viagra but no longer uses  Musculoskeletal:  chronic low back pain  Neurological:  peripheral neuropathy ____________________________ PHYSICAL EXAMINATION VITAL SIGNS  Blood Pressure:  120/64 Sitting, Left arm, regular cuff  , 120/60 Standing, Left arm and regular cuff  Pulse:  82/min. Weight:  209.00 lbs. Height:  66"BMI:  33  Constitutional:  pleasant, moderately obese, African American, male, in no acute distress Skin:  warm and dry to touch, no apparent skin lesions, or masses noted. Head:  normocephalic, normal hair pattern, no masses or tenderness ENT:  ears, nose and throat unremarkable, poor dentition Neck:  supple, no masses, thyromegaly, JVD. Carotid pulses are full and equal bilaterally without bruits. Chest:  clear to auscultation and percussion, healed median sternotomy scar Cardiac:  regular rhythm, normal S1 and S2, No S3 or S4, no murmurs, gallops or rubs detected. Extremities & Back:  well healed saphenous vein donor site RLE, normal muscle strength and tone., no edema present Neurological:  no gross motor or sensory deficits noted, affect appropriate, oriented x3. ____________________________ MOST RECENT LIPID PANEL 10/18/10  CHOL TOTL 113 mg/dl, LDL 51 calc, HDL 36 mg/dl, TRIGLYCER 131 mg/dl and CHOL/HDL 3.2 (Calc) ____________________________ IMPRESSIONS/PLAN  1. 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 Hyperlipidemia currently at goal 3. Hypertension controlled 4. Obesity with need to lose weight again discussed  Recommendations:  EKG shows a previous inferior infarction. There are no ischemic changes. I will plan to see him again in one year. Call if problems ____________________________ TODAYS ORDERS  1. 12 Lead EKG: Today  2. Return Visit: 1 year                       ____________________________ Cardiology Physician:  Kerry Hough MD [facility]

## 2011-10-25 ENCOUNTER — Other Ambulatory Visit (INDEPENDENT_AMBULATORY_CARE_PROVIDER_SITE_OTHER): Payer: Medicare HMO

## 2011-10-25 DIAGNOSIS — R972 Elevated prostate specific antigen [PSA]: Secondary | ICD-10-CM

## 2011-10-25 DIAGNOSIS — I10 Essential (primary) hypertension: Secondary | ICD-10-CM

## 2011-10-25 DIAGNOSIS — Z79899 Other long term (current) drug therapy: Secondary | ICD-10-CM

## 2011-10-25 DIAGNOSIS — H579 Unspecified disorder of eye and adnexa: Secondary | ICD-10-CM

## 2011-10-25 DIAGNOSIS — E1139 Type 2 diabetes mellitus with other diabetic ophthalmic complication: Secondary | ICD-10-CM

## 2011-10-25 LAB — PSA, TOTAL AND FREE
PSA, Free Pct: 10 % — ABNORMAL LOW (ref >25)
PSA, Free: 0.71 ng/mL

## 2011-10-25 LAB — BASIC METABOLIC PANEL
BUN: 25 mg/dL — ABNORMAL HIGH (ref 6–23)
CO2: 27 mEq/L (ref 19–32)
Chloride: 106 mEq/L (ref 96–112)
GFR: 39.25 mL/min — ABNORMAL LOW (ref >60.00)
Glucose, Bld: 86 mg/dL (ref 70–99)
Potassium: 4.7 mEq/L (ref 3.5–5.1)
Sodium: 139 mEq/L (ref 135–145)

## 2011-11-01 ENCOUNTER — Ambulatory Visit (INDEPENDENT_AMBULATORY_CARE_PROVIDER_SITE_OTHER): Payer: Medicare HMO | Admitting: Internal Medicine

## 2011-11-01 ENCOUNTER — Encounter: Payer: Self-pay | Admitting: Internal Medicine

## 2011-11-01 VITALS — BP 100/50 | HR 80 | Temp 98.2°F | Resp 16 | Wt 208.0 lb

## 2011-11-01 DIAGNOSIS — E785 Hyperlipidemia, unspecified: Secondary | ICD-10-CM

## 2011-11-01 DIAGNOSIS — E1139 Type 2 diabetes mellitus with other diabetic ophthalmic complication: Secondary | ICD-10-CM

## 2011-11-01 DIAGNOSIS — I2510 Atherosclerotic heart disease of native coronary artery without angina pectoris: Secondary | ICD-10-CM

## 2011-11-01 DIAGNOSIS — R972 Elevated prostate specific antigen [PSA]: Secondary | ICD-10-CM

## 2011-11-01 DIAGNOSIS — R42 Dizziness and giddiness: Secondary | ICD-10-CM

## 2011-11-01 DIAGNOSIS — Z23 Encounter for immunization: Secondary | ICD-10-CM

## 2011-11-01 NOTE — Assessment & Plan Note (Signed)
Reduce Toprol dose by 1/2

## 2011-11-01 NOTE — Progress Notes (Signed)
   Subjective:    Patient ID: Caleb Dunn, male    DOB: October 22, 1940, 71 y.o.   MRN: [id number]  HPI   The patient presents for a follow-up of  chronic hypertension, chronic dyslipidemia, type 2 diabetes, CAD controlled with medicines. C/o dry mouth,nose. C/o urinary urgency incontinence, BPH. C/o ED    Review of Systems  Constitutional: Negative for appetite change, fatigue and unexpected weight change.  HENT: Negative for nosebleeds, congestion, sore throat, sneezing, trouble swallowing and neck pain.   Eyes: Positive for visual disturbance (blind). Negative for itching.  Respiratory: Negative for cough.   Cardiovascular: Negative for chest pain, palpitations and leg swelling.  Gastrointestinal: Negative for nausea, diarrhea, blood in stool and abdominal distention.  Genitourinary: Positive for urgency, frequency and decreased urine volume. Negative for hematuria.  Musculoskeletal: Negative for back pain, joint swelling and gait problem.  Skin: Negative for rash.  Neurological: Negative for dizziness, tremors, speech difficulty and weakness.  Psychiatric/Behavioral: Negative for suicidal ideas, disturbed wake/sleep cycle, dysphoric mood and agitation. The patient is not nervous/anxious.    BP Readings from Last 3 Encounters:  11/01/11 100/50  08/17/11 140/76  04/13/11 142/80   Wt Readings from Last 3 Encounters:  11/01/11 208 lb (94.348 kg)  08/17/11 212 lb (96.163 kg)  04/13/11 214 lb (97.07 kg)        Objective:   Physical Exam  Constitutional: He is oriented to person, place, and time. He appears well-developed.  HENT:  Mouth/Throat: Oropharynx is clear and moist.  Eyes: Conjunctivae normal are normal. Right eye exhibits no discharge. Left eye exhibits no discharge.       Blind   Neck: Normal range of motion. No JVD present. No thyromegaly present.  Cardiovascular: Normal rate, regular rhythm, normal heart sounds and intact distal pulses.  Exam reveals no gallop  and no friction rub.   No murmur heard. Pulmonary/Chest: Effort normal and breath sounds normal. No respiratory distress. He has no wheezes. He has no rales. He exhibits no tenderness.  Abdominal: Soft. Bowel sounds are normal. He exhibits no distension and no mass. There is no tenderness. There is no rebound and no guarding.  Genitourinary: Rectum normal. Guaiac negative stool.       Prostate is 1+  Musculoskeletal: Normal range of motion. He exhibits no edema and no tenderness.  Lymphadenopathy:    He has no cervical adenopathy.  Neurological: He is alert and oriented to person, place, and time. He has normal reflexes. No cranial nerve deficit. He exhibits normal muscle tone. Coordination normal.  Skin: Skin is warm and dry. No rash noted.  Psychiatric: He has a normal mood and affect. His behavior is normal. Judgment and thought content normal.   Lab Results  Component Value Date   WBC 8.0 08/13/2011   HGB 13.4 08/13/2011   HCT 40.5 08/13/2011   PLT 246.0 08/13/2011   GLUCOSE 86 10/25/2011   CHOL 118 08/13/2011   TRIG 118.0 08/13/2011   HDL 35.70* 08/13/2011   LDLCALC 59 08/13/2011   ALT 20 08/13/2011   AST 16 08/13/2011   NA 139 10/25/2011   K 4.7 10/25/2011   CL 106 10/25/2011   CREATININE 2.1* 10/25/2011   BUN 25* 10/25/2011   CO2 27 10/25/2011   TSH 0.80 08/13/2011   PSA 7.11* 10/25/2011   HGBA1C 7.5* 10/25/2011           Assessment & Plan:

## 2011-11-01 NOTE — Patient Instructions (Signed)
Take Toprol XL 1/2 tab a day

## 2011-11-01 NOTE — Assessment & Plan Note (Signed)
Urol consult

## 2011-11-01 NOTE — Assessment & Plan Note (Signed)
No angina 

## 2011-11-01 NOTE — Assessment & Plan Note (Signed)
Continue with current prescription therapy as reflected on the Med list.  

## 2011-11-01 NOTE — Assessment & Plan Note (Signed)
Better Continue with current prescription therapy as reflected on the Med list.  

## 2012-01-15 ENCOUNTER — Other Ambulatory Visit: Payer: Self-pay | Admitting: *Deleted

## 2012-01-15 MED ORDER — METFORMIN HCL 1000 MG PO TABS: 1000.0000 mg | ORAL_TABLET | Freq: Two times a day (BID) | ORAL | Status: DC

## 2012-01-15 MED ORDER — VERAPAMIL HCL ER 180 MG PO CP24: 180.0000 mg | ORAL_CAPSULE | Freq: Every day | ORAL | Status: DC

## 2012-01-15 MED ORDER — LOSARTAN POTASSIUM-HCTZ 100-25 MG PO TABS: 1.0000 | ORAL_TABLET | Freq: Every day | ORAL | Status: DC

## 2012-01-15 MED ORDER — GLIMEPIRIDE 2 MG PO TABS: 2.0000 mg | ORAL_TABLET | Freq: Two times a day (BID) | ORAL | Status: DC

## 2012-01-15 MED ORDER — OMEPRAZOLE 20 MG PO CPDR: 40.0000 mg | DELAYED_RELEASE_CAPSULE | Freq: Every day | ORAL | Status: DC

## 2012-01-15 MED ORDER — FINASTERIDE 5 MG PO TABS: 5.0000 mg | ORAL_TABLET | Freq: Every day | ORAL | Status: DC

## 2012-01-15 MED ORDER — LOVASTATIN 20 MG PO TABS: 20.0000 mg | ORAL_TABLET | Freq: Every day | ORAL | Status: DC

## 2012-01-15 MED ORDER — TAMSULOSIN HCL 0.4 MG PO CAPS: 0.4000 mg | ORAL_CAPSULE | Freq: Every day | ORAL | Status: DC

## 2012-01-15 MED ORDER — METOPROLOL SUCCINATE ER 50 MG PO TB24: 50.0000 mg | ORAL_TABLET | Freq: Every day | ORAL | Status: DC

## 2012-02-27 ENCOUNTER — Other Ambulatory Visit (INDEPENDENT_AMBULATORY_CARE_PROVIDER_SITE_OTHER): Payer: Medicare HMO

## 2012-02-27 DIAGNOSIS — I2510 Atherosclerotic heart disease of native coronary artery without angina pectoris: Secondary | ICD-10-CM

## 2012-02-27 DIAGNOSIS — E1139 Type 2 diabetes mellitus with other diabetic ophthalmic complication: Secondary | ICD-10-CM

## 2012-02-27 DIAGNOSIS — R42 Dizziness and giddiness: Secondary | ICD-10-CM

## 2012-02-27 LAB — BASIC METABOLIC PANEL
BUN: 23 mg/dL (ref 6–23)
Calcium: 9.2 mg/dL (ref 8.4–10.5)
Creatinine, Ser: 2.2 mg/dL — ABNORMAL HIGH (ref 0.4–1.5)
GFR: 37.39 mL/min — ABNORMAL LOW (ref >60.00)
Glucose, Bld: 58 mg/dL — ABNORMAL LOW (ref 70–99)
Sodium: 137 mEq/L (ref 135–145)

## 2012-03-04 ENCOUNTER — Ambulatory Visit (INDEPENDENT_AMBULATORY_CARE_PROVIDER_SITE_OTHER): Payer: Medicare HMO | Admitting: Internal Medicine

## 2012-03-04 ENCOUNTER — Encounter: Payer: Self-pay | Admitting: Internal Medicine

## 2012-03-04 VITALS — BP 120/70 | HR 68 | Temp 97.8°F | Resp 16 | Wt 206.0 lb

## 2012-03-04 DIAGNOSIS — R7309 Other abnormal glucose: Secondary | ICD-10-CM

## 2012-03-04 DIAGNOSIS — N320 Bladder-neck obstruction: Secondary | ICD-10-CM

## 2012-03-04 DIAGNOSIS — R739 Hyperglycemia, unspecified: Secondary | ICD-10-CM

## 2012-03-04 DIAGNOSIS — R972 Elevated prostate specific antigen [PSA]: Secondary | ICD-10-CM

## 2012-03-04 DIAGNOSIS — I2510 Atherosclerotic heart disease of native coronary artery without angina pectoris: Secondary | ICD-10-CM

## 2012-03-04 DIAGNOSIS — N529 Male erectile dysfunction, unspecified: Secondary | ICD-10-CM

## 2012-03-04 DIAGNOSIS — E785 Hyperlipidemia, unspecified: Secondary | ICD-10-CM

## 2012-03-04 NOTE — Assessment & Plan Note (Signed)
Continue with current prescription therapy as reflected on the Med list.  

## 2012-03-04 NOTE — Assessment & Plan Note (Signed)
He will re-sch prostate bx with dr Gaynelle Arabian

## 2012-03-04 NOTE — Progress Notes (Signed)
   Subjective:   HPI   The patient presents for a follow-up of  chronic hypertension, chronic dyslipidemia, type 2 diabetes, CAD controlled with medicines. C/o dry mouth,nose. C/o urinary urgency incontinence, BPH and elev PSA. C/o ED    Review of Systems  Constitutional: Negative for appetite change, fatigue and unexpected weight change.  HENT: Negative for nosebleeds, congestion, sore throat, sneezing, trouble swallowing and neck pain.   Eyes: Positive for visual disturbance (blind). Negative for itching.  Respiratory: Negative for cough.   Cardiovascular: Negative for chest pain, palpitations and leg swelling.  Gastrointestinal: Negative for nausea, diarrhea, blood in stool and abdominal distention.  Genitourinary: Positive for urgency, frequency and decreased urine volume. Negative for hematuria.  Musculoskeletal: Negative for back pain, joint swelling and gait problem.  Skin: Negative for rash.  Neurological: Negative for dizziness, tremors, speech difficulty and weakness.  Psychiatric/Behavioral: Negative for suicidal ideas, sleep disturbance, dysphoric mood and agitation. The patient is not nervous/anxious.    BP Readings from Last 3 Encounters:  03/04/12 120/70  11/01/11 100/50  08/17/11 140/76   Wt Readings from Last 3 Encounters:  03/04/12 206 lb (93.441 kg)  11/01/11 208 lb (94.348 kg)  08/17/11 212 lb (96.163 kg)        Objective:   Physical Exam  Constitutional: He is oriented to person, place, and time. He appears well-developed.  HENT:  Mouth/Throat: Oropharynx is clear and moist.  Eyes: Conjunctivae are normal. Right eye exhibits no discharge. Left eye exhibits no discharge.  Blind   Neck: Normal range of motion. No JVD present. No thyromegaly present.  Cardiovascular: Normal rate, regular rhythm, normal heart sounds and intact distal pulses.  Exam reveals no gallop and no friction rub.   No murmur heard. Pulmonary/Chest: Effort normal and breath sounds  normal. No respiratory distress. He has no wheezes. He has no rales. He exhibits no tenderness.  Abdominal: Soft. Bowel sounds are normal. He exhibits no distension and no mass. There is no tenderness. There is no rebound and no guarding.  Genitourinary: Rectum normal. Guaiac negative stool.  Prostate is 1+  Musculoskeletal: Normal range of motion. He exhibits no edema and no tenderness.  Lymphadenopathy:    He has no cervical adenopathy.  Neurological: He is alert and oriented to person, place, and time. He has normal reflexes. No cranial nerve deficit. He exhibits normal muscle tone. Coordination normal.  Skin: Skin is warm and dry. No rash noted.  Psychiatric: He has a normal mood and affect. His behavior is normal. Judgment and thought content normal.   Lab Results  Component Value Date   WBC 8.0 08/13/2011   HGB 13.4 08/13/2011   HCT 40.5 08/13/2011   PLT 246.0 08/13/2011   GLUCOSE 58* 02/27/2012   CHOL 118 08/13/2011   TRIG 118.0 08/13/2011   HDL 35.70* 08/13/2011   LDLCALC 59 08/13/2011   ALT 20 08/13/2011   AST 16 08/13/2011   NA 137 02/27/2012   K 4.6 02/27/2012   CL 104 02/27/2012   CREATININE 2.2* 02/27/2012   BUN 23 02/27/2012   CO2 25 02/27/2012   TSH 0.80 08/13/2011   PSA 7.11* 10/25/2011   HGBA1C 6.8* 02/27/2012           Assessment & Plan:

## 2012-03-04 NOTE — Assessment & Plan Note (Signed)
Discussed Urol ref

## 2012-05-26 ENCOUNTER — Telehealth: Payer: Self-pay | Admitting: *Deleted

## 2012-05-26 NOTE — Telephone Encounter (Signed)
Pt is requesting to speak with someone regarding his bill.

## 2012-06-30 ENCOUNTER — Other Ambulatory Visit (INDEPENDENT_AMBULATORY_CARE_PROVIDER_SITE_OTHER): Payer: Medicare HMO

## 2012-06-30 DIAGNOSIS — R972 Elevated prostate specific antigen [PSA]: Secondary | ICD-10-CM

## 2012-06-30 DIAGNOSIS — E11349 Type 2 diabetes mellitus with severe nonproliferative diabetic retinopathy without macular edema: Secondary | ICD-10-CM

## 2012-06-30 DIAGNOSIS — R7309 Other abnormal glucose: Secondary | ICD-10-CM

## 2012-06-30 DIAGNOSIS — N529 Male erectile dysfunction, unspecified: Secondary | ICD-10-CM

## 2012-06-30 DIAGNOSIS — R739 Hyperglycemia, unspecified: Secondary | ICD-10-CM

## 2012-06-30 DIAGNOSIS — E1139 Type 2 diabetes mellitus with other diabetic ophthalmic complication: Secondary | ICD-10-CM

## 2012-06-30 DIAGNOSIS — E785 Hyperlipidemia, unspecified: Secondary | ICD-10-CM

## 2012-06-30 DIAGNOSIS — N320 Bladder-neck obstruction: Secondary | ICD-10-CM

## 2012-06-30 DIAGNOSIS — I2510 Atherosclerotic heart disease of native coronary artery without angina pectoris: Secondary | ICD-10-CM

## 2012-06-30 LAB — BASIC METABOLIC PANEL
BUN: 26 mg/dL — ABNORMAL HIGH (ref 6–23)
Calcium: 8.9 mg/dL (ref 8.4–10.5)
Creatinine, Ser: 2.2 mg/dL — ABNORMAL HIGH (ref 0.4–1.5)
GFR: 37.55 mL/min — ABNORMAL LOW (ref >60.00)
Glucose, Bld: 142 mg/dL — ABNORMAL HIGH (ref 70–99)

## 2012-06-30 LAB — HEMOGLOBIN A1C: Hgb A1c MFr Bld: 6.6 % — ABNORMAL HIGH (ref 4.6–6.5)

## 2012-07-03 ENCOUNTER — Encounter: Payer: Self-pay | Admitting: Internal Medicine

## 2012-07-03 ENCOUNTER — Ambulatory Visit (INDEPENDENT_AMBULATORY_CARE_PROVIDER_SITE_OTHER): Payer: Medicare HMO | Admitting: Internal Medicine

## 2012-07-03 DIAGNOSIS — E1139 Type 2 diabetes mellitus with other diabetic ophthalmic complication: Secondary | ICD-10-CM

## 2012-07-03 DIAGNOSIS — E785 Hyperlipidemia, unspecified: Secondary | ICD-10-CM

## 2012-07-03 DIAGNOSIS — R972 Elevated prostate specific antigen [PSA]: Secondary | ICD-10-CM

## 2012-07-03 DIAGNOSIS — N320 Bladder-neck obstruction: Secondary | ICD-10-CM

## 2012-07-03 DIAGNOSIS — E11349 Type 2 diabetes mellitus with severe nonproliferative diabetic retinopathy without macular edema: Secondary | ICD-10-CM

## 2012-07-03 DIAGNOSIS — I2510 Atherosclerotic heart disease of native coronary artery without angina pectoris: Secondary | ICD-10-CM

## 2012-07-03 DIAGNOSIS — I119 Hypertensive heart disease without heart failure: Secondary | ICD-10-CM

## 2012-07-03 MED ORDER — ERYTHROMYCIN 5 MG/GM OP OINT: TOPICAL_OINTMENT | Freq: Three times a day (TID) | OPHTHALMIC | Status: DC

## 2012-07-03 MED ORDER — ERYTHROMYCIN 5 MG/GM OP OINT: TOPICAL_OINTMENT | Freq: Every day | OPHTHALMIC | Status: DC

## 2012-07-03 NOTE — Assessment & Plan Note (Signed)
Continue with current prescription therapy as reflected on the Med list.  

## 2012-07-03 NOTE — Progress Notes (Signed)
   Subjective:   HPI   The patient presents for a follow-up of  chronic hypertension, chronic dyslipidemia, type 2 diabetes, CAD controlled with medicines. C/o dry mouth - chronic. F/u urinary urgency incontinence, BPH and elev PSA. C/o L eye hurts x 3 d    Review of Systems  Constitutional: Negative for appetite change, fatigue and unexpected weight change.  HENT: Negative for nosebleeds, congestion, sore throat, sneezing, trouble swallowing and neck pain.   Eyes: Positive for visual disturbance (blind). Negative for itching.  Respiratory: Negative for cough.   Cardiovascular: Negative for chest pain, palpitations and leg swelling.  Gastrointestinal: Negative for nausea, diarrhea, blood in stool and abdominal distention.  Genitourinary: Positive for urgency, frequency and decreased urine volume. Negative for hematuria.  Musculoskeletal: Negative for back pain, joint swelling and gait problem.  Skin: Negative for rash.  Neurological: Negative for dizziness, tremors, speech difficulty and weakness.  Psychiatric/Behavioral: Negative for suicidal ideas, sleep disturbance, dysphoric mood and agitation. The patient is not nervous/anxious.    BP Readings from Last 3 Encounters:  07/03/12 130/80  03/04/12 120/70  11/01/11 100/50   Wt Readings from Last 3 Encounters:  07/03/12 206 lb (93.441 kg)  03/04/12 206 lb (93.441 kg)  11/01/11 208 lb (94.348 kg)        Objective:   Physical Exam  Constitutional: He is oriented to person, place, and time. He appears well-developed.  HENT:  Mouth/Throat: Oropharynx is clear and moist.  Eyes: Conjunctivae are normal. Right eye exhibits no discharge. Left eye exhibits no discharge.  Blind   Neck: Normal range of motion. No JVD present. No thyromegaly present.  Cardiovascular: Normal rate, regular rhythm, normal heart sounds and intact distal pulses.  Exam reveals no gallop and no friction rub.   No murmur heard. Pulmonary/Chest: Effort  normal and breath sounds normal. No respiratory distress. He has no wheezes. He has no rales. He exhibits no tenderness.  Abdominal: Soft. Bowel sounds are normal. He exhibits no distension and no mass. There is no tenderness. There is no rebound and no guarding.  Genitourinary: Rectum normal. Guaiac negative stool.  Prostate is 1+  Musculoskeletal: Normal range of motion. He exhibits no edema and no tenderness.  Lymphadenopathy:    He has no cervical adenopathy.  Neurological: He is alert and oriented to person, place, and time. He has normal reflexes. No cranial nerve deficit. He exhibits normal muscle tone. Coordination normal.  Skin: Skin is warm and dry. No rash noted.  Psychiatric: He has a normal mood and affect. His behavior is normal. Judgment and thought content normal.  L eye is w/conjunctivitis  Lab Results  Component Value Date   WBC 8.0 08/13/2011   HGB 13.4 08/13/2011   HCT 40.5 08/13/2011   PLT 246.0 08/13/2011   GLUCOSE 142* 06/30/2012   CHOL 118 08/13/2011   TRIG 118.0 08/13/2011   HDL 35.70* 08/13/2011   LDLCALC 59 08/13/2011   ALT 20 08/13/2011   AST 16 08/13/2011   NA 136 06/30/2012   K 4.1 06/30/2012   CL 102 06/30/2012   CREATININE 2.2* 06/30/2012   BUN 26* 06/30/2012   CO2 21 06/30/2012   TSH 0.80 08/13/2011   PSA 7.11* 10/25/2011   HGBA1C 6.6* 06/30/2012           Assessment & Plan:

## 2012-08-06 ENCOUNTER — Telehealth: Payer: Self-pay | Admitting: *Deleted

## 2012-08-06 NOTE — Telephone Encounter (Signed)
Pt called requesting Rx for Glucometer.  Please advise

## 2012-08-06 NOTE — Telephone Encounter (Signed)
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

## 2012-08-07 MED ORDER — ONETOUCH ULTRASMART W/DEVICE KIT: PACK | Status: DC

## 2012-08-07 MED ORDER — ONETOUCH LANCETS MISC: Status: DC

## 2012-08-07 MED ORDER — GLUCOSE BLOOD VI STRP: ORAL_STRIP | Status: DC

## 2012-08-07 NOTE — Telephone Encounter (Signed)
Notified pt to verify pharmacy. Pt is wanting rx's sent [facility]...lmb

## 2012-08-22 ENCOUNTER — Ambulatory Visit (INDEPENDENT_AMBULATORY_CARE_PROVIDER_SITE_OTHER): Payer: Medicare HMO | Admitting: Internal Medicine

## 2012-08-22 ENCOUNTER — Encounter: Payer: Self-pay | Admitting: Internal Medicine

## 2012-08-22 VITALS — BP 120/56 | HR 64 | Temp 97.4°F | Resp 16 | Wt 206.0 lb

## 2012-08-22 DIAGNOSIS — I119 Hypertensive heart disease without heart failure: Secondary | ICD-10-CM

## 2012-08-22 DIAGNOSIS — E1139 Type 2 diabetes mellitus with other diabetic ophthalmic complication: Secondary | ICD-10-CM

## 2012-08-22 DIAGNOSIS — E11349 Type 2 diabetes mellitus with severe nonproliferative diabetic retinopathy without macular edema: Secondary | ICD-10-CM

## 2012-08-22 DIAGNOSIS — H5316 Psychophysical visual disturbances: Secondary | ICD-10-CM

## 2012-08-22 DIAGNOSIS — E669 Obesity, unspecified: Secondary | ICD-10-CM

## 2012-08-22 DIAGNOSIS — I2510 Atherosclerotic heart disease of native coronary artery without angina pectoris: Secondary | ICD-10-CM

## 2012-08-22 MED ORDER — GLUCOSE BLOOD VI STRP: ORAL_STRIP | Status: DC

## 2012-08-22 MED ORDER — PRODIGY BLOOD GLUCOSE MONITOR DEVI: Status: DC

## 2012-08-22 MED ORDER — GLIMEPIRIDE 2 MG PO TABS: 2.0000 mg | ORAL_TABLET | Freq: Two times a day (BID) | ORAL | Status: DC

## 2012-08-22 MED ORDER — PRODIGY LANCETS 28G MISC: Status: DC

## 2012-08-22 NOTE — Assessment & Plan Note (Signed)
Wt Readings from Last 3 Encounters:  08/22/12 206 lb (93.441 kg)  07/03/12 206 lb (93.441 kg)  03/04/12 206 lb (93.441 kg)

## 2012-08-22 NOTE — Progress Notes (Signed)
   Subjective:   HPI  C/o seeng people in the room at times F/u memory loss at times x few days. His sugars are ok.  F/u seeing little children in his house  F/u poor balance  The patient presents for a follow-up of  chronic hypertension, chronic dyslipidemia, type 2 diabetes, CAD controlled with medicines. C/o dry mouth - chronic. F/u urinary urgency incontinence, BPH and elev PSA.    Review of Systems  Constitutional: Negative for appetite change, fatigue and unexpected weight change.  HENT: Negative for nosebleeds, congestion, sore throat, sneezing, trouble swallowing and neck pain.   Eyes: Positive for visual disturbance (blind). Negative for itching.  Respiratory: Negative for cough.   Cardiovascular: Negative for chest pain, palpitations and leg swelling.  Gastrointestinal: Negative for nausea, diarrhea, blood in stool and abdominal distention.  Genitourinary: Positive for urgency, frequency and decreased urine volume. Negative for hematuria.  Musculoskeletal: Negative for back pain, joint swelling and gait problem.  Skin: Negative for rash.  Neurological: Negative for dizziness, tremors, speech difficulty and weakness.  Psychiatric/Behavioral: Negative for suicidal ideas, sleep disturbance, dysphoric mood and agitation. The patient is not nervous/anxious.    BP Readings from Last 3 Encounters:  08/22/12 120/56  07/03/12 130/80  03/04/12 120/70   Wt Readings from Last 3 Encounters:  08/22/12 206 lb (93.441 kg)  07/03/12 206 lb (93.441 kg)  03/04/12 206 lb (93.441 kg)        Objective:   Physical Exam  Constitutional: He is oriented to person, place, and time. He appears well-developed.  HENT:  Mouth/Throat: Oropharynx is clear and moist.  Eyes: Conjunctivae are normal. Right eye exhibits no discharge. Left eye exhibits no discharge.  Blind   Neck: Normal range of motion. No JVD present. No thyromegaly present.  Cardiovascular: Normal rate, regular rhythm,  normal heart sounds and intact distal pulses.  Exam reveals no gallop and no friction rub.   No murmur heard. Pulmonary/Chest: Effort normal and breath sounds normal. No respiratory distress. He has no wheezes. He has no rales. He exhibits no tenderness.  Abdominal: Soft. Bowel sounds are normal. He exhibits no distension and no mass. There is no tenderness. There is no rebound and no guarding.  Genitourinary: Rectum normal. Guaiac negative stool.  Musculoskeletal: Normal range of motion. He exhibits no edema and no tenderness.  Lymphadenopathy:    He has no cervical adenopathy.  Neurological: He is alert and oriented to person, place, and time. He has normal reflexes. No cranial nerve deficit. He exhibits normal muscle tone. Coordination normal.  Skin: Skin is warm and dry. No rash noted.  Psychiatric: He has a normal mood and affect. His behavior is normal. Judgment and thought content normal.    Lab Results  Component Value Date   WBC 8.0 08/13/2011   HGB 13.4 08/13/2011   HCT 40.5 08/13/2011   PLT 246.0 08/13/2011   GLUCOSE 142* 06/30/2012   CHOL 118 08/13/2011   TRIG 118.0 08/13/2011   HDL 35.70* 08/13/2011   LDLCALC 59 08/13/2011   ALT 20 08/13/2011   AST 16 08/13/2011   NA 136 06/30/2012   K 4.1 06/30/2012   CL 102 06/30/2012   CREATININE 2.2* 06/30/2012   BUN 26* 06/30/2012   CO2 21 06/30/2012   TSH 0.80 08/13/2011   PSA 7.11* 10/25/2011   HGBA1C 6.6* 06/30/2012           Assessment & Plan:

## 2012-08-22 NOTE — Assessment & Plan Note (Signed)
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

## 2012-08-22 NOTE — Assessment & Plan Note (Signed)
Continue with current prescription therapy as reflected on the Med list. [facility] Labs

## 2012-08-22 NOTE — Patient Instructions (Addendum)
If sugars are low - start Glimepiride 1 tab daily instead of twice daily

## 2012-08-24 ENCOUNTER — Encounter: Payer: Self-pay | Admitting: Internal Medicine

## 2012-08-24 NOTE — Assessment & Plan Note (Signed)
Continue with current prescription therapy as reflected on the Med list.  

## 2012-08-28 ENCOUNTER — Telehealth: Payer: Self-pay | Admitting: *Deleted

## 2012-08-28 NOTE — Telephone Encounter (Signed)
Pt called states he would like Dr Alain Marion to send his diabetic supplies Rx to [facility].  States he has spoke with Dr Alain Marion about this before.  Please advise

## 2012-08-29 NOTE — Telephone Encounter (Signed)
pls do Thx

## 2012-08-29 NOTE — Telephone Encounter (Signed)
Spoke with pt requested information to [facility], he states he would get one of his children to call us to give information needed.

## 2012-09-04 ENCOUNTER — Telehealth: Payer: Self-pay | Admitting: *Deleted

## 2012-09-04 NOTE — Telephone Encounter (Signed)
Faxed rx to [phone].  Pt advised

## 2012-09-26 ENCOUNTER — Other Ambulatory Visit (INDEPENDENT_AMBULATORY_CARE_PROVIDER_SITE_OTHER): Payer: Medicare HMO

## 2012-09-26 DIAGNOSIS — E1139 Type 2 diabetes mellitus with other diabetic ophthalmic complication: Secondary | ICD-10-CM

## 2012-09-26 DIAGNOSIS — E785 Hyperlipidemia, unspecified: Secondary | ICD-10-CM

## 2012-09-26 DIAGNOSIS — I119 Hypertensive heart disease without heart failure: Secondary | ICD-10-CM

## 2012-09-26 DIAGNOSIS — N320 Bladder-neck obstruction: Secondary | ICD-10-CM

## 2012-09-26 DIAGNOSIS — E11349 Type 2 diabetes mellitus with severe nonproliferative diabetic retinopathy without macular edema: Secondary | ICD-10-CM

## 2012-09-26 DIAGNOSIS — I2510 Atherosclerotic heart disease of native coronary artery without angina pectoris: Secondary | ICD-10-CM

## 2012-09-26 DIAGNOSIS — R972 Elevated prostate specific antigen [PSA]: Secondary | ICD-10-CM

## 2012-09-26 LAB — BASIC METABOLIC PANEL
Calcium: 9.4 mg/dL (ref 8.4–10.5)
Creatinine, Ser: 2 mg/dL — ABNORMAL HIGH (ref 0.4–1.5)
GFR: 43.33 mL/min — ABNORMAL LOW (ref >60.00)
Glucose, Bld: 86 mg/dL (ref 70–99)
Sodium: 136 mEq/L (ref 135–145)

## 2012-09-26 LAB — HEMOGLOBIN A1C: Hgb A1c MFr Bld: 6.9 % — ABNORMAL HIGH (ref 4.6–6.5)

## 2012-10-03 ENCOUNTER — Encounter: Payer: Self-pay | Admitting: Internal Medicine

## 2012-10-03 ENCOUNTER — Ambulatory Visit (INDEPENDENT_AMBULATORY_CARE_PROVIDER_SITE_OTHER): Payer: Medicare HMO | Admitting: Internal Medicine

## 2012-10-03 ENCOUNTER — Telehealth: Payer: Self-pay | Admitting: Internal Medicine

## 2012-10-03 VITALS — BP 140/72 | HR 80 | Temp 97.3°F | Resp 16 | Wt 206.0 lb

## 2012-10-03 DIAGNOSIS — H5316 Psychophysical visual disturbances: Secondary | ICD-10-CM

## 2012-10-03 DIAGNOSIS — E1139 Type 2 diabetes mellitus with other diabetic ophthalmic complication: Secondary | ICD-10-CM

## 2012-10-03 DIAGNOSIS — N320 Bladder-neck obstruction: Secondary | ICD-10-CM

## 2012-10-03 DIAGNOSIS — Z23 Encounter for immunization: Secondary | ICD-10-CM

## 2012-10-03 DIAGNOSIS — R972 Elevated prostate specific antigen [PSA]: Secondary | ICD-10-CM

## 2012-10-03 DIAGNOSIS — E785 Hyperlipidemia, unspecified: Secondary | ICD-10-CM

## 2012-10-03 DIAGNOSIS — N184 Chronic kidney disease, stage 4 (severe): Secondary | ICD-10-CM

## 2012-10-03 DIAGNOSIS — E669 Obesity, unspecified: Secondary | ICD-10-CM

## 2012-10-03 NOTE — Assessment & Plan Note (Signed)
Continue with current prescription therapy as reflected on the Med list.  

## 2012-10-03 NOTE — Progress Notes (Signed)
   Subjective:   HPI  F/u seeng people in the room at times - it stopped F/u memory loss at times x few days. His sugars are ok.  F/u seeing little children in his house  F/u poor balance  The patient presents for a follow-up of  chronic hypertension, chronic dyslipidemia, type 2 diabetes, CAD controlled with medicines. C/o dry mouth - chronic. F/u urinary urgency incontinence, BPH and elev PSA.    Review of Systems  Constitutional: Negative for appetite change, fatigue and unexpected weight change.  HENT: Negative for nosebleeds, congestion, sore throat, sneezing, trouble swallowing and neck pain.   Eyes: Positive for visual disturbance (blind). Negative for itching.  Respiratory: Negative for cough.   Cardiovascular: Negative for chest pain, palpitations and leg swelling.  Gastrointestinal: Negative for nausea, diarrhea, blood in stool and abdominal distention.  Genitourinary: Positive for urgency, frequency and decreased urine volume. Negative for hematuria.  Musculoskeletal: Negative for back pain, joint swelling and gait problem.  Skin: Negative for rash.  Neurological: Negative for dizziness, tremors, speech difficulty and weakness.  Psychiatric/Behavioral: Negative for suicidal ideas, sleep disturbance, dysphoric mood and agitation. The patient is not nervous/anxious.    BP Readings from Last 3 Encounters:  10/03/12 140/72  08/22/12 120/56  07/03/12 130/80   Wt Readings from Last 3 Encounters:  10/03/12 206 lb (93.441 kg)  08/22/12 206 lb (93.441 kg)  07/03/12 206 lb (93.441 kg)        Objective:   Physical Exam  Constitutional: He is oriented to person, place, and time. He appears well-developed.  HENT:  Mouth/Throat: Oropharynx is clear and moist.  Eyes: Conjunctivae are normal. Right eye exhibits no discharge. Left eye exhibits no discharge.  Blind   Neck: Normal range of motion. No JVD present. No thyromegaly present.  Cardiovascular: Normal rate, regular  rhythm, normal heart sounds and intact distal pulses.  Exam reveals no gallop and no friction rub.   No murmur heard. Pulmonary/Chest: Effort normal and breath sounds normal. No respiratory distress. He has no wheezes. He has no rales. He exhibits no tenderness.  Abdominal: Soft. Bowel sounds are normal. He exhibits no distension and no mass. There is no tenderness. There is no rebound and no guarding.  Genitourinary: Rectum normal. Guaiac negative stool.  Musculoskeletal: Normal range of motion. He exhibits no edema and no tenderness.  Lymphadenopathy:    He has no cervical adenopathy.  Neurological: He is alert and oriented to person, place, and time. He has normal reflexes. No cranial nerve deficit. He exhibits normal muscle tone. Coordination normal.  Skin: Skin is warm and dry. No rash noted.  Psychiatric: He has a normal mood and affect. His behavior is normal. Judgment and thought content normal.    Lab Results  Component Value Date   WBC 8.0 08/13/2011   HGB 13.4 08/13/2011   HCT 40.5 08/13/2011   PLT 246.0 08/13/2011   GLUCOSE 86 09/26/2012   CHOL 118 08/13/2011   TRIG 118.0 08/13/2011   HDL 35.70* 08/13/2011   LDLCALC 59 08/13/2011   ALT 20 08/13/2011   AST 16 08/13/2011   NA 136 09/26/2012   K 4.6 09/26/2012   CL 103 09/26/2012   CREATININE 2.0* 09/26/2012   BUN 20 09/26/2012   CO2 29 09/26/2012   TSH 0.80 08/13/2011   PSA 7.11* 10/25/2011   HGBA1C 6.9* 09/26/2012           Assessment & Plan:

## 2012-10-03 NOTE — Assessment & Plan Note (Signed)
Watching  

## 2012-10-03 NOTE — Assessment & Plan Note (Signed)
Wt Readings from Last 3 Encounters:  10/03/12 206 lb (93.441 kg)  08/22/12 206 lb (93.441 kg)  07/03/12 206 lb (93.441 kg)

## 2012-10-03 NOTE — Assessment & Plan Note (Signed)
Chronic Labs Continue with current prescription therapy as reflected on the Med list.

## 2012-10-03 NOTE — Assessment & Plan Note (Signed)
Better  

## 2012-10-03 NOTE — Assessment & Plan Note (Signed)
Nocturia 5-6/night

## 2012-10-03 NOTE — Telephone Encounter (Signed)
error 

## 2012-11-19 ENCOUNTER — Other Ambulatory Visit: Payer: Self-pay

## 2012-11-19 MED ORDER — FINASTERIDE 5 MG PO TABS: 5.0000 mg | ORAL_TABLET | Freq: Every day | ORAL | Status: DC

## 2012-11-19 MED ORDER — METOPROLOL SUCCINATE ER 50 MG PO TB24: 50.0000 mg | ORAL_TABLET | Freq: Every day | ORAL | Status: DC

## 2012-11-19 MED ORDER — LOVASTATIN 20 MG PO TABS: 20.0000 mg | ORAL_TABLET | Freq: Every day | ORAL | Status: DC

## 2012-11-19 MED ORDER — LOSARTAN POTASSIUM-HCTZ 100-25 MG PO TABS: 1.0000 | ORAL_TABLET | Freq: Every day | ORAL | Status: DC

## 2012-11-19 MED ORDER — OMEPRAZOLE 20 MG PO CPDR: 40.0000 mg | DELAYED_RELEASE_CAPSULE | Freq: Every day | ORAL | Status: DC

## 2012-11-19 MED ORDER — VERAPAMIL HCL ER 180 MG PO CP24: 180.0000 mg | ORAL_CAPSULE | Freq: Every day | ORAL | Status: DC

## 2012-11-19 NOTE — Telephone Encounter (Signed)
Rx requests came in for Proscar, Prilosec, Losartan, Metaprolol, Lovastatin, verapamil. Meds were last filled on 01/15/2012 and patient was last seen on 10/03/2012. Refills sent in electronically to [facility].

## 2012-12-29 ENCOUNTER — Other Ambulatory Visit (INDEPENDENT_AMBULATORY_CARE_PROVIDER_SITE_OTHER): Payer: Medicare HMO

## 2012-12-29 DIAGNOSIS — E1139 Type 2 diabetes mellitus with other diabetic ophthalmic complication: Secondary | ICD-10-CM

## 2012-12-29 DIAGNOSIS — N184 Chronic kidney disease, stage 4 (severe): Secondary | ICD-10-CM

## 2012-12-29 DIAGNOSIS — R972 Elevated prostate specific antigen [PSA]: Secondary | ICD-10-CM

## 2012-12-29 LAB — BASIC METABOLIC PANEL
CO2: 26 mEq/L (ref 19–32)
Calcium: 10 mg/dL (ref 8.4–10.5)
Chloride: 100 mEq/L (ref 96–112)
Creatinine, Ser: 2 mg/dL — ABNORMAL HIGH (ref 0.4–1.5)
Potassium: 4.3 mEq/L (ref 3.5–5.1)
Sodium: 133 mEq/L — ABNORMAL LOW (ref 135–145)

## 2013-01-02 ENCOUNTER — Ambulatory Visit (INDEPENDENT_AMBULATORY_CARE_PROVIDER_SITE_OTHER): Payer: Medicare HMO | Admitting: Internal Medicine

## 2013-01-02 ENCOUNTER — Encounter: Payer: Self-pay | Admitting: Internal Medicine

## 2013-01-02 VITALS — BP 120/70 | HR 72 | Temp 97.7°F | Resp 16 | Wt 207.0 lb

## 2013-01-02 DIAGNOSIS — H5316 Psychophysical visual disturbances: Secondary | ICD-10-CM

## 2013-01-02 DIAGNOSIS — E1139 Type 2 diabetes mellitus with other diabetic ophthalmic complication: Secondary | ICD-10-CM

## 2013-01-02 DIAGNOSIS — N184 Chronic kidney disease, stage 4 (severe): Secondary | ICD-10-CM

## 2013-01-02 DIAGNOSIS — I119 Hypertensive heart disease without heart failure: Secondary | ICD-10-CM

## 2013-01-02 DIAGNOSIS — R972 Elevated prostate specific antigen [PSA]: Secondary | ICD-10-CM

## 2013-01-02 DIAGNOSIS — E669 Obesity, unspecified: Secondary | ICD-10-CM

## 2013-01-02 DIAGNOSIS — Z23 Encounter for immunization: Secondary | ICD-10-CM

## 2013-01-02 NOTE — Progress Notes (Signed)
Pre visit review using our clinic review tool, if applicable. No additional management support is needed unless otherwise documented below in the visit note. 

## 2013-01-02 NOTE — Assessment & Plan Note (Signed)
Continue with current prescription therapy as reflected on the Med list.  

## 2013-01-02 NOTE — Progress Notes (Signed)
   Subjective:   HPI  F/u seeng people in the room at times - it stopped F/u memory loss at times x few days. His sugars are ok.  F/u seeing little children in his house  F/u poor balance  The patient presents for a follow-up of  chronic hypertension, chronic dyslipidemia, type 2 diabetes, CAD controlled with medicines. C/o dry mouth - chronic. F/u urinary urgency incontinence, BPH and elev PSA.    Review of Systems  Constitutional: Negative for appetite change, fatigue and unexpected weight change.  HENT: Negative for congestion, nosebleeds, sneezing, sore throat and trouble swallowing.   Eyes: Positive for visual disturbance (blind). Negative for itching.  Respiratory: Negative for cough.   Cardiovascular: Negative for chest pain, palpitations and leg swelling.  Gastrointestinal: Negative for nausea, diarrhea, blood in stool and abdominal distention.  Genitourinary: Positive for urgency, frequency and decreased urine volume. Negative for hematuria.  Musculoskeletal: Negative for back pain, gait problem, joint swelling and neck pain.  Skin: Negative for rash.  Neurological: Negative for dizziness, tremors, speech difficulty and weakness.  Psychiatric/Behavioral: Negative for suicidal ideas, sleep disturbance, dysphoric mood and agitation. The patient is not nervous/anxious.    BP Readings from Last 3 Encounters:  01/02/13 120/70  10/03/12 140/72  08/22/12 120/56   Wt Readings from Last 3 Encounters:  01/02/13 207 lb (93.895 kg)  10/03/12 206 lb (93.441 kg)  08/22/12 206 lb (93.441 kg)        Objective:   Physical Exam  Constitutional: He is oriented to person, place, and time. He appears well-developed.  HENT:  Mouth/Throat: Oropharynx is clear and moist.  Eyes: Conjunctivae are normal. Right eye exhibits no discharge. Left eye exhibits no discharge.  Blind   Neck: Normal range of motion. No JVD present. No thyromegaly present.  Cardiovascular: Normal rate, regular  rhythm, normal heart sounds and intact distal pulses.  Exam reveals no gallop and no friction rub.   No murmur heard. Pulmonary/Chest: Effort normal and breath sounds normal. No respiratory distress. He has no wheezes. He has no rales. He exhibits no tenderness.  Abdominal: Soft. Bowel sounds are normal. He exhibits no distension and no mass. There is no tenderness. There is no rebound and no guarding.  Genitourinary: Rectum normal. Guaiac negative stool.  Musculoskeletal: Normal range of motion. He exhibits no edema and no tenderness.  Lymphadenopathy:    He has no cervical adenopathy.  Neurological: He is alert and oriented to person, place, and time. He has normal reflexes. No cranial nerve deficit. He exhibits normal muscle tone. Coordination normal.  Skin: Skin is warm and dry. No rash noted.  Psychiatric: He has a normal mood and affect. His behavior is normal. Judgment and thought content normal.    Lab Results  Component Value Date   WBC 8.0 08/13/2011   HGB 13.4 08/13/2011   HCT 40.5 08/13/2011   PLT 246.0 08/13/2011   GLUCOSE 134* 12/29/2012   CHOL 118 08/13/2011   TRIG 118.0 08/13/2011   HDL 35.70* 08/13/2011   LDLCALC 59 08/13/2011   ALT 20 08/13/2011   AST 16 08/13/2011   NA 133* 12/29/2012   K 4.3 12/29/2012   CL 100 12/29/2012   CREATININE 2.0* 12/29/2012   BUN 27* 12/29/2012   CO2 26 12/29/2012   TSH 0.80 08/13/2011   PSA 7.46* 12/29/2012   HGBA1C 6.7* 12/29/2012           Assessment & Plan:

## 2013-01-02 NOTE — Assessment & Plan Note (Signed)
PSA monitoring

## 2013-01-02 NOTE — Assessment & Plan Note (Signed)
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

## 2013-01-02 NOTE — Assessment & Plan Note (Signed)
Wt Readings from Last 3 Encounters:  01/02/13 207 lb (93.895 kg)  10/03/12 206 lb (93.441 kg)  08/22/12 206 lb (93.441 kg)

## 2013-01-02 NOTE — Assessment & Plan Note (Signed)
Stable  Monitoring PSA

## 2013-03-16 ENCOUNTER — Telehealth: Payer: Self-pay | Admitting: *Deleted

## 2013-03-16 MED ORDER — METFORMIN HCL 1000 MG PO TABS: 1000.0000 mg | ORAL_TABLET | Freq: Two times a day (BID) | ORAL | Status: DC

## 2013-03-16 NOTE — Telephone Encounter (Signed)
Last OV with PCP 01/02/13  Patient phoned needing metformin sent to local pharmacy versus [facility] mail order rx.  Refilled per protocol.

## 2013-04-27 ENCOUNTER — Other Ambulatory Visit (INDEPENDENT_AMBULATORY_CARE_PROVIDER_SITE_OTHER): Payer: Commercial Managed Care - HMO

## 2013-04-27 DIAGNOSIS — E669 Obesity, unspecified: Secondary | ICD-10-CM

## 2013-04-27 DIAGNOSIS — N184 Chronic kidney disease, stage 4 (severe): Secondary | ICD-10-CM

## 2013-04-27 DIAGNOSIS — I119 Hypertensive heart disease without heart failure: Secondary | ICD-10-CM

## 2013-04-27 DIAGNOSIS — E1139 Type 2 diabetes mellitus with other diabetic ophthalmic complication: Secondary | ICD-10-CM

## 2013-04-27 DIAGNOSIS — H5316 Psychophysical visual disturbances: Secondary | ICD-10-CM

## 2013-04-27 DIAGNOSIS — R972 Elevated prostate specific antigen [PSA]: Secondary | ICD-10-CM

## 2013-04-27 LAB — BASIC METABOLIC PANEL
BUN: 29 mg/dL — AB (ref 6–23)
CO2: 29 mEq/L (ref 19–32)
CREATININE: 2.1 mg/dL — AB (ref 0.4–1.5)
Calcium: 9.7 mg/dL (ref 8.4–10.5)
Chloride: 99 mEq/L (ref 96–112)
GFR: 40.84 mL/min — AB (ref >60.00)
Glucose, Bld: 233 mg/dL — ABNORMAL HIGH (ref 70–99)
Potassium: 5.1 mEq/L (ref 3.5–5.1)
Sodium: 135 mEq/L (ref 135–145)

## 2013-04-27 LAB — HEPATIC FUNCTION PANEL
ALT: 22 U/L (ref 0–53)
AST: 17 U/L (ref 0–37)
Albumin: 3.8 g/dL (ref 3.5–5.2)
Alkaline Phosphatase: 51 U/L (ref 39–117)
BILIRUBIN DIRECT: 0.1 mg/dL (ref 0.0–0.3)
BILIRUBIN TOTAL: 0.6 mg/dL (ref 0.3–1.2)
Total Protein: 8.3 g/dL (ref 6.0–8.3)

## 2013-04-27 LAB — HEMOGLOBIN A1C: HEMOGLOBIN A1C: 6.7 % — AB (ref 4.6–6.5)

## 2013-04-27 LAB — LIPID PANEL
Cholesterol: 128 mg/dL (ref 0–200)
HDL: 41.6 mg/dL (ref >39.00)
LDL CALC: 69 mg/dL (ref 0–99)
TRIGLYCERIDES: 87 mg/dL (ref 0.0–149.0)
Total CHOL/HDL Ratio: 3
VLDL: 17.4 mg/dL (ref 0.0–40.0)

## 2013-04-30 ENCOUNTER — Encounter: Payer: Self-pay | Admitting: Gastroenterology

## 2013-05-05 ENCOUNTER — Encounter: Payer: Self-pay | Admitting: Internal Medicine

## 2013-05-05 ENCOUNTER — Ambulatory Visit (INDEPENDENT_AMBULATORY_CARE_PROVIDER_SITE_OTHER): Payer: Commercial Managed Care - HMO | Admitting: Internal Medicine

## 2013-05-05 VITALS — BP 130/70 | HR 80 | Temp 98.2°F | Resp 16 | Wt 207.0 lb

## 2013-05-05 DIAGNOSIS — K117 Disturbances of salivary secretion: Secondary | ICD-10-CM

## 2013-05-05 DIAGNOSIS — I119 Hypertensive heart disease without heart failure: Secondary | ICD-10-CM

## 2013-05-05 DIAGNOSIS — R682 Dry mouth, unspecified: Secondary | ICD-10-CM | POA: Insufficient documentation

## 2013-05-05 DIAGNOSIS — R42 Dizziness and giddiness: Secondary | ICD-10-CM

## 2013-05-05 DIAGNOSIS — K219 Gastro-esophageal reflux disease without esophagitis: Secondary | ICD-10-CM

## 2013-05-05 DIAGNOSIS — H5316 Psychophysical visual disturbances: Secondary | ICD-10-CM

## 2013-05-05 DIAGNOSIS — E785 Hyperlipidemia, unspecified: Secondary | ICD-10-CM

## 2013-05-05 DIAGNOSIS — E1139 Type 2 diabetes mellitus with other diabetic ophthalmic complication: Secondary | ICD-10-CM

## 2013-05-05 DIAGNOSIS — N320 Bladder-neck obstruction: Secondary | ICD-10-CM

## 2013-05-05 DIAGNOSIS — I2510 Atherosclerotic heart disease of native coronary artery without angina pectoris: Secondary | ICD-10-CM

## 2013-05-05 MED ORDER — LOSARTAN POTASSIUM 50 MG PO TABS: 50.0000 mg | ORAL_TABLET | Freq: Every day | ORAL | Status: DC

## 2013-05-05 MED ORDER — LOSARTAN POTASSIUM 100 MG PO TABS: 100.0000 mg | ORAL_TABLET | Freq: Every day | ORAL | Status: DC

## 2013-05-05 MED ORDER — CLONAZEPAM 0.25 MG PO TBDP: 0.2500 mg | ORAL_TABLET | Freq: Every evening | ORAL | Status: DC | PRN

## 2013-05-05 NOTE — Assessment & Plan Note (Signed)
Continue with current prescription therapy as reflected on the Med list.  

## 2013-05-05 NOTE — Patient Instructions (Signed)
Stop Losartan-HCTZ 100-25 Start Losartan 50 mg a day

## 2013-05-05 NOTE — Assessment & Plan Note (Signed)
Doing well 

## 2013-05-05 NOTE — Assessment & Plan Note (Signed)
Losartan 50

## 2013-05-05 NOTE — Assessment & Plan Note (Addendum)
Add Proscar D/c HCTZ

## 2013-05-05 NOTE — Assessment & Plan Note (Signed)
See Rx changes

## 2013-05-05 NOTE — Progress Notes (Signed)
   Subjective:   HPI    The patient presents for a follow-up of  chronic hypertension, chronic dyslipidemia, type 2 diabetes, CAD controlled with medicines. F/u dry mouth - chronic. F/u urinary urgency incontinence, BPH and elev PSA. F/u poor balance    Review of Systems  Constitutional: Negative for appetite change, fatigue and unexpected weight change.  HENT: Negative for congestion, nosebleeds, sneezing, sore throat and trouble swallowing.   Eyes: Positive for visual disturbance (blind). Negative for itching.  Respiratory: Negative for cough.   Cardiovascular: Negative for chest pain, palpitations and leg swelling.  Gastrointestinal: Negative for nausea, diarrhea, blood in stool and abdominal distention.  Genitourinary: Positive for urgency, frequency and decreased urine volume. Negative for hematuria.  Musculoskeletal: Negative for back pain, gait problem, joint swelling and neck pain.  Skin: Negative for rash.  Neurological: Negative for dizziness, tremors, speech difficulty and weakness.  Psychiatric/Behavioral: Negative for suicidal ideas, sleep disturbance, dysphoric mood and agitation. The patient is not nervous/anxious.    BP Readings from Last 3 Encounters:  05/05/13 130/70  01/02/13 120/70  10/03/12 140/72   Wt Readings from Last 3 Encounters:  05/05/13 207 lb (93.895 kg)  01/02/13 207 lb (93.895 kg)  10/03/12 206 lb (93.441 kg)        Objective:   Physical Exam  Constitutional: He is oriented to person, place, and time. He appears well-developed.  HENT:  Mouth/Throat: Oropharynx is clear and moist.  Eyes: Conjunctivae are normal. Right eye exhibits no discharge. Left eye exhibits no discharge.  Blind   Neck: Normal range of motion. No JVD present. No thyromegaly present.  Cardiovascular: Normal rate, regular rhythm, normal heart sounds and intact distal pulses.  Exam reveals no gallop and no friction rub.   No murmur heard. Pulmonary/Chest: Effort normal  and breath sounds normal. No respiratory distress. He has no wheezes. He has no rales. He exhibits no tenderness.  Abdominal: Soft. Bowel sounds are normal. He exhibits no distension and no mass. There is no tenderness. There is no rebound and no guarding.  Genitourinary: Rectum normal. Guaiac negative stool.  Musculoskeletal: Normal range of motion. He exhibits no edema and no tenderness.  Lymphadenopathy:    He has no cervical adenopathy.  Neurological: He is alert and oriented to person, place, and time. He has normal reflexes. No cranial nerve deficit. He exhibits normal muscle tone. Coordination normal.  Skin: Skin is warm and dry. No rash noted.  Psychiatric: He has a normal mood and affect. His behavior is normal. Judgment and thought content normal.    Lab Results  Component Value Date   WBC 8.0 08/13/2011   HGB 13.4 08/13/2011   HCT 40.5 08/13/2011   PLT 246.0 08/13/2011   GLUCOSE 233* 04/27/2013   CHOL 128 04/27/2013   TRIG 87.0 04/27/2013   HDL 41.60 04/27/2013   LDLCALC 69 04/27/2013   ALT 22 04/27/2013   AST 17 04/27/2013   NA 135 04/27/2013   K 5.1 04/27/2013   CL 99 04/27/2013   CREATININE 2.1* 04/27/2013   BUN 29* 04/27/2013   CO2 29 04/27/2013   TSH 0.80 08/13/2011   PSA 7.46* 12/29/2012   HGBA1C 6.7* 04/27/2013           Assessment & Plan:

## 2013-05-05 NOTE — Progress Notes (Signed)
Pre visit review using our clinic review tool, if applicable. No additional management support is needed unless otherwise documented below in the visit note. 

## 2013-05-19 ENCOUNTER — Telehealth: Payer: Self-pay

## 2013-05-19 NOTE — Telephone Encounter (Signed)
Relevant patient education mailed to patient.  

## 2013-07-28 ENCOUNTER — Other Ambulatory Visit (INDEPENDENT_AMBULATORY_CARE_PROVIDER_SITE_OTHER): Payer: Commercial Managed Care - HMO

## 2013-07-28 DIAGNOSIS — I119 Hypertensive heart disease without heart failure: Secondary | ICD-10-CM

## 2013-07-28 DIAGNOSIS — E1139 Type 2 diabetes mellitus with other diabetic ophthalmic complication: Secondary | ICD-10-CM

## 2013-07-28 LAB — BASIC METABOLIC PANEL
BUN: 24 mg/dL — AB (ref 6–23)
CHLORIDE: 104 meq/L (ref 96–112)
CO2: 25 mEq/L (ref 19–32)
Calcium: 9.4 mg/dL (ref 8.4–10.5)
Creatinine, Ser: 1.9 mg/dL — ABNORMAL HIGH (ref 0.4–1.5)
GFR: 43.74 mL/min — ABNORMAL LOW (ref >60.00)
Glucose, Bld: 95 mg/dL (ref 70–99)
POTASSIUM: 4.3 meq/L (ref 3.5–5.1)
Sodium: 138 mEq/L (ref 135–145)

## 2013-07-28 LAB — HEMOGLOBIN A1C: HEMOGLOBIN A1C: 6.8 % — AB (ref 4.6–6.5)

## 2013-08-04 ENCOUNTER — Ambulatory Visit (INDEPENDENT_AMBULATORY_CARE_PROVIDER_SITE_OTHER): Payer: Commercial Managed Care - HMO | Admitting: Internal Medicine

## 2013-08-04 ENCOUNTER — Encounter: Payer: Self-pay | Admitting: Internal Medicine

## 2013-08-04 VITALS — BP 140/68 | HR 72 | Temp 97.5°F | Resp 16 | Wt 210.0 lb

## 2013-08-04 DIAGNOSIS — E785 Hyperlipidemia, unspecified: Secondary | ICD-10-CM

## 2013-08-04 DIAGNOSIS — I119 Hypertensive heart disease without heart failure: Secondary | ICD-10-CM

## 2013-08-04 DIAGNOSIS — E1139 Type 2 diabetes mellitus with other diabetic ophthalmic complication: Secondary | ICD-10-CM

## 2013-08-04 DIAGNOSIS — R972 Elevated prostate specific antigen [PSA]: Secondary | ICD-10-CM

## 2013-08-04 DIAGNOSIS — M25579 Pain in unspecified ankle and joints of unspecified foot: Secondary | ICD-10-CM

## 2013-08-04 DIAGNOSIS — E11349 Type 2 diabetes mellitus with severe nonproliferative diabetic retinopathy without macular edema: Secondary | ICD-10-CM

## 2013-08-04 NOTE — Progress Notes (Signed)
Pre visit review using our clinic review tool, if applicable. No additional management support is needed unless otherwise documented below in the visit note. 

## 2013-08-04 NOTE — Progress Notes (Signed)
   Subjective:   HPI    The patient presents for a follow-up of  chronic hypertension, chronic dyslipidemia, type 2 diabetes, CAD controlled with medicines. F/u dry mouth - chronic. F/u urinary urgency incontinence, BPH and elev PSA.  F/u poor balance - cane  C/o both feet hurt on top - weeks   Review of Systems  Constitutional: Negative for appetite change, fatigue and unexpected weight change.  HENT: Negative for congestion, nosebleeds, sneezing, sore throat and trouble swallowing.   Eyes: Positive for visual disturbance (blind). Negative for itching.  Respiratory: Negative for cough.   Cardiovascular: Negative for chest pain, palpitations and leg swelling.  Gastrointestinal: Negative for nausea, diarrhea, blood in stool and abdominal distention.  Genitourinary: Positive for urgency, frequency and decreased urine volume. Negative for hematuria.  Musculoskeletal: Negative for back pain, gait problem, joint swelling and neck pain.  Skin: Negative for rash.  Neurological: Negative for dizziness, tremors, speech difficulty and weakness.  Psychiatric/Behavioral: Negative for suicidal ideas, sleep disturbance, dysphoric mood and agitation. The patient is not nervous/anxious.    BP Readings from Last 3 Encounters:  08/04/13 140/68  05/05/13 130/70  01/02/13 120/70   Wt Readings from Last 3 Encounters:  08/04/13 210 lb (95.255 kg)  05/05/13 207 lb (93.895 kg)  01/02/13 207 lb (93.895 kg)        Objective:   Physical Exam  Constitutional: He is oriented to person, place, and time. He appears well-developed.  HENT:  Mouth/Throat: Oropharynx is clear and moist.  Eyes: Conjunctivae are normal. Right eye exhibits no discharge. Left eye exhibits no discharge.  Blind   Neck: Normal range of motion. No JVD present. No thyromegaly present.  Cardiovascular: Normal rate, regular rhythm, normal heart sounds and intact distal pulses.  Exam reveals no gallop and no friction rub.   No  murmur heard. Pulmonary/Chest: Effort normal and breath sounds normal. No respiratory distress. He has no wheezes. He has no rales. He exhibits no tenderness.  Abdominal: Soft. Bowel sounds are normal. He exhibits no distension and no mass. There is no tenderness. There is no rebound and no guarding.  Genitourinary: Rectum normal. Guaiac negative stool.  Musculoskeletal: Normal range of motion. He exhibits no edema and no tenderness.  Lymphadenopathy:    He has no cervical adenopathy.  Neurological: He is alert and oriented to person, place, and time. He has normal reflexes. No cranial nerve deficit. He exhibits normal muscle tone. Coordination normal.  Skin: Skin is warm and dry. No rash noted.  Psychiatric: He has a normal mood and affect. His behavior is normal. Judgment and thought content normal.    Lab Results  Component Value Date   WBC 8.0 08/13/2011   HGB 13.4 08/13/2011   HCT 40.5 08/13/2011   PLT 246.0 08/13/2011   GLUCOSE 95 07/28/2013   CHOL 128 04/27/2013   TRIG 87.0 04/27/2013   HDL 41.60 04/27/2013   LDLCALC 69 04/27/2013   ALT 22 04/27/2013   AST 17 04/27/2013   NA 138 07/28/2013   K 4.3 07/28/2013   CL 104 07/28/2013   CREATININE 1.9* 07/28/2013   BUN 24* 07/28/2013   CO2 25 07/28/2013   TSH 0.80 08/13/2011   PSA 7.46* 12/29/2012   HGBA1C 6.8* 07/28/2013           Assessment & Plan:

## 2013-08-04 NOTE — Assessment & Plan Note (Signed)
Continue with current prescription therapy as reflected on the Med list.  

## 2013-08-04 NOTE — Assessment & Plan Note (Signed)
Watching  

## 2013-10-02 ENCOUNTER — Telehealth: Payer: Self-pay

## 2013-10-02 NOTE — Telephone Encounter (Signed)
LVM for pt to call back.   RE: Scheduling nurse visit for bp recheck.

## 2013-10-05 ENCOUNTER — Ambulatory Visit (INDEPENDENT_AMBULATORY_CARE_PROVIDER_SITE_OTHER): Payer: Commercial Managed Care - HMO | Admitting: *Deleted

## 2013-10-05 ENCOUNTER — Encounter: Payer: Self-pay | Admitting: *Deleted

## 2013-10-05 VITALS — BP 130/72

## 2013-10-05 DIAGNOSIS — I119 Hypertensive heart disease without heart failure: Secondary | ICD-10-CM

## 2013-10-26 ENCOUNTER — Telehealth: Payer: Self-pay | Admitting: Internal Medicine

## 2013-10-26 DIAGNOSIS — I25810 Atherosclerosis of coronary artery bypass graft(s) without angina pectoris: Secondary | ICD-10-CM

## 2013-10-26 NOTE — Telephone Encounter (Signed)
[facility] called an said that pt is out of referrals and needs new referrals put in  His cardio Dr.

## 2013-10-26 NOTE — Telephone Encounter (Signed)
Ok Thx 

## 2013-10-27 ENCOUNTER — Ambulatory Visit (INDEPENDENT_AMBULATORY_CARE_PROVIDER_SITE_OTHER): Payer: Commercial Managed Care - HMO | Admitting: Internal Medicine

## 2013-10-27 ENCOUNTER — Encounter: Payer: Self-pay | Admitting: Internal Medicine

## 2013-10-27 VITALS — BP 110/50 | HR 84 | Resp 14 | Wt 208.4 lb

## 2013-10-27 DIAGNOSIS — B9789 Other viral agents as the cause of diseases classified elsewhere: Secondary | ICD-10-CM

## 2013-10-27 DIAGNOSIS — J988 Other specified respiratory disorders: Secondary | ICD-10-CM

## 2013-10-27 DIAGNOSIS — M25512 Pain in left shoulder: Secondary | ICD-10-CM

## 2013-10-27 DIAGNOSIS — B349 Viral infection, unspecified: Secondary | ICD-10-CM
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
## 2013-10-27 NOTE — Patient Instructions (Addendum)
Use an anti-inflammatory cream such as Aspercreme or Zostrix cream twice a day to the affected area as needed. In lieu of this warm moist compresses or  hot water bottle can be used. Do not apply ice . Take the pain medicines as prescribed on an as-needed basis for the shoulder pain. Zicam Melts or Zinc lozenges as per package label for sore throat . Complementary options include  vitamin C 2000 mg daily; & Echinacea for 4-7 days. Report persistent or progressive fever; discolored nasal or chest secretions; or frontal headache or facial  pain.  Robitussin-DM should be effective for cough control; Robitussin D should be avoided as it can raise blood pressure and cause cardiac irregularities. In men it can also cause prostatic dysfunction.  Caries ( cavities) and plaque on the teeth can lead to significant local and systemic infections with threat to your health.Please have dental care completed as soon as possible.

## 2013-10-27 NOTE — Progress Notes (Signed)
Pre visit review using our clinic review tool, if applicable. No additional management support is needed unless otherwise documented below in the visit note. 

## 2013-11-06 ENCOUNTER — Encounter: Payer: Self-pay | Admitting: Internal Medicine

## 2013-11-06 ENCOUNTER — Ambulatory Visit (INDEPENDENT_AMBULATORY_CARE_PROVIDER_SITE_OTHER): Payer: Commercial Managed Care - HMO | Admitting: Internal Medicine

## 2013-11-06 ENCOUNTER — Other Ambulatory Visit (INDEPENDENT_AMBULATORY_CARE_PROVIDER_SITE_OTHER): Payer: Commercial Managed Care - HMO

## 2013-11-06 VITALS — BP 130/72 | HR 83 | Temp 97.8°F | Resp 16 | Ht 66.0 in | Wt 206.0 lb

## 2013-11-06 DIAGNOSIS — Z23 Encounter for immunization: Secondary | ICD-10-CM

## 2013-11-06 DIAGNOSIS — E11349 Type 2 diabetes mellitus with severe nonproliferative diabetic retinopathy without macular edema: Secondary | ICD-10-CM

## 2013-11-06 DIAGNOSIS — M25579 Pain in unspecified ankle and joints of unspecified foot: Secondary | ICD-10-CM

## 2013-11-06 DIAGNOSIS — I2510 Atherosclerotic heart disease of native coronary artery without angina pectoris: Secondary | ICD-10-CM

## 2013-11-06 DIAGNOSIS — R972 Elevated prostate specific antigen [PSA]: Secondary | ICD-10-CM

## 2013-11-06 DIAGNOSIS — I119 Hypertensive heart disease without heart failure: Secondary | ICD-10-CM

## 2013-11-06 DIAGNOSIS — E785 Hyperlipidemia, unspecified: Secondary | ICD-10-CM

## 2013-11-06 LAB — BASIC METABOLIC PANEL
BUN: 18 mg/dL (ref 6–23)
CO2: 26 meq/L (ref 19–32)
CREATININE: 2 mg/dL — AB (ref 0.4–1.5)
Calcium: 9.9 mg/dL (ref 8.4–10.5)
Chloride: 106 mEq/L (ref 96–112)
GFR: 42.94 mL/min — AB (ref >60.00)
GLUCOSE: 99 mg/dL (ref 70–99)
Potassium: 5.4 mEq/L — ABNORMAL HIGH (ref 3.5–5.1)
Sodium: 140 mEq/L (ref 135–145)

## 2013-11-06 LAB — HEMOGLOBIN A1C: Hgb A1c MFr Bld: 7 % — ABNORMAL HIGH (ref 4.6–6.5)

## 2013-11-06 LAB — URIC ACID: Uric Acid, Serum: 7.5 mg/dL (ref 4.0–7.8)

## 2013-11-06 LAB — PSA: PSA: 9.36 ng/mL — ABNORMAL HIGH (ref 0.10–4.00)

## 2013-11-06 NOTE — Progress Notes (Signed)
   Subjective:   HPI    The patient presents for a follow-up of  chronic hypertension, chronic dyslipidemia, type 2 diabetes, CAD controlled with medicines. F/u dry mouth - chronic. F/u urinary urgency incontinence, BPH and elev PSA.  F/u poor balance - cane  F/u both feet hurt on top L>R - weeks   Review of Systems  Constitutional: Negative for appetite change, fatigue and unexpected weight change.  HENT: Negative for congestion, nosebleeds, sneezing, sore throat and trouble swallowing.   Eyes: Positive for visual disturbance (blind). Negative for itching.  Respiratory: Negative for cough.   Cardiovascular: Negative for chest pain, palpitations and leg swelling.  Gastrointestinal: Negative for nausea, diarrhea, blood in stool and abdominal distention.  Genitourinary: Positive for urgency, frequency and decreased urine volume. Negative for hematuria.  Musculoskeletal: Negative for back pain, gait problem, joint swelling and neck pain.  Skin: Negative for rash.  Neurological: Negative for dizziness, tremors, speech difficulty and weakness.  Psychiatric/Behavioral: Negative for suicidal ideas, sleep disturbance, dysphoric mood and agitation. The patient is not nervous/anxious.    BP Readings from Last 3 Encounters:  11/06/13 130/72  10/27/13 110/50  10/05/13 130/72   Wt Readings from Last 3 Encounters:  11/06/13 206 lb (93.441 kg)  10/27/13 208 lb 6 oz (94.518 kg)  08/04/13 210 lb (95.255 kg)        Objective:   Physical Exam  Constitutional: He is oriented to person, place, and time. He appears well-developed.  HENT:  Mouth/Throat: Oropharynx is clear and moist.  Eyes: Conjunctivae are normal. Right eye exhibits no discharge. Left eye exhibits no discharge.  Blind   Neck: Normal range of motion. No JVD present. No thyromegaly present.  Cardiovascular: Normal rate, regular rhythm, normal heart sounds and intact distal pulses.  Exam reveals no gallop and no friction rub.    No murmur heard. Pulmonary/Chest: Effort normal and breath sounds normal. No respiratory distress. He has no wheezes. He has no rales. He exhibits no tenderness.  Abdominal: Soft. Bowel sounds are normal. He exhibits no distension and no mass. There is no tenderness. There is no rebound and no guarding.  Genitourinary: Rectum normal. Guaiac negative stool.  Musculoskeletal: Normal range of motion. He exhibits no edema and no tenderness.  Lymphadenopathy:    He has no cervical adenopathy.  Neurological: He is alert and oriented to person, place, and time. He has normal reflexes. No cranial nerve deficit. He exhibits normal muscle tone. Coordination normal.  Skin: Skin is warm and dry. No rash noted.  Psychiatric: He has a normal mood and affect. His behavior is normal. Judgment and thought content normal.    Lab Results  Component Value Date   WBC 8.0 08/13/2011   HGB 13.4 08/13/2011   HCT 40.5 08/13/2011   PLT 246.0 08/13/2011   GLUCOSE 95 07/28/2013   CHOL 128 04/27/2013   TRIG 87.0 04/27/2013   HDL 41.60 04/27/2013   LDLCALC 69 04/27/2013   ALT 22 04/27/2013   AST 17 04/27/2013   NA 138 07/28/2013   K 4.3 07/28/2013   CL 104 07/28/2013   CREATININE 1.9* 07/28/2013   BUN 24* 07/28/2013   CO2 25 07/28/2013   TSH 0.80 08/13/2011   PSA 7.46* 12/29/2012   HGBA1C 6.8* 07/28/2013           Assessment & Plan:

## 2013-11-06 NOTE — Assessment & Plan Note (Signed)
Continue with current prescription therapy as reflected on the Med list.  

## 2013-11-06 NOTE — Progress Notes (Signed)
Pre visit review using our clinic review tool, if applicable. No additional management support is needed unless otherwise documented below in the visit note. 

## 2013-11-06 NOTE — Assessment & Plan Note (Signed)
04/26/00 Cath  normal Left main, 70% stenosis mid LAD, 60% stenosis proximal CFX, 90% stenosis distal CFX, 50% stenosis mid RCA, 50% stenosis distal RCA  CABG  LIMA to LAD, SVG to int, SVG to OM, SVG to PD-PL 04/29/2000 Dr. Cyndia Bent  Continue with current prescription therapy as reflected on the Med list.

## 2013-11-20 ENCOUNTER — Other Ambulatory Visit: Payer: Self-pay | Admitting: Internal Medicine

## 2013-11-20 DIAGNOSIS — R972 Elevated prostate specific antigen [PSA]: Secondary | ICD-10-CM

## 2014-01-12 ENCOUNTER — Ambulatory Visit: Payer: Commercial Managed Care - HMO | Admitting: Internal Medicine

## 2014-01-13 ENCOUNTER — Telehealth: Payer: Self-pay | Admitting: Internal Medicine

## 2014-01-13 MED ORDER — METFORMIN HCL 1000 MG PO TABS: 1000.0000 mg | ORAL_TABLET | Freq: Two times a day (BID) | ORAL | Status: DC

## 2014-01-13 MED ORDER — GLUCOSE BLOOD VI STRP: 1.0000 | ORAL_STRIP | Freq: Two times a day (BID) | Status: DC

## 2014-01-13 MED ORDER — PRODIGY LANCETS 28G MISC: 28.0000 g | Freq: Two times a day (BID) | Status: DC

## 2014-01-13 MED ORDER — GLIMEPIRIDE 2 MG PO TABS: 2.0000 mg | ORAL_TABLET | Freq: Two times a day (BID) | ORAL | Status: DC

## 2014-01-13 NOTE — Telephone Encounter (Signed)
Pt requesting all diabetic medications be sent to [facility], as he gets mail order.

## 2014-01-13 NOTE — Telephone Encounter (Signed)
Notified pt meds sent to [facility] [address]...Caleb Dunn

## 2014-01-20 ENCOUNTER — Encounter: Payer: Self-pay | Admitting: Internal Medicine

## 2014-01-20 ENCOUNTER — Ambulatory Visit (INDEPENDENT_AMBULATORY_CARE_PROVIDER_SITE_OTHER): Payer: Commercial Managed Care - HMO | Admitting: Internal Medicine

## 2014-01-20 VITALS — BP 132/82 | HR 91 | Temp 97.5°F | Ht 66.0 in | Wt 208.0 lb

## 2014-01-20 DIAGNOSIS — I2510 Atherosclerotic heart disease of native coronary artery without angina pectoris: Secondary | ICD-10-CM

## 2014-01-20 DIAGNOSIS — E1139 Type 2 diabetes mellitus with other diabetic ophthalmic complication: Secondary | ICD-10-CM

## 2014-01-20 DIAGNOSIS — R972 Elevated prostate specific antigen [PSA]: Secondary | ICD-10-CM

## 2014-01-20 DIAGNOSIS — E785 Hyperlipidemia, unspecified: Secondary | ICD-10-CM

## 2014-01-20 DIAGNOSIS — E669 Obesity, unspecified: Secondary | ICD-10-CM

## 2014-01-20 NOTE — Assessment & Plan Note (Signed)
Wt Readings from Last 3 Encounters:  01/20/14 208 lb (94.348 kg)  11/06/13 206 lb (93.441 kg)  10/27/13 208 lb 6 oz (94.518 kg)

## 2014-01-20 NOTE — Assessment & Plan Note (Signed)
Monitoring PSA 

## 2014-01-20 NOTE — Assessment & Plan Note (Signed)
Continue with current prescription therapy as reflected on the Med list.  

## 2014-01-20 NOTE — Progress Notes (Signed)
   Subjective:   HPI    The patient presents for a follow-up of  chronic hypertension, chronic dyslipidemia, type 2 diabetes, CAD controlled with medicines. F/u dry mouth - chronic. F/u urinary urgency incontinence, BPH and elev PSA.  F/u poor balance - cane  F/u both feet hurt on top L>R - weeks   Review of Systems  Constitutional: Negative for appetite change, fatigue and unexpected weight change.  HENT: Negative for congestion, nosebleeds, sneezing, sore throat and trouble swallowing.   Eyes: Positive for visual disturbance (blind). Negative for itching.  Respiratory: Negative for cough.   Cardiovascular: Negative for chest pain, palpitations and leg swelling.  Gastrointestinal: Negative for nausea, diarrhea, blood in stool and abdominal distention.  Genitourinary: Positive for urgency, frequency and decreased urine volume. Negative for hematuria.  Musculoskeletal: Negative for back pain, joint swelling, gait problem and neck pain.  Skin: Negative for rash.  Neurological: Negative for dizziness, tremors, speech difficulty and weakness.  Psychiatric/Behavioral: Negative for suicidal ideas, sleep disturbance, dysphoric mood and agitation. The patient is not nervous/anxious.    BP Readings from Last 3 Encounters:  01/20/14 132/82  11/06/13 130/72  10/27/13 110/50   Wt Readings from Last 3 Encounters:  01/20/14 208 lb (94.348 kg)  11/06/13 206 lb (93.441 kg)  10/27/13 208 lb 6 oz (94.518 kg)        Objective:   Physical Exam  Constitutional: He is oriented to person, place, and time. He appears well-developed. No distress.  Obese NAD  HENT:  Mouth/Throat: Oropharynx is clear and moist.  Eyes: Conjunctivae are normal. Pupils are equal, round, and reactive to light.  Neck: Normal range of motion. No JVD present. No thyromegaly present.  Cardiovascular: Normal rate, regular rhythm, normal heart sounds and intact distal pulses.  Exam reveals no gallop and no friction rub.    No murmur heard. Pulmonary/Chest: Effort normal and breath sounds normal. No respiratory distress. He has no wheezes. He has no rales. He exhibits no tenderness.  Abdominal: Soft. Bowel sounds are normal. He exhibits no distension and no mass. There is no tenderness. There is no rebound and no guarding.  Musculoskeletal: Normal range of motion. He exhibits no edema or tenderness.  Lymphadenopathy:    He has no cervical adenopathy.  Neurological: He is alert and oriented to person, place, and time. He has normal reflexes. No cranial nerve deficit. He exhibits normal muscle tone. He displays a negative Romberg sign. Coordination and gait normal.  No meningeal signs  Skin: Skin is warm and dry. No rash noted.  Psychiatric: He has a normal mood and affect. His behavior is normal. Judgment and thought content normal.  Blind  Lab Results  Component Value Date   WBC 8.0 08/13/2011   HGB 13.4 08/13/2011   HCT 40.5 08/13/2011   PLT 246.0 08/13/2011   GLUCOSE 99 11/06/2013   CHOL 128 04/27/2013   TRIG 87.0 04/27/2013   HDL 41.60 04/27/2013   LDLCALC 69 04/27/2013   ALT 22 04/27/2013   AST 17 04/27/2013   NA 140 11/06/2013   K 5.4* 11/06/2013   CL 106 11/06/2013   CREATININE 2.0* 11/06/2013   BUN 18 11/06/2013   CO2 26 11/06/2013   TSH 0.80 08/13/2011   PSA 9.36* 11/06/2013   HGBA1C 7.0* 11/06/2013           Assessment & Plan:

## 2014-02-23 ENCOUNTER — Ambulatory Visit (INDEPENDENT_AMBULATORY_CARE_PROVIDER_SITE_OTHER): Payer: Commercial Managed Care - HMO | Admitting: Internal Medicine

## 2014-02-23 ENCOUNTER — Encounter: Payer: Self-pay | Admitting: Internal Medicine

## 2014-02-23 VITALS — BP 152/74 | HR 78 | Temp 97.4°F | Wt 205.0 lb

## 2014-02-23 DIAGNOSIS — N451 Epididymitis: Secondary | ICD-10-CM | POA: Diagnosis not present

## 2014-02-23 DIAGNOSIS — I2510 Atherosclerotic heart disease of native coronary artery without angina pectoris: Secondary | ICD-10-CM

## 2014-02-23 DIAGNOSIS — E1139 Type 2 diabetes mellitus with other diabetic ophthalmic complication: Secondary | ICD-10-CM

## 2014-02-23 MED ORDER — CIPROFLOXACIN HCL 500 MG PO TABS: 500.0000 mg | ORAL_TABLET | Freq: Every day | ORAL | Status: DC

## 2014-02-23 NOTE — Assessment & Plan Note (Addendum)
Blind

## 2014-02-23 NOTE — Progress Notes (Signed)
Pre visit review using our clinic review tool, if applicable. No additional management support is needed unless otherwise documented below in the visit note. 

## 2014-02-23 NOTE — Assessment & Plan Note (Signed)
Cipro x 10 d 

## 2014-02-23 NOTE — Assessment & Plan Note (Signed)
Continue with current prescription therapy as reflected on the Med list.  

## 2014-02-23 NOTE — Progress Notes (Signed)
   Subjective:   HPI  C/o L groin pain in the L testis - worse w/turning in bed, crossing legs. Sitting and walking - no pain Hips ok C/o difficulties living at home alone...     Review of Systems  Constitutional: Negative for appetite change, fatigue and unexpected weight change.  HENT: Negative for congestion, nosebleeds, sneezing, sore throat and trouble swallowing.   Eyes: Positive for visual disturbance (blind). Negative for itching.  Respiratory: Negative for cough.   Cardiovascular: Negative for chest pain, palpitations and leg swelling.  Gastrointestinal: Negative for nausea, diarrhea, blood in stool and abdominal distention.  Genitourinary: Positive for urgency, frequency and decreased urine volume. Negative for hematuria.  Musculoskeletal: Negative for back pain, joint swelling, gait problem and neck pain.  Skin: Negative for rash.  Neurological: Negative for dizziness, tremors, speech difficulty and weakness.  Psychiatric/Behavioral: Negative for suicidal ideas, sleep disturbance, dysphoric mood and agitation. The patient is not nervous/anxious.    BP Readings from Last 3 Encounters:  02/23/14 152/74  01/20/14 132/82  11/06/13 130/72   Wt Readings from Last 3 Encounters:  02/23/14 205 lb (92.987 kg)  01/20/14 208 lb (94.348 kg)  11/06/13 206 lb (93.441 kg)        Objective:   Physical Exam  Constitutional: He is oriented to person, place, and time. He appears well-developed. No distress.  Obese NAD  HENT:  Mouth/Throat: Oropharynx is clear and moist.  Eyes: Conjunctivae are normal. Pupils are equal, round, and reactive to light.  Neck: Normal range of motion. No JVD present. No thyromegaly present.  Cardiovascular: Normal rate, regular rhythm, normal heart sounds and intact distal pulses.  Exam reveals no gallop and no friction rub.   No murmur heard. Pulmonary/Chest: Effort normal and breath sounds normal. No respiratory distress. He has no wheezes. He has  no rales. He exhibits no tenderness.  Abdominal: Soft. Bowel sounds are normal. He exhibits no distension and no mass. There is no tenderness. There is no rebound and no guarding.  Musculoskeletal: Normal range of motion. He exhibits no edema or tenderness.  Lymphadenopathy:    He has no cervical adenopathy.  Neurological: He is alert and oriented to person, place, and time. He has normal reflexes. No cranial nerve deficit. He exhibits normal muscle tone. He displays a negative Romberg sign. Coordination and gait normal.  No meningeal signs  Skin: Skin is warm and dry. No rash noted.  Psychiatric: He has a normal mood and affect. His behavior is normal. Judgment and thought content normal.  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, L epidydimis is tender No hernia L hip NT Using a cane  Lab Results  Component Value Date   WBC 8.0 08/13/2011   HGB 13.4 08/13/2011   HCT 40.5 08/13/2011   PLT 246.0 08/13/2011   GLUCOSE 99 11/06/2013   CHOL 128 04/27/2013   TRIG 87.0 04/27/2013   HDL 41.60 04/27/2013   LDLCALC 69 04/27/2013   ALT 22 04/27/2013   AST 17 04/27/2013   NA 140 11/06/2013   K 5.4* 11/06/2013   CL 106 11/06/2013   CREATININE 2.0* 11/06/2013   BUN 18 11/06/2013   CO2 26 11/06/2013   TSH 0.80 08/13/2011   PSA 9.36* 11/06/2013   HGBA1C 7.0* 11/06/2013      Assessment & Plan:  Patient ID: Caleb Dunn, male   DOB: [birth year], 74 y.o.   MRN: [id number]

## 2014-03-03 ENCOUNTER — Telehealth: Payer: Self-pay | Admitting: Internal Medicine

## 2014-03-03 NOTE — Telephone Encounter (Signed)
Patient states he picked up a script from [facility] for a med for his prostate and then he received the same script from [facility].  He is requesting a call in regards.

## 2014-03-08 NOTE — Telephone Encounter (Signed)
Called pt- spoke to a male who states he is not home. Will try again later.

## 2014-03-08 NOTE — Telephone Encounter (Signed)
Patient is at home today.  Please call.

## 2014-03-08 NOTE — Telephone Encounter (Signed)
Pt states he received Cipro from [place] [facility] [place]. He states he has finished the one he picked up from [place]. His testicle and groin pains are better. I advised him to hang on to the cipro he received from [facility].

## 2014-04-06 ENCOUNTER — Telehealth: Payer: Self-pay | Admitting: Internal Medicine

## 2014-04-10 ENCOUNTER — Other Ambulatory Visit: Payer: Self-pay | Admitting: Internal Medicine

## 2014-04-15 ENCOUNTER — Other Ambulatory Visit (INDEPENDENT_AMBULATORY_CARE_PROVIDER_SITE_OTHER): Payer: Commercial Managed Care - HMO

## 2014-04-15 DIAGNOSIS — I119 Hypertensive heart disease without heart failure: Secondary | ICD-10-CM | POA: Diagnosis not present

## 2014-04-15 DIAGNOSIS — E1139 Type 2 diabetes mellitus with other diabetic ophthalmic complication: Secondary | ICD-10-CM | POA: Diagnosis not present

## 2014-04-15 LAB — BASIC METABOLIC PANEL
BUN: 20 mg/dL (ref 6–23)
CO2: 26 mEq/L (ref 19–32)
CREATININE: 1.74 mg/dL — AB (ref 0.40–1.50)
Calcium: 9.5 mg/dL (ref 8.4–10.5)
Chloride: 106 mEq/L (ref 96–112)
GFR: 49.5 mL/min — AB (ref >60.00)
GLUCOSE: 62 mg/dL — AB (ref 70–99)
Potassium: 4.3 mEq/L (ref 3.5–5.1)
SODIUM: 138 meq/L (ref 135–145)

## 2014-04-15 LAB — HEMOGLOBIN A1C: Hgb A1c MFr Bld: 6.5 % (ref 4.6–6.5)

## 2014-04-21 ENCOUNTER — Encounter: Payer: Self-pay | Admitting: Internal Medicine

## 2014-04-21 ENCOUNTER — Ambulatory Visit (INDEPENDENT_AMBULATORY_CARE_PROVIDER_SITE_OTHER): Payer: Commercial Managed Care - HMO | Admitting: Internal Medicine

## 2014-04-21 VITALS — BP 130/76 | HR 86 | Wt 202.0 lb

## 2014-04-21 DIAGNOSIS — K219 Gastro-esophageal reflux disease without esophagitis: Secondary | ICD-10-CM | POA: Diagnosis not present

## 2014-04-21 DIAGNOSIS — I119 Hypertensive heart disease without heart failure: Secondary | ICD-10-CM | POA: Diagnosis not present

## 2014-04-21 DIAGNOSIS — N184 Chronic kidney disease, stage 4 (severe): Secondary | ICD-10-CM

## 2014-04-21 DIAGNOSIS — E11349 Type 2 diabetes mellitus with severe nonproliferative diabetic retinopathy without macular edema: Secondary | ICD-10-CM

## 2014-04-21 DIAGNOSIS — E785 Hyperlipidemia, unspecified: Secondary | ICD-10-CM

## 2014-04-21 MED ORDER — ASPIRIN 81 MG PO TABS: 81.0000 mg | ORAL_TABLET | Freq: Every day | ORAL | Status: DC

## 2014-04-21 MED ORDER — TAMSULOSIN HCL 0.4 MG PO CAPS: 0.4000 mg | ORAL_CAPSULE | Freq: Every day | ORAL | Status: DC

## 2014-04-21 MED ORDER — METFORMIN HCL 500 MG PO TABS: 500.0000 mg | ORAL_TABLET | Freq: Every day | ORAL | Status: DC

## 2014-04-21 MED ORDER — VITAMIN D3 25 MCG (1000 UNIT) PO TABS: 1000.0000 [IU] | ORAL_TABLET | Freq: Every day | ORAL | Status: DC

## 2014-04-21 MED ORDER — METOPROLOL SUCCINATE ER 50 MG PO TB24: 50.0000 mg | ORAL_TABLET | Freq: Every day | ORAL | Status: DC

## 2014-04-21 MED ORDER — VERAPAMIL HCL ER 180 MG PO CP24: 180.0000 mg | ORAL_CAPSULE | Freq: Every day | ORAL | Status: DC

## 2014-04-21 MED ORDER — LOVASTATIN 20 MG PO TABS: 20.0000 mg | ORAL_TABLET | Freq: Every day | ORAL | Status: DC

## 2014-04-21 MED ORDER — METFORMIN HCL 1000 MG PO TABS: 1000.0000 mg | ORAL_TABLET | Freq: Every day | ORAL | Status: DC

## 2014-04-21 MED ORDER — FINASTERIDE 5 MG PO TABS: 5.0000 mg | ORAL_TABLET | Freq: Every day | ORAL | Status: DC

## 2014-04-21 MED ORDER — LOSARTAN POTASSIUM 50 MG PO TABS: 50.0000 mg | ORAL_TABLET | Freq: Every day | ORAL | Status: DC

## 2014-04-21 MED ORDER — GLIMEPIRIDE 2 MG PO TABS: 2.0000 mg | ORAL_TABLET | Freq: Two times a day (BID) | ORAL | Status: DC

## 2014-04-21 MED ORDER — OMEPRAZOLE 20 MG PO CPDR: 40.0000 mg | DELAYED_RELEASE_CAPSULE | Freq: Every day | ORAL | Status: DC

## 2014-04-21 MED ORDER — METFORMIN HCL 1000 MG PO TABS: ORAL_TABLET | ORAL | Status: DC

## 2014-04-21 NOTE — Progress Notes (Signed)
Pre visit review using our clinic review tool, if applicable. No additional management support is needed unless otherwise documented below in the visit note. 

## 2014-04-21 NOTE — Assessment & Plan Note (Signed)
Chronic  Toprol, Verapamil

## 2014-04-21 NOTE — Assessment & Plan Note (Signed)
Chronic, blind On Glimeperide

## 2014-04-21 NOTE — Assessment & Plan Note (Signed)
Chronic  On Omeprazole

## 2014-04-21 NOTE — Assessment & Plan Note (Signed)
Will reduce Metformin dose

## 2014-04-21 NOTE — Progress Notes (Signed)
   Subjective:   HPI  Pt wants his Rx's to be transferred to [facility]  C/o urinary urgency, incontinence  The patient presents for a follow-up of  chronic hypertension, chronic dyslipidemia, type 2 diabetes, CAD controlled with medicines. F/u dry mouth - chronic. F/u urinary urgency incontinence, BPH and elev PSA.  F/u poor balance - cane  F/u both feet hurt on top L>R - chronic   Review of Systems  Constitutional: Negative for appetite change, fatigue and unexpected weight change.  HENT: Negative for congestion, nosebleeds, sneezing, sore throat and trouble swallowing.   Eyes: Positive for visual disturbance (blind). Negative for itching.  Respiratory: Negative for cough.   Cardiovascular: Negative for chest pain, palpitations and leg swelling.  Gastrointestinal: Negative for nausea, diarrhea, blood in stool and abdominal distention.  Genitourinary: Positive for urgency, frequency and decreased urine volume. Negative for hematuria.  Musculoskeletal: Negative for back pain, joint swelling, gait problem and neck pain.  Skin: Negative for rash.  Neurological: Negative for dizziness, tremors, speech difficulty and weakness.  Psychiatric/Behavioral: Negative for suicidal ideas, sleep disturbance, dysphoric mood and agitation. The patient is not nervous/anxious.    BP Readings from Last 3 Encounters:  04/21/14 130/76  02/23/14 152/74  01/20/14 132/82   Wt Readings from Last 3 Encounters:  04/21/14 202 lb (91.627 kg)  02/23/14 205 lb (92.987 kg)  01/20/14 208 lb (94.348 kg)        Objective:   Physical Exam  Constitutional: He is oriented to person, place, and time. He appears well-developed. No distress.  Obese NAD  HENT:  Mouth/Throat: Oropharynx is clear and moist.  Eyes: Conjunctivae are normal. Pupils are equal, round, and reactive to light.  Neck: Normal range of motion. No JVD present. No thyromegaly present.  Cardiovascular: Normal rate, regular rhythm, normal heart  sounds and intact distal pulses.  Exam reveals no gallop and no friction rub.   No murmur heard. Pulmonary/Chest: Effort normal and breath sounds normal. No respiratory distress. He has no wheezes. He has no rales. He exhibits no tenderness.  Abdominal: Soft. Bowel sounds are normal. He exhibits no distension and no mass. There is no tenderness. There is no rebound and no guarding.  Musculoskeletal: Normal range of motion. He exhibits no edema or tenderness.  Lymphadenopathy:    He has no cervical adenopathy.  Neurological: He is alert and oriented to person, place, and time. He has normal reflexes. No cranial nerve deficit. He exhibits normal muscle tone. He displays a negative Romberg sign. Coordination and gait normal.  No meningeal signs  Skin: Skin is warm and dry. No rash noted.  Psychiatric: He has a normal mood and affect. His behavior is normal. Judgment and thought content normal.  Blind  Lab Results  Component Value Date   WBC 8.0 08/13/2011   HGB 13.4 08/13/2011   HCT 40.5 08/13/2011   PLT 246.0 08/13/2011   GLUCOSE 62* 04/15/2014   CHOL 128 04/27/2013   TRIG 87.0 04/27/2013   HDL 41.60 04/27/2013   LDLCALC 69 04/27/2013   ALT 22 04/27/2013   AST 17 04/27/2013   NA 138 04/15/2014   K 4.3 04/15/2014   CL 106 04/15/2014   CREATININE 1.74* 04/15/2014   BUN 20 04/15/2014   CO2 26 04/15/2014   TSH 0.80 08/13/2011   PSA 9.36* 11/06/2013   HGBA1C 6.5 04/15/2014           Assessment & Plan:

## 2014-04-21 NOTE — Assessment & Plan Note (Signed)
Chronic  On Lovastatin 

## 2014-05-09 ENCOUNTER — Other Ambulatory Visit: Payer: Self-pay | Admitting: Internal Medicine

## 2014-05-10 NOTE — Telephone Encounter (Signed)
Was Losartan d/c at 04/21/14 OV? Ok to Rf?

## 2014-05-14 ENCOUNTER — Encounter: Payer: Self-pay | Admitting: Gastroenterology

## 2014-06-30 ENCOUNTER — Telehealth: Payer: Self-pay | Admitting: Internal Medicine

## 2014-06-30 NOTE — Telephone Encounter (Signed)
Pt called in and is constipated for 3 days and needs suggestion what to do to help her ?     Best number - [phone]

## 2014-06-30 NOTE — Telephone Encounter (Signed)
I called pt- He has used MOM and had a good BM, but has not had a BM in 6 days.  He denies abd pain/cramping. He feels the need to go, but can't. What else should he be doing?

## 2014-07-01 NOTE — Telephone Encounter (Signed)
Notified pt with md response.../lmb 

## 2014-07-01 NOTE — Telephone Encounter (Signed)
Use MOM Thx

## 2014-07-08 ENCOUNTER — Telehealth: Payer: Self-pay | Admitting: Internal Medicine

## 2014-07-08 MED ORDER — VERAPAMIL HCL ER 180 MG PO CP24: 180.0000 mg | ORAL_CAPSULE | Freq: Every day | ORAL | Status: DC

## 2014-07-08 MED ORDER — TAMSULOSIN HCL 0.4 MG PO CAPS: 0.4000 mg | ORAL_CAPSULE | Freq: Every day | ORAL | Status: DC

## 2014-07-08 MED ORDER — FINASTERIDE 5 MG PO TABS: 5.0000 mg | ORAL_TABLET | Freq: Every day | ORAL | Status: DC

## 2014-07-08 MED ORDER — ASPIRIN 81 MG PO TABS
81.0000 mg | ORAL_TABLET | Freq: Every day | ORAL | Status: DC
Start: 2014-07-08 — End: 2016-03-16

## 2014-07-08 MED ORDER — OMEPRAZOLE 20 MG PO CPDR: 40.0000 mg | DELAYED_RELEASE_CAPSULE | Freq: Every day | ORAL | Status: DC

## 2014-07-08 MED ORDER — LOVASTATIN 20 MG PO TABS: 20.0000 mg | ORAL_TABLET | Freq: Every day | ORAL | Status: DC

## 2014-07-08 MED ORDER — METOPROLOL SUCCINATE ER 50 MG PO TB24: 50.0000 mg | ORAL_TABLET | Freq: Every day | ORAL | Status: DC

## 2014-07-08 NOTE — Telephone Encounter (Signed)
Patient is wanting prescriptions lovastatin (MEVACOR) 20 MG tablet [id number], metoprolol succinate (TOPROL-XL) 50 MG 24 hr tablet [id number], finasteride (PROSCAR) 5 MG tablet [[id number]], omeprazole (PRILOSEC) 20 MG capsule [id number], tamsulosin (FLOMAX) 0.4 MG CAPS capsule [id number] and verapamil (VERELAN PM) 180 MG 24 hr capsule [id number] to go to [facility]. [facility] will not fill these because they were sent to [facility].

## 2014-07-08 NOTE — Telephone Encounter (Signed)
I called [facility] and cancelled all Rxs sent on 04/21/14. New Rxs for below sent to [facility] order. See meds. Pt informed

## 2014-07-13 ENCOUNTER — Telehealth: Payer: Self-pay | Admitting: Internal Medicine

## 2014-07-13 ENCOUNTER — Encounter: Payer: Self-pay | Admitting: Internal Medicine

## 2014-07-13 ENCOUNTER — Ambulatory Visit (INDEPENDENT_AMBULATORY_CARE_PROVIDER_SITE_OTHER): Payer: Commercial Managed Care - HMO | Admitting: Internal Medicine

## 2014-07-13 VITALS — BP 150/84 | HR 72 | Wt 201.0 lb

## 2014-07-13 DIAGNOSIS — E785 Hyperlipidemia, unspecified: Secondary | ICD-10-CM | POA: Diagnosis not present

## 2014-07-13 DIAGNOSIS — I25110 Atherosclerotic heart disease of native coronary artery with unstable angina pectoris: Secondary | ICD-10-CM | POA: Diagnosis not present

## 2014-07-13 DIAGNOSIS — B029 Zoster without complications: Secondary | ICD-10-CM

## 2014-07-13 DIAGNOSIS — K5901 Slow transit constipation: Secondary | ICD-10-CM

## 2014-07-13 DIAGNOSIS — K5900 Constipation, unspecified: Secondary | ICD-10-CM | POA: Insufficient documentation

## 2014-07-13 MED ORDER — TRIAMCINOLONE ACETONIDE 0.5 % EX OINT: 1.0000 "application " | TOPICAL_OINTMENT | Freq: Two times a day (BID) | CUTANEOUS | Status: DC

## 2014-07-13 MED ORDER — TRIAMCINOLONE ACETONIDE 0.5 % EX OINT: 1.0000 "application " | TOPICAL_OINTMENT | Freq: Four times a day (QID) | CUTANEOUS | Status: DC

## 2014-07-13 MED ORDER — VALACYCLOVIR HCL 1 G PO TABS: 1000.0000 mg | ORAL_TABLET | Freq: Three times a day (TID) | ORAL | Status: DC

## 2014-07-13 MED ORDER — LINACLOTIDE 290 MCG PO CAPS: 290.0000 ug | ORAL_CAPSULE | Freq: Every day | ORAL | Status: DC

## 2014-07-13 MED ORDER — TRAMADOL HCL 50 MG PO TABS: 50.0000 mg | ORAL_TABLET | Freq: Two times a day (BID) | ORAL | Status: DC | PRN

## 2014-07-13 NOTE — Telephone Encounter (Signed)
Patient stated that he was stung by a bee four days ago and his arm is sore, could you call something into his pharmacy? Please advise [facility] [place] [phone] (Phone) [phone] (Fax)

## 2014-07-13 NOTE — Telephone Encounter (Signed)
Triamc oint qid Thx

## 2014-07-13 NOTE — Progress Notes (Signed)
Subjective:   Rash This is a new problem. The current episode started in the past 7 days. The problem has been gradually worsening since onset. The affected locations include the chest. The rash is characterized by blistering. He was exposed to nothing. Pertinent negatives include no anorexia, congestion, cough, diarrhea, fatigue or sore throat. Past treatments include nothing. The treatment provided no relief. There is no history of allergies.    Pt wants his Rx's to be transferred to [facility]  F/u urinary urgency, incontinence  The patient presents for a follow-up of  chronic hypertension, chronic dyslipidemia, type 2 diabetes, CAD controlled with medicines. F/u dry mouth - chronic. F/u urinary urgency incontinence, BPH and elev PSA.  F/u poor balance - cane  F/u both feet hurt on top L>R - chronic   Review of Systems  Constitutional: Negative for appetite change, fatigue and unexpected weight change.  HENT: Negative for congestion, nosebleeds, sneezing, sore throat and trouble swallowing.   Eyes: Positive for visual disturbance (blind). Negative for itching.  Respiratory: Negative for cough.   Cardiovascular: Negative for chest pain, palpitations and leg swelling.  Gastrointestinal: Negative for nausea, diarrhea, blood in stool, abdominal distention and anorexia.  Genitourinary: Positive for urgency, frequency and decreased urine volume. Negative for hematuria.  Musculoskeletal: Negative for back pain, joint swelling, gait problem and neck pain.  Skin: Positive for rash.  Neurological: Negative for dizziness, tremors, speech difficulty and weakness.  Psychiatric/Behavioral: Negative for suicidal ideas, sleep disturbance, dysphoric mood and agitation. The patient is not nervous/anxious.    BP Readings from Last 3 Encounters:  07/13/14 150/84  04/21/14 130/76  02/23/14 152/74   Wt Readings from Last 3 Encounters:  07/13/14 201 lb (91.173 kg)  04/21/14 202 lb (91.627 kg)  02/23/14  205 lb (92.987 kg)        Objective:   Physical Exam  Constitutional: He is oriented to person, place, and time. He appears well-developed. No distress.  Obese NAD  HENT:  Mouth/Throat: Oropharynx is clear and moist.  Eyes: Conjunctivae are normal. Pupils are equal, round, and reactive to light.  Neck: Normal range of motion. No JVD present. No thyromegaly present.  Cardiovascular: Normal rate, regular rhythm, normal heart sounds and intact distal pulses.  Exam reveals no gallop and no friction rub.   No murmur heard. Pulmonary/Chest: Effort normal and breath sounds normal. No respiratory distress. He has no wheezes. He has no rales. He exhibits no tenderness.  Abdominal: Soft. Bowel sounds are normal. He exhibits no distension and no mass. There is no tenderness. There is no rebound and no guarding.  Musculoskeletal: Normal range of motion. He exhibits no edema or tenderness.  Lymphadenopathy:    He has no cervical adenopathy.  Neurological: He is alert and oriented to person, place, and time. He has normal reflexes. No cranial nerve deficit. He exhibits normal muscle tone. He displays a negative Romberg sign. Coordination and gait normal.  No meningeal signs  Skin: Skin is warm and dry. No rash noted.  Psychiatric: He has a normal mood and affect. His behavior is normal. Judgment and thought content normal.  Blind 3 patches vesic rash on anter and poster chest  Lab Results  Component Value Date   WBC 8.0 08/13/2011   HGB 13.4 08/13/2011   HCT 40.5 08/13/2011   PLT 246.0 08/13/2011   GLUCOSE 62* 04/15/2014   CHOL 128 04/27/2013   TRIG 87.0 04/27/2013   HDL 41.60 04/27/2013   LDLCALC 69 04/27/2013  ALT 22 04/27/2013   AST 17 04/27/2013   NA 138 04/15/2014   K 4.3 04/15/2014   CL 106 04/15/2014   CREATININE 1.74* 04/15/2014   BUN 20 04/15/2014   CO2 26 04/15/2014   TSH 0.80 08/13/2011   PSA 9.36* 11/06/2013   HGBA1C 6.5 04/15/2014           Assessment & Plan:

## 2014-07-13 NOTE — Progress Notes (Signed)
Pre visit review using our clinic review tool, if applicable. No additional management support is needed unless otherwise documented below in the visit note. 

## 2014-07-13 NOTE — Assessment & Plan Note (Signed)
04/26/00 Cath  normal Left main, 70% stenosis mid LAD, 60% stenosis proximal CFX, 90% stenosis distal CFX, 50% stenosis mid RCA, 50% stenosis distal RCA  CABG  LIMA to LAD, SVG to int, SVG to OM, SVG to PD-PL 04/29/2000 Dr. Cyndia Bent  Lovastatin, ASA

## 2014-07-13 NOTE — Assessment & Plan Note (Addendum)
Valtrex Triamc oint Pain meds

## 2014-07-13 NOTE — Telephone Encounter (Signed)
Called pt no answer LMOM md sent oint to [facility]...Caleb Dunn

## 2014-07-13 NOTE — Assessment & Plan Note (Signed)
Start Linzess. 

## 2014-07-13 NOTE — Assessment & Plan Note (Signed)
Lovastatin 

## 2014-07-14 ENCOUNTER — Encounter: Payer: Self-pay | Admitting: Internal Medicine

## 2014-07-22 ENCOUNTER — Ambulatory Visit: Payer: Commercial Managed Care - HMO | Admitting: Internal Medicine

## 2014-08-13 ENCOUNTER — Ambulatory Visit (INDEPENDENT_AMBULATORY_CARE_PROVIDER_SITE_OTHER): Payer: Commercial Managed Care - HMO | Admitting: Internal Medicine

## 2014-08-13 ENCOUNTER — Encounter: Payer: Self-pay | Admitting: Internal Medicine

## 2014-08-13 VITALS — BP 158/82 | HR 78 | Ht 66.0 in | Wt 198.0 lb

## 2014-08-13 DIAGNOSIS — R29898 Other symptoms and signs involving the musculoskeletal system: Secondary | ICD-10-CM | POA: Diagnosis not present

## 2014-08-13 MED ORDER — METHYLPREDNISOLONE ACETATE 80 MG/ML IJ SUSP
80.0000 mg | Freq: Once | INTRAMUSCULAR | Status: AC
  Administered 2014-08-13: 80 mg via INTRAMUSCULAR

## 2014-08-13 NOTE — Assessment & Plan Note (Addendum)
Recurrent - 7/16 worse (x1 mo). Depo-medrol 80 mg IM Repeat MRI C spine Tramadol prn pain  2009 MRI CERVICAL SPINE WITHOUT CONTRAST  Technique: Multiplanar and multiecho pulse sequences of the cervical spine, to include the craniocervical junction and cervicothoracic junction, were obtained according to standard protocol without intravenous contrast.  Comparison: None  Findings: Alignment cervical spine shows straightening of the normal cervical lordosis centered around C4. Posterior fossa structures grossly appear within normal limits. Flow voids demonstrated within both vertebral arteries. Marrow signal is within normal limits. Paraspinal soft tissues unremarkable. The central canal is congenitally narrow.  C2-C3: Shallow posterior disc bulge without significant stenosis.  C3-C4: Right paracentral disc extrusion is present which in conjunction with central canal stenosis produces very moderate to severe central canal stenosis, with flattening of the cord. No edema. Mild bilateral uncovertebral spurring is present, right greater than left narrowing the foramina. A few millimeters of cranial and caudal extension of extruded disc material are noted at C3-C4.  C4-C5: Broad-based posterior disc osteophyte complex. Moderate to severe central canal stenosis due to disc osteophyte complex and congenitally narrow central canal. The bilateral neural foraminal narrowing secondary to uncovertebral spurring. Posterior longitudinal ligament thickening is present and some heterogeneous material is present anterior to the posterior longitudinal ligament, which may represent old extruded disc material versus subligamentous scar tissue from prior disc herniation.  C5-C6: Broad-based disc osteophyte complex, again with thickening of the posterior longitudinal ligament. Moderate central canal stenosis is present with flattening of the cord. Bilateral foraminal  narrowing is present, left greater than right due to uncovertebral spurring.  C6-C7: Mild thickening of the posterior longitudinal ligament without significant stenosis.  C7-T1: Mild bilateral facet arthrosis. Upper thoracic levels also appear within normal limits.  IMPRESSION: 1. 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 2. Multilevel thickening of the posterior longitudinal ligament and C3-C4 extruded disc in a right paracentral location. 3. Foraminal narrowing due to uncovertebral spurring.  Provider: Sammie Bench

## 2014-08-13 NOTE — Progress Notes (Signed)
Pre visit review using our clinic review tool, if applicable. No additional management support is needed unless otherwise documented below in the visit note. 

## 2014-08-15 NOTE — Progress Notes (Signed)
Subjective:  Patient ID: Caleb Nim Sr., male    DOB: 1940/03/28  Age: 74 y.o. MRN: [id number]  CC: No chief complaint on file.   HPI [employer] Sr. presents for L arm pain - recurrent problem  Outpatient Prescriptions Prior to Visit  Medication Sig Dispense Refill  . aspirin 81 MG tablet Take 1 tablet (81 mg total) by mouth daily. 100 tablet 3  . Blood Glucose Monitoring Suppl (PRODIGY BLOOD GLUCOSE MONITOR) DEVI As dirrected 1 each 0  . cholecalciferol (VITAMIN D) 1000 UNITS tablet Take 1 tablet (1,000 Units total) by mouth daily. 100 tablet 3  . clonazePAM (KLONOPIN) 0.25 MG disintegrating tablet Take 1 tablet (0.25 mg total) by mouth at bedtime as needed (insomnia). 30 tablet 5  . finasteride (PROSCAR) 5 MG tablet Take 1 tablet (5 mg total) by mouth daily. For Prostate 90 tablet 3  . glimepiride (AMARYL) 2 MG tablet Take 1 tablet (2 mg total) by mouth 2 (two) times daily. For Diabetes. 180 tablet 3  . glucose blood test strip 1 each by Other route 2 (two) times daily. Use to check blood sugars twice a day Dx E11.8 300 each 3  . Linaclotide (LINZESS) 290 MCG CAPS capsule Take 1 capsule (290 mcg total) by mouth daily. 30 capsule 11  . losartan (COZAAR) 50 MG tablet TAKE ONE TABLET BY MOUTH ONCE DAILY 90 tablet 3  . lovastatin (MEVACOR) 20 MG tablet Take 1 tablet (20 mg total) by mouth daily. For Cholesterol 90 tablet 3  . metFORMIN (GLUCOPHAGE) 500 MG tablet Take 1 tablet (500 mg total) by mouth daily with breakfast. 90 tablet 3  . metoprolol succinate (TOPROL-XL) 50 MG 24 hr tablet Take 1 tablet (50 mg total) by mouth daily. For Blood Pressure 90 tablet 3  . omeprazole (PRILOSEC) 20 MG capsule Take 2 capsules (40 mg total) by mouth daily. For Stomach 180 capsule 3  . PRODIGY LANCETS 28G MISC 28 g by Other route 2 (two) times daily. Use to help check blood sugars twice a day Dx E11.8 300 each 3  . tamsulosin (FLOMAX) 0.4 MG CAPS capsule Take 1 capsule (0.4 mg total) by mouth  daily. 90 capsule 3  . traMADol (ULTRAM) 50 MG tablet Take 1-2 tablets (50-100 mg total) by mouth 2 (two) times daily as needed. 60 tablet 3  . triamcinolone ointment (KENALOG) 0.5 % Apply 1 application topically 4 (four) times daily. 90 g 0  . valACYclovir (VALTREX) 1000 MG tablet Take 1 tablet (1,000 mg total) by mouth 3 (three) times daily. 21 tablet 0  . verapamil (VERELAN PM) 180 MG 24 hr capsule Take 1 capsule (180 mg total) by mouth at bedtime. 90 capsule 3   No facility-administered medications prior to visit.    ROS Review of Systems  Constitutional: Negative for appetite change, fatigue and unexpected weight change.  HENT: Negative for congestion, nosebleeds, sneezing, sore throat and trouble swallowing.   Eyes: Positive for visual disturbance. Negative for itching.  Respiratory: Negative for cough.   Cardiovascular: Negative for chest pain, palpitations and leg swelling.  Gastrointestinal: Negative for nausea, diarrhea, blood in stool and abdominal distention.  Genitourinary: Negative for frequency and hematuria.  Musculoskeletal: Negative for back pain, joint swelling, gait problem and neck pain.  Skin: Negative for rash.  Neurological: Positive for weakness and numbness. Negative for dizziness, tremors and speech difficulty.  Psychiatric/Behavioral: Negative for sleep disturbance, dysphoric mood and agitation. The patient is not nervous/anxious.     Objective:  BP 158/82 mmHg  Pulse 78  Ht 5\' 6"  (1.676 m)  Wt 198 lb (89.812 kg)  BMI 31.97 kg/m2  SpO2 98%  BP Readings from Last 3 Encounters:  08/13/14 158/82  07/13/14 150/84  04/21/14 130/76    Wt Readings from Last 3 Encounters:  08/13/14 198 lb (89.812 kg)  07/13/14 201 lb (91.173 kg)  04/21/14 202 lb (91.627 kg)    Physical Exam  Constitutional: He is oriented to person, place, and time. He appears well-developed. No distress.  NAD  HENT:  Mouth/Throat: Oropharynx is clear and moist.  Eyes:  Blind     Neck: Normal range of motion. No JVD present. No thyromegaly present.  Cardiovascular: Normal rate, regular rhythm, normal heart sounds and intact distal pulses.  Exam reveals no gallop and no friction rub.   No murmur heard. Pulmonary/Chest: Effort normal and breath sounds normal. No respiratory distress. He has no wheezes. He has no rales. He exhibits no tenderness.  Abdominal: Soft. Bowel sounds are normal. He exhibits no distension and no mass. There is no tenderness. There is no rebound and no guarding.  Musculoskeletal: Normal range of motion. He exhibits no edema or tenderness.  Lymphadenopathy:    He has no cervical adenopathy.  Neurological: He is alert and oriented to person, place, and time. He has normal reflexes. No cranial nerve deficit. He exhibits normal muscle tone. He displays a negative Romberg sign. Coordination and gait normal.  Skin: Skin is warm and dry. No rash noted.  Psychiatric: He has a normal mood and affect. His behavior is normal. Judgment and thought content normal.  L grip 5-/5  Lab Results  Component Value Date   WBC 8.0 08/13/2011   HGB 13.4 08/13/2011   HCT 40.5 08/13/2011   PLT 246.0 08/13/2011   GLUCOSE 62* 04/15/2014   CHOL 128 04/27/2013   TRIG 87.0 04/27/2013   HDL 41.60 04/27/2013   LDLCALC 69 04/27/2013   ALT 22 04/27/2013   AST 17 04/27/2013   NA 138 04/15/2014   K 4.3 04/15/2014   CL 106 04/15/2014   CREATININE 1.74* 04/15/2014   BUN 20 04/15/2014   CO2 26 04/15/2014   TSH 0.80 08/13/2011   PSA 9.36* 11/06/2013   HGBA1C 6.5 04/15/2014    No results found.  Assessment & Plan:   Diagnoses and all orders for this visit:  Left arm weakness Orders: -     MR Cervical Spine Wo Contrast; Future -     methylPREDNISolone acetate (DEPO-MEDROL) injection 80 mg; Inject 1 mL (80 mg total) into the muscle once.   I am having Caleb Dunn maintain his PRODIGY BLOOD GLUCOSE MONITOR, clonazePAM, glucose blood, PRODIGY LANCETS 28G,  glimepiride, cholecalciferol, metFORMIN, losartan, aspirin, finasteride, lovastatin, metoprolol succinate, omeprazole, tamsulosin, verapamil, triamcinolone ointment, valACYclovir, traMADol, and Linaclotide. We administered methylPREDNISolone acetate.  Meds ordered this encounter  Medications  . methylPREDNISolone acetate (DEPO-MEDROL) injection 80 mg    Sig:      Follow-up: Return in about 3 weeks (around 09/03/2014) for a follow-up visit.  Walker Kehr, MD

## 2014-08-27 ENCOUNTER — Ambulatory Visit
Admission: RE | Admit: 2014-08-27 | Discharge: 2014-08-27 | Disposition: A | Discharge status: Home / Self Care | Payer: Commercial Managed Care - HMO | Source: Ambulatory Visit | Attending: Internal Medicine | Admitting: Internal Medicine

## 2014-08-27 DIAGNOSIS — M47812 Spondylosis without myelopathy or radiculopathy, cervical region: Secondary | ICD-10-CM | POA: Diagnosis not present

## 2014-08-27 DIAGNOSIS — M5032 Other cervical disc degeneration, mid-cervical region: Secondary | ICD-10-CM | POA: Diagnosis not present

## 2014-08-27 DIAGNOSIS — M4802 Spinal stenosis, cervical region: Secondary | ICD-10-CM | POA: Diagnosis not present

## 2014-08-27 DIAGNOSIS — R29898 Other symptoms and signs involving the musculoskeletal system: Secondary | ICD-10-CM

## 2014-09-06 ENCOUNTER — Ambulatory Visit (INDEPENDENT_AMBULATORY_CARE_PROVIDER_SITE_OTHER): Payer: Commercial Managed Care - HMO | Admitting: Internal Medicine

## 2014-09-06 ENCOUNTER — Encounter: Payer: Self-pay | Admitting: Internal Medicine

## 2014-09-06 VITALS — BP 150/80 | HR 76 | Wt 196.0 lb

## 2014-09-06 DIAGNOSIS — E1139 Type 2 diabetes mellitus with other diabetic ophthalmic complication: Secondary | ICD-10-CM | POA: Diagnosis not present

## 2014-09-06 DIAGNOSIS — M5010 Cervical disc disorder with radiculopathy, unspecified cervical region: Secondary | ICD-10-CM

## 2014-09-06 MED ORDER — METHYLPREDNISOLONE ACETATE 80 MG/ML IJ SUSP
80.0000 mg | Freq: Once | INTRAMUSCULAR | Status: AC
  Administered 2014-09-06: 80 mg via INTRAMUSCULAR

## 2014-09-06 NOTE — Progress Notes (Signed)
Pre visit review using our clinic review tool, if applicable. No additional management support is needed unless otherwise documented below in the visit note. 

## 2014-09-06 NOTE — Assessment & Plan Note (Signed)
Cont Glimeperide

## 2014-09-06 NOTE — Progress Notes (Signed)
Subjective:  Patient ID: Caleb Nim Sr., male    DOB: [birth year]  Age: 74 y.o. MRN: [id number]  CC: No chief complaint on file.   HPI   [employer] presents for L hand pain and weakness - 10% better per pt. Pain is 20-30 % better  Outpatient Prescriptions Prior to Visit  Medication Sig Dispense Refill  . aspirin 81 MG tablet Take 1 tablet (81 mg total) by mouth daily. 100 tablet 3  . Blood Glucose Monitoring Suppl (PRODIGY BLOOD GLUCOSE MONITOR) DEVI As dirrected 1 each 0  . cholecalciferol (VITAMIN D) 1000 UNITS tablet Take 1 tablet (1,000 Units total) by mouth daily. 100 tablet 3  . clonazePAM (KLONOPIN) 0.25 MG disintegrating tablet Take 1 tablet (0.25 mg total) by mouth at bedtime as needed (insomnia). 30 tablet 5  . finasteride (PROSCAR) 5 MG tablet Take 1 tablet (5 mg total) by mouth daily. For Prostate 90 tablet 3  . glimepiride (AMARYL) 2 MG tablet Take 1 tablet (2 mg total) by mouth 2 (two) times daily. For Diabetes. 180 tablet 3  . glucose blood test strip 1 each by Other route 2 (two) times daily. Use to check blood sugars twice a day Dx E11.8 300 each 3  . Linaclotide (LINZESS) 290 MCG CAPS capsule Take 1 capsule (290 mcg total) by mouth daily. 30 capsule 11  . losartan (COZAAR) 50 MG tablet TAKE ONE TABLET BY MOUTH ONCE DAILY 90 tablet 3  . lovastatin (MEVACOR) 20 MG tablet Take 1 tablet (20 mg total) by mouth daily. For Cholesterol 90 tablet 3  . metFORMIN (GLUCOPHAGE) 500 MG tablet Take 1 tablet (500 mg total) by mouth daily with breakfast. 90 tablet 3  . metoprolol succinate (TOPROL-XL) 50 MG 24 hr tablet Take 1 tablet (50 mg total) by mouth daily. For Blood Pressure 90 tablet 3  . omeprazole (PRILOSEC) 20 MG capsule Take 2 capsules (40 mg total) by mouth daily. For Stomach 180 capsule 3  . PRODIGY LANCETS 28G MISC 28 g by Other route 2 (two) times daily. Use to help check blood sugars twice a day Dx E11.8 300 each 3  . tamsulosin (FLOMAX) 0.4 MG CAPS  capsule Take 1 capsule (0.4 mg total) by mouth daily. 90 capsule 3  . traMADol (ULTRAM) 50 MG tablet Take 1-2 tablets (50-100 mg total) by mouth 2 (two) times daily as needed. 60 tablet 3  . triamcinolone ointment (KENALOG) 0.5 % Apply 1 application topically 4 (four) times daily. 90 g 0  . valACYclovir (VALTREX) 1000 MG tablet Take 1 tablet (1,000 mg total) by mouth 3 (three) times daily. 21 tablet 0  . verapamil (VERELAN PM) 180 MG 24 hr capsule Take 1 capsule (180 mg total) by mouth at bedtime. 90 capsule 3   No facility-administered medications prior to visit.    ROS Review of Systems  Constitutional: Positive for fatigue. Negative for appetite change and unexpected weight change.  HENT: Negative for congestion, nosebleeds, sneezing, sore throat and trouble swallowing.   Eyes: Positive for visual disturbance. Negative for itching.  Respiratory: Negative for cough.   Cardiovascular: Negative for chest pain, palpitations and leg swelling.  Gastrointestinal: Negative for nausea, diarrhea, blood in stool and abdominal distention.  Genitourinary: Negative for urgency, frequency and hematuria.  Musculoskeletal: Negative for back pain, joint swelling, gait problem and neck pain.  Skin: Negative for rash and wound.  Neurological: Positive for weakness and numbness. Negative for dizziness, tremors and speech difficulty.  Psychiatric/Behavioral: Negative for  suicidal ideas, sleep disturbance, dysphoric mood and agitation. The patient is not nervous/anxious.     Objective:  BP 150/80 mmHg  Pulse 76  Wt 196 lb (88.905 kg)  SpO2 98%  BP Readings from Last 3 Encounters:  09/06/14 150/80  08/13/14 158/82  07/13/14 150/84    Wt Readings from Last 3 Encounters:  09/06/14 196 lb (88.905 kg)  08/13/14 198 lb (89.812 kg)  07/13/14 201 lb (91.173 kg)    Physical Exam  Constitutional: He is oriented to person, place, and time. He appears well-developed. No distress.  NAD  HENT:    Mouth/Throat: Oropharynx is clear and moist.  Eyes: Conjunctivae are normal. Pupils are equal, round, and reactive to light.  Neck: Normal range of motion. No JVD present. No thyromegaly present.  Cardiovascular: Normal rate, regular rhythm, normal heart sounds and intact distal pulses.  Exam reveals no gallop and no friction rub.   No murmur heard. Pulmonary/Chest: Effort normal and breath sounds normal. No respiratory distress. He has no wheezes. He has no rales. He exhibits no tenderness.  Abdominal: Soft. Bowel sounds are normal. He exhibits no distension and no mass. There is no tenderness. There is no rebound and no guarding.  Musculoskeletal: Normal range of motion. He exhibits tenderness. He exhibits no edema.  Lymphadenopathy:    He has no cervical adenopathy.  Neurological: He is alert and oriented to person, place, and time. He has normal reflexes. No cranial nerve deficit. He exhibits normal muscle tone. He displays a negative Romberg sign. Coordination abnormal. Gait normal.  Skin: Skin is warm and dry. No rash noted.  Psychiatric: He has a normal mood and affect. His behavior is normal. Judgment and thought content normal.  Blind L neck/shoulder w/less pain - 30% better L grip 5-/5 - better  Lab Results  Component Value Date   WBC 8.0 08/13/2011   HGB 13.4 08/13/2011   HCT 40.5 08/13/2011   PLT 246.0 08/13/2011   GLUCOSE 62* 04/15/2014   CHOL 128 04/27/2013   TRIG 87.0 04/27/2013   HDL 41.60 04/27/2013   LDLCALC 69 04/27/2013   ALT 22 04/27/2013   AST 17 04/27/2013   NA 138 04/15/2014   K 4.3 04/15/2014   CL 106 04/15/2014   CREATININE 1.74* 04/15/2014   BUN 20 04/15/2014   CO2 26 04/15/2014   TSH 0.80 08/13/2011   PSA 9.36* 11/06/2013   HGBA1C 6.5 04/15/2014    Mr Cervical Spine Wo Contrast  08/28/2014   CLINICAL DATA:  Acute LEFT-sided neck pain that radiates into the shoulder and causes weakness. Symptom duration for 2 months.  EXAM: MRI CERVICAL SPINE  WITHOUT CONTRAST  TECHNIQUE: Multiplanar, multisequence MR imaging of the cervical spine was performed. No intravenous contrast was administered.  COMPARISON:  MRI cervical spine 07/15/2007.  FINDINGS: Alignment: Mild straightening of the normal cervical lordosis. No subluxation.  Vertebrae: No worrisome osseous lesion.  Cord: Multilevel cord compression questionable abnormal cord signal at C3-4, C4-5, and C5-6 related to stenosis.  Posterior Fossa: No tonsillar herniation.  Vertebral Arteries: Patent and equal.  Paraspinal tissues: The thyroid gland may be slightly enlarged. This is incompletely evaluated on today's exam. Consider thyroid sonography if there are palpable abnormalities. The gland appears similar to priors.  Disc levels:  The individual disc spaces were examined as follows:  C2-3: Slight bulge. Ossification of the posterior longitudinal ligament. No impingement.  C3-4: Superimposed central disc protrusion along with ossification of the posterior longitudinal ligament. Moderate cord flattening. Canal  diameter 5-6 mm. BILATERAL foraminal narrowing due to uncinate spurring.  C4-5: Moderate disc space narrowing. Central disc osteophyte complex with ossification of the posterior longitudinal ligament. LEFT greater than RIGHT uncinate spurring results in foraminal narrowing potentially affecting the C5 nerves. Mild cord flattening. Canal diameter 5-6 mm.  C5-6: Advanced disc space narrowing. Central disc protrusion with ossification of the posterior longitudinal ligament. LEFT greater than RIGHT foraminal narrowing due to disc material and uncinate spurring. Mild cord flattening. Canal diameter 6 mm. LEFT greater than RIGHT C6 nerve root impingement.  C6-7: Shallow central protrusion. Slight ossification of the posterior longitudinal ligament terminating at C6. Slight effacement anterior subarachnoid space without cord compression or foraminal narrowing.  C7-T1: Central and leftward protrusion. Mild facet  arthropathy. LEFT C8 nerve root impingement not excluded.  Compared with priors, a similar appearance is noted.  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 Multilevel foraminal narrowing and spinal stenosis with questionable abnormal cord signal at C3-4, C4-5, and C5-6. Similar appearance to priors.  Potentially symptomatic LEFT-sided neural impingement most likely at C4-5, C5-6, and C7-T1. See discussion above.   Electronically Signed   By: Staci Righter M.D.   On: 08/28/2014 10:34    Assessment & Plan:   Diagnoses and all orders for this visit:  Cervical disc disorder with radiculopathy -     Ambulatory referral to Neurosurgery -     methylPREDNISolone acetate (DEPO-MEDROL) injection 80 mg; Inject 1 mL (80 mg total) into the muscle once.  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 Mehrotra maintain his PRODIGY BLOOD GLUCOSE MONITOR, clonazePAM, glucose blood, PRODIGY LANCETS 28G, glimepiride, cholecalciferol, metFORMIN, losartan, aspirin, finasteride, lovastatin, metoprolol succinate, omeprazole, tamsulosin, verapamil, triamcinolone ointment, valACYclovir, traMADol, and Linaclotide. We administered methylPREDNISolone acetate.  Meds ordered this encounter  Medications  . methylPREDNISolone acetate (DEPO-MEDROL) injection 80 mg    Sig:      Follow-up: Return in about 3 months (around 12/07/2014) for a follow-up visit.  Walker Kehr, MD

## 2014-09-06 NOTE — Assessment & Plan Note (Signed)
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 Multilevel foraminal narrowing and spinal stenosis with questionable abnormal cord signal at C3-4, C4-5, and C5-6. Similar appearance to priors.  Potentially symptomatic LEFT-sided neural impingement most likely at C4-5, C5-6, and C7-T1. See discussion above.   Electronically Signed   By: Staci Righter M.D.   On: 08/28/2014 10:34   Depomedrol 80 mg IM NS consult

## 2014-09-20 ENCOUNTER — Telehealth: Payer: Self-pay | Admitting: Internal Medicine

## 2014-09-20 NOTE — Telephone Encounter (Signed)
Noted. Thx.

## 2014-09-20 NOTE — Telephone Encounter (Signed)
Per  Levada Dy at [facility], pt declined appt.

## 2014-09-30 ENCOUNTER — Telehealth: Payer: Self-pay | Admitting: Internal Medicine

## 2014-09-30 NOTE — Telephone Encounter (Signed)
Pt called in said that the traMADol (ULTRAM) 50 MG tablet [id number] is not working and he is taking like the dr told him.  He wants to know is there anything else he can take?

## 2014-10-01 ENCOUNTER — Other Ambulatory Visit: Payer: Self-pay

## 2014-10-01 MED ORDER — TRAMADOL HCL 50 MG PO TABS: 50.0000 mg | ORAL_TABLET | Freq: Two times a day (BID) | ORAL | Status: DC | PRN

## 2014-10-01 NOTE — Telephone Encounter (Signed)
Pt was not requesting a refill for tramadol. I called pt. He states he was having constipation and he took MOM and has had a BM. I stressed to him that Tramadol is NOT for constipation or GI issues. I advised him to make sure whoever is helping him with his meds to make sure they know Tramadol is for pain. He verbalized understanding and thanked me for calling.

## 2014-10-01 NOTE — Telephone Encounter (Signed)
OK to fill this prescription with additional refills x3 Thank you!  

## 2014-10-20 ENCOUNTER — Ambulatory Visit: Payer: Commercial Managed Care - HMO | Admitting: Internal Medicine

## 2014-11-16 DIAGNOSIS — E784 Other hyperlipidemia: Secondary | ICD-10-CM | POA: Diagnosis not present

## 2014-11-16 DIAGNOSIS — N183 Chronic kidney disease, stage 3 (moderate): Secondary | ICD-10-CM | POA: Diagnosis not present

## 2014-11-16 DIAGNOSIS — I119 Hypertensive heart disease without heart failure: Secondary | ICD-10-CM | POA: Diagnosis not present

## 2014-11-16 DIAGNOSIS — I2510 Atherosclerotic heart disease of native coronary artery without angina pectoris: Secondary | ICD-10-CM | POA: Diagnosis not present

## 2014-11-23 ENCOUNTER — Other Ambulatory Visit (INDEPENDENT_AMBULATORY_CARE_PROVIDER_SITE_OTHER): Payer: Commercial Managed Care - HMO

## 2014-11-23 ENCOUNTER — Encounter: Payer: Self-pay | Admitting: Internal Medicine

## 2014-11-23 ENCOUNTER — Ambulatory Visit (INDEPENDENT_AMBULATORY_CARE_PROVIDER_SITE_OTHER): Payer: Commercial Managed Care - HMO | Admitting: Internal Medicine

## 2014-11-23 VITALS — BP 120/84 | HR 69 | Temp 97.5°F | Wt 198.0 lb

## 2014-11-23 DIAGNOSIS — R972 Elevated prostate specific antigen [PSA]: Secondary | ICD-10-CM

## 2014-11-23 DIAGNOSIS — N320 Bladder-neck obstruction: Secondary | ICD-10-CM

## 2014-11-23 DIAGNOSIS — Z23 Encounter for immunization: Secondary | ICD-10-CM | POA: Diagnosis not present

## 2014-11-23 DIAGNOSIS — M5010 Cervical disc disorder with radiculopathy, unspecified cervical region: Secondary | ICD-10-CM

## 2014-11-23 DIAGNOSIS — I25110 Atherosclerotic heart disease of native coronary artery with unstable angina pectoris: Secondary | ICD-10-CM

## 2014-11-23 LAB — BASIC METABOLIC PANEL
BUN: 28 mg/dL — ABNORMAL HIGH (ref 6–23)
CALCIUM: 10.1 mg/dL (ref 8.4–10.5)
CO2: 28 meq/L (ref 19–32)
Chloride: 105 mEq/L (ref 96–112)
Creatinine, Ser: 1.91 mg/dL — ABNORMAL HIGH (ref 0.40–1.50)
GFR: 44.38 mL/min — AB (ref >60.00)
Glucose, Bld: 89 mg/dL (ref 70–99)
Potassium: 5 mEq/L (ref 3.5–5.1)
SODIUM: 140 meq/L (ref 135–145)

## 2014-11-23 LAB — HEMOGLOBIN A1C: HEMOGLOBIN A1C: 7.1 % — AB (ref 4.6–6.5)

## 2014-11-23 LAB — PSA: PSA: 8.93 ng/mL — AB (ref 0.10–4.00)

## 2014-11-23 MED ORDER — OXYBUTYNIN CHLORIDE 5 MG PO TABS: ORAL_TABLET | ORAL | Status: DC

## 2014-11-23 NOTE — Progress Notes (Signed)
Pre visit review using our clinic review tool, if applicable. No additional management support is needed unless otherwise documented below in the visit note. 

## 2014-11-23 NOTE — Assessment & Plan Note (Signed)
Lab Results  Component Value Date   PSA 9.36* 11/06/2013   PSA 7.46* 12/29/2012   PSA 7.11* 10/25/2011  Check PSA On Proscar

## 2014-11-23 NOTE — Assessment & Plan Note (Signed)
3/13 w/urge incontinence and overactive bladder sx's at night 10/16 worse Start Oxybutinine at hs

## 2014-11-23 NOTE — Addendum Note (Signed)
Addended by: Cresenciano Lick on: 11/23/2014 05:03 PM   Modules accepted: Orders

## 2014-11-23 NOTE — Progress Notes (Signed)
Subjective:  Patient ID: Caleb Nim Sr., male    DOB: 04/29/1940  Age: 74 y.o. MRN: [id number]  CC: No chief complaint on file.   HPI [employer] Sr[employer] presents for anxiety, nocturia, DM f/up  Outpatient Prescriptions Prior to Visit  Medication Sig Dispense Refill  . aspirin 81 MG tablet Take 1 tablet (81 mg total) by mouth daily. 100 tablet 3  . Blood Glucose Monitoring Suppl (PRODIGY BLOOD GLUCOSE MONITOR) DEVI As dirrected 1 each 0  . cholecalciferol (VITAMIN D) 1000 UNITS tablet Take 1 tablet (1,000 Units total) by mouth daily. 100 tablet 3  . clonazePAM (KLONOPIN) 0.25 MG disintegrating tablet Take 1 tablet (0.25 mg total) by mouth at bedtime as needed (insomnia). 30 tablet 5  . finasteride (PROSCAR) 5 MG tablet Take 1 tablet (5 mg total) by mouth daily. For Prostate 90 tablet 3  . glimepiride (AMARYL) 2 MG tablet Take 1 tablet (2 mg total) by mouth 2 (two) times daily. For Diabetes. 180 tablet 3  . glucose blood test strip 1 each by Other route 2 (two) times daily. Use to check blood sugars twice a day Dx E11.8 300 each 3  . Linaclotide (LINZESS) 290 MCG CAPS capsule Take 1 capsule (290 mcg total) by mouth daily. 30 capsule 11  . losartan (COZAAR) 50 MG tablet TAKE ONE TABLET BY MOUTH ONCE DAILY 90 tablet 3  . lovastatin (MEVACOR) 20 MG tablet Take 1 tablet (20 mg total) by mouth daily. For Cholesterol 90 tablet 3  . metFORMIN (GLUCOPHAGE) 500 MG tablet Take 1 tablet (500 mg total) by mouth daily with breakfast. 90 tablet 3  . metoprolol succinate (TOPROL-XL) 50 MG 24 hr tablet Take 1 tablet (50 mg total) by mouth daily. For Blood Pressure 90 tablet 3  . omeprazole (PRILOSEC) 20 MG capsule Take 2 capsules (40 mg total) by mouth daily. For Stomach 180 capsule 3  . PRODIGY LANCETS 28G MISC 28 g by Other route 2 (two) times daily. Use to help check blood sugars twice a day Dx E11.8 300 each 3  . tamsulosin (FLOMAX) 0.4 MG CAPS capsule Take 1 capsule (0.4 mg total) by mouth  daily. 90 capsule 3  . traMADol (ULTRAM) 50 MG tablet Take 1-2 tablets (50-100 mg total) by mouth 2 (two) times daily as needed. 60 tablet 3  . triamcinolone ointment (KENALOG) 0.5 % Apply 1 application topically 4 (four) times daily. 90 g 0  . valACYclovir (VALTREX) 1000 MG tablet Take 1 tablet (1,000 mg total) by mouth 3 (three) times daily. 21 tablet 0  . verapamil (VERELAN PM) 180 MG 24 hr capsule Take 1 capsule (180 mg total) by mouth at bedtime. 90 capsule 3   No facility-administered medications prior to visit.    ROS Review of Systems  Constitutional: Negative for appetite change, fatigue and unexpected weight change.  HENT: Negative for congestion, nosebleeds, sneezing, sore throat and trouble swallowing.   Eyes: Positive for visual disturbance. Negative for itching.  Respiratory: Negative for cough.   Cardiovascular: Negative for chest pain, palpitations and leg swelling.  Gastrointestinal: Negative for nausea, diarrhea, blood in stool and abdominal distention.  Genitourinary: Positive for urgency and frequency. Negative for hematuria.  Musculoskeletal: Negative for back pain, joint swelling, gait problem and neck pain.  Skin: Negative for rash.  Neurological: Negative for dizziness, tremors, speech difficulty and weakness.  Psychiatric/Behavioral: Negative for sleep disturbance, dysphoric mood and agitation. The patient is not nervous/anxious.     Objective:  BP 120/84  mmHg  Pulse 69  Temp(Src) 97.5 F (36.4 C) (Oral)  Wt 198 lb (89.812 kg)  SpO2 99%  BP Readings from Last 3 Encounters:  11/23/14 120/84  09/06/14 150/80  08/13/14 158/82    Wt Readings from Last 3 Encounters:  11/23/14 198 lb (89.812 kg)  09/06/14 196 lb (88.905 kg)  08/13/14 198 lb (89.812 kg)    Physical Exam  Constitutional: He is oriented to person, place, and time. He appears well-developed. No distress.  NAD  HENT:  Mouth/Throat: Oropharynx is clear and moist. No oropharyngeal exudate.    Eyes: Conjunctivae are normal. Pupils are equal, round, and reactive to light.  Neck: Normal range of motion. No JVD present. No thyromegaly present.  Cardiovascular: Normal rate, regular rhythm, normal heart sounds and intact distal pulses.  Exam reveals no gallop and no friction rub.   No murmur heard. Pulmonary/Chest: Effort normal and breath sounds normal. No respiratory distress. He has no wheezes. He has no rales. He exhibits no tenderness.  Abdominal: Soft. Bowel sounds are normal. He exhibits no distension and no mass. There is no tenderness. There is no rebound and no guarding.  Musculoskeletal: Normal range of motion. He exhibits no edema or tenderness.  Lymphadenopathy:    He has no cervical adenopathy.  Neurological: He is alert and oriented to person, place, and time. He has normal reflexes. No cranial nerve deficit. He exhibits normal muscle tone. He displays a negative Romberg sign. Coordination and gait normal.  Skin: Skin is warm and dry. No rash noted.  Psychiatric: He has a normal mood and affect. His behavior is normal. Judgment and thought content normal.    Lab Results  Component Value Date   WBC 8.0 08/13/2011   HGB 13.4 08/13/2011   HCT 40.5 08/13/2011   PLT 246.0 08/13/2011   GLUCOSE 62* 04/15/2014   CHOL 128 04/27/2013   TRIG 87.0 04/27/2013   HDL 41.60 04/27/2013   LDLCALC 69 04/27/2013   ALT 22 04/27/2013   AST 17 04/27/2013   NA 138 04/15/2014   K 4.3 04/15/2014   CL 106 04/15/2014   CREATININE 1.74* 04/15/2014   BUN 20 04/15/2014   CO2 26 04/15/2014   TSH 0.80 08/13/2011   PSA 9.36* 11/06/2013   HGBA1C 6.5 04/15/2014    Mr Cervical Spine Wo Contrast  08/28/2014  CLINICAL DATA:  Acute LEFT-sided neck pain that radiates into the shoulder and causes weakness. Symptom duration for 2 months. EXAM: MRI CERVICAL SPINE WITHOUT CONTRAST TECHNIQUE: Multiplanar, multisequence MR imaging of the cervical spine was performed. No intravenous contrast was  administered. COMPARISON:  MRI cervical spine 07/15/2007. FINDINGS: Alignment: Mild straightening of the normal cervical lordosis. No subluxation. Vertebrae: No worrisome osseous lesion. Cord: Multilevel cord compression questionable abnormal cord signal at C3-4, C4-5, and C5-6 related to stenosis. Posterior Fossa: No tonsillar herniation. Vertebral Arteries: Patent and equal. Paraspinal tissues: The thyroid gland may be slightly enlarged. This is incompletely evaluated on today's exam. Consider thyroid sonography if there are palpable abnormalities. The gland appears similar to priors. Disc levels: The individual disc spaces were examined as follows: C2-3: Slight bulge. Ossification of the posterior longitudinal ligament. No impingement. C3-4: Superimposed central disc protrusion along with ossification of the posterior longitudinal ligament. Moderate cord flattening. Canal diameter 5-6 mm. BILATERAL foraminal narrowing due to uncinate spurring. C4-5: Moderate disc space narrowing. Central disc osteophyte complex with ossification of the posterior longitudinal ligament. LEFT greater than RIGHT uncinate spurring results in foraminal narrowing potentially affecting  the C5 nerves. Mild cord flattening. Canal diameter 5-6 mm. C5-6: Advanced disc space narrowing. Central disc protrusion with ossification of the posterior longitudinal ligament. LEFT greater than RIGHT foraminal narrowing due to disc material and uncinate spurring. Mild cord flattening. Canal diameter 6 mm. LEFT greater than RIGHT C6 nerve root impingement. C6-7: Shallow central protrusion. Slight ossification of the posterior longitudinal ligament terminating at C6. Slight effacement anterior subarachnoid space without cord compression or foraminal narrowing. C7-T1: Central and leftward protrusion. Mild facet arthropathy. LEFT C8 nerve root impingement not excluded. Compared with priors, a similar appearance is noted. 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 Multilevel foraminal narrowing and spinal stenosis with questionable abnormal cord signal at C3-4, C4-5, and C5-6. Similar appearance to priors. Potentially symptomatic LEFT-sided neural impingement most likely at C4-5, C5-6, and C7-T1. See discussion above. Electronically Signed   By: Staci Righter M.D.   On: 08/28/2014 10:34    Assessment & Plan:   There are no diagnoses linked to this encounter. I am having Mr. Blok maintain his PRODIGY BLOOD GLUCOSE MONITOR, clonazePAM, glucose blood, PRODIGY LANCETS 28G, glimepiride, cholecalciferol, metFORMIN, losartan, aspirin, finasteride, lovastatin, metoprolol succinate, omeprazole, tamsulosin, verapamil, triamcinolone ointment, valACYclovir, Linaclotide, and traMADol.  No orders of the defined types were placed in this encounter.     Follow-up: No Follow-up on file.  Walker Kehr, MD

## 2014-11-23 NOTE — Assessment & Plan Note (Signed)
Lovastatin, ASA 

## 2014-11-23 NOTE — Assessment & Plan Note (Signed)
Resolved

## 2014-12-07 ENCOUNTER — Ambulatory Visit: Payer: Commercial Managed Care - HMO | Admitting: Internal Medicine

## 2014-12-22 ENCOUNTER — Telehealth: Payer: Self-pay | Admitting: *Deleted

## 2014-12-22 MED ORDER — AZITHROMYCIN 250 MG PO TABS: ORAL_TABLET | ORAL | Status: DC

## 2014-12-22 NOTE — Telephone Encounter (Signed)
Notified pt ,ed sent to [facility]...Caleb Dunn

## 2014-12-22 NOTE — Telephone Encounter (Signed)
Left msg on triage stating he has a chest cold, coughing up yellowish phlegm. Wanting md to call in antibiotic...Caleb Dunn

## 2014-12-22 NOTE — Telephone Encounter (Signed)
Ok Zpac Thx 

## 2015-01-26 ENCOUNTER — Telehealth: Payer: Self-pay | Admitting: Internal Medicine

## 2015-01-26 MED ORDER — FINASTERIDE 5 MG PO TABS: 5.0000 mg | ORAL_TABLET | Freq: Every day | ORAL | Status: DC

## 2015-01-26 MED ORDER — METFORMIN HCL 500 MG PO TABS: 500.0000 mg | ORAL_TABLET | Freq: Every day | ORAL | Status: DC

## 2015-01-26 NOTE — Telephone Encounter (Signed)
Patient is requesting refill for metFORMIN (GLUCOPHAGE) 500 MG tablet [id number] and the medication that is prescribed for his prostate. He didn't know the name of it.  Pharmacy is [facility]

## 2015-01-26 NOTE — Telephone Encounter (Signed)
Done. See meds. Pt informed

## 2015-03-09 ENCOUNTER — Encounter: Payer: Self-pay | Admitting: Internal Medicine

## 2015-03-09 ENCOUNTER — Ambulatory Visit (INDEPENDENT_AMBULATORY_CARE_PROVIDER_SITE_OTHER): Payer: Commercial Managed Care - HMO | Admitting: Internal Medicine

## 2015-03-09 VITALS — BP 120/70 | HR 79 | Wt 201.0 lb

## 2015-03-09 DIAGNOSIS — E1139 Type 2 diabetes mellitus with other diabetic ophthalmic complication: Secondary | ICD-10-CM | POA: Diagnosis not present

## 2015-03-09 DIAGNOSIS — G4700 Insomnia, unspecified: Secondary | ICD-10-CM | POA: Diagnosis not present

## 2015-03-09 DIAGNOSIS — H5316 Psychophysical visual disturbances: Secondary | ICD-10-CM

## 2015-03-09 DIAGNOSIS — M792 Neuralgia and neuritis, unspecified: Secondary | ICD-10-CM | POA: Insufficient documentation

## 2015-03-09 MED ORDER — GABAPENTIN 300 MG PO CAPS: 300.0000 mg | ORAL_CAPSULE | Freq: Three times a day (TID) | ORAL | Status: DC

## 2015-03-09 NOTE — Assessment & Plan Note (Signed)
On Glimeperide

## 2015-03-09 NOTE — Progress Notes (Signed)
Pre visit review using our clinic review tool, if applicable. No additional management support is needed unless otherwise documented below in the visit note. 

## 2015-03-09 NOTE — Patient Instructions (Signed)
Stop Clonazepam, start Gabapentin at bedtime

## 2015-03-09 NOTE — Assessment & Plan Note (Addendum)
2/17 the pt has feelings at night when he is asleep or falling asleep: it feels like someone would hit or grab him on the L side. Stop Clonazepam, start Gabapentin at bedtime

## 2015-03-09 NOTE — Assessment & Plan Note (Signed)
Stop Clonazepam, start Gabapentin at bedtime

## 2015-03-09 NOTE — Assessment & Plan Note (Signed)
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

## 2015-03-09 NOTE — Progress Notes (Signed)
Subjective:  Patient ID: Caleb Nim Sr., male    DOB: 01/13/1941  Age: 75 y.o. MRN: [id number]  CC: No chief complaint on file.   HPI [name] presents for feelings at night when he is asleep or falling asleep: it feels like someone would hit or grab him on the L side. F/u DM  Outpatient Prescriptions Prior to Visit  Medication Sig Dispense Refill  . aspirin 81 MG tablet Take 1 tablet (81 mg total) by mouth daily. 100 tablet 3  . Blood Glucose Monitoring Suppl (PRODIGY BLOOD GLUCOSE MONITOR) DEVI As dirrected 1 each 0  . cholecalciferol (VITAMIN D) 1000 UNITS tablet Take 1 tablet (1,000 Units total) by mouth daily. 100 tablet 3  . finasteride (PROSCAR) 5 MG tablet Take 1 tablet (5 mg total) by mouth daily. For Prostate 90 tablet 3  . glimepiride (AMARYL) 2 MG tablet Take 1 tablet (2 mg total) by mouth 2 (two) times daily. For Diabetes. 180 tablet 3  . glucose blood test strip 1 each by Other route 2 (two) times daily. Use to check blood sugars twice a day Dx E11.8 300 each 3  . Linaclotide (LINZESS) 290 MCG CAPS capsule Take 1 capsule (290 mcg total) by mouth daily. 30 capsule 11  . losartan (COZAAR) 50 MG tablet TAKE ONE TABLET BY MOUTH ONCE DAILY 90 tablet 3  . lovastatin (MEVACOR) 20 MG tablet Take 1 tablet (20 mg total) by mouth daily. For Cholesterol 90 tablet 3  . metFORMIN (GLUCOPHAGE) 500 MG tablet Take 1 tablet (500 mg total) by mouth daily with breakfast. 90 tablet 3  . metoprolol succinate (TOPROL-XL) 50 MG 24 hr tablet Take 1 tablet (50 mg total) by mouth daily. For Blood Pressure 90 tablet 3  . omeprazole (PRILOSEC) 20 MG capsule Take 2 capsules (40 mg total) by mouth daily. For Stomach 180 capsule 3  . oxybutynin (DITROPAN) 5 MG tablet 1-2 po qhs for urinary frequency 60 tablet 5  . PRODIGY LANCETS 28G MISC 28 g by Other route 2 (two) times daily. Use to help check blood sugars twice a day Dx E11.8 300 each 3  . tamsulosin (FLOMAX) 0.4 MG CAPS capsule Take 1  capsule (0.4 mg total) by mouth daily. 90 capsule 3  . traMADol (ULTRAM) 50 MG tablet Take 1-2 tablets (50-100 mg total) by mouth 2 (two) times daily as needed. 60 tablet 3  . triamcinolone ointment (KENALOG) 0.5 % Apply 1 application topically 4 (four) times daily. 90 g 0  . valACYclovir (VALTREX) 1000 MG tablet Take 1 tablet (1,000 mg total) by mouth 3 (three) times daily. 21 tablet 0  . verapamil (VERELAN PM) 180 MG 24 hr capsule Take 1 capsule (180 mg total) by mouth at bedtime. 90 capsule 3  . clonazePAM (KLONOPIN) 0.25 MG disintegrating tablet Take 1 tablet (0.25 mg total) by mouth at bedtime as needed (insomnia). 30 tablet 5  . azithromycin (ZITHROMAX) 250 MG tablet Take 2 tablets first dose then 1 tablet for the next 4 days (Patient not taking: Reported on 03/09/2015) 6 each 0   No facility-administered medications prior to visit.    ROS Review of Systems  Constitutional: Negative for appetite change, fatigue and unexpected weight change.  HENT: Negative for congestion, nosebleeds, sneezing, sore throat and trouble swallowing.   Eyes: Negative for itching and visual disturbance.  Respiratory: Negative for cough.   Cardiovascular: Negative for chest pain, palpitations and leg swelling.  Gastrointestinal: Negative for nausea, diarrhea, blood in  stool and abdominal distention.  Genitourinary: Negative for frequency and hematuria.  Musculoskeletal: Negative for back pain, joint swelling, gait problem and neck pain.  Skin: Negative for rash.  Neurological: Negative for dizziness, tremors, speech difficulty and weakness.  Psychiatric/Behavioral: Positive for sleep disturbance. Negative for dysphoric mood and agitation. The patient is not nervous/anxious.     Objective:  BP 120/70 mmHg  Pulse 79  Wt 201 lb (91.173 kg)  SpO2 99%  BP Readings from Last 3 Encounters:  03/09/15 120/70  11/23/14 120/84  09/06/14 150/80    Wt Readings from Last 3 Encounters:  03/09/15 201 lb (91.173  kg)  11/23/14 198 lb (89.812 kg)  09/06/14 196 lb (88.905 kg)    Physical Exam  Lab Results  Component Value Date   WBC 8.0 08/13/2011   HGB 13.4 08/13/2011   HCT 40.5 08/13/2011   PLT 246.0 08/13/2011   GLUCOSE 89 11/23/2014   CHOL 128 04/27/2013   TRIG 87.0 04/27/2013   HDL 41.60 04/27/2013   LDLCALC 69 04/27/2013   ALT 22 04/27/2013   AST 17 04/27/2013   NA 140 11/23/2014   K 5.0 11/23/2014   CL 105 11/23/2014   CREATININE 1.91* 11/23/2014   BUN 28* 11/23/2014   CO2 28 11/23/2014   TSH 0.80 08/13/2011   PSA 8.93* 11/23/2014   HGBA1C 7.1* 11/23/2014    Mr Cervical Spine Wo Contrast  08/28/2014  CLINICAL DATA:  Acute LEFT-sided neck pain that radiates into the shoulder and causes weakness. Symptom duration for 2 months. EXAM: MRI CERVICAL SPINE WITHOUT CONTRAST TECHNIQUE: Multiplanar, multisequence MR imaging of the cervical spine was performed. No intravenous contrast was administered. COMPARISON:  MRI cervical spine 07/15/2007. FINDINGS: Alignment: Mild straightening of the normal cervical lordosis. No subluxation. Vertebrae: No worrisome osseous lesion. Cord: Multilevel cord compression questionable abnormal cord signal at C3-4, C4-5, and C5-6 related to stenosis. Posterior Fossa: No tonsillar herniation. Vertebral Arteries: Patent and equal. Paraspinal tissues: The thyroid gland may be slightly enlarged. This is incompletely evaluated on today's exam. Consider thyroid sonography if there are palpable abnormalities. The gland appears similar to priors. Disc levels: The individual disc spaces were examined as follows: C2-3: Slight bulge. Ossification of the posterior longitudinal ligament. No impingement. C3-4: Superimposed central disc protrusion along with ossification of the posterior longitudinal ligament. Moderate cord flattening. Canal diameter 5-6 mm. BILATERAL foraminal narrowing due to uncinate spurring. C4-5: Moderate disc space narrowing. Central disc osteophyte complex  with ossification of the posterior longitudinal ligament. LEFT greater than RIGHT uncinate spurring results in foraminal narrowing potentially affecting the C5 nerves. Mild cord flattening. Canal diameter 5-6 mm. C5-6: Advanced disc space narrowing. Central disc protrusion with ossification of the posterior longitudinal ligament. LEFT greater than RIGHT foraminal narrowing due to disc material and uncinate spurring. Mild cord flattening. Canal diameter 6 mm. LEFT greater than RIGHT C6 nerve root impingement. C6-7: Shallow central protrusion. Slight ossification of the posterior longitudinal ligament terminating at C6. Slight effacement anterior subarachnoid space without cord compression or foraminal narrowing. C7-T1: Central and leftward protrusion. Mild facet arthropathy. LEFT C8 nerve root impingement not excluded. Compared with priors, a similar appearance is noted. 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 Multilevel foraminal narrowing and spinal stenosis with questionable abnormal cord signal at C3-4, C4-5, and C5-6. Similar appearance to priors. Potentially symptomatic LEFT-sided neural impingement most likely at C4-5, C5-6, and C7-T1. See discussion above. Electronically Signed   By: Jenny Reichmann  Alfonse Flavors M.D.   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 -     gabapentin (NEURONTIN) 300 MG capsule; Take 1 capsule (300 mg total) by mouth 3 (three) times daily.  I have discontinued Mr. Figg's clonazePAM and azithromycin. I am also having him start on gabapentin. Additionally, I am having him  maintain his PRODIGY BLOOD GLUCOSE MONITOR, glucose blood, PRODIGY LANCETS 28G, glimepiride, cholecalciferol, losartan, aspirin, lovastatin, metoprolol succinate, omeprazole, tamsulosin, verapamil, triamcinolone ointment, valACYclovir, Linaclotide, traMADol, oxybutynin, finasteride, and metFORMIN.  Meds ordered this encounter  Medications  . gabapentin (NEURONTIN) 300 MG capsule    Sig: Take 1 capsule (300 mg total) by mouth 3 (three) times daily.    Dispense:  90 capsule    Refill:  3     Follow-up: Return in about 6 weeks (around 04/20/2015) for a follow-up visit.  Walker Kehr, MD

## 2015-03-17 ENCOUNTER — Telehealth: Payer: Self-pay | Admitting: Internal Medicine

## 2015-03-17 NOTE — Telephone Encounter (Signed)
Pt states he has been talking with you regarding him seeing people in front of him and grabbing him. He states he wakes up in the middle of the night with a very dry mouth. He takes a sip of water and sucks on a piece of candy. He wants to know if this piece of candy will affect his blood sugar any.

## 2015-03-18 NOTE — Telephone Encounter (Signed)
Pt called back. He states [phone] is a good number

## 2015-03-18 NOTE — Telephone Encounter (Signed)
Called pt- # is busy. Will try again later.

## 2015-03-21 NOTE — Telephone Encounter (Signed)
I called pt again- he states he is still having hallucinations. He wants to know if his blood sugar could be the cause. He states his CBGs are around 160.  I advised him to use sugar free candy only. I also advised him of PCP's advisement on his hallucinations (see 03/09/15 OV note) .Blindness can cause patients to have hallucinations and see things that seem very real. I scheduled his 6 wk f/u for 04/21/15 and advised him to call us sooner if he needs [place]. Pt verbalized understanding.

## 2015-04-14 DIAGNOSIS — Z951 Presence of aortocoronary bypass graft: Secondary | ICD-10-CM | POA: Diagnosis not present

## 2015-04-14 DIAGNOSIS — I119 Hypertensive heart disease without heart failure: Secondary | ICD-10-CM | POA: Diagnosis not present

## 2015-04-14 DIAGNOSIS — I2510 Atherosclerotic heart disease of native coronary artery without angina pectoris: Secondary | ICD-10-CM | POA: Diagnosis not present

## 2015-04-14 DIAGNOSIS — R079 Chest pain, unspecified: Secondary | ICD-10-CM | POA: Diagnosis not present

## 2015-04-21 ENCOUNTER — Ambulatory Visit: Payer: Commercial Managed Care - HMO | Admitting: Internal Medicine

## 2015-04-22 ENCOUNTER — Ambulatory Visit (INDEPENDENT_AMBULATORY_CARE_PROVIDER_SITE_OTHER): Payer: Commercial Managed Care - HMO | Admitting: Internal Medicine

## 2015-04-22 ENCOUNTER — Encounter: Payer: Self-pay | Admitting: Internal Medicine

## 2015-04-22 VITALS — BP 140/60 | HR 77 | Wt 204.0 lb

## 2015-04-22 DIAGNOSIS — E1139 Type 2 diabetes mellitus with other diabetic ophthalmic complication: Secondary | ICD-10-CM

## 2015-04-22 DIAGNOSIS — M792 Neuralgia and neuritis, unspecified: Secondary | ICD-10-CM | POA: Diagnosis not present

## 2015-04-22 DIAGNOSIS — R682 Dry mouth, unspecified: Secondary | ICD-10-CM | POA: Diagnosis not present

## 2015-04-22 DIAGNOSIS — H5316 Psychophysical visual disturbances: Secondary | ICD-10-CM

## 2015-04-22 NOTE — Progress Notes (Signed)
Pre visit review using our clinic review tool, if applicable. No additional management support is needed unless otherwise documented below in the visit note. 

## 2015-04-22 NOTE — Assessment & Plan Note (Signed)
Oxybutinine is making your mouth dry

## 2015-04-22 NOTE — Patient Instructions (Signed)
Oxybutinine is making your mouth dry

## 2015-04-22 NOTE — Assessment & Plan Note (Signed)
On Glimeperide

## 2015-04-22 NOTE — Assessment & Plan Note (Signed)
No relapse of his sx's

## 2015-04-22 NOTE — Assessment & Plan Note (Signed)
Pt had feelings at night when he is asleep or falling asleep: it felt like someone would hit or grab him on the L side -- this problem has resolved. Cont w/Gabapentin

## 2015-04-22 NOTE — Progress Notes (Signed)
Subjective:  Patient ID: Caleb Nim Sr., male    DOB: [birth year]  Age: 75 y.o. MRN: [id number]  CC: No chief complaint on file.   HPI [employer] presents for feelings at night when he is asleep or falling asleep: it felt like someone would hit or grab him on the L side -- this problem has resolved. F/u DM. C/o dry mouth at night  Outpatient Prescriptions Prior to Visit  Medication Sig Dispense Refill  . aspirin 81 MG tablet Take 1 tablet (81 mg total) by mouth daily. 100 tablet 3  . Blood Glucose Monitoring Suppl (PRODIGY BLOOD GLUCOSE MONITOR) DEVI As dirrected 1 each 0  . cholecalciferol (VITAMIN D) 1000 UNITS tablet Take 1 tablet (1,000 Units total) by mouth daily. 100 tablet 3  . finasteride (PROSCAR) 5 MG tablet Take 1 tablet (5 mg total) by mouth daily. For Prostate 90 tablet 3  . gabapentin (NEURONTIN) 300 MG capsule Take 1 capsule (300 mg total) by mouth 3 (three) times daily. 90 capsule 3  . glimepiride (AMARYL) 2 MG tablet Take 1 tablet (2 mg total) by mouth 2 (two) times daily. For Diabetes. 180 tablet 3  . glucose blood test strip 1 each by Other route 2 (two) times daily. Use to check blood sugars twice a day Dx E11.8 300 each 3  . Linaclotide (LINZESS) 290 MCG CAPS capsule Take 1 capsule (290 mcg total) by mouth daily. 30 capsule 11  . losartan (COZAAR) 50 MG tablet TAKE ONE TABLET BY MOUTH ONCE DAILY 90 tablet 3  . lovastatin (MEVACOR) 20 MG tablet Take 1 tablet (20 mg total) by mouth daily. For Cholesterol 90 tablet 3  . metFORMIN (GLUCOPHAGE) 500 MG tablet Take 1 tablet (500 mg total) by mouth daily with breakfast. 90 tablet 3  . metoprolol succinate (TOPROL-XL) 50 MG 24 hr tablet Take 1 tablet (50 mg total) by mouth daily. For Blood Pressure 90 tablet 3  . omeprazole (PRILOSEC) 20 MG capsule Take 2 capsules (40 mg total) by mouth daily. For Stomach 180 capsule 3  . oxybutynin (DITROPAN) 5 MG tablet 1-2 po qhs for urinary frequency 60 tablet 5  . PRODIGY  LANCETS 28G MISC 28 g by Other route 2 (two) times daily. Use to help check blood sugars twice a day Dx E11.8 300 each 3  . tamsulosin (FLOMAX) 0.4 MG CAPS capsule Take 1 capsule (0.4 mg total) by mouth daily. 90 capsule 3  . traMADol (ULTRAM) 50 MG tablet Take 1-2 tablets (50-100 mg total) by mouth 2 (two) times daily as needed. 60 tablet 3  . triamcinolone ointment (KENALOG) 0.5 % Apply 1 application topically 4 (four) times daily. 90 g 0  . valACYclovir (VALTREX) 1000 MG tablet Take 1 tablet (1,000 mg total) by mouth 3 (three) times daily. 21 tablet 0  . verapamil (VERELAN PM) 180 MG 24 hr capsule Take 1 capsule (180 mg total) by mouth at bedtime. 90 capsule 3   No facility-administered medications prior to visit.    ROS Review of Systems  Constitutional: Negative for appetite change, fatigue and unexpected weight change.  HENT: Negative for congestion, nosebleeds, sneezing, sore throat and trouble swallowing.   Eyes: Positive for visual disturbance. Negative for itching.  Respiratory: Negative for cough.   Cardiovascular: Negative for chest pain, palpitations and leg swelling.  Gastrointestinal: Negative for nausea, diarrhea, blood in stool and abdominal distention.  Genitourinary: Negative for frequency and hematuria.  Musculoskeletal: Positive for back pain and arthralgias. Negative  for joint swelling, gait problem and neck pain.  Skin: Negative for rash.  Neurological: Negative for dizziness, tremors, speech difficulty and weakness.  Psychiatric/Behavioral: Negative for sleep disturbance, dysphoric mood and agitation. The patient is not nervous/anxious.     Objective:  BP 140/60 mmHg  Pulse 77  Wt 204 lb (92.534 kg)  SpO2 97%  BP Readings from Last 3 Encounters:  04/22/15 140/60  03/09/15 120/70  11/23/14 120/84    Wt Readings from Last 3 Encounters:  04/22/15 204 lb (92.534 kg)  03/09/15 201 lb (91.173 kg)  11/23/14 198 lb (89.812 kg)    Physical Exam    Constitutional: He is oriented to person, place, and time. He appears well-developed. No distress.  NAD  HENT:  Mouth/Throat: Oropharynx is clear and moist.  Eyes: Conjunctivae are normal. Pupils are equal, round, and reactive to light.  blind  Neck: Normal range of motion. No JVD present. No thyromegaly present.  Cardiovascular: Normal rate, regular rhythm, normal heart sounds and intact distal pulses.  Exam reveals no gallop and no friction rub.   No murmur heard. Pulmonary/Chest: Effort normal and breath sounds normal. No respiratory distress. He has no wheezes. He has no rales. He exhibits no tenderness.  Abdominal: Soft. Bowel sounds are normal. He exhibits no distension and no mass. There is no tenderness. There is no rebound and no guarding.  Musculoskeletal: Normal range of motion. He exhibits no edema or tenderness.  Lymphadenopathy:    He has no cervical adenopathy.  Neurological: He is alert and oriented to person, place, and time. He has normal reflexes. No cranial nerve deficit. He exhibits normal muscle tone. He displays a negative Romberg sign. Coordination and gait normal.  Skin: Skin is warm and dry. No rash noted.  Psychiatric: He has a normal mood and affect. His behavior is normal. Judgment and thought content normal.  dark glasses Cane  Lab Results  Component Value Date   WBC 8.0 08/13/2011   HGB 13.4 08/13/2011   HCT 40.5 08/13/2011   PLT 246.0 08/13/2011   GLUCOSE 89 11/23/2014   CHOL 128 04/27/2013   TRIG 87.0 04/27/2013   HDL 41.60 04/27/2013   LDLCALC 69 04/27/2013   ALT 22 04/27/2013   AST 17 04/27/2013   NA 140 11/23/2014   K 5.0 11/23/2014   CL 105 11/23/2014   CREATININE 1.91* 11/23/2014   BUN 28* 11/23/2014   CO2 28 11/23/2014   TSH 0.80 08/13/2011   PSA 8.93* 11/23/2014   HGBA1C 7.1* 11/23/2014    Mr Cervical Spine Wo Contrast  08/28/2014  CLINICAL DATA:  Acute LEFT-sided neck pain that radiates into the shoulder and causes weakness.  Symptom duration for 2 months. EXAM: MRI CERVICAL SPINE WITHOUT CONTRAST TECHNIQUE: Multiplanar, multisequence MR imaging of the cervical spine was performed. No intravenous contrast was administered. COMPARISON:  MRI cervical spine 07/15/2007. FINDINGS: Alignment: Mild straightening of the normal cervical lordosis. No subluxation. Vertebrae: No worrisome osseous lesion. Cord: Multilevel cord compression questionable abnormal cord signal at C3-4, C4-5, and C5-6 related to stenosis. Posterior Fossa: No tonsillar herniation. Vertebral Arteries: Patent and equal. Paraspinal tissues: The thyroid gland may be slightly enlarged. This is incompletely evaluated on today's exam. Consider thyroid sonography if there are palpable abnormalities. The gland appears similar to priors. Disc levels: The individual disc spaces were examined as follows: C2-3: Slight bulge. Ossification of the posterior longitudinal ligament. No impingement. C3-4: Superimposed central disc protrusion along with ossification of the posterior longitudinal ligament. Moderate cord  flattening. Canal diameter 5-6 mm. BILATERAL foraminal narrowing due to uncinate spurring. C4-5: Moderate disc space narrowing. Central disc osteophyte complex with ossification of the posterior longitudinal ligament. LEFT greater than RIGHT uncinate spurring results in foraminal narrowing potentially affecting the C5 nerves. Mild cord flattening. Canal diameter 5-6 mm. C5-6: Advanced disc space narrowing. Central disc protrusion with ossification of the posterior longitudinal ligament. LEFT greater than RIGHT foraminal narrowing due to disc material and uncinate spurring. Mild cord flattening. Canal diameter 6 mm. LEFT greater than RIGHT C6 nerve root impingement. C6-7: Shallow central protrusion. Slight ossification of the posterior longitudinal ligament terminating at C6. Slight effacement anterior subarachnoid space without cord compression or foraminal narrowing. C7-T1:  Central and leftward protrusion. Mild facet arthropathy. LEFT C8 nerve root impingement not excluded. Compared with priors, a similar appearance is noted. 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 Multilevel foraminal narrowing and spinal stenosis with questionable abnormal cord signal at C3-4, C4-5, and C5-6. Similar appearance to priors. Potentially symptomatic LEFT-sided neural impingement most likely at C4-5, C5-6, and C7-T1. See discussion above. Electronically Signed   By: Staci Righter M.D.   On: 08/28/2014 10:34    Assessment & Plan:   There are no diagnoses linked to this encounter. I am having Mr. Petsch maintain his PRODIGY BLOOD GLUCOSE MONITOR, glucose blood, PRODIGY LANCETS 28G, glimepiride, cholecalciferol, losartan, aspirin, lovastatin, metoprolol succinate, omeprazole, tamsulosin, verapamil, triamcinolone ointment, valACYclovir, Linaclotide, traMADol, oxybutynin, finasteride, metFORMIN, and gabapentin.  No orders of the defined types were placed in this encounter.     Follow-up: No Follow-up on file.  Walker Kehr, MD

## 2015-05-09 ENCOUNTER — Other Ambulatory Visit: Payer: Self-pay | Admitting: Internal Medicine

## 2015-05-12 ENCOUNTER — Telehealth: Payer: Self-pay | Admitting: Internal Medicine

## 2015-05-12 NOTE — Telephone Encounter (Signed)
I called pt's son- I informed him gabapentin is for neuropathy. However, it looks like PCP d/c'd this medication at 02/2015 OV.  Pt's son thanked me and hung up.

## 2015-05-12 NOTE — Telephone Encounter (Signed)
Pt son called to inquire about what medication gabapentin (NEURONTIN) 300 MG capsule is used for? Call son @ [phone]. Thank you!

## 2015-05-14 ENCOUNTER — Other Ambulatory Visit: Payer: Self-pay | Admitting: Internal Medicine

## 2015-06-13 ENCOUNTER — Other Ambulatory Visit (INDEPENDENT_AMBULATORY_CARE_PROVIDER_SITE_OTHER): Payer: Commercial Managed Care - HMO

## 2015-06-13 DIAGNOSIS — I119 Hypertensive heart disease without heart failure: Secondary | ICD-10-CM

## 2015-06-13 DIAGNOSIS — E1139 Type 2 diabetes mellitus with other diabetic ophthalmic complication: Secondary | ICD-10-CM

## 2015-06-13 LAB — BASIC METABOLIC PANEL
BUN: 27 mg/dL — ABNORMAL HIGH (ref 6–23)
CHLORIDE: 104 meq/L (ref 96–112)
CO2: 24 meq/L (ref 19–32)
Calcium: 9.4 mg/dL (ref 8.4–10.5)
Creatinine, Ser: 1.9 mg/dL — ABNORMAL HIGH (ref 0.40–1.50)
GFR: 44.58 mL/min — ABNORMAL LOW (ref >60.00)
Glucose, Bld: 192 mg/dL — ABNORMAL HIGH (ref 70–99)
POTASSIUM: 4.5 meq/L (ref 3.5–5.1)
Sodium: 135 mEq/L (ref 135–145)

## 2015-06-13 LAB — HEMOGLOBIN A1C: HEMOGLOBIN A1C: 7.4 % — AB (ref 4.6–6.5)

## 2015-06-17 ENCOUNTER — Encounter: Payer: Self-pay | Admitting: Internal Medicine

## 2015-06-17 ENCOUNTER — Ambulatory Visit (INDEPENDENT_AMBULATORY_CARE_PROVIDER_SITE_OTHER): Payer: Commercial Managed Care - HMO | Admitting: Internal Medicine

## 2015-06-17 VITALS — BP 118/68 | HR 68 | Wt 202.0 lb

## 2015-06-17 DIAGNOSIS — F4323 Adjustment disorder with mixed anxiety and depressed mood: Secondary | ICD-10-CM | POA: Diagnosis not present

## 2015-06-17 DIAGNOSIS — I25110 Atherosclerotic heart disease of native coronary artery with unstable angina pectoris: Secondary | ICD-10-CM | POA: Diagnosis not present

## 2015-06-17 DIAGNOSIS — I119 Hypertensive heart disease without heart failure: Secondary | ICD-10-CM

## 2015-06-17 DIAGNOSIS — R972 Elevated prostate specific antigen [PSA]: Secondary | ICD-10-CM | POA: Diagnosis not present

## 2015-06-17 DIAGNOSIS — E1139 Type 2 diabetes mellitus with other diabetic ophthalmic complication: Secondary | ICD-10-CM

## 2015-06-17 DIAGNOSIS — H5316 Psychophysical visual disturbances: Secondary | ICD-10-CM

## 2015-06-17 MED ORDER — FLUOXETINE HCL 10 MG PO TABS: 10.0000 mg | ORAL_TABLET | Freq: Every day | ORAL | Status: DC

## 2015-06-17 NOTE — Assessment & Plan Note (Signed)
Lovastatin, ASA 

## 2015-06-17 NOTE — Progress Notes (Signed)
Subjective:  Patient ID: Caleb Nim Sr., male    DOB: [birth year]  Age: 75 y.o. MRN: [id number]  CC: No chief complaint on file.   HPI [name] presents for CAD, DM, HTN f/u - c/o depression: "tired of living, tired of being blind"  Outpatient Prescriptions Prior to Visit  Medication Sig Dispense Refill  . aspirin 81 MG tablet Take 1 tablet (81 mg total) by mouth daily. 100 tablet 3  . Blood Glucose Monitoring Suppl (PRODIGY BLOOD GLUCOSE MONITOR) DEVI As dirrected 1 each 0  . cholecalciferol (VITAMIN D) 1000 UNITS tablet Take 1 tablet (1,000 Units total) by mouth daily. 100 tablet 3  . finasteride (PROSCAR) 5 MG tablet Take 1 tablet (5 mg total) by mouth daily. For Prostate 90 tablet 3  . gabapentin (NEURONTIN) 300 MG capsule TAKE 1 CAPSULE THREE TIMES DAILY 270 capsule 3  . glimepiride (AMARYL) 2 MG tablet TAKE 1 TABLET TWICE DAILY FOR DIABETES 180 tablet 3  . glucose blood test strip 1 each by Other route 2 (two) times daily. Use to check blood sugars twice a day Dx E11.8 300 each 3  . Linaclotide (LINZESS) 290 MCG CAPS capsule Take 1 capsule (290 mcg total) by mouth daily. 30 capsule 11  . losartan (COZAAR) 50 MG tablet TAKE ONE TABLET BY MOUTH ONCE DAILY 90 tablet 3  . lovastatin (MEVACOR) 20 MG tablet Take 1 tablet (20 mg total) by mouth daily. For Cholesterol 90 tablet 3  . metFORMIN (GLUCOPHAGE) 500 MG tablet Take 1 tablet (500 mg total) by mouth daily with breakfast. 90 tablet 3  . metoprolol succinate (TOPROL-XL) 50 MG 24 hr tablet Take 1 tablet (50 mg total) by mouth daily. For Blood Pressure 90 tablet 3  . omeprazole (PRILOSEC) 20 MG capsule Take 2 capsules (40 mg total) by mouth daily. For Stomach 180 capsule 3  . oxybutynin (DITROPAN) 5 MG tablet 1-2 po qhs for urinary frequency 60 tablet 5  . PRODIGY LANCETS 28G MISC 28 g by Other route 2 (two) times daily. Use to help check blood sugars twice a day Dx E11.8 300 each 3  . tamsulosin (FLOMAX) 0.4 MG CAPS  capsule Take 1 capsule (0.4 mg total) by mouth daily. 90 capsule 3  . traMADol (ULTRAM) 50 MG tablet Take 1-2 tablets (50-100 mg total) by mouth 2 (two) times daily as needed. 60 tablet 3  . triamcinolone ointment (KENALOG) 0.5 % Apply 1 application topically 4 (four) times daily. 90 g 0  . valACYclovir (VALTREX) 1000 MG tablet Take 1 tablet (1,000 mg total) by mouth 3 (three) times daily. 21 tablet 0  . verapamil (VERELAN PM) 180 MG 24 hr capsule Take 1 capsule (180 mg total) by mouth at bedtime. 90 capsule 3   No facility-administered medications prior to visit.    ROS Review of Systems  Constitutional: Negative for appetite change, fatigue and unexpected weight change.  HENT: Negative for congestion, nosebleeds, sneezing, sore throat and trouble swallowing.   Eyes: Positive for visual disturbance. Negative for itching.  Respiratory: Negative for cough.   Cardiovascular: Negative for chest pain, palpitations and leg swelling.  Gastrointestinal: Negative for nausea, diarrhea, blood in stool and abdominal distention.  Genitourinary: Negative for frequency and hematuria.  Musculoskeletal: Positive for gait problem. Negative for back pain, joint swelling and neck pain.  Skin: Negative for rash.  Neurological: Negative for dizziness, tremors, speech difficulty and weakness.  Psychiatric/Behavioral: Positive for dysphoric mood. Negative for suicidal ideas, sleep disturbance and  agitation. The patient is not nervous/anxious.     Objective:  BP 118/68 mmHg  Pulse 68  Wt 202 lb (91.627 kg)  SpO2 99%  BP Readings from Last 3 Encounters:  06/17/15 118/68  04/22/15 140/60  03/09/15 120/70    Wt Readings from Last 3 Encounters:  06/17/15 202 lb (91.627 kg)  04/22/15 204 lb (92.534 kg)  03/09/15 201 lb (91.173 kg)    Physical Exam  Constitutional: He is oriented to person, place, and time. He appears well-developed. No distress.  NAD  HENT:  Mouth/Throat: Oropharynx is clear and  moist.  Eyes: Conjunctivae are normal.  Neck: Normal range of motion. No JVD present. No thyromegaly present.  Cardiovascular: Normal rate, regular rhythm, normal heart sounds and intact distal pulses.  Exam reveals no gallop and no friction rub.   No murmur heard. Pulmonary/Chest: Effort normal and breath sounds normal. No respiratory distress. He has no wheezes. He has no rales. He exhibits no tenderness.  Abdominal: Soft. Bowel sounds are normal. He exhibits no distension and no mass. There is no tenderness. There is no rebound and no guarding.  Musculoskeletal: Normal range of motion. He exhibits no edema or tenderness.  Lymphadenopathy:    He has no cervical adenopathy.  Neurological: He is alert and oriented to person, place, and time. He has normal reflexes. No cranial nerve deficit. He exhibits normal muscle tone. He displays a negative Romberg sign. Coordination abnormal. Gait normal.  Skin: Skin is warm and dry. No rash noted.  Psychiatric: His behavior is normal. Judgment and thought content normal.  blind Cane   Lab Results  Component Value Date   WBC 8.0 08/13/2011   HGB 13.4 08/13/2011   HCT 40.5 08/13/2011   PLT 246.0 08/13/2011   GLUCOSE 192* 06/13/2015   CHOL 128 04/27/2013   TRIG 87.0 04/27/2013   HDL 41.60 04/27/2013   LDLCALC 69 04/27/2013   ALT 22 04/27/2013   AST 17 04/27/2013   NA 135 06/13/2015   K 4.5 06/13/2015   CL 104 06/13/2015   CREATININE 1.90* 06/13/2015   BUN 27* 06/13/2015   CO2 24 06/13/2015   TSH 0.80 08/13/2011   PSA 8.93* 11/23/2014   HGBA1C 7.4* 06/13/2015    Mr Cervical Spine Wo Contrast  08/28/2014  CLINICAL DATA:  Acute LEFT-sided neck pain that radiates into the shoulder and causes weakness. Symptom duration for 2 months. EXAM: MRI CERVICAL SPINE WITHOUT CONTRAST TECHNIQUE: Multiplanar, multisequence MR imaging of the cervical spine was performed. No intravenous contrast was administered. COMPARISON:  MRI cervical spine 07/15/2007.  FINDINGS: Alignment: Mild straightening of the normal cervical lordosis. No subluxation. Vertebrae: No worrisome osseous lesion. Cord: Multilevel cord compression questionable abnormal cord signal at C3-4, C4-5, and C5-6 related to stenosis. Posterior Fossa: No tonsillar herniation. Vertebral Arteries: Patent and equal. Paraspinal tissues: The thyroid gland may be slightly enlarged. This is incompletely evaluated on today's exam. Consider thyroid sonography if there are palpable abnormalities. The gland appears similar to priors. Disc levels: The individual disc spaces were examined as follows: C2-3: Slight bulge. Ossification of the posterior longitudinal ligament. No impingement. C3-4: Superimposed central disc protrusion along with ossification of the posterior longitudinal ligament. Moderate cord flattening. Canal diameter 5-6 mm. BILATERAL foraminal narrowing due to uncinate spurring. C4-5: Moderate disc space narrowing. Central disc osteophyte complex with ossification of the posterior longitudinal ligament. LEFT greater than RIGHT uncinate spurring results in foraminal narrowing potentially affecting the C5 nerves. Mild cord flattening. Canal diameter 5-6 mm.  C5-6: Advanced disc space narrowing. Central disc protrusion with ossification of the posterior longitudinal ligament. LEFT greater than RIGHT foraminal narrowing due to disc material and uncinate spurring. Mild cord flattening. Canal diameter 6 mm. LEFT greater than RIGHT C6 nerve root impingement. C6-7: Shallow central protrusion. Slight ossification of the posterior longitudinal ligament terminating at C6. Slight effacement anterior subarachnoid space without cord compression or foraminal narrowing. C7-T1: Central and leftward protrusion. Mild facet arthropathy. LEFT C8 nerve root impingement not excluded. Compared with priors, a similar appearance is noted. IMPRESSION: Multilevel spondylosis is compounded by diffuse ossification of the posterior  longitudinal ligament from C2 through C6. Multilevel foraminal narrowing and spinal stenosis with questionable abnormal cord signal at C3-4, C4-5, and C5-6. Similar appearance to priors. Potentially symptomatic LEFT-sided neural impingement most likely at C4-5, C5-6, and C7-T1. See discussion above. Electronically Signed   By: Staci Righter M.D.   On: 08/28/2014 10:34    Assessment & Plan:   There are no diagnoses linked to this encounter. I am having Mr. Rister maintain his PRODIGY BLOOD GLUCOSE MONITOR, glucose blood, PRODIGY LANCETS 28G, cholecalciferol, losartan, aspirin, lovastatin, metoprolol succinate, omeprazole, tamsulosin, verapamil, triamcinolone ointment, valACYclovir, linaclotide, traMADol, oxybutynin, finasteride, metFORMIN, gabapentin, and glimepiride.  No orders of the defined types were placed in this encounter.     Follow-up: No Follow-up on file.  Walker Kehr, MD

## 2015-06-17 NOTE — Assessment & Plan Note (Signed)
Toprol, Verapamil

## 2015-06-17 NOTE — Assessment & Plan Note (Signed)
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

## 2015-06-17 NOTE — Assessment & Plan Note (Signed)
Recurrent sx's 

## 2015-06-17 NOTE — Progress Notes (Signed)
Pre visit review using our clinic review tool, if applicable. No additional management support is needed unless otherwise documented below in the visit note. 

## 2015-06-17 NOTE — Assessment & Plan Note (Signed)
Monitoring PSA 

## 2015-06-17 NOTE — Assessment & Plan Note (Signed)
On Glimeperide

## 2015-06-23 ENCOUNTER — Telehealth: Payer: Self-pay | Admitting: Internal Medicine

## 2015-06-23 MED ORDER — FLUOXETINE HCL 10 MG PO TABS: 10.0000 mg | ORAL_TABLET | Freq: Every day | ORAL | Status: DC

## 2015-06-23 NOTE — Telephone Encounter (Signed)
Pt called in and has some questions about his FLUoxetine (PROZAC) 10 MG tablet [id number]

## 2015-06-23 NOTE — Telephone Encounter (Signed)
I called pt- he states the new Rx PCP prescribed/sent to [facility] mail order was $ 40.00. He states he does not usually have to pay for his meds. He is requesting this med be sent to [facility] on [address]. Done, see meds.

## 2015-08-03 DIAGNOSIS — H401133 Primary open-angle glaucoma, bilateral, severe stage: Secondary | ICD-10-CM | POA: Diagnosis not present

## 2015-08-03 DIAGNOSIS — H548 Legal blindness, as defined in USA: Secondary | ICD-10-CM | POA: Diagnosis not present

## 2015-08-03 DIAGNOSIS — E113513 Type 2 diabetes mellitus with proliferative diabetic retinopathy with macular edema, bilateral: Secondary | ICD-10-CM | POA: Diagnosis not present

## 2015-08-03 DIAGNOSIS — H5316 Psychophysical visual disturbances: Secondary | ICD-10-CM | POA: Diagnosis not present

## 2015-08-09 ENCOUNTER — Telehealth: Payer: Self-pay | Admitting: Emergency Medicine

## 2015-08-09 NOTE — Telephone Encounter (Signed)
Pt called and asked for prescriptions for diabetes medicine and blood pressure medicines to be called into [address]. [employer] is charging too much. Please follow up thanks.

## 2015-08-10 MED ORDER — VERAPAMIL HCL ER 180 MG PO CP24: 180.0000 mg | ORAL_CAPSULE | Freq: Every day | ORAL | Status: DC

## 2015-08-10 MED ORDER — LOSARTAN POTASSIUM 50 MG PO TABS: 50.0000 mg | ORAL_TABLET | Freq: Every day | ORAL | Status: DC

## 2015-08-10 MED ORDER — METOPROLOL SUCCINATE ER 50 MG PO TB24: 50.0000 mg | ORAL_TABLET | Freq: Every day | ORAL | Status: DC

## 2015-08-10 MED ORDER — METFORMIN HCL 500 MG PO TABS: 500.0000 mg | ORAL_TABLET | Freq: Every day | ORAL | Status: DC

## 2015-08-10 MED ORDER — GLIMEPIRIDE 2 MG PO TABS: ORAL_TABLET | ORAL | Status: DC

## 2015-08-10 NOTE — Telephone Encounter (Signed)
Sent medications to [facility]...Johny Chess

## 2015-08-22 ENCOUNTER — Other Ambulatory Visit: Payer: Self-pay | Admitting: *Deleted

## 2015-08-22 MED ORDER — METFORMIN HCL 500 MG PO TABS: 500.0000 mg | ORAL_TABLET | Freq: Every day | ORAL | 2 refills | Status: DC

## 2015-08-22 MED ORDER — LOSARTAN POTASSIUM 50 MG PO TABS: 50.0000 mg | ORAL_TABLET | Freq: Every day | ORAL | 2 refills | Status: DC

## 2015-08-22 MED ORDER — METOPROLOL SUCCINATE ER 50 MG PO TB24: 50.0000 mg | ORAL_TABLET | Freq: Every day | ORAL | 2 refills | Status: DC

## 2015-08-22 MED ORDER — GLIMEPIRIDE 2 MG PO TABS: ORAL_TABLET | ORAL | 2 refills | Status: DC

## 2015-08-22 MED ORDER — VERAPAMIL HCL ER 180 MG PO CP24: 180.0000 mg | ORAL_CAPSULE | Freq: Every day | ORAL | 2 refills | Status: DC

## 2015-08-22 NOTE — Telephone Encounter (Signed)
Rec'd call pt states [employer] is no longer mailing his medications, and he is out of hei BP & diabetes medications. Requesting prescriptions to be sent to [email or web address]. Inform pt will send electronically...Caleb Dunn

## 2015-08-23 ENCOUNTER — Telehealth: Payer: Self-pay | Admitting: Internal Medicine

## 2015-08-23 NOTE — Telephone Encounter (Signed)
Daughter states that one of his medications he thought that was for blood pressure was Verelan.  Patient is currently out of this medication and needs refill and clarification on what it is for.

## 2015-08-23 NOTE — Telephone Encounter (Signed)
Daughter called in stating dad was confused on what medication he is to be taking.  Patient called in stating he needed blood pressure medication.  Does not know the name of the medication but states the wrong medication was sent to the pharmacy.  Please follow up with patient as to what he is suppose to be taking and what he needs to pick up at the pharmacy.

## 2015-08-24 NOTE — Telephone Encounter (Signed)
Dr. Alain Marion the patient is complaining when he takes the verapamil at bedtime that he is hallucinating and seeing things. Since he have been out of the medication for about 2-3 days he states that he has not been hallucinating. Wanting to know does med cause hallucination if so he wanting MD recommendation on what he need to do...Johny Chess

## 2015-08-24 NOTE — Telephone Encounter (Signed)
Called pt to verify msg's below. Pt states he is confuse about what he need to be taking. Went over medications with pt, and inform that when he called on Friday (7/31) that we sent 5 rx's to [place]. He stated that whn his daughter went to pick rx up they only had two & since he had some at home she did not get. Inform pt I will call [facility] to see what is actually going on. [facility] spoke w/Alice she stated that they did received all 5 scripts. On his glipizide & metformin it was too soon to fill he will be able to get on 10/23/15. The other 3 Losartan, Verapamil, and Metoprolol he can get those 3, but because [place] in the [place] his copay for all three in $91.70. Inform Danton Clap will call back to let them know which one to fill after I speak w/pt & MD since he is having some side effect from the verapamil. 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

## 2015-08-25 NOTE — Telephone Encounter (Signed)
Stop Verapamil Thx

## 2015-08-25 NOTE — Telephone Encounter (Signed)
Notified pt w/MD response.../lmb 

## 2015-08-30 ENCOUNTER — Ambulatory Visit: Payer: Commercial Managed Care - HMO | Admitting: Internal Medicine

## 2015-09-07 ENCOUNTER — Other Ambulatory Visit: Payer: Self-pay | Admitting: *Deleted

## 2015-09-07 MED ORDER — LOVASTATIN 20 MG PO TABS: 20.0000 mg | ORAL_TABLET | Freq: Every day | ORAL | 3 refills | Status: DC

## 2015-09-07 NOTE — Telephone Encounter (Signed)
Daughter call stating dad is needing refill on his cholesterol medicine. Verified what he is taking inform daughter will send Lovastatin to [facility]...Caleb Dunn

## 2015-09-21 ENCOUNTER — Ambulatory Visit: Payer: Commercial Managed Care - HMO | Admitting: Internal Medicine

## 2015-09-22 ENCOUNTER — Encounter: Payer: Self-pay | Admitting: Internal Medicine

## 2015-09-22 ENCOUNTER — Ambulatory Visit (INDEPENDENT_AMBULATORY_CARE_PROVIDER_SITE_OTHER): Payer: Commercial Managed Care - HMO | Admitting: Internal Medicine

## 2015-09-22 DIAGNOSIS — J028 Acute pharyngitis due to other specified organisms: Principal | ICD-10-CM

## 2015-09-22 DIAGNOSIS — B9789 Other viral agents as the cause of diseases classified elsewhere: Secondary | ICD-10-CM

## 2015-09-22 DIAGNOSIS — J029 Acute pharyngitis, unspecified: Secondary | ICD-10-CM

## 2015-09-22 MED ORDER — LEVOCETIRIZINE DIHYDROCHLORIDE 5 MG PO TABS: 5.0000 mg | ORAL_TABLET | Freq: Every evening | ORAL | 3 refills | Status: DC

## 2015-09-22 NOTE — Progress Notes (Signed)
Pre visit review using our clinic review tool, if applicable. No additional management support is needed unless otherwise documented below in the visit note. 

## 2015-09-22 NOTE — Patient Instructions (Signed)
We have sent in xyzal which is an allergy medicine for the throat.   Take 1 pill daily and it should work in 3-4 days.

## 2015-09-22 NOTE — Progress Notes (Signed)
   Subjective:    Patient ID: Caleb Nim Sr., male    DOB: [birth year], 75 y.o.   MRN: [id number]  HPI The patient is a 75 YO man coming in for sore throat for about 1 month. He is having some allergies as well. He is blind and does not know the names of his medications. He does know that none have changed lately. He gets hoarse with talking. Some sore throat. Mild nasal drainage and congestion. Denies fevers or chills. Not smoking and has not for a long time. 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   Review of Systems  Constitutional: Negative for activity change, appetite change, fatigue, fever and unexpected weight change.  HENT: Positive for postnasal drip, rhinorrhea, sore throat and voice change. Negative for congestion, ear discharge, ear pain, sinus pressure and trouble swallowing.   Eyes: Negative.   Respiratory: Negative for cough, chest tightness and shortness of breath.   Cardiovascular: Negative.   Gastrointestinal: Negative.       Objective:   Physical Exam  Constitutional: He appears well-developed and well-nourished.  HENT:  Head: Normocephalic and atraumatic.  Oropharynx red with clear drainage, no nasal crusting.   Eyes: EOM are normal.  Neck: Normal range of motion.  Cardiovascular: Normal rate and regular rhythm.   Pulmonary/Chest: Effort normal and breath sounds normal. No respiratory distress. He has no wheezes. He has no rales.  Abdominal: Soft. He exhibits no distension. There is no tenderness. There is no rebound.  Skin: Skin is warm and dry.   Vitals:   09/22/15 1536 09/22/15 1552  BP: (!) 180/90 (!) 148/82  Pulse: 92   Resp: 18   Temp: 97.7 F (36.5 C)   TempSrc: Oral   SpO2: 99%   Weight: 203 lb (92.1 kg)   Height: 5\' 6"  (1.676 m)       Assessment & Plan:

## 2015-09-23 DIAGNOSIS — J029 Acute pharyngitis, unspecified: Secondary | ICD-10-CM | POA: Insufficient documentation

## 2015-09-23 NOTE — Assessment & Plan Note (Addendum)
Suspect allergic rhinitis causing the throat pain. Advised to stop as many cough drops given his diabetes. Okay to use xyzal (rx sent in) for the allergy symptoms. Not on ACE-I. Lungs clear on exam. No indication for any steroids or antibiotics. Reminded him that he is not allowed to use any OTC medications with his blood pressure and kidney function and he knows.

## 2015-09-28 ENCOUNTER — Telehealth: Payer: Self-pay | Admitting: Internal Medicine

## 2015-09-28 NOTE — Telephone Encounter (Signed)
I would like to ask the patient to stop taking xyzal until I can talk with dr crawford tomorrow----this allergy med can cause drowsiness---patient is feeling better and allergy symptoms have improved---routing to dr Ronnald Ramp, please advise if you think it is ok to ask patient to stop xyzal until I can talk with dr crawford tomorrow (dr crawford gone for the day today)---for what other recommendations should I give patient?---thanks

## 2015-09-28 NOTE — Telephone Encounter (Signed)
Patient Name: Caleb Dunn  DOB: 30-Jun-1940    Initial Comment Caller states c/o increased drowsiness since starting new allergy medication. He can't remember the name of the medication.    Nurse Assessment  Nurse: Raphael Gibney, RN, Vera Date/Time (Eastern Time): 09/28/2015 1:47:23 PM  Confirm and document reason for call. If symptomatic, describe symptoms. You must click the next button to save text entered. ---Caller states Dr. Sharlet Salina started him on a new allergy medication and he has been taking it about 3 days. he thinks the medication is making him sleepy. Does not feel bad. Allergies seem to be getting better. Can not remember the name of the medication.  Has the patient traveled out of the country within the last 30 days? ---Not Applicable  Does the patient have any new or worsening symptoms? ---Yes  Will a triage be completed? ---Yes  Related visit to physician within the last 2 weeks? ---Yes  Does the PT have any chronic conditions? (i.e. diabetes, asthma, etc.) ---Yes  List chronic conditions. ---diabetes; HTN; heart problems  Is this a behavioral health or substance abuse call? ---No     Guidelines    Guideline Title Affirmed Question Affirmed Notes  Nasal Allergies (Hay Fever) [1] Nasal allergies AND [id number] only certain times of year (hay fever) (all triage questions negative)    Final Disposition User   [place], RN, Vanita Ingles    Comments  Pt states he thinks the new allergy medication is making him sleepy. He sleeps during the day and is sleeping all night. His allergies are better. Wants to know about continuing his medication.   Disagree/Comply: Comply

## 2015-09-30 ENCOUNTER — Other Ambulatory Visit (INDEPENDENT_AMBULATORY_CARE_PROVIDER_SITE_OTHER): Payer: Commercial Managed Care - HMO

## 2015-09-30 DIAGNOSIS — I119 Hypertensive heart disease without heart failure: Secondary | ICD-10-CM | POA: Diagnosis not present

## 2015-09-30 DIAGNOSIS — E1139 Type 2 diabetes mellitus with other diabetic ophthalmic complication: Secondary | ICD-10-CM | POA: Diagnosis not present

## 2015-09-30 LAB — BASIC METABOLIC PANEL
BUN: 24 mg/dL — ABNORMAL HIGH (ref 6–23)
CALCIUM: 9.2 mg/dL (ref 8.4–10.5)
CO2: 27 mEq/L (ref 19–32)
Chloride: 104 mEq/L (ref 96–112)
Creatinine, Ser: 2.1 mg/dL — ABNORMAL HIGH (ref 0.40–1.50)
GFR: 39.68 mL/min — AB (ref >60.00)
Glucose, Bld: 174 mg/dL — ABNORMAL HIGH (ref 70–99)
POTASSIUM: 4.4 meq/L (ref 3.5–5.1)
SODIUM: 138 meq/L (ref 135–145)

## 2015-09-30 LAB — HEMOGLOBIN A1C: HEMOGLOBIN A1C: 7.9 % — AB (ref 4.6–6.5)

## 2015-10-03 ENCOUNTER — Encounter: Payer: Self-pay | Admitting: Internal Medicine

## 2015-10-03 ENCOUNTER — Ambulatory Visit (INDEPENDENT_AMBULATORY_CARE_PROVIDER_SITE_OTHER): Payer: Commercial Managed Care - HMO | Admitting: Internal Medicine

## 2015-10-03 VITALS — BP 140/80 | HR 81 | Temp 97.7°F | Wt 205.0 lb

## 2015-10-03 DIAGNOSIS — Z23 Encounter for immunization: Secondary | ICD-10-CM | POA: Diagnosis not present

## 2015-10-03 DIAGNOSIS — H6122 Impacted cerumen, left ear: Secondary | ICD-10-CM | POA: Insufficient documentation

## 2015-10-03 DIAGNOSIS — N184 Chronic kidney disease, stage 4 (severe): Secondary | ICD-10-CM | POA: Diagnosis not present

## 2015-10-03 DIAGNOSIS — J029 Acute pharyngitis, unspecified: Secondary | ICD-10-CM

## 2015-10-03 DIAGNOSIS — I25110 Atherosclerotic heart disease of native coronary artery with unstable angina pectoris: Secondary | ICD-10-CM

## 2015-10-03 DIAGNOSIS — R682 Dry mouth, unspecified: Secondary | ICD-10-CM | POA: Diagnosis not present

## 2015-10-03 MED ORDER — PANTOPRAZOLE SODIUM 40 MG PO TBEC: 40.0000 mg | DELAYED_RELEASE_TABLET | Freq: Every day | ORAL | 11 refills | Status: DC

## 2015-10-03 NOTE — Assessment & Plan Note (Signed)
Will irrigate 

## 2015-10-03 NOTE — Assessment & Plan Note (Signed)
Labs q 3 mo

## 2015-10-03 NOTE — Progress Notes (Signed)
Pre visit review using our clinic review tool, if applicable. No additional management support is needed unless otherwise documented below in the visit note. 

## 2015-10-03 NOTE — Assessment & Plan Note (Signed)
Lovastatin, ASA 

## 2015-10-03 NOTE — Progress Notes (Signed)
Subjective:  Patient ID: Caleb Nim Sr., male    DOB: 01-21-1941  Age: 75 y.o. MRN: [id number]  CC: No chief complaint on file.   HPI [employer] [employer] Sr. presents for ST x 6 weeks; dry mouth in am, phlegm with belching x 6 weeks. He saw Dr Sharlet Salina did not help Jolyn Nap). He is depressed.  Outpatient Medications Prior to Visit  Medication Sig Dispense Refill  . aspirin 81 MG tablet Take 1 tablet (81 mg total) by mouth daily. 100 tablet 3  . Blood Glucose Monitoring Suppl (PRODIGY BLOOD GLUCOSE MONITOR) DEVI As dirrected 1 each 0  . cholecalciferol (VITAMIN D) 1000 UNITS tablet Take 1 tablet (1,000 Units total) by mouth daily. 100 tablet 3  . finasteride (PROSCAR) 5 MG tablet Take 1 tablet (5 mg total) by mouth daily. For Prostate 90 tablet 3  . FLUoxetine (PROZAC) 10 MG tablet Take 1 tablet (10 mg total) by mouth daily. 30 tablet 1  . gabapentin (NEURONTIN) 300 MG capsule TAKE 1 CAPSULE THREE TIMES DAILY 270 capsule 3  . glimepiride (AMARYL) 2 MG tablet TAKE 1 TABLET TWICE DAILY FOR DIABETES 180 tablet 2  . glucose blood test strip 1 each by Other route 2 (two) times daily. Use to check blood sugars twice a day Dx E11.8 300 each 3  . levocetirizine (XYZAL) 5 MG tablet Take 1 tablet (5 mg total) by mouth every evening. 30 tablet 3  . Linaclotide (LINZESS) 290 MCG CAPS capsule Take 1 capsule (290 mcg total) by mouth daily. 30 capsule 11  . losartan (COZAAR) 50 MG tablet Take 1 tablet (50 mg total) by mouth daily. 90 tablet 2  . lovastatin (MEVACOR) 20 MG tablet Take 1 tablet (20 mg total) by mouth daily. For Cholesterol 90 tablet 3  . metFORMIN (GLUCOPHAGE) 500 MG tablet Take 1 tablet (500 mg total) by mouth daily with breakfast. 90 tablet 2  . metoprolol succinate (TOPROL-XL) 50 MG 24 hr tablet Take 1 tablet (50 mg total) by mouth daily. For Blood Pressure 90 tablet 2  . omeprazole (PRILOSEC) 20 MG capsule Take 2 capsules (40 mg total) by mouth daily. For Stomach 180 capsule 3  .  oxybutynin (DITROPAN) 5 MG tablet 1-2 po qhs for urinary frequency 60 tablet 5  . PRODIGY LANCETS 28G MISC 28 g by Other route 2 (two) times daily. Use to help check blood sugars twice a day Dx E11.8 300 each 3  . tamsulosin (FLOMAX) 0.4 MG CAPS capsule Take 1 capsule (0.4 mg total) by mouth daily. 90 capsule 3  . traMADol (ULTRAM) 50 MG tablet Take 1-2 tablets (50-100 mg total) by mouth 2 (two) times daily as needed. 60 tablet 3  . triamcinolone ointment (KENALOG) 0.5 % Apply 1 application topically 4 (four) times daily. 90 g 0  . valACYclovir (VALTREX) 1000 MG tablet Take 1 tablet (1,000 mg total) by mouth 3 (three) times daily. 21 tablet 0   No facility-administered medications prior to visit.     ROS Review of Systems  Constitutional: Negative for appetite change, fatigue and unexpected weight change.  HENT: Positive for rhinorrhea and sore throat. Negative for congestion, nosebleeds, sneezing and trouble swallowing.   Eyes: Positive for visual disturbance. Negative for itching.  Respiratory: Negative for cough.   Cardiovascular: Negative for chest pain, palpitations and leg swelling.  Gastrointestinal: Negative for abdominal distention, blood in stool, diarrhea and nausea.  Genitourinary: Negative for frequency and hematuria.  Musculoskeletal: Positive for gait problem. Negative for back  pain, joint swelling and neck pain.  Skin: Negative for rash.  Neurological: Negative for dizziness, tremors, speech difficulty and weakness.  Psychiatric/Behavioral: Negative for agitation, dysphoric mood, sleep disturbance and suicidal ideas. The patient is not nervous/anxious.   GERD  Objective:  BP 140/80   Pulse 81   Temp 97.7 F (36.5 C) (Oral)   Wt 205 lb (93 kg)   SpO2 97%   BMI 33.09 kg/m   BP Readings from Last 3 Encounters:  10/03/15 140/80  09/22/15 (!) 148/82  06/17/15 118/68    Wt Readings from Last 3 Encounters:  10/03/15 205 lb (93 kg)  09/22/15 203 lb (92.1 kg)    06/17/15 202 lb (91.6 kg)    Physical Exam  Constitutional: He is oriented to person, place, and time. He appears well-developed. No distress.  NAD  HENT:  Mouth/Throat: Oropharynx is clear and moist.  Eyes: Conjunctivae are normal. Pupils are equal, round, and reactive to light.  Neck: Normal range of motion. No JVD present. No thyromegaly present.  Cardiovascular: Normal rate, regular rhythm, normal heart sounds and intact distal pulses.  Exam reveals no gallop and no friction rub.   No murmur heard. Pulmonary/Chest: Effort normal and breath sounds normal. No respiratory distress. He has no wheezes. He has no rales. He exhibits no tenderness.  Abdominal: Soft. Bowel sounds are normal. He exhibits no distension and no mass. There is no tenderness. There is no rebound and no guarding.  Musculoskeletal: Normal range of motion. He exhibits no edema or tenderness.  Lymphadenopathy:    He has no cervical adenopathy.  Neurological: He is alert and oriented to person, place, and time. He has normal reflexes. No cranial nerve deficit. He exhibits normal muscle tone. He displays a negative Romberg sign. Coordination and gait normal.  Skin: Skin is warm and dry. No rash noted.  Psychiatric: He has a normal mood and affect. His behavior is normal. Judgment and thought content normal.  blind Neck - no mass L ear w/wax   Procedure Note :     Procedure :  Ear irrigation   Indication:  Cerumen impaction L   Risks, including pain, dizziness, eardrum perforation, bleeding, infection and others as well as benefits were explained to the patient in detail. Verbal consent was obtained and the patient agreed to proceed.    We used "The Elephant Ear Irrigation Device" filled with lukewarm water for irrigation. A large amount wax was recovered. Procedure has also required manual wax removal with an ear loop.   Tolerated well. Complications: None.   Postprocedure instructions :  Call if  problems.    Lab Results  Component Value Date   WBC 8.0 08/13/2011   HGB 13.4 08/13/2011   HCT 40.5 08/13/2011   PLT 246.0 08/13/2011   GLUCOSE 174 (H) 09/30/2015   CHOL 128 04/27/2013   TRIG 87.0 04/27/2013   HDL 41.60 04/27/2013   LDLCALC 69 04/27/2013   ALT 22 04/27/2013   AST 17 04/27/2013   NA 138 09/30/2015   K 4.4 09/30/2015   CL 104 09/30/2015   CREATININE 2.10 (H) 09/30/2015   BUN 24 (H) 09/30/2015   CO2 27 09/30/2015   TSH 0.80 08/13/2011   PSA 8.93 (H) 11/23/2014   HGBA1C 7.9 (H) 09/30/2015    Mr Cervical Spine Wo Contrast  Result Date: 08/28/2014 CLINICAL DATA:  Acute LEFT-sided neck pain that radiates into the shoulder and causes weakness. Symptom duration for 2 months. EXAM: MRI CERVICAL SPINE WITHOUT CONTRAST  TECHNIQUE: Multiplanar, multisequence MR imaging of the cervical spine was performed. No intravenous contrast was administered. COMPARISON:  MRI cervical spine 07/15/2007. FINDINGS: Alignment: Mild straightening of the normal cervical lordosis. No subluxation. Vertebrae: No worrisome osseous lesion. Cord: Multilevel cord compression questionable abnormal cord signal at C3-4, C4-5, and C5-6 related to stenosis. Posterior Fossa: No tonsillar herniation. Vertebral Arteries: Patent and equal. Paraspinal tissues: The thyroid gland may be slightly enlarged. This is incompletely evaluated on today's exam. Consider thyroid sonography if there are palpable abnormalities. The gland appears similar to priors. Disc levels: The individual disc spaces were examined as follows: C2-3: Slight bulge. Ossification of the posterior longitudinal ligament. No impingement. C3-4: Superimposed central disc protrusion along with ossification of the posterior longitudinal ligament. Moderate cord flattening. Canal diameter 5-6 mm. BILATERAL foraminal narrowing due to uncinate spurring. C4-5: Moderate disc space narrowing. Central disc osteophyte complex with ossification of the posterior  longitudinal ligament. LEFT greater than RIGHT uncinate spurring results in foraminal narrowing potentially affecting the C5 nerves. Mild cord flattening. Canal diameter 5-6 mm. C5-6: Advanced disc space narrowing. Central disc protrusion with ossification of the posterior longitudinal ligament. LEFT greater than RIGHT foraminal narrowing due to disc material and uncinate spurring. Mild cord flattening. Canal diameter 6 mm. LEFT greater than RIGHT C6 nerve root impingement. C6-7: Shallow central protrusion. Slight ossification of the posterior longitudinal ligament terminating at C6. Slight effacement anterior subarachnoid space without cord compression or foraminal narrowing. C7-T1: Central and leftward protrusion. Mild facet arthropathy. LEFT C8 nerve root impingement not excluded. Compared with priors, a similar appearance is noted. 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 Multilevel foraminal narrowing and spinal stenosis with questionable abnormal cord signal at C3-4, C4-5, and C5-6. Similar appearance to priors. Potentially symptomatic LEFT-sided neural impingement most likely at C4-5, C5-6, and C7-T1. See discussion above. Electronically Signed   By: Staci Righter M.D.   On: 08/28/2014 10:34    Assessment & Plan:   There are no diagnoses linked to this encounter. I am having Mr. Bartoli maintain his PRODIGY BLOOD GLUCOSE MONITOR, glucose blood, PRODIGY LANCETS 28G, cholecalciferol, aspirin, omeprazole, tamsulosin, triamcinolone ointment, valACYclovir, linaclotide, traMADol, oxybutynin, finasteride, gabapentin, FLUoxetine, metoprolol succinate, metFORMIN, losartan, glimepiride, lovastatin, and levocetirizine.  No orders of the defined types were placed in this encounter.    Follow-up: No Follow-up on file.  Walker Kehr, MD

## 2015-10-03 NOTE — Assessment & Plan Note (Signed)
9/17 - poss related to GERD and dry mouth Start Protonix

## 2015-10-03 NOTE — Assessment & Plan Note (Signed)
due to meds - Oxybutinine

## 2015-10-27 ENCOUNTER — Encounter (HOSPITAL_COMMUNITY): Payer: Self-pay

## 2015-10-27 ENCOUNTER — Emergency Department (HOSPITAL_COMMUNITY): Payer: Commercial Managed Care - HMO

## 2015-10-27 ENCOUNTER — Observation Stay (HOSPITAL_COMMUNITY): Payer: Commercial Managed Care - HMO

## 2015-10-27 ENCOUNTER — Telehealth: Payer: Self-pay | Admitting: Internal Medicine

## 2015-10-27 ENCOUNTER — Inpatient Hospital Stay (HOSPITAL_COMMUNITY)
Admission: EM | Admit: 2015-10-27 | Discharge: 2015-10-29 | DRG: 690 | Disposition: A | Discharge status: Home Health | Payer: Commercial Managed Care - HMO | Attending: Internal Medicine | Admitting: Internal Medicine

## 2015-10-27 DIAGNOSIS — R079 Chest pain, unspecified: Secondary | ICD-10-CM | POA: Diagnosis not present

## 2015-10-27 DIAGNOSIS — N390 Urinary tract infection, site not specified: Principal | ICD-10-CM | POA: Diagnosis present

## 2015-10-27 DIAGNOSIS — K219 Gastro-esophageal reflux disease without esophagitis: Secondary | ICD-10-CM | POA: Diagnosis present

## 2015-10-27 DIAGNOSIS — I2510 Atherosclerotic heart disease of native coronary artery without angina pectoris: Secondary | ICD-10-CM | POA: Diagnosis not present

## 2015-10-27 DIAGNOSIS — Z888 Allergy status to other drugs, medicaments and biological substances status: Secondary | ICD-10-CM

## 2015-10-27 DIAGNOSIS — I10 Essential (primary) hypertension: Secondary | ICD-10-CM | POA: Diagnosis not present

## 2015-10-27 DIAGNOSIS — Z794 Long term (current) use of insulin: Secondary | ICD-10-CM

## 2015-10-27 DIAGNOSIS — E785 Hyperlipidemia, unspecified: Secondary | ICD-10-CM | POA: Diagnosis present

## 2015-10-27 DIAGNOSIS — N19 Unspecified kidney failure: Secondary | ICD-10-CM

## 2015-10-27 DIAGNOSIS — E1140 Type 2 diabetes mellitus with diabetic neuropathy, unspecified: Secondary | ICD-10-CM | POA: Diagnosis present

## 2015-10-27 DIAGNOSIS — I252 Old myocardial infarction: Secondary | ICD-10-CM | POA: Diagnosis not present

## 2015-10-27 DIAGNOSIS — N281 Cyst of kidney, acquired: Secondary | ICD-10-CM | POA: Diagnosis present

## 2015-10-27 DIAGNOSIS — N400 Enlarged prostate without lower urinary tract symptoms: Secondary | ICD-10-CM | POA: Diagnosis present

## 2015-10-27 DIAGNOSIS — N183 Chronic kidney disease, stage 3 (moderate): Secondary | ICD-10-CM | POA: Diagnosis not present

## 2015-10-27 DIAGNOSIS — E1142 Type 2 diabetes mellitus with diabetic polyneuropathy: Secondary | ICD-10-CM | POA: Diagnosis present

## 2015-10-27 DIAGNOSIS — Z66 Do not resuscitate: Secondary | ICD-10-CM | POA: Diagnosis present

## 2015-10-27 DIAGNOSIS — Z79899 Other long term (current) drug therapy: Secondary | ICD-10-CM | POA: Diagnosis not present

## 2015-10-27 DIAGNOSIS — Z87891 Personal history of nicotine dependence: Secondary | ICD-10-CM

## 2015-10-27 DIAGNOSIS — Z7984 Long term (current) use of oral hypoglycemic drugs: Secondary | ICD-10-CM | POA: Diagnosis not present

## 2015-10-27 DIAGNOSIS — Z8249 Family history of ischemic heart disease and other diseases of the circulatory system: Secondary | ICD-10-CM

## 2015-10-27 DIAGNOSIS — I129 Hypertensive chronic kidney disease with stage 1 through stage 4 chronic kidney disease, or unspecified chronic kidney disease: Secondary | ICD-10-CM | POA: Diagnosis not present

## 2015-10-27 DIAGNOSIS — Z7982 Long term (current) use of aspirin: Secondary | ICD-10-CM

## 2015-10-27 DIAGNOSIS — E1122 Type 2 diabetes mellitus with diabetic chronic kidney disease: Secondary | ICD-10-CM | POA: Diagnosis present

## 2015-10-27 DIAGNOSIS — E1139 Type 2 diabetes mellitus with other diabetic ophthalmic complication: Secondary | ICD-10-CM | POA: Diagnosis not present

## 2015-10-27 DIAGNOSIS — H548 Legal blindness, as defined in USA: Secondary | ICD-10-CM | POA: Diagnosis not present

## 2015-10-27 DIAGNOSIS — H4050X0 Glaucoma secondary to other eye disorders, unspecified eye, stage unspecified: Secondary | ICD-10-CM | POA: Diagnosis not present

## 2015-10-27 LAB — CBC
HEMATOCRIT: 39.8 % (ref 39.0–52.0)
Hemoglobin: 13.3 g/dL (ref 13.0–17.0)
MCH: 27.9 pg (ref 26.0–34.0)
MCHC: 33.4 g/dL (ref 30.0–36.0)
MCV: 83.4 fL (ref 78.0–100.0)
Platelets: 203 10*3/uL (ref 150–400)
RBC: 4.77 MIL/uL (ref 4.22–5.81)
RDW: 13.7 % (ref 11.5–15.5)
WBC: 21.3 10*3/uL — ABNORMAL HIGH (ref 4.0–10.5)

## 2015-10-27 LAB — BASIC METABOLIC PANEL
Anion gap: 8 (ref 5–15)
BUN: 27 mg/dL — AB (ref 6–20)
CHLORIDE: 105 mmol/L (ref 101–111)
CO2: 22 mmol/L (ref 22–32)
CREATININE: 2.09 mg/dL — AB (ref 0.61–1.24)
Calcium: 8.7 mg/dL — ABNORMAL LOW (ref 8.9–10.3)
GFR calc Af Amer: 34 mL/min — ABNORMAL LOW (ref >60)
GFR calc non Af Amer: 29 mL/min — ABNORMAL LOW (ref >60)
GLUCOSE: 279 mg/dL — AB (ref 65–99)
POTASSIUM: 4.5 mmol/L (ref 3.5–5.1)
SODIUM: 135 mmol/L (ref 135–145)

## 2015-10-27 LAB — I-STAT TROPONIN, ED: TROPONIN I, POC: 0 ng/mL (ref 0.00–0.08)

## 2015-10-27 LAB — CBG MONITORING, ED: Glucose-Capillary: 282 mg/dL — ABNORMAL HIGH (ref 65–99)

## 2015-10-27 LAB — URINE MICROSCOPIC-ADD ON

## 2015-10-27 LAB — URINALYSIS, ROUTINE W REFLEX MICROSCOPIC
Glucose, UA: >1000 mg/dL — AB
KETONES UR: NEGATIVE mg/dL
LEUKOCYTES UA: NEGATIVE
NITRITE: NEGATIVE
PH: 5.5 (ref 5.0–8.0)
Protein, ur: >300 mg/dL — AB
SPECIFIC GRAVITY, URINE: 1.035 — AB (ref 1.005–1.030)

## 2015-10-27 MED ORDER — METOPROLOL SUCCINATE ER 50 MG PO TB24
50.0000 mg | ORAL_TABLET | Freq: Every day | ORAL | Status: DC
  Administered 2015-10-27 – 2015-10-29 (×3): 50 mg via ORAL
  Filled 2015-10-27 (×3): qty 1

## 2015-10-27 MED ORDER — LOSARTAN POTASSIUM 50 MG PO TABS
50.0000 mg | ORAL_TABLET | Freq: Every day | ORAL | Status: DC
  Administered 2015-10-27 – 2015-10-29 (×3): 50 mg via ORAL
  Filled 2015-10-27 (×3): qty 1

## 2015-10-27 MED ORDER — ACETAMINOPHEN 650 MG RE SUPP: 650.0000 mg | Freq: Four times a day (QID) | RECTAL | Status: DC | PRN

## 2015-10-27 MED ORDER — INSULIN ASPART 100 UNIT/ML ~~LOC~~ SOLN
0.0000 [IU] | Freq: Three times a day (TID) | SUBCUTANEOUS | Status: DC
  Administered 2015-10-28 (×2): 1 [IU] via SUBCUTANEOUS
  Administered 2015-10-29: 3 [IU] via SUBCUTANEOUS

## 2015-10-27 MED ORDER — TAMSULOSIN HCL 0.4 MG PO CAPS
0.4000 mg | ORAL_CAPSULE | Freq: Every day | ORAL | Status: DC
  Administered 2015-10-27 – 2015-10-29 (×3): 0.4 mg via ORAL
  Filled 2015-10-27 (×3): qty 1

## 2015-10-27 MED ORDER — ENOXAPARIN SODIUM 40 MG/0.4ML ~~LOC~~ SOLN
40.0000 mg | SUBCUTANEOUS | Status: DC
  Administered 2015-10-27 – 2015-10-28 (×2): 40 mg via SUBCUTANEOUS
  Filled 2015-10-27 (×2): qty 0.4

## 2015-10-27 MED ORDER — ASPIRIN EC 81 MG PO TBEC
81.0000 mg | DELAYED_RELEASE_TABLET | Freq: Every day | ORAL | Status: DC
  Administered 2015-10-27 – 2015-10-29 (×3): 81 mg via ORAL
  Filled 2015-10-27 (×3): qty 1

## 2015-10-27 MED ORDER — SODIUM CHLORIDE 0.9 % IV SOLN
INTRAVENOUS | Status: DC
  Administered 2015-10-27: 22:00:00 via INTRAVENOUS

## 2015-10-27 MED ORDER — LINACLOTIDE 290 MCG PO CAPS
290.0000 ug | ORAL_CAPSULE | Freq: Every day | ORAL | Status: DC
  Administered 2015-10-28 – 2015-10-29 (×2): 290 ug via ORAL
  Filled 2015-10-27 (×2): qty 1

## 2015-10-27 MED ORDER — FINASTERIDE 5 MG PO TABS
5.0000 mg | ORAL_TABLET | Freq: Every day | ORAL | Status: DC
  Administered 2015-10-27 – 2015-10-29 (×3): 5 mg via ORAL
  Filled 2015-10-27 (×3): qty 1

## 2015-10-27 MED ORDER — DEXTROSE 5 % IV SOLN
1.0000 g | INTRAVENOUS | Status: DC
  Administered 2015-10-27 – 2015-10-28 (×2): 1 g via INTRAVENOUS
  Filled 2015-10-27 (×2): qty 10

## 2015-10-27 MED ORDER — ACETAMINOPHEN 325 MG PO TABS: 650.0000 mg | ORAL_TABLET | Freq: Four times a day (QID) | ORAL | Status: DC | PRN

## 2015-10-27 MED ORDER — PRAVASTATIN SODIUM 20 MG PO TABS
20.0000 mg | ORAL_TABLET | Freq: Every day | ORAL | Status: DC
  Administered 2015-10-27 – 2015-10-28 (×2): 20 mg via ORAL
  Filled 2015-10-27 (×2): qty 1

## 2015-10-27 MED ORDER — LINACLOTIDE 290 MCG PO CAPS: 290.0000 ug | ORAL_CAPSULE | Freq: Every day | ORAL | Status: DC

## 2015-10-27 NOTE — ED Provider Notes (Signed)
Patient signed out to me by Dr. Ashok Cordia and the patient's urinalysis consistent with infection. Started on IV Rocephin and will be admitted to the hospitalist service   Lacretia Leigh, MD 10/27/15 [phone]

## 2015-10-27 NOTE — Progress Notes (Signed)
Pharmacy Antibiotic Note  Caleb Mangieri Sr. is a 75 y.o. male admitted on 10/27/2015 with UTI.  Pharmacy has been consulted for ceftriaxone dosing.  Plan: Continue ceftriaxone 1 gr IV q24h     Dosage will likely remain stable at the above dose and need for further dosage adjustment appears unlikely at present.    Will sign off at this time.  Please reconsult if a change in clinical status warrants re-evaluation of dosage.   Height: 5\' 6"  (167.6 cm) Weight: 205 lb (93 kg) IBW/kg (Calculated) : 63.8  Temp (24hrs), Avg:98.3 F (36.8 C), Min:98 F (36.7 C), Max:98.6 F (37 C)   Recent Labs Lab 10/27/15 1252  WBC 21.3*  CREATININE 2.09*    Estimated Creatinine Clearance: 32.1 mL/min (by C-G formula based on SCr of 2.09 mg/dL (H)).    Allergies  Allergen Reactions  . Benazepril Hcl     REACTION: cough  . Verapamil     hallucinations    Antimicrobials this admission: 10/5 ceftriaxone >>    Dose adjustments this admission: ---  Microbiology results: 10/5 UCx: sent    Thank you for allowing pharmacy to be a part of this patient's care.  Royetta Asal, PharmD, BCPS Pager [phone] 10/27/2015 8:36 PM

## 2015-10-27 NOTE — Progress Notes (Signed)
EDCM spoke to patient and his daughters at bedside.  Patient lives at [facility].  Daughters reports it is assisted living, however when [facility] looked up facility it is labled as Independent living facility.  Patient is completely blind. He reports, "I get around pretty good."  He reports he uses a cane to ambulate.  Daughters report his apartment is set up in a way in which he can find things.  Patient's daughters/family check on patient often.  Patient reports he is usually able to perform his own ADL's without difficulty.  Patient's daughters report they assist patient with his medications.  Patient cannot think of any further dme or home health service that he needs at this time.  No further EDCM needs at this time.

## 2015-10-27 NOTE — ED Notes (Signed)
Patient transported to X-ray 

## 2015-10-27 NOTE — H&P (Signed)
TRH H&P    Patient Demographics:    Caleb Dunn, is a 75 y.o. male  MRN: [id number]  DOB - 1940-12-08  Admit Date - 10/27/2015  Referring MD/NP/PA: Dr. Antony Haste  Outpatient Primary MD for the patient is Walker Kehr, MD  Patient coming from: Home  Chief Complaint  Patient presents with  . Weakness      HPI:    Caleb Dunn  is a 75 y.o. male, With history of glaucoma, blindness in both eyes, diabetes mellitus, hypertension, CAD who came to the hospital with generalized weakness which started last night. Patient is blind by both eyes at baseline. He complains of difficulty in urination. No fever or chills. No chest pain or shortness of breath. No cough. No abdominal pain. No nausea vomiting or diarrhea.  In the ED UA showed significant WBCs with cloudy urine. WBC 21,000 Patient being admitted for UTI   Review of systems:      A full 10 point Review of Systems was done, except as stated above, all other Review of Systems were negative.   With Past History of the following :    Past Medical History:  Diagnosis Date  . Blindness   . CAD (coronary artery disease)   . Cholelithiasis   . CTS (carpal tunnel syndrome)    Left  . DM type 2 with diabetic peripheral neuropathy ([place])   . ED (erectile dysfunction)   . Elevated PSA   . GERD (gastroesophageal reflux disease)   . Glaucoma   . HTN (hypertension)   . 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 MI (myocardial infarction) 1991  . Thyroid nodule   . Type II or unspecified type diabetes mellitus without mention of complication, not stated as uncontrolled       Past Surgical History:  Procedure Laterality Date  . CHOLECYSTECTOMY        Social History:      Social History  Substance Use Topics  . Smoking status: Former Research scientist (life sciences)  . Smokeless tobacco: Never Used  . Alcohol use No       Family History :     Family  History  Problem Relation Age of Onset  . Hypertension Mother   . Kidney disease Father   . Hypertension Father   . Coronary artery disease      1st degree Male relative <60  . Diabetes      1st degree relative      Home Medications:   Prior to Admission medications   Medication Sig Start Date End Date Taking? Authorizing Provider  aspirin 81 MG tablet Take 1 tablet (81 mg total) by mouth daily. 07/08/14  Yes Evie Lacks Plotnikov, MD  Blood Glucose Monitoring Suppl (PRODIGY BLOOD GLUCOSE MONITOR) DEVI As dirrected 08/22/12  Yes Cassandria Anger, MD  cholecalciferol (VITAMIN D) 1000 UNITS tablet Take 1 tablet (1,000 Units total) by mouth daily. 04/21/14  Yes Evie Lacks Plotnikov, MD  finasteride (PROSCAR) 5 MG tablet Take 1 tablet (5 mg total) by mouth daily. For Prostate  01/26/15  Yes Evie Lacks Plotnikov, MD  glimepiride (AMARYL) 2 MG tablet TAKE 1 TABLET TWICE DAILY FOR DIABETES 08/22/15  Yes Evie Lacks Plotnikov, MD  glucose blood test strip 1 each by Other route 2 (two) times daily. Use to check blood sugars twice a day Dx E11.8 01/13/14  Yes Evie Lacks Plotnikov, MD  losartan (COZAAR) 50 MG tablet Take 1 tablet (50 mg total) by mouth daily. 08/22/15  Yes Evie Lacks Plotnikov, MD  lovastatin (MEVACOR) 20 MG tablet Take 1 tablet (20 mg total) by mouth daily. For Cholesterol 09/07/15  Yes Cassandria Anger, MD  metFORMIN (GLUCOPHAGE) 500 MG tablet Take 1 tablet (500 mg total) by mouth daily with breakfast. 08/22/15  Yes Cassandria Anger, MD  metoprolol succinate (TOPROL-XL) 50 MG 24 hr tablet Take 1 tablet (50 mg total) by mouth daily. For Blood Pressure 08/22/15  Yes Evie Lacks Plotnikov, MD  PRODIGY LANCETS 28G MISC 28 g by Other route 2 (two) times daily. Use to help check blood sugars twice a day Dx E11.8 01/13/14  Yes Evie Lacks Plotnikov, MD  FLUoxetine (PROZAC) 10 MG tablet Take 1 tablet (10 mg total) by mouth daily. Patient not taking: Reported on 10/27/2015 06/23/15   Cassandria Anger, MD  gabapentin (NEURONTIN) 300 MG capsule TAKE 1 CAPSULE THREE TIMES DAILY Patient not taking: Reported on 10/27/2015 05/09/15   Cassandria Anger, MD  levocetirizine (XYZAL) 5 MG tablet Take 1 tablet (5 mg total) by mouth every evening. Patient not taking: Reported on 10/27/2015 09/22/15   Hoyt Koch, MD  Linaclotide [facility]) 290 MCG CAPS capsule Take 1 capsule (290 mcg total) by mouth daily. Patient not taking: Reported on 10/27/2015 07/13/14   Evie Lacks Plotnikov, MD  omeprazole (PRILOSEC) 20 MG capsule Take 2 capsules (40 mg total) by mouth daily. For Stomach Patient not taking: Reported on 10/27/2015 07/08/14   Cassandria Anger, MD  oxybutynin (DITROPAN) 5 MG tablet 1-2 po qhs for urinary frequency Patient not taking: Reported on 10/27/2015 11/23/14   Evie Lacks Plotnikov, MD  pantoprazole (PROTONIX) 40 MG tablet Take 1 tablet (40 mg total) by mouth daily. Patient not taking: Reported on 10/27/2015 10/03/15   Evie Lacks Plotnikov, MD  tamsulosin (FLOMAX) 0.4 MG CAPS capsule Take 1 capsule (0.4 mg total) by mouth daily. Patient not taking: Reported on 10/27/2015 07/08/14   Evie Lacks Plotnikov, MD  traMADol (ULTRAM) 50 MG tablet Take 1-2 tablets (50-100 mg total) by mouth 2 (two) times daily as needed. Patient not taking: Reported on 10/27/2015 10/01/14   Evie Lacks Plotnikov, MD  triamcinolone ointment (KENALOG) 0.5 % Apply 1 application topically 4 (four) times daily. Patient not taking: Reported on 10/27/2015 07/13/14   Cassandria Anger, MD  valACYclovir (VALTREX) 1000 MG tablet Take 1 tablet (1,000 mg total) by mouth 3 (three) times daily. Patient not taking: Reported on 10/27/2015 07/13/14   Cassandria Anger, MD     Allergies:     Allergies  Allergen Reactions  . Benazepril Hcl     REACTION: cough  . Verapamil     hallucinations     Physical Exam:   Vitals  Blood pressure 158/99, pulse 92, temperature 98 F (36.7 C), temperature source Oral, resp. rate 17,  height 5\' 6"  (1.676 m), weight 93 kg (205 lb), SpO2 95 %.  1.  General: African-American male in no acute distress  2. Psychiatric:  Intact judgement and  insight, awake alert, oriented x 3.  3.  Neurologic: No focal neurological deficits, all cranial nerves intact.Strength 5/5 all 4 extremities, sensation intact all 4 extremities, plantars down going.  4. Eyes : Blind in both eyes  5. ENMT:  Oropharynx clear with moist mucous membranes and good dentition  6. Neck:  supple, no cervical lymphadenopathy appriciated, No thyromegaly  7. Respiratory : Normal respiratory effort, good air movement bilaterally,clear to  auscultation bilaterally  8. Cardiovascular : RRR, no gallops, rubs or murmurs, no leg edema  9. Gastrointestinal:  Positive bowel sounds, abdomen soft, non-tender to palpation,no hepatosplenomegaly, no rigidity or guarding       10. Skin:  No cyanosis, normal texture and turgor, no rash, lesions or ulcers  11.Musculoskeletal:  Good muscle tone,  joints appear normal , no effusions,  normal range of motion    Data Review:    CBC  Recent Labs Lab 10/27/15 1252  WBC 21.3*  HGB 13.3  HCT 39.8  PLT 203  MCV 83.4  MCH 27.9  MCHC 33.4  RDW 13.7   ------------------------------------------------------------------------------------------------------------------  Chemistries   Recent Labs Lab 10/27/15 1252  NA 135  K 4.5  CL 105  CO2 22  GLUCOSE 279*  BUN 27*  CREATININE 2.09*  CALCIUM 8.7*   ------------------------------------------------------------------------------------------------------------------  ------------------------------------------------------------------------------------------------------------------ CBG:  Recent Labs Lab 10/27/15 1356  GLUCAP 282*    --------------------------------------------------------------------------------------------------------------- Urine analysis:    Component Value Date/Time   COLORURINE  YELLOW 10/27/2015 1240   APPEARANCEUR CLOUDY (A) 10/27/2015 1240   LABSPEC 1.035 (H) 10/27/2015 1240   PHURINE 5.5 10/27/2015 1240   GLUCOSEU >1000 (A) 10/27/2015 1240   GLUCOSEU NEGATIVE 08/13/2011 0750   HGBUR MODERATE (A) 10/27/2015 1240   BILIRUBINUR SMALL (A) 10/27/2015 1240   KETONESUR NEGATIVE 10/27/2015 1240   PROTEINUR >300 (A) 10/27/2015 1240   UROBILINOGEN 0.2 08/13/2011 0750   NITRITE NEGATIVE 10/27/2015 1240   LEUKOCYTESUR NEGATIVE 10/27/2015 1240      Imaging Results:    Dg Chest 2 View  Result Date: 10/27/2015 CLINICAL DATA:  Chest pain EXAM: CHEST  2 VIEW COMPARISON:  02/12/2005 FINDINGS: Cardiac shadow is stable. Postsurgical changes are again seen. No acute infiltrate or sizable effusion is noted. Degenerative changes of thoracic spine are seen. IMPRESSION: No active cardiopulmonary disease. Electronically Signed   By: [name] M.D.   On: 10/27/2015 14:02    My personal review of EKG: Rhythm NSR   Assessment & Plan:    Active Problems:   Glaucoma associated with ocular disorder   DM (diabetes mellitus), type 2 with ophthalmic complications (HCC)   UTI (urinary tract infection)   1. UTI- place under  observation, start ceftriaxone per pharmacy. Follow urine culture results 2. Chronic kidney stage III-today creatinine is 2.09, which is around baseline patient has history of BPH, will obtain renal ultrasound.  3. Diabetes mellitus- start sliding scale insulin with NovoLog. Hold oral hypoglycemic agents 4. Hypertension- blood pressure is controlled, continue metoprolol, Cozaar 5. Hyperlipidemia-continue statin   DVT Prophylaxis-   Lovenox   AM Labs Ordered, also please review Full Orders  Family Communication: Admission, patients condition and plan of care including tests being ordered have been discussed with the patient and his daughter at bedside who indicate understanding and agree with the plan and Code Status.  Code Status:  DO NOT  RESUSCITATE  Admission status: Observation    Time spent in minutes : 60 minutes   Hermes Wafer S M.D on 10/27/2015 at 5:16 PM  Between 7am to 7pm - Pager - [phone]. After  7pm go to [email or web address] - password [facility]  [facility] - Office  [phone]

## 2015-10-27 NOTE — ED Triage Notes (Signed)
Pt presents to ED c/o a "flame in his throat" x 1-2 mos. Pt also reports an episode of nocturia and weakness last night and states he "couldn't get out of bed". Pt denies SOB, difficulty breathing, and pain. Facial symmetry noted.

## 2015-10-27 NOTE — Progress Notes (Signed)
Patient is blind

## 2015-10-27 NOTE — Telephone Encounter (Signed)
Patient Name: Caleb Dunn DOB: Feb 17, 1940 Initial Comment Pt is having a problem speaking. She is also wetting the bed. Nurse Assessment Nurse: Roosvelt Maser, RN, Barnetta Chapel Date/Time (Eastern Time): 10/27/2015 10:39:58 AM Confirm and document reason for call. If symptomatic, describe symptoms. You must click the next button to save text entered. ---caller states trouble speaking for past two weeks, saw pcp about it and were given pills for it. speech is getting worse and too weak to get up to go to the bathroom last night, wet the bed. now feeling better able to walk with a cane without much difficulty Has the patient traveled out of the country within the last 30 days? ---Not Applicable Does the patient have any new or worsening symptoms? ---Yes Will a triage be completed? ---Yes Related visit to physician within the last 2 weeks? ---Yes Does the PT have any chronic conditions? (i.e. diabetes, asthma, etc.) ---Unknown Is this a behavioral health or substance abuse call? ---No Guidelines Guideline Title Affirmed Question Affirmed Notes Weakness (Generalized) and Fatigue [1] MODERATE weakness (i.e., interferes with work, school, normal activities) AND [2] cause unknown (Exceptions: weakness with acute minor illness, or weakness from poor fluid intake) Final Disposition User See Physician within 4 Hours (or PCP triage) Roosvelt Maser, RN, Barnetta Chapel Comments no available appts in the next 4 hours Referrals [facility] - ED Disagree/Comply: Comply

## 2015-10-27 NOTE — ED Provider Notes (Signed)
[place] DEPT Provider Note   CSN: [id number] Arrival date & time: 10/27/15  1225     History   Chief Complaint Chief Complaint  Patient presents with  . Weakness    HPI Caleb Holcomb Sr. is a 75 y.o. male.  Patient with c/o generalized weakness for the past week. Gradual onset, persistent, worse today. No focal or unilateral numbness or weakness. Hx being blind, at baseline. No change in speech. No loss of baseline functional ability. No falls.  Denies fever or chills. States hs unspec prostate problems, and has been going to bathroom more at night, ?odor.  Denies headache. No cough or uri c/o. No chest pain or discomfort. No sob. No abd pain. No vomiting or diarrhea. Denies change in meds or  New meds.      The history is provided by the patient.  Weakness  Pertinent negatives include no shortness of breath, no chest pain, no vomiting, no confusion and no headaches.    Past Medical History:  Diagnosis Date  . CAD (coronary artery disease)   . Cholelithiasis   . CTS (carpal tunnel syndrome)    Left  . DM type 2 with diabetic peripheral neuropathy ([place])   . ED (erectile dysfunction)   . Elevated PSA   . GERD (gastroesophageal reflux disease)   . Glaucoma   . HTN (hypertension)   . 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 MI (myocardial infarction) 1991  . Thyroid nodule   . Type II or unspecified type diabetes mellitus without mention of complication, not stated as uncontrolled     Patient Active Problem List   Diagnosis Date Noted  . Impacted cerumen of left ear 10/03/2015  . Sore throat in the morning 09/23/2015  . Situational mixed anxiety and depressive disorder 06/17/2015  . Neuropathic pain 03/09/2015  . Insomnia 03/09/2015  . Left arm weakness 08/13/2014  . Herpes zoster 07/13/2014  . Constipation 07/13/2014  . Epididymitis 02/23/2014  . Pain in joint, ankle and foot 08/04/2013  . Dry mouth 05/05/2013  . 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 Dizziness 11/01/2011  . Obesity (BMI 30-39.9)   . Chronic kidney disease (CKD), stage IV (severe) ([place])   . Elevated PSA 08/17/2011  . Bladder neck obstruction 04/13/2011  . DM (diabetes mellitus), type 2 with ophthalmic complications ([place])   . Glaucoma associated with ocular disorder   . Carpal tunnel sundrome   . Erectile dysfunction   . GERD   . Cervical disc disorder with radiculopathy   . Hyperlipidemia   . Cholelithiases   . Hypertensive heart disease   . Coronary atherosclerosis     Past Surgical History:  Procedure Laterality Date  . CHOLECYSTECTOMY         Home Medications    Prior to Admission medications   Medication Sig Start Date End Date Taking? Authorizing Provider  aspirin 81 MG tablet Take 1 tablet (81 mg total) by mouth daily. 07/08/14   Evie Lacks Plotnikov, MD  Blood Glucose Monitoring Suppl (PRODIGY BLOOD GLUCOSE MONITOR) DEVI As dirrected 08/22/12   Cassandria Anger, MD  cholecalciferol (VITAMIN D) 1000 UNITS tablet Take 1 tablet (1,000 Units total) by mouth daily. 04/21/14   Evie Lacks Plotnikov, MD  finasteride (PROSCAR) 5 MG tablet Take 1 tablet (5 mg total) by mouth daily. For Prostate 01/26/15   Evie Lacks Plotnikov, MD  FLUoxetine (PROZAC) 10 MG tablet Take 1 tablet (10 mg total) by mouth daily. 06/23/15   Aleksei  V Plotnikov, MD  gabapentin (NEURONTIN) 300 MG capsule TAKE 1 CAPSULE THREE TIMES DAILY 05/09/15   Evie Lacks Plotnikov, MD  glimepiride (AMARYL) 2 MG tablet TAKE 1 TABLET TWICE DAILY FOR DIABETES 08/22/15   Evie Lacks Plotnikov, MD  glucose blood test strip 1 each by Other route 2 (two) times daily. Use to check blood sugars twice a day Dx E11.8 01/13/14   Cassandria Anger, MD  levocetirizine (XYZAL) 5 MG tablet Take 1 tablet (5 mg total) by mouth every evening. 09/22/15   Hoyt Koch, MD  Linaclotide [facility]) 290 MCG CAPS capsule Take 1 capsule (290 mcg total) by mouth daily. 07/13/14   Evie Lacks Plotnikov, MD  losartan  (COZAAR) 50 MG tablet Take 1 tablet (50 mg total) by mouth daily. 08/22/15   Evie Lacks Plotnikov, MD  lovastatin (MEVACOR) 20 MG tablet Take 1 tablet (20 mg total) by mouth daily. For Cholesterol 09/07/15   Cassandria Anger, MD  metFORMIN (GLUCOPHAGE) 500 MG tablet Take 1 tablet (500 mg total) by mouth daily with breakfast. 08/22/15   Cassandria Anger, MD  metoprolol succinate (TOPROL-XL) 50 MG 24 hr tablet Take 1 tablet (50 mg total) by mouth daily. For Blood Pressure 08/22/15   Evie Lacks Plotnikov, MD  omeprazole (PRILOSEC) 20 MG capsule Take 2 capsules (40 mg total) by mouth daily. For Stomach 07/08/14   Cassandria Anger, MD  oxybutynin (DITROPAN) 5 MG tablet 1-2 po qhs for urinary frequency 11/23/14   Cassandria Anger, MD  pantoprazole (PROTONIX) 40 MG tablet Take 1 tablet (40 mg total) by mouth daily. 10/03/15   Evie Lacks Plotnikov, MD  PRODIGY LANCETS 28G MISC 28 g by Other route 2 (two) times daily. Use to help check blood sugars twice a day Dx E11.8 01/13/14   Evie Lacks Plotnikov, MD  tamsulosin (FLOMAX) 0.4 MG CAPS capsule Take 1 capsule (0.4 mg total) by mouth daily. 07/08/14   Evie Lacks Plotnikov, MD  traMADol (ULTRAM) 50 MG tablet Take 1-2 tablets (50-100 mg total) by mouth 2 (two) times daily as needed. 10/01/14   Evie Lacks Plotnikov, MD  triamcinolone ointment (KENALOG) 0.5 % Apply 1 application topically 4 (four) times daily. 07/13/14   Evie Lacks Plotnikov, MD  valACYclovir (VALTREX) 1000 MG tablet Take 1 tablet (1,000 mg total) by mouth 3 (three) times daily. 07/13/14   Cassandria Anger, MD    Family History Family History  Problem Relation Age of Onset  . Hypertension Mother   . Kidney disease Father   . Hypertension Father   . Coronary artery disease      1st degree Male relative <60  . Diabetes      1st degree relative    Social History Social History  Substance Use Topics  . Smoking status: Former Research scientist (life sciences)  . Smokeless tobacco: Never Used  . Alcohol use No      Allergies   Benazepril hcl and Verapamil   Review of Systems Review of Systems  Constitutional: Negative for fever.  HENT: Negative for sore throat.   Eyes:       Blind, no new visual c/o.   Respiratory: Negative for cough and shortness of breath.   Cardiovascular: Negative for chest pain.  Gastrointestinal: Negative for abdominal pain, diarrhea and vomiting.  Genitourinary: Negative for dysuria.  Musculoskeletal: Negative for back pain and neck pain.  Skin: Negative for rash.  Neurological: Positive for weakness. Negative for numbness and headaches.  Hematological: Does not bruise/bleed easily.  Psychiatric/Behavioral: Negative for confusion.     Physical Exam Updated Vital Signs BP 137/71 (BP Location: Left Arm)   Pulse 87   Temp 98 F (36.7 C) (Oral)   Resp 16   Ht 5\' 6"  (1.676 m)   Wt 93 kg   SpO2 98%   BMI 33.09 kg/m   Physical Exam  Constitutional: He is oriented to person, place, and time. He appears well-developed and well-nourished. No distress.  HENT:  Mouth/Throat: Oropharynx is clear and moist.  Eyes: Conjunctivae are normal. No scleral icterus.  Neck: Neck supple. No tracheal deviation present.  Cardiovascular: Normal rate, regular rhythm, normal heart sounds and intact distal pulses.   Pulmonary/Chest: Effort normal and breath sounds normal. No accessory muscle usage. No respiratory distress.  Abdominal: Soft. Bowel sounds are normal. He exhibits no distension. There is no tenderness.  Genitourinary:  Genitourinary Comments: No cva tenderness  Musculoskeletal: Normal range of motion. He exhibits no edema or tenderness.  Neurological: He is alert and oriented to person, place, and time.  Motor intact bil. stre 5/5. No pronator drift. Sensation grossly intact.   Skin: Skin is warm and dry. No rash noted. He is not diaphoretic.  Psychiatric: He has a normal mood and affect.  Nursing note and vitals reviewed.    ED Treatments / Results   Labs (all labs ordered are listed, but only abnormal results are displayed) Results for orders placed or performed during the hospital encounter of 49/44/96  Basic metabolic panel  Result Value Ref Range   Sodium 135 135 - 145 mmol/L   Potassium 4.5 3.5 - 5.1 mmol/L   Chloride 105 101 - 111 mmol/L   CO2 22 22 - 32 mmol/L   Glucose, Bld 279 (H) 65 - 99 mg/dL   BUN 27 (H) 6 - 20 mg/dL   Creatinine, Ser 2.09 (H) 0.61 - 1.24 mg/dL   Calcium 8.7 (L) 8.9 - 10.3 mg/dL   GFR calc non Af Amer 29 (L) >60 mL/min   GFR calc Af Amer 34 (L) >60 mL/min   Anion gap 8 5 - 15  CBC  Result Value Ref Range   WBC 21.3 (H) 4.0 - 10.5 K/uL   RBC 4.77 4.22 - 5.81 MIL/uL   Hemoglobin 13.3 13.0 - 17.0 g/dL   HCT 39.8 39.0 - 52.0 %   MCV 83.4 78.0 - 100.0 fL   MCH 27.9 26.0 - 34.0 pg   MCHC 33.4 30.0 - 36.0 g/dL   RDW 13.7 11.5 - 15.5 %   Platelets 203 150 - 400 K/uL  CBG monitoring, ED  Result Value Ref Range   Glucose-Capillary 282 (H) 65 - 99 mg/dL  I-stat troponin, ED  Result Value Ref Range   Troponin i, poc 0.00 0.00 - 0.08 ng/mL   Comment 3           Dg Chest 2 View  Result Date: 10/27/2015 CLINICAL DATA:  Chest pain EXAM: CHEST  2 VIEW COMPARISON:  02/12/2005 FINDINGS: Cardiac shadow is stable. Postsurgical changes are again seen. No acute infiltrate or sizable effusion is noted. Degenerative changes of thoracic spine are seen. IMPRESSION: No active cardiopulmonary disease. Electronically Signed   By: [name] M.D.   On: 10/27/2015 14:02    EKG  EKG Interpretation  Date/Time:  Thursday October 27 2015 12:48:30 EDT Ventricular Rate:  86 PR Interval:    QRS Duration: 101 QT Interval:  355 QTC Calculation: 425 R Axis:   -29 Text Interpretation:  Sinus rhythm Ventricular premature complex Borderline left axis deviation Nonspecific T abnormalities, lateral leads Baseline wander in lead(s) II III aVL aVF Confirmed by Ashok Cordia  MD, Lennette Bihari ([id number]) on 10/27/2015 1:28:59 PM        Radiology No results found.  Procedures Procedures (including critical care time)  Medications Ordered in ED Medications - No data to display   Initial Impression / Assessment and Plan / ED Course  I have reviewed the triage vital signs and the nursing notes.  Pertinent labs & imaging results that were available during my care of the patient were reviewed by me and considered in my medical decision making (see chart for details).  Clinical Course   Iv ns. Labs. Ua.   Reviewed nursing notes and prior charts for additional history.   UA still pending.   1515, signed out to Dr Zenia Resides, to check UA when back - given gen weakness, elev wbc, feel likely will benefit from admit esp if uti.     Final Clinical Impressions(s) / ED Diagnoses   Final diagnoses:  None    New Prescriptions New Prescriptions   No medications on file     Lajean Saver, MD 10/27/15 1515

## 2015-10-28 DIAGNOSIS — Z66 Do not resuscitate: Secondary | ICD-10-CM | POA: Diagnosis present

## 2015-10-28 DIAGNOSIS — H548 Legal blindness, as defined in USA: Secondary | ICD-10-CM | POA: Diagnosis present

## 2015-10-28 DIAGNOSIS — I252 Old myocardial infarction: Secondary | ICD-10-CM | POA: Diagnosis not present

## 2015-10-28 DIAGNOSIS — E785 Hyperlipidemia, unspecified: Secondary | ICD-10-CM | POA: Diagnosis present

## 2015-10-28 DIAGNOSIS — N183 Chronic kidney disease, stage 3 (moderate): Secondary | ICD-10-CM | POA: Diagnosis present

## 2015-10-28 DIAGNOSIS — I129 Hypertensive chronic kidney disease with stage 1 through stage 4 chronic kidney disease, or unspecified chronic kidney disease: Secondary | ICD-10-CM | POA: Diagnosis present

## 2015-10-28 DIAGNOSIS — Z79899 Other long term (current) drug therapy: Secondary | ICD-10-CM | POA: Diagnosis not present

## 2015-10-28 DIAGNOSIS — I2510 Atherosclerotic heart disease of native coronary artery without angina pectoris: Secondary | ICD-10-CM | POA: Diagnosis present

## 2015-10-28 DIAGNOSIS — Z87891 Personal history of nicotine dependence: Secondary | ICD-10-CM | POA: Diagnosis not present

## 2015-10-28 DIAGNOSIS — E1140 Type 2 diabetes mellitus with diabetic neuropathy, unspecified: Secondary | ICD-10-CM | POA: Diagnosis present

## 2015-10-28 DIAGNOSIS — K219 Gastro-esophageal reflux disease without esophagitis: Secondary | ICD-10-CM | POA: Diagnosis present

## 2015-10-28 DIAGNOSIS — N400 Enlarged prostate without lower urinary tract symptoms: Secondary | ICD-10-CM | POA: Diagnosis present

## 2015-10-28 DIAGNOSIS — E1122 Type 2 diabetes mellitus with diabetic chronic kidney disease: Secondary | ICD-10-CM | POA: Diagnosis present

## 2015-10-28 DIAGNOSIS — Z888 Allergy status to other drugs, medicaments and biological substances status: Secondary | ICD-10-CM | POA: Diagnosis not present

## 2015-10-28 DIAGNOSIS — Z7984 Long term (current) use of oral hypoglycemic drugs: Secondary | ICD-10-CM | POA: Diagnosis not present

## 2015-10-28 DIAGNOSIS — Z794 Long term (current) use of insulin: Secondary | ICD-10-CM | POA: Diagnosis not present

## 2015-10-28 DIAGNOSIS — I10 Essential (primary) hypertension: Secondary | ICD-10-CM | POA: Diagnosis not present

## 2015-10-28 DIAGNOSIS — N390 Urinary tract infection, site not specified: Secondary | ICD-10-CM | POA: Diagnosis present

## 2015-10-28 DIAGNOSIS — E1139 Type 2 diabetes mellitus with other diabetic ophthalmic complication: Secondary | ICD-10-CM | POA: Diagnosis present

## 2015-10-28 DIAGNOSIS — H4050X0 Glaucoma secondary to other eye disorders, unspecified eye, stage unspecified: Secondary | ICD-10-CM | POA: Diagnosis present

## 2015-10-28 DIAGNOSIS — Z7982 Long term (current) use of aspirin: Secondary | ICD-10-CM | POA: Diagnosis not present

## 2015-10-28 DIAGNOSIS — Z8249 Family history of ischemic heart disease and other diseases of the circulatory system: Secondary | ICD-10-CM | POA: Diagnosis not present

## 2015-10-28 DIAGNOSIS — N281 Cyst of kidney, acquired: Secondary | ICD-10-CM | POA: Diagnosis present

## 2015-10-28 DIAGNOSIS — E1142 Type 2 diabetes mellitus with diabetic polyneuropathy: Secondary | ICD-10-CM | POA: Diagnosis present

## 2015-10-28 LAB — GLUCOSE, CAPILLARY
GLUCOSE-CAPILLARY: 136 mg/dL — AB (ref 65–99)
GLUCOSE-CAPILLARY: 134 mg/dL — AB (ref 65–99)
GLUCOSE-CAPILLARY: 142 mg/dL — AB (ref 65–99)
Glucose-Capillary: 126 mg/dL — ABNORMAL HIGH (ref 65–99)

## 2015-10-28 LAB — COMPREHENSIVE METABOLIC PANEL
ALK PHOS: 58 U/L (ref 38–126)
ALT: 16 U/L — ABNORMAL LOW (ref 17–63)
ANION GAP: 9 (ref 5–15)
AST: 14 U/L — ABNORMAL LOW (ref 15–41)
Albumin: 3.2 g/dL — ABNORMAL LOW (ref 3.5–5.0)
BUN: 23 mg/dL — ABNORMAL HIGH (ref 6–20)
CALCIUM: 8.6 mg/dL — AB (ref 8.9–10.3)
CO2: 22 mmol/L (ref 22–32)
Chloride: 107 mmol/L (ref 101–111)
Creatinine, Ser: 1.8 mg/dL — ABNORMAL HIGH (ref 0.61–1.24)
GFR calc non Af Amer: 35 mL/min — ABNORMAL LOW (ref >60)
GFR, EST AFRICAN AMERICAN: 40 mL/min — AB (ref >60)
Glucose, Bld: 148 mg/dL — ABNORMAL HIGH (ref 65–99)
POTASSIUM: 4.1 mmol/L (ref 3.5–5.1)
SODIUM: 138 mmol/L (ref 135–145)
TOTAL PROTEIN: 7.6 g/dL (ref 6.5–8.1)
Total Bilirubin: 0.9 mg/dL (ref 0.3–1.2)

## 2015-10-28 LAB — CBC
HCT: 37.3 % — ABNORMAL LOW (ref 39.0–52.0)
HEMOGLOBIN: 12 g/dL — AB (ref 13.0–17.0)
MCH: 26.8 pg (ref 26.0–34.0)
MCHC: 32.2 g/dL (ref 30.0–36.0)
MCV: 83.3 fL (ref 78.0–100.0)
PLATELETS: 185 10*3/uL (ref 150–400)
RBC: 4.48 MIL/uL (ref 4.22–5.81)
RDW: 13.8 % (ref 11.5–15.5)
WBC: 20.2 10*3/uL — ABNORMAL HIGH (ref 4.0–10.5)

## 2015-10-28 LAB — MRSA PCR SCREENING: MRSA by PCR: NEGATIVE

## 2015-10-28 MED ORDER — LIVING WELL WITH DIABETES BOOK
Freq: Once | Status: AC
  Administered 2015-10-28: 16:00:00
  Filled 2015-10-28 (×2): qty 1

## 2015-10-28 MED ORDER — HYDRALAZINE HCL 20 MG/ML IJ SOLN: 10.0000 mg | Freq: Four times a day (QID) | INTRAMUSCULAR | Status: DC | PRN

## 2015-10-28 NOTE — Progress Notes (Signed)
Inpatient Diabetes Program Recommendations  AACE/ADA: New Consensus Statement on Inpatient Glycemic Control (2015)  Target Ranges:  Prepandial:   less than 140 mg/dL      Peak postprandial:   less than 180 mg/dL (1-2 hours)      Critically ill patients:  140 - 180 mg/dL   Lab Results  Component Value Date   GLUCAP 126 (H) 10/28/2015   HGBA1C 7.9 (H) 09/30/2015    Review of Glycemic Control  Diabetes history: DM2 Outpatient Diabetes medications: Metformin 500 mg daily with breakfast, Amaryl 2 mg BID Current orders for Inpatient glycemic control: Novolog 0-9 units [facility]  Inpatient Diabetes Program Recommendations: Please consider:  Novolog 0-5 units QHS;  Carb modified diet. Note: Talked with patient and daughters and discussed plate method, hypo and hyperglycemia symptoms, and living well with diabetes (ordered book to reinforce).  Thank you,  Windy Carina, RN, BSN Diabetes Coordinator Inpatient Diabetes Program [phone] (Team Pager) [phone] ([facility] office) [phone] [facility] office) [phone] [facility] office)

## 2015-10-28 NOTE — Care Management Obs Status (Signed)
[place] NOTIFICATION   Patient Details  Name: Caleb Denomme Sr. MRN: [id number] Date of Birth: Jul 09, 1940   Medicare Observation Status Notification Given:  Yes/[place] understanding received after reading the document to the patient-Patient is blind.    Leeroy Cha, RN 10/28/2015, 1:45 PM

## 2015-10-28 NOTE — Progress Notes (Addendum)
[facility] PROGRESS NOTE  Chasin Findling Sr. 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 He was found to have an abnormal UA. He was thought to have a urinary tract infection. Patient was noted to be quite fatigued and it was felt that it would be safe to get him hospitalized for further treatment.  Reason for Visit: Urinary tract infection  Consultants: None  Procedures: None  Antibiotics: Ceftriaxone  Subjective/Interval History: Patient feels slightly better this morning. Still has some generalized weakness and fatigue. Has discomfort with urination has improved some. He denies any nausea or vomiting.  ROS: Denies Chest pain or shortness of breath.  Objective:  Vital Signs  Vitals:   10/27/15 1815 10/27/15 1927 10/27/15 1948 10/28/15 0540  BP: (!) 236/199 193/88 (!) 190/74 (!) 153/63  Pulse: 95  (!) 101 67  Resp: 16  17 17   Temp:   98.6 F (37 C) 98 F (36.7 C)  TempSrc:   Oral Oral  SpO2: 100%  100% 100%  Weight:      Height:        Intake/Output Summary (Last 24 hours) at 10/28/15 1140 Last data filed at 10/28/15 0600  Gross per 24 hour  Intake           681.25 ml  Output              525 ml  Net           156.25 ml   Filed Weights   10/27/15 1237  Weight: 93 kg (205 lb)    General appearance: alert, cooperative, appears stated age and no distress Resp: clear to auscultation bilaterally Cardio: regular rate and rhythm, S1, S2 normal, no murmur, click, rub or gallop GI: soft, non-tender; bowel sounds normal; no masses,  no organomegaly Extremities: extremities normal, atraumatic, no cyanosis or edema Pulses: 2+ and  symmetric Neurologic: Visually impaired. However, no focal neurological deficits are present.  Lab Results:  Data Reviewed: I have personally reviewed following labs and imaging studies  CBC:  Recent Labs Lab 10/27/15 1252 10/28/15 0558  WBC 21.3* 20.2*  HGB 13.3 12.0*  HCT 39.8 37.3*  MCV 83.4 83.3  PLT 203 924    Basic Metabolic Panel:  Recent Labs Lab 10/27/15 1252 10/28/15 0558  NA 135 138  K 4.5 4.1  CL 105 107  CO2 22 22  GLUCOSE 279* 148*  BUN 27* 23*  CREATININE 2.09* 1.80*  CALCIUM 8.7* 8.6*    GFR: Estimated Creatinine Clearance: 37.3 mL/min (by C-G formula based on SCr of 1.8 mg/dL (H)).  Liver Function Tests:  Recent Labs Lab 10/28/15 0558  AST 14*  ALT 16*  ALKPHOS 58  BILITOT 0.9  PROT 7.6  ALBUMIN 3.2*    CBG:  Recent Labs Lab 10/27/15 1356 10/28/15 0733  GLUCAP 282* 126*    Recent Results (from the past 240 hour(s))  MRSA PCR Screening     Status: None   Collection Time: 10/28/15  5:39 AM  Result Value Ref Range Status   MRSA by PCR NEGATIVE NEGATIVE Final    Comment:        The GeneXpert MRSA Assay (FDA approved for NASAL specimens only), is one component of a comprehensive MRSA colonization surveillance program.  It is not intended to diagnose MRSA infection nor to guide or monitor treatment for MRSA infections.       Radiology Studies: Dg Chest 2 View  Result Date: 10/27/2015 CLINICAL DATA:  Chest pain EXAM: CHEST  2 VIEW COMPARISON:  02/12/2005 FINDINGS: Cardiac shadow is stable. Postsurgical changes are again seen. No acute infiltrate or sizable effusion is noted. Degenerative changes of thoracic spine are seen. IMPRESSION: No active cardiopulmonary disease. Electronically Signed   By: [name] M.D.   On: 10/27/2015 14:02   US Renal  Result Date: 10/28/2015 CLINICAL DATA:  Renal failure. Elevated BUN and creatinine. History of diabetes and hypertension. Elevated PSA. EXAM: RENAL / URINARY TRACT ULTRASOUND  COMPLETE COMPARISON:  None. FINDINGS: Right Kidney: Length: 10.5 cm. Visualization is somewhat limited due to poor penetration. Parenchymal echotexture appears increased consistent with chronic medical renal disease. Hypoechoic lesion in the lower pole measuring 2.5 cm maximal diameter likely representing a cyst. No hydronephrosis. Left Kidney: Length: 9.7 cm. Diffuse parenchymal atrophy with prominent renal sinus fat. No focal lesion or hydronephrosis. Bladder: Bladder wall is not thickened. Layering debris demonstrated in the deep and bladder posteriorly. Bilaterally urine flow jets are identified on color flow Doppler imaging. Incidental note of prostate enlargement with prostate gland measuring 5 x 5.2 x 4.8 cm resulting in a calculated volume of 65 mL. IMPRESSION: Right renal cyst. Chronic parenchymal changes in both kidneys. No hydronephrosis. Layering debris in the bladder could be due to hemorrhage, stasis, or infection. Prostate gland is enlarged. Electronically Signed   By: Lucienne Capers M.D.   On: 10/28/2015 02:25     Medications:  Scheduled: . aspirin EC  81 mg Oral Daily  . cefTRIAXone (ROCEPHIN)  IV  1 g Intravenous Q24H  . enoxaparin (LOVENOX) injection  40 mg Subcutaneous Q24H  . finasteride  5 mg Oral Daily  . insulin aspart  0-9 Units Subcutaneous TID WC  . linaclotide  290 mcg Oral QAC breakfast  . losartan  50 mg Oral Daily  . metoprolol succinate  50 mg Oral Daily  . pravastatin  20 mg Oral q1800  . tamsulosin  0.4 mg Oral Daily   Continuous: . sodium chloride 30 mL/hr at 10/28/15 0945   ERX:[id number] **OR** acetaminophen  Assessment/Plan:  Active Problems:   Glaucoma associated with ocular disorder   DM (diabetes mellitus), type 2 with ophthalmic complications (HCC)   UTI (urinary tract infection)    Urinary tract infection with generalized weakness and fatigue Patient was quite symptomatic. His generalized weakness and fatigue is thought to be secondary  to urinary tract infection. He does not have any focal neurological deficits. Urine cultures are pending. Patient placed on ceftriaxone, which will be continued. PT and OT evaluation. No clear evidence for sepsis at the time of admission. WBC, however, remain significantly elevated.  Mild acute on chronic kidney disease stage III. Creatinine is noted to have improved this morning. Cut back on IV fluids. Renal ultrasound showed renal cysts without any evidence for hydronephrosis. Monitor urine output.  History of essential hypertension. Blood pressure was poorly controlled last night. He is on antihypertensive agents which will be continued. May need dose adjustments.  History of diabetes mellitus type 2. Holding his oral agents. Continue sliding scale insulin coverage. Monitor CBGs.  History of hyperlipidemia. Continue with statin.  History of BPH. Continue with Flomax and finasteride  DVT Prophylaxis: Lovenox    Code Status: DO NOT RESUSCITATE  Family Communication: No family at bedside. Discussed  in detail with patient.  Disposition Plan: Await urine cultures. Anticipate discharge tomorrow. PT/OT to mobilize patient.    LOS: 1 day   [facility] Pager [phone] 10/28/2015, 11:40 AM  If 7PM-7AM, please contact night-coverage at [email or web address], password [facility]

## 2015-10-29 LAB — CBC
HEMATOCRIT: 36.1 % — AB (ref 39.0–52.0)
HEMOGLOBIN: 11.8 g/dL — AB (ref 13.0–17.0)
MCH: 27.3 pg (ref 26.0–34.0)
MCHC: 32.7 g/dL (ref 30.0–36.0)
MCV: 83.4 fL (ref 78.0–100.0)
Platelets: 186 10*3/uL (ref 150–400)
RBC: 4.33 MIL/uL (ref 4.22–5.81)
RDW: 13.9 % (ref 11.5–15.5)
WBC: 16.1 10*3/uL — AB (ref 4.0–10.5)

## 2015-10-29 LAB — URINE CULTURE: Culture: >=100000 — AB

## 2015-10-29 LAB — BASIC METABOLIC PANEL
ANION GAP: 6 (ref 5–15)
BUN: 26 mg/dL — ABNORMAL HIGH (ref 6–20)
CALCIUM: 8.5 mg/dL — AB (ref 8.9–10.3)
CO2: 21 mmol/L — AB (ref 22–32)
CREATININE: 1.99 mg/dL — AB (ref 0.61–1.24)
Chloride: 110 mmol/L (ref 101–111)
GFR, EST AFRICAN AMERICAN: 36 mL/min — AB (ref >60)
GFR, EST NON AFRICAN AMERICAN: 31 mL/min — AB (ref >60)
Glucose, Bld: 99 mg/dL (ref 65–99)
Potassium: 3.8 mmol/L (ref 3.5–5.1)
SODIUM: 137 mmol/L (ref 135–145)

## 2015-10-29 LAB — GLUCOSE, CAPILLARY
GLUCOSE-CAPILLARY: 93 mg/dL (ref 65–99)
GLUCOSE-CAPILLARY: 241 mg/dL — AB (ref 65–99)

## 2015-10-29 LAB — HEMOGLOBIN A1C
HEMOGLOBIN A1C: 8.1 % — AB (ref 4.8–5.6)
MEAN PLASMA GLUCOSE: 186 mg/dL

## 2015-10-29 MED ORDER — CEFPODOXIME PROXETIL 200 MG PO TABS
200.0000 mg | ORAL_TABLET | Freq: Two times a day (BID) | ORAL | Status: DC
  Administered 2015-10-29: 200 mg via ORAL
  Filled 2015-10-29: qty 1

## 2015-10-29 MED ORDER — CEFPODOXIME PROXETIL 200 MG PO TABS: 200.0000 mg | ORAL_TABLET | Freq: Two times a day (BID) | ORAL | 0 refills | Status: DC

## 2015-10-29 NOTE — Care Management Note (Signed)
Case Management Note  Patient Details  Name: Caleb Shankel Sr. MRN: [id number] Date of Birth: [birth year]  Subjective/Objective:                  generalized weakness and difficulty with urinating Action/Plan: Discharge planning Expected Discharge Date:  10/29/15              Expected Discharge Plan:  [place]  In-House Referral:     Discharge planning Services  CM Consult  Post Acute Care Choice:  Home Health Choice offered to:  Patient  DME Arranged:  N/A DME Agency:  NA  HH Arranged:  PT [place] Agency:  [facility]  Status of Service:  Completed, signed off  If discussed at [place] of Stay Meetings, dates discussed:    Additional Comments: CM spoke with pt and family (per telephone as CM is working remotely) to offer choice of home health agency. Pt chooses [facility] to render HHPT.  Referral called to [facility] rep, Caleb Dunn with best contact number of [phone].  Pt states he does fine with cane and does not want a rollling walker.  No other CM needs were communicated. Dellie Catholic, RN 10/29/2015, 11:31 AM

## 2015-10-29 NOTE — Evaluation (Signed)
Physical Therapy One Time Evaluation Patient Details Name: Caleb Fahrner Sr. 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 All education has been completed and the patient has no further questions.  Pt reports feeling better however still fatigues quickly and reports generalized weakness with ambulation.  Recommended pt perform tasks as able with rest breaks frequently upon d/c.  Pt plans to have family check in on him. See below for any follow-up Physical Therapy or equipment needs. PT is signing off. Thank you for this referral.     Follow Up Recommendations Home health PT;Supervision - Intermittent    Equipment Recommendations  None recommended by PT    Recommendations for Other Services       Precautions / Restrictions Precautions Precautions: Fall Precaution Comments: legally blind      Mobility  Bed Mobility Overal bed mobility: Needs Assistance Bed Mobility: Supine to Sit     Supine to sit: Supervision     General bed mobility comments: verbal cues for environment  Transfers Overall transfer level: Needs assistance Equipment used: Straight cane Transfers: Sit to/from Stand Sit to Stand: Min guard         General transfer comment: min/guard for safety due to hx of blindness, pt slightly unsteady with rise however able to self correct  Ambulation/Gait Ambulation/Gait assistance: Min guard Ambulation Distance (Feet): 80 Feet Assistive device: Straight cane Gait Pattern/deviations: Step-through pattern;Decreased stride length     General  Gait Details: slow but steady gait, pt follows verbal directions well since outside of his familiar environment, distance limited by fatigue as pt reports feeling better however still generally weak  Stairs            Wheelchair Mobility    Modified Rankin (Stroke Patients Only)       Balance                                             Pertinent Vitals/Pain Pain Assessment: No/denies pain    Home Living Family/patient expects to be discharged to:: Private residence Living Arrangements: Alone Available Help at Discharge: Family;Available PRN/intermittently (family able to check in)   Home Access: Level entry     Home Layout: One level Home Equipment: Cane - single point      Prior Function Level of Independence: Independent with assistive device(s)               Hand Dominance        Extremity/Trunk Assessment               Lower Extremity Assessment: Generalized weakness      Cervical / Trunk Assessment: Normal  Communication   Communication: No difficulties  Cognition Arousal/Alertness: Awake/alert Behavior During Therapy: WFL for tasks assessed/performed Overall Cognitive Status: Within Functional Limits for tasks assessed                      General Comments      Exercises     Assessment/Plan    PT Assessment All  further PT needs can be met in the next venue of care  PT Problem List Decreased strength;Decreased activity tolerance;Decreased mobility          PT Treatment Interventions      PT Goals (Current goals can be found in the Care Plan section)  Acute Rehab PT Goals PT Goal Formulation: All assessment and education complete, DC therapy    Frequency     Barriers to discharge        Co-evaluation               End of Session Equipment Utilized During Treatment: Gait belt Activity Tolerance: Patient tolerated treatment well Patient left: with call bell/phone within reach;in bed  (sitting EOB, pt's son to stay with him) Nurse Communication: Mobility status         Time: 6438-3779 PT Time Calculation (min) (ACUTE ONLY): 18 min   Charges:   PT Evaluation $PT Eval Low Complexity: 1 Procedure     PT G Codes:        Caleb Dunn,KATHrine E 10/29/2015, 12:13 PM Carmelia Bake, PT, DPT 10/29/2015 Pager: [phone]

## 2015-10-29 NOTE — Discharge Summary (Signed)
[facility]  Physician Discharge Summary   Patient ID: Caleb Maudlin Sr. MRN: [id number] DOB/AGE: 1940/10/26 75 y.o.  Admit date: 10/27/2015 Discharge date: 10/29/2015  PCP: Caleb Kehr, MD  DISCHARGE DIAGNOSES:  Active Problems:   Glaucoma associated with ocular disorder   DM (diabetes mellitus), type 2 with ophthalmic complications ([place])   UTI (urinary tract infection)   RECOMMENDATIONS FOR OUTPATIENT FOLLOW UP: 1. Outpatient follow-up within a week with PCP 2. Please note that metformin has been discontinued due to his renal function. Please address at follow-up. 3. 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 He was found to have an abnormal UA. He was thought to have a urinary tract infection. Patient was noted to be quite fatigued and it was felt that it would be safe to get him hospitalized for further treatment.   HOSPITAL COURSE:   Urinary tract infection with generalized weakness and fatigue Patient was quite symptomatic. His generalized weakness and fatigue is thought to be secondary to urinary tract infection. He does not have any focal neurological deficits. Patient placed on ceftriaxone. Urine cultures were sent. No clear evidence for sepsis at the time of admission. WBC, however, remain significantly elevated. WBC is improved this morning. Urine culture is growing Klebsiella. Sensitivities reviewed. Patient will be discharged on Vantin. He feels much better this morning. Seen by physical therapy and home health has  been recommended.  Mild acute on chronic kidney disease stage III. Creatinine is at baseline. Renal ultrasound showed renal cysts without any evidence for hydronephrosis.   History of essential hypertension. Blood pressure remains somewhat elevated. We will defer further management of this to the primary care physician considering patient's advanced age. He is on antihypertensive agents which will be continued. May need dose adjustments.  History of diabetes mellitus type 2. Patient may continue taking glipizide. He is noted to be on metformin, however, due to his chronic kidney disease and low GFR, we will recommend discontinuing metformin. He should discuss this further with his PCP.   History of hyperlipidemia. Continue with statin.  History of BPH. Continue with Flomax and finasteride  History of glaucoma with bilateral blindness Stable.  Overall, stable. Patient is excited about going home. Home health will be ordered. Stable for discharge.   PERTINENT LABS:  The results of significant diagnostics from this hospitalization (including imaging, microbiology, ancillary and laboratory) are listed below for reference.    Microbiology: Recent Results (from the past 240 hour(s))  Urine culture     Status: Abnormal   Collection Time: 10/27/15 12:40 PM  Result Value Ref Range Status   Specimen Description URINE, RANDOM  Final   Special Requests NONE  Final   Culture >=100,000 COLONIES/mL KLEBSIELLA PNEUMONIAE (A)  Final   Report Status 10/29/2015 FINAL  Final   Organism ID, Bacteria KLEBSIELLA PNEUMONIAE (A)  Final      Susceptibility   Klebsiella pneumoniae - MIC*    AMPICILLIN 16 RESISTANT Resistant     CEFAZOLIN <=4 SENSITIVE Sensitive     CEFTRIAXONE <=1 SENSITIVE Sensitive     CIPROFLOXACIN <=0.25 SENSITIVE Sensitive     GENTAMICIN <=  1 SENSITIVE Sensitive     IMIPENEM <=0.25 SENSITIVE Sensitive     NITROFURANTOIN <=16 SENSITIVE Sensitive     TRIMETH/SULFA <=20  SENSITIVE Sensitive     AMPICILLIN/SULBACTAM 4 SENSITIVE Sensitive     PIP/TAZO <=4 SENSITIVE Sensitive     Extended ESBL NEGATIVE Sensitive     * >=100,000 COLONIES/mL KLEBSIELLA PNEUMONIAE  MRSA PCR Screening     Status: None   Collection Time: 10/28/15  5:39 AM  Result Value Ref Range Status   MRSA by PCR NEGATIVE NEGATIVE Final    Comment:        The GeneXpert MRSA Assay (FDA approved for NASAL specimens only), is one component of a comprehensive MRSA colonization surveillance program. It is not intended to diagnose MRSA infection nor to guide or monitor treatment for MRSA infections.      Labs: Basic Metabolic Panel:  Recent Labs Lab 10/27/15 1252 10/28/15 0558 10/29/15 0622  NA 135 138 137  K 4.5 4.1 3.8  CL 105 107 110  CO2 22 22 21*  GLUCOSE 279* 148* 99  BUN 27* 23* 26*  CREATININE 2.09* 1.80* 1.99*  CALCIUM 8.7* 8.6* 8.5*   Liver Function Tests:  Recent Labs Lab 10/28/15 0558  AST 14*  ALT 16*  ALKPHOS 58  BILITOT 0.9  PROT 7.6  ALBUMIN 3.2*   CBC:  Recent Labs Lab 10/27/15 1252 10/28/15 0558 10/29/15 0622  WBC 21.3* 20.2* 16.1*  HGB 13.3 12.0* 11.8*  HCT 39.8 37.3* 36.1*  MCV 83.4 83.3 83.4  PLT 203 185 186    CBG:  Recent Labs Lab 10/28/15 0733 10/28/15 1219 10/28/15 1742 10/28/15 2124 10/29/15 0754  GLUCAP 126* 136* 134* 142* 93     IMAGING STUDIES Dg Chest 2 View  Result Date: 10/27/2015 CLINICAL DATA:  Chest pain EXAM: CHEST  2 VIEW COMPARISON:  02/12/2005 FINDINGS: Cardiac shadow is stable. Postsurgical changes are again seen. No acute infiltrate or sizable effusion is noted. Degenerative changes of thoracic spine are seen. IMPRESSION: No active cardiopulmonary disease. Electronically Signed   By: [name] M.D.   On: 10/27/2015 14:02   US Renal  Result Date: 10/28/2015 CLINICAL DATA:  Renal failure. Elevated BUN and creatinine. History of diabetes and hypertension. Elevated PSA. EXAM: RENAL / URINARY TRACT  ULTRASOUND COMPLETE COMPARISON:  None. FINDINGS: Right Kidney: Length: 10.5 cm. Visualization is somewhat limited due to poor penetration. Parenchymal echotexture appears increased consistent with chronic medical renal disease. Hypoechoic lesion in the lower pole measuring 2.5 cm maximal diameter likely representing a cyst. No hydronephrosis. Left Kidney: Length: 9.7 cm. Diffuse parenchymal atrophy with prominent renal sinus fat. No focal lesion or hydronephrosis. Bladder: Bladder wall is not thickened. Layering debris demonstrated in the deep and bladder posteriorly. Bilaterally urine flow jets are identified on color flow Doppler imaging. Incidental note of prostate enlargement with prostate gland measuring 5 x 5.2 x 4.8 cm resulting in a calculated volume of 65 mL. IMPRESSION: Right renal cyst. Chronic parenchymal changes in both kidneys. No hydronephrosis. Layering debris in the bladder could be due to hemorrhage, stasis, or infection. Prostate gland is enlarged. Electronically Signed   By: Lucienne Capers M.D.   On: 10/28/2015 02:25    DISCHARGE EXAMINATION: Vitals:   10/28/15 0540 10/28/15 1456 10/28/15 2115 10/29/15 0429  BP: (!) 153/63 (!) 168/66 (!) 148/64 (!) 165/75  Pulse: 67 82 79 78  Resp: 17 18 18 18   Temp: 98 F (36.7 C) 99.8 F (37.7 C) 98.3 F (36.8  C) 98.2 F (36.8 C)  TempSrc: Oral Oral Oral Oral  SpO2: 100% 100% 97% 98%  Weight:      Height:       General appearance: alert, cooperative, appears stated age and no distress Resp: clear to auscultation bilaterally Cardio: regular rate and rhythm, S1, S2 normal, no murmur, click, rub or gallop GI: soft, non-tender; bowel sounds normal; no masses,  no organomegaly Extremities: extremities normal, atraumatic, no cyanosis or edema   DISPOSITION: Home with home health. His son will transport him.  Discharge Instructions    Call MD for:  difficulty breathing, headache or visual disturbances    Complete by:  As directed     Call MD for:  hives    Complete by:  As directed    Call MD for:  persistant dizziness or light-headedness    Complete by:  As directed    Call MD for:  persistant nausea and vomiting    Complete by:  As directed    Call MD for:  severe uncontrolled pain    Complete by:  As directed    Call MD for:  temperature >100.4    Complete by:  As directed    Diet Carb Modified    Complete by:  As directed    Discharge instructions    Complete by:  As directed    Metformin should not be taken by certain patients with kidney problems. Please talk to your doctor before resuming it. Also talk to your Doctor about your high blood pressure and if  Medication dose need to be changed.  You were cared for by a hospitalist during your hospital stay. If you have any questions about your discharge medications or the care you received while you were in the hospital after you are discharged, you can call the unit and asked to speak with the hospitalist on call if the hospitalist that took care of you is not available. Once you are discharged, your primary care physician will handle any further medical issues. Please note that NO REFILLS for any discharge medications will be authorized once you are discharged, as it is imperative that you return to your primary care physician (or establish a relationship with a primary care physician if you do not have one) for your aftercare needs so that they can reassess your need for medications and monitor your lab values. If you do not have a primary care physician, you can call [phone] for a physician referral.   Increase activity slowly    Complete by:  As directed       ALLERGIES:  Allergies  Allergen Reactions  . Benazepril Hcl     REACTION: cough  . Verapamil     hallucinations     Current Discharge Medication List    START taking these medications   Details  cefpodoxime (VANTIN) 200 MG tablet Take 1 tablet (200 mg total) by mouth every 12 (twelve) hours. For  7 days Qty: 14 tablet, Refills: 0      CONTINUE these medications which have NOT CHANGED   Details  aspirin 81 MG tablet Take 1 tablet (81 mg total) by mouth daily. Qty: 100 tablet, Refills: 3    Blood Glucose Monitoring Suppl (PRODIGY BLOOD GLUCOSE MONITOR) DEVI As dirrected Qty: 1 each, Refills: 0    cholecalciferol (VITAMIN D) 1000 UNITS tablet Take 1 tablet (1,000 Units total) by mouth daily. Qty: 100 tablet, Refills: 3    finasteride (PROSCAR) 5 MG tablet Take 1  tablet (5 mg total) by mouth daily. For Prostate Qty: 90 tablet, Refills: 3    glimepiride (AMARYL) 2 MG tablet TAKE 1 TABLET TWICE DAILY FOR DIABETES Qty: 180 tablet, Refills: 2    glucose blood test strip 1 each by Other route 2 (two) times daily. Use to check blood sugars twice a day Dx E11.8 Qty: 300 each, Refills: 3    losartan (COZAAR) 50 MG tablet Take 1 tablet (50 mg total) by mouth daily. Qty: 90 tablet, Refills: 2    lovastatin (MEVACOR) 20 MG tablet Take 1 tablet (20 mg total) by mouth daily. For Cholesterol Qty: 90 tablet, Refills: 3    metoprolol succinate (TOPROL-XL) 50 MG 24 hr tablet Take 1 tablet (50 mg total) by mouth daily. For Blood Pressure Qty: 90 tablet, Refills: 2    PRODIGY LANCETS 28G MISC 28 g by Other route 2 (two) times daily. Use to help check blood sugars twice a day Dx E11.8 Qty: 300 each, Refills: 3    FLUoxetine (PROZAC) 10 MG tablet Take 1 tablet (10 mg total) by mouth daily. Qty: 30 tablet, Refills: 1    Linaclotide (LINZESS) 290 MCG CAPS capsule Take 1 capsule (290 mcg total) by mouth daily. Qty: 30 capsule, Refills: 11      STOP taking these medications     metFORMIN (GLUCOPHAGE) 500 MG tablet      gabapentin (NEURONTIN) 300 MG capsule      levocetirizine (XYZAL) 5 MG tablet      omeprazole (PRILOSEC) 20 MG capsule      oxybutynin (DITROPAN) 5 MG tablet      pantoprazole (PROTONIX) 40 MG tablet      tamsulosin (FLOMAX) 0.4 MG CAPS capsule      traMADol  (ULTRAM) 50 MG tablet      triamcinolone ointment (KENALOG) 0.5 %      valACYclovir (VALTREX) 1000 MG tablet          Follow-up Information    Caleb Kehr, MD Follow up in 4 day(s).   Specialty:  Internal Medicine Contact information: [place] [address] [phone]        [place] .   Why:  home health physical therapy; you will receive a call from this agency to schedule your at home physical therapy Contact information: [place] [address] [phone]           TOTAL DISCHARGE TIME: 35 minutes  [facility] Pager [phone]  10/29/2015, 11:53 AM

## 2015-10-29 NOTE — Discharge Instructions (Signed)

## 2015-10-29 NOTE — Progress Notes (Signed)
Pt leaving this afternoon with his son. Alert, oriented, and without c/o. Discharge instructions/prescriptions given/explained with pt verbalizing understanding. Pt alert and oriented. Left with cane. HH PT has been set up with CM and pt's son.

## 2015-11-01 ENCOUNTER — Other Ambulatory Visit (INDEPENDENT_AMBULATORY_CARE_PROVIDER_SITE_OTHER): Payer: Commercial Managed Care - HMO

## 2015-11-01 ENCOUNTER — Ambulatory Visit (INDEPENDENT_AMBULATORY_CARE_PROVIDER_SITE_OTHER): Payer: Commercial Managed Care - HMO | Admitting: Nurse Practitioner

## 2015-11-01 ENCOUNTER — Encounter: Payer: Self-pay | Admitting: Nurse Practitioner

## 2015-11-01 VITALS — BP 132/80 | Temp 97.6°F | Ht 66.0 in | Wt 201.0 lb

## 2015-11-01 DIAGNOSIS — N184 Chronic kidney disease, stage 4 (severe): Secondary | ICD-10-CM | POA: Diagnosis not present

## 2015-11-01 DIAGNOSIS — N390 Urinary tract infection, site not specified: Secondary | ICD-10-CM

## 2015-11-01 DIAGNOSIS — I119 Hypertensive heart disease without heart failure: Secondary | ICD-10-CM

## 2015-11-01 DIAGNOSIS — K5900 Constipation, unspecified: Secondary | ICD-10-CM

## 2015-11-01 DIAGNOSIS — E1139 Type 2 diabetes mellitus with other diabetic ophthalmic complication: Secondary | ICD-10-CM | POA: Diagnosis not present

## 2015-11-01 LAB — CBC WITH DIFFERENTIAL/PLATELET
BASOS ABS: 0 10*3/uL (ref 0.0–0.1)
Basophils Relative: 0.2 % (ref 0.0–3.0)
EOS PCT: 2.9 % (ref 0.0–5.0)
Eosinophils Absolute: 0.3 10*3/uL (ref 0.0–0.7)
HEMATOCRIT: 40.2 % (ref 39.0–52.0)
HEMOGLOBIN: 13 g/dL (ref 13.0–17.0)
LYMPHS PCT: 20.2 % (ref 12.0–46.0)
Lymphs Abs: 1.8 10*3/uL (ref 0.7–4.0)
MCHC: 32.5 g/dL (ref 30.0–36.0)
MCV: 82.4 fl (ref 78.0–100.0)
MONOS PCT: 6.9 % (ref 3.0–12.0)
Monocytes Absolute: 0.6 10*3/uL (ref 0.1–1.0)
Neutro Abs: 6.2 10*3/uL (ref 1.4–7.7)
Neutrophils Relative %: 69.8 % (ref 43.0–77.0)
Platelets: 240 10*3/uL (ref 150.0–400.0)
RBC: 4.88 Mil/uL (ref 4.22–5.81)
RDW: 13.9 % (ref 11.5–15.5)
WBC: 8.8 10*3/uL (ref 4.0–10.5)

## 2015-11-01 MED ORDER — SENNOSIDES-DOCUSATE SODIUM 8.6-50 MG PO TABS: 1.0000 | ORAL_TABLET | Freq: Every evening | ORAL | 1 refills | Status: DC | PRN

## 2015-11-01 MED ORDER — POLYETHYLENE GLYCOL 3350 17 G PO PACK: 17.0000 g | PACK | Freq: Every day | ORAL | 5 refills | Status: DC

## 2015-11-01 MED ORDER — SITAGLIPTIN PHOSPHATE 50 MG PO TABS: 50.0000 mg | ORAL_TABLET | Freq: Every day | ORAL | 3 refills | Status: DC

## 2015-11-01 NOTE — Patient Instructions (Addendum)
Maintain upcoming appt with Dr. Alain Marion. Encourage adequate oral hydration.  For constipation. Take miralax and senna-ducosate daily till bowel movements become regular. Stop medications if develops diarrhea.  Start Januvia for diabetes Do not take metformin.  continue to check blood sugar twice a day. (before breakfast and at bedtime).  Go to basement for blood draw and urine sample.

## 2015-11-01 NOTE — Progress Notes (Signed)
Subjective:  Patient ID: Caleb Nim Sr., male    DOB: 08-May-1940  Age: 75 y.o. MRN: [id number]  CC: Urinary Tract Infection (follow up Pt stated when urinating still have burning/pain)   Caleb Drewes Sr. presents for hospital follow up.  HPI  [name] is in office today with his daughter. He is legally blind. Patient discharged from hospital 10/29/15.  He was hospitalized with generalized weakness and fatigue secondary to UTI caused by klebsiella. He was treated with Rocephin IV and discharged home with [facility]. Worsened kidney disease (acute on chronic kidney disease) was also noted during hospitalization, hence metformin was discontinued. He was also discharged with home health PT and OT due to generalized weakness. I reviewed Hospital Lab and radiology reports.  UTI: Currently taking Vantin as prescribed, denies any N/V/D, no fever. Normal appetite. still has urinary frequency. Taking flomax and finesteride as precribed.  DM: Home glucose check are 200s, no higher than 270s. Metformin still on hold.  Daughter states, home PT and OT has not started. He stated he does not need home health (RN or CNA) at this time.   Constipation: Has hard stools since hospitalization, no home remedy used at this time. Since he is legally blind, he is unable to tell color of stool. He states he does not need any assist in the bathroom.  Outpatient Medications Prior to Visit  Medication Sig Dispense Refill  . aspirin 81 MG tablet Take 1 tablet (81 mg total) by mouth daily. 100 tablet 3  . Blood Glucose Monitoring Suppl (PRODIGY BLOOD GLUCOSE MONITOR) DEVI As dirrected 1 each 0  . cefpodoxime (VANTIN) 200 MG tablet Take 1 tablet (200 mg total) by mouth every 12 (twelve) hours. For 7 days 14 tablet 0  . cholecalciferol (VITAMIN D) 1000 UNITS tablet Take 1 tablet (1,000 Units total) by mouth daily. 100 tablet 3  . finasteride (PROSCAR) 5 MG tablet Take 1 tablet (5 mg total) by  mouth daily. For Prostate 90 tablet 3  . FLUoxetine (PROZAC) 10 MG tablet Take 1 tablet (10 mg total) by mouth daily. 30 tablet 1  . glimepiride (AMARYL) 2 MG tablet TAKE 1 TABLET TWICE DAILY FOR DIABETES 180 tablet 2  . glucose blood test strip 1 each by Other route 2 (two) times daily. Use to check blood sugars twice a day Dx E11.8 300 each 3  . Linaclotide (LINZESS) 290 MCG CAPS capsule Take 1 capsule (290 mcg total) by mouth daily. 30 capsule 11  . losartan (COZAAR) 50 MG tablet Take 1 tablet (50 mg total) by mouth daily. 90 tablet 2  . lovastatin (MEVACOR) 20 MG tablet Take 1 tablet (20 mg total) by mouth daily. For Cholesterol 90 tablet 3  . metoprolol succinate (TOPROL-XL) 50 MG 24 hr tablet Take 1 tablet (50 mg total) by mouth daily. For Blood Pressure 90 tablet 2  . PRODIGY LANCETS 28G MISC 28 g by Other route 2 (two) times daily. Use to help check blood sugars twice a day Dx E11.8 300 each 3   No facility-administered medications prior to visit.     ROS See HPI  Objective:  BP 132/80 (BP Location: Left Arm, Patient Position: Sitting, Cuff Size: Normal)   Temp 97.6 F (36.4 C)   Ht '5\' 6"'  (1.676 m)   Wt 201 lb (91.2 kg)   SpO2 98%   BMI 32.44 kg/m   BP Readings from Last 3 Encounters:  11/01/15 132/80  10/29/15 (!) 165/75  10/03/15 140/80  Wt Readings from Last 3 Encounters:  11/01/15 201 lb (91.2 kg)  10/27/15 205 lb (93 kg)  10/03/15 205 lb (93 kg)    Physical Exam  Constitutional: He is oriented to person, place, and time. No distress.  Neck: Normal range of motion. Neck supple.  Cardiovascular: Normal rate, regular rhythm and normal heart sounds.   Pulmonary/Chest: Effort normal and breath sounds normal.  Abdominal: Soft. Bowel sounds are normal. He exhibits no distension. There is no tenderness.  Musculoskeletal: He exhibits no edema.  Neurological: He is alert and oriented to person, place, and time.  Legally blind. Ambulates with cane.  Skin: Skin is  warm and dry.  Psychiatric: He has a normal mood and affect. His behavior is normal.  Vitals reviewed.   Lab Results  Component Value Date   WBC 16.1 (H) 10/29/2015   HGB 11.8 (L) 10/29/2015   HCT 36.1 (L) 10/29/2015   PLT 186 10/29/2015   GLUCOSE 99 10/29/2015   CHOL 128 04/27/2013   TRIG 87.0 04/27/2013   HDL 41.60 04/27/2013   LDLCALC 69 04/27/2013   ALT 16 (L) 10/28/2015   AST 14 (L) 10/28/2015   NA 137 10/29/2015   K 3.8 10/29/2015   CL 110 10/29/2015   CREATININE 1.99 (H) 10/29/2015   BUN 26 (H) 10/29/2015   CO2 21 (L) 10/29/2015   TSH 0.80 08/13/2011   PSA 8.93 (H) 11/23/2014   HGBA1C 8.1 (H) 10/27/2015    Dg Chest 2 View  Result Date: 10/27/2015 CLINICAL DATA:  Chest pain EXAM: CHEST  2 VIEW COMPARISON:  02/12/2005 FINDINGS: Cardiac shadow is stable. Postsurgical changes are again seen. No acute infiltrate or sizable effusion is noted. Degenerative changes of thoracic spine are seen. IMPRESSION: No active cardiopulmonary disease. Electronically Signed   By: [name] M.D.   On: 10/27/2015 14:02   US Renal  Result Date: 10/28/2015 CLINICAL DATA:  Renal failure. Elevated BUN and creatinine. History of diabetes and hypertension. Elevated PSA. EXAM: RENAL / URINARY TRACT ULTRASOUND COMPLETE COMPARISON:  None. FINDINGS: Right Kidney: Length: 10.5 cm. Visualization is somewhat limited due to poor penetration. Parenchymal echotexture appears increased consistent with chronic medical renal disease. Hypoechoic lesion in the lower pole measuring 2.5 cm maximal diameter likely representing a cyst. No hydronephrosis. Left Kidney: Length: 9.7 cm. Diffuse parenchymal atrophy with prominent renal sinus fat. No focal lesion or hydronephrosis. Bladder: Bladder wall is not thickened. Layering debris demonstrated in the deep and bladder posteriorly. Bilaterally urine flow jets are identified on color flow Doppler imaging. Incidental note of prostate enlargement with prostate gland  measuring 5 x 5.2 x 4.8 cm resulting in a calculated volume of 65 mL. IMPRESSION: Right renal cyst. Chronic parenchymal changes in both kidneys. No hydronephrosis. Layering debris in the bladder could be due to hemorrhage, stasis, or infection. Prostate gland is enlarged. Electronically Signed   By: Lucienne Capers M.D.   On: 10/28/2015 02:25    Assessment & Plan:   Sulaiman was seen today for urinary tract infection.  Diagnoses and all orders for this visit:  Urinary tract infection without hematuria, site unspecified -     CBC w/Diff; Future -     Urinalysis, Routine w reflex microscopic; Future  Type 2 diabetes mellitus with other ophthalmic complication, without long-term current use of insulin (HCC) -     sitaGLIPtin (JANUVIA) 50 MG tablet; Take 1 tablet (50 mg total) by mouth daily.  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 -     senna-docusate (SENOKOT-S) 8.6-50 MG tablet; Take 1 tablet by mouth at bedtime as needed for mild constipation.   I am having Mr. Echeverri start on sitaGLIPtin, polyethylene glycol, and senna-docusate. I am also having him maintain his PRODIGY BLOOD GLUCOSE MONITOR, glucose blood, PRODIGY LANCETS 28G, cholecalciferol, aspirin, linaclotide, finasteride, FLUoxetine, metoprolol succinate, losartan, glimepiride, lovastatin, and cefpodoxime.  Meds ordered this encounter  Medications  . sitaGLIPtin (JANUVIA) 50 MG tablet    Sig: Take 1 tablet (50 mg total) by mouth daily.    Dispense:  30 tablet    Refill:  3    Order Specific Question:   Supervising Provider    Answer:   Cassandria Anger [[id number]]  . polyethylene glycol (MIRALAX / GLYCOLAX) packet    Sig: Take 17 g by mouth daily.      Dispense:  30 each    Refill:  5    Order Specific Question:   Supervising Provider    Answer:   Cassandria Anger [[id number]]  . senna-docusate (SENOKOT-S) 8.6-50 MG tablet    Sig: Take 1 tablet by mouth at bedtime as needed for mild constipation.    Dispense:  30 tablet    Refill:  1    Order Specific Question:   Supervising Provider    Answer:   Cassandria Anger [[id number]]   Recent Results (from the past 2160 hour(s))  Basic metabolic panel     Status: Abnormal   Collection Time: 09/30/15  9:02 AM  Result Value Ref Range   Sodium 138 135 - 145 mEq/L   Potassium 4.4 3.5 - 5.1 mEq/L   Chloride 104 96 - 112 mEq/L   CO2 27 19 - 32 mEq/L   Glucose, Bld 174 (H) 70 - 99 mg/dL   BUN 24 (H) 6 - 23 mg/dL   Creatinine, Ser 2.10 (H) 0.40 - 1.50 mg/dL   Calcium 9.2 8.4 - 10.5 mg/dL   GFR 39.68 (L) >60.00 mL/min  Hemoglobin A1c     Status: Abnormal   Collection Time: 09/30/15  9:02 AM  Result Value Ref Range   Hgb A1c MFr Bld 7.9 (H) 4.6 - 6.5 %    Comment: Glycemic Control Guidelines for People with Diabetes:Non Diabetic:  <6%Goal of Therapy: <7%Additional Action Suggested:  >8%   Urinalysis, Routine w reflex microscopic     Status: Abnormal   Collection Time: 10/27/15 12:40 PM  Result Value Ref Range   Color, Urine YELLOW YELLOW   APPearance CLOUDY (A) CLEAR   Specific Gravity, Urine 1.035 (H) 1.005 - 1.030   pH 5.5 5.0 - 8.0   Glucose, UA >1000 (A) NEGATIVE mg/dL   Hgb urine dipstick MODERATE (A) NEGATIVE   Bilirubin Urine SMALL (A) NEGATIVE   Ketones, ur NEGATIVE NEGATIVE mg/dL   Protein, ur >300 (A) NEGATIVE mg/dL   Nitrite NEGATIVE NEGATIVE   Leukocytes, UA NEGATIVE NEGATIVE  Urine microscopic-add on     Status: Abnormal   Collection Time: 10/27/15 12:40 PM  Result Value Ref Range   Squamous Epithelial / LPF 0-5 (A) NONE SEEN   WBC, UA TOO NUMEROUS TO COUNT 0 - 5 WBC/hpf   RBC / HPF 6-30 0 - 5 RBC/hpf  Bacteria, UA MANY (A) NONE SEEN  Urine culture     Status: Abnormal    Collection Time: 10/27/15 12:40 PM  Result Value Ref Range   Specimen Description URINE, RANDOM    Special Requests NONE    Culture >=100,000 COLONIES/mL KLEBSIELLA PNEUMONIAE (A)    Report Status 10/29/2015 FINAL    Organism ID, Bacteria KLEBSIELLA PNEUMONIAE (A)       Susceptibility   Klebsiella pneumoniae - MIC*    AMPICILLIN 16 RESISTANT Resistant     CEFAZOLIN <=4 SENSITIVE Sensitive     CEFTRIAXONE <=1 SENSITIVE Sensitive     CIPROFLOXACIN <=0.25 SENSITIVE Sensitive     GENTAMICIN <=1 SENSITIVE Sensitive     IMIPENEM <=0.25 SENSITIVE Sensitive     NITROFURANTOIN <=16 SENSITIVE Sensitive     TRIMETH/SULFA <=20 SENSITIVE Sensitive     AMPICILLIN/SULBACTAM 4 SENSITIVE Sensitive     PIP/TAZO <=4 SENSITIVE Sensitive     Extended ESBL NEGATIVE Sensitive     * >=100,000 COLONIES/mL KLEBSIELLA PNEUMONIAE  Basic metabolic panel     Status: Abnormal   Collection Time: 10/27/15 12:52 PM  Result Value Ref Range   Sodium 135 135 - 145 mmol/L   Potassium 4.5 3.5 - 5.1 mmol/L   Chloride 105 101 - 111 mmol/L   CO2 22 22 - 32 mmol/L   Glucose, Bld 279 (H) 65 - 99 mg/dL   BUN 27 (H) 6 - 20 mg/dL   Creatinine, Ser 2.09 (H) 0.61 - 1.24 mg/dL   Calcium 8.7 (L) 8.9 - 10.3 mg/dL   GFR calc non Af Amer 29 (L) >60 mL/min   GFR calc Af Amer 34 (L) >60 mL/min    Comment: (NOTE) The eGFR has been calculated using the CKD EPI equation. This calculation has not been validated in all clinical situations. eGFR's persistently <60 mL/min signify possible Chronic Kidney Disease.    Anion gap 8 5 - 15  CBC     Status: Abnormal   Collection Time: 10/27/15 12:52 PM  Result Value Ref Range   WBC 21.3 (H) 4.0 - 10.5 K/uL   RBC 4.77 4.22 - 5.81 MIL/uL   Hemoglobin 13.3 13.0 - 17.0 g/dL   HCT 39.8 39.0 - 52.0 %   MCV 83.4 78.0 - 100.0 fL   MCH 27.9 26.0 - 34.0 pg   MCHC 33.4 30.0 - 36.0 g/dL   RDW 13.7 11.5 - 15.5 %   Platelets 203 150 - 400 K/uL  Hemoglobin A1c     Status: Abnormal   Collection  Time: 10/27/15 12:52 PM  Result Value Ref Range   Hgb A1c MFr Bld 8.1 (H) 4.8 - 5.6 %    Comment: (NOTE)         Pre-diabetes: 5.7 - 6.4         Diabetes: >6.4         Glycemic control for adults with diabetes: <7.0    Mean Plasma Glucose 186 mg/dL    Comment: (NOTE) Performed At: [facility] [address] [place], [place] [id number] Lindon Romp MD LJ:[id number]   I-stat troponin, ED     Status: None   Collection Time: 10/27/15  1:05 PM  Result Value Ref Range   Troponin i, poc 0.00 0.00 - 0.08 ng/mL   Comment 3            Comment: Due to the release kinetics of cTnI, a negative result within the first hours of the onset of symptoms does not rule  out myocardial infarction with certainty. If myocardial infarction is still suspected, repeat the test at appropriate intervals.   CBG monitoring, ED     Status: Abnormal   Collection Time: 10/27/15  1:56 PM  Result Value Ref Range   Glucose-Capillary 282 (H) 65 - 99 mg/dL  MRSA PCR Screening     Status: None   Collection Time: 10/28/15  5:39 AM  Result Value Ref Range   MRSA by PCR NEGATIVE NEGATIVE    Comment:        The GeneXpert MRSA Assay (FDA approved for NASAL specimens only), is one component of a comprehensive MRSA colonization surveillance program. It is not intended to diagnose MRSA infection nor to guide or monitor treatment for MRSA infections.   CBC     Status: Abnormal   Collection Time: 10/28/15  5:58 AM  Result Value Ref Range   WBC 20.2 (H) 4.0 - 10.5 K/uL   RBC 4.48 4.22 - 5.81 MIL/uL   Hemoglobin 12.0 (L) 13.0 - 17.0 g/dL   HCT 37.3 (L) 39.0 - 52.0 %   MCV 83.3 78.0 - 100.0 fL   MCH 26.8 26.0 - 34.0 pg   MCHC 32.2 30.0 - 36.0 g/dL   RDW 13.8 11.5 - 15.5 %   Platelets 185 150 - 400 K/uL  Comprehensive metabolic panel     Status: Abnormal   Collection Time: 10/28/15  5:58 AM  Result Value Ref Range   Sodium 138 135 - 145 mmol/L   Potassium 4.1 3.5 - 5.1 mmol/L   Chloride 107 101 -  111 mmol/L   CO2 22 22 - 32 mmol/L   Glucose, Bld 148 (H) 65 - 99 mg/dL   BUN 23 (H) 6 - 20 mg/dL   Creatinine, Ser 1.80 (H) 0.61 - 1.24 mg/dL   Calcium 8.6 (L) 8.9 - 10.3 mg/dL   Total Protein 7.6 6.5 - 8.1 g/dL   Albumin 3.2 (L) 3.5 - 5.0 g/dL   AST 14 (L) 15 - 41 U/L   ALT 16 (L) 17 - 63 U/L   Alkaline Phosphatase 58 38 - 126 U/L   Total Bilirubin 0.9 0.3 - 1.2 mg/dL   GFR calc non Af Amer 35 (L) >60 mL/min   GFR calc Af Amer 40 (L) >60 mL/min    Comment: (NOTE) The eGFR has been calculated using the CKD EPI equation. This calculation has not been validated in all clinical situations. eGFR's persistently <60 mL/min signify possible Chronic Kidney Disease.    Anion gap 9 5 - 15  Glucose, capillary     Status: Abnormal   Collection Time: 10/28/15  7:33 AM  Result Value Ref Range   Glucose-Capillary 126 (H) 65 - 99 mg/dL  Glucose, capillary     Status: Abnormal   Collection Time: 10/28/15 12:19 PM  Result Value Ref Range   Glucose-Capillary 136 (H) 65 - 99 mg/dL  Glucose, capillary     Status: Abnormal   Collection Time: 10/28/15  5:42 PM  Result Value Ref Range   Glucose-Capillary 134 (H) 65 - 99 mg/dL   Comment 1 Notify RN    Comment 2 Document in Chart   Glucose, capillary     Status: Abnormal   Collection Time: 10/28/15  9:24 PM  Result Value Ref Range   Glucose-Capillary 142 (H) 65 - 99 mg/dL  CBC     Status: Abnormal   Collection Time: 10/29/15  6:22 AM  Result Value Ref Range   WBC 16.1 (  H) 4.0 - 10.5 K/uL   RBC 4.33 4.22 - 5.81 MIL/uL   Hemoglobin 11.8 (L) 13.0 - 17.0 g/dL   HCT 36.1 (L) 39.0 - 52.0 %   MCV 83.4 78.0 - 100.0 fL   MCH 27.3 26.0 - 34.0 pg   MCHC 32.7 30.0 - 36.0 g/dL   RDW 13.9 11.5 - 15.5 %   Platelets 186 150 - 400 K/uL  Basic metabolic panel     Status: Abnormal   Collection Time: 10/29/15  6:22 AM  Result Value Ref Range   Sodium 137 135 - 145 mmol/L   Potassium 3.8 3.5 - 5.1 mmol/L   Chloride 110 101 - 111 mmol/L   CO2 21 (L) 22 -  32 mmol/L   Glucose, Bld 99 65 - 99 mg/dL   BUN 26 (H) 6 - 20 mg/dL   Creatinine, Ser 1.99 (H) 0.61 - 1.24 mg/dL   Calcium 8.5 (L) 8.9 - 10.3 mg/dL   GFR calc non Af Amer 31 (L) >60 mL/min   GFR calc Af Amer 36 (L) >60 mL/min    Comment: (NOTE) The eGFR has been calculated using the CKD EPI equation. This calculation has not been validated in all clinical situations. eGFR's persistently <60 mL/min signify possible Chronic Kidney Disease.    Anion gap 6 5 - 15  Glucose, capillary     Status: None   Collection Time: 10/29/15  7:54 AM  Result Value Ref Range   Glucose-Capillary 93 65 - 99 mg/dL   Comment 1 Notify RN   Glucose, capillary     Status: Abnormal   Collection Time: 10/29/15 12:00 PM  Result Value Ref Range   Glucose-Capillary 241 (H) 65 - 99 mg/dL  CBC w/Diff     Status: None   Collection Time: 11/01/15  5:09 PM  Result Value Ref Range   WBC 8.8 4.0 - 10.5 K/uL   RBC 4.88 4.22 - 5.81 Mil/uL   Hemoglobin 13.0 13.0 - 17.0 g/dL   HCT 40.2 39.0 - 52.0 %   MCV 82.4 78.0 - 100.0 fl   MCHC 32.5 30.0 - 36.0 g/dL   RDW 13.9 11.5 - 15.5 %   Platelets 240.0 150.0 - 400.0 K/uL   Neutrophils Relative % 69.8 43.0 - 77.0 %   Lymphocytes Relative 20.2 12.0 - 46.0 %   Monocytes Relative 6.9 3.0 - 12.0 %   Eosinophils Relative 2.9 0.0 - 5.0 %   Basophils Relative 0.2 0.0 - 3.0 %   Neutro Abs 6.2 1.4 - 7.7 K/uL   Lymphs Abs 1.8 0.7 - 4.0 K/uL   Monocytes Absolute 0.6 0.1 - 1.0 K/uL   Eosinophils Absolute 0.3 0.0 - 0.7 K/uL   Basophils Absolute 0.0 0.0 - 0.1 K/uL  Basic Metabolic Panel (BMET)     Status: Abnormal   Collection Time: 11/01/15  5:09 PM  Result Value Ref Range   Sodium 137 135 - 145 mEq/L   Potassium 4.6 3.5 - 5.1 mEq/L   Chloride 105 96 - 112 mEq/L   CO2 24 19 - 32 mEq/L   Glucose, Bld 151 (H) 70 - 99 mg/dL   BUN 26 (H) 6 - 23 mg/dL   Creatinine, Ser 2.02 (H) 0.40 - 1.50 mg/dL   Calcium 9.2 8.4 - 10.5 mg/dL   GFR 41.49 (L) >60.00 mL/min  Urinalysis, Routine w  reflex microscopic     Status: Abnormal   Collection Time: 11/01/15  5:09 PM  Result Value Ref Range  Color, Urine YELLOW Yellow;Lt. Yellow   APPearance Cloudy (A) Clear   Specific Gravity, Urine >=1.030 (A) 1.000 - 1.030   pH 5.5 5.0 - 8.0   Total Protein, Urine 100 (A) Negative   Urine Glucose NEGATIVE Negative   Ketones, ur TRACE (A) Negative   Bilirubin Urine NEGATIVE Negative   Hgb urine dipstick SMALL (A) Negative   Urobilinogen, UA 0.2 0.0 - 1.0   Leukocytes, UA NEGATIVE Negative   Nitrite NEGATIVE Negative   WBC, UA 3-6/hpf (A) 0-2/hpf   RBC / HPF none seen 0-2/hpf   Squamous Epithelial / LPF Rare(0-4/hpf) Rare(0-4/hpf)    Follow-up: Return if symptoms worsen or fail to improve.  Wilfred Lacy, NP

## 2015-11-02 LAB — URINALYSIS, ROUTINE W REFLEX MICROSCOPIC
Bilirubin Urine: NEGATIVE
Leukocytes, UA: NEGATIVE
Nitrite: NEGATIVE
RBC / HPF: NONE SEEN (ref 0–?)
Specific Gravity, Urine: >=1.03 — AB (ref 1.000–1.030)
Total Protein, Urine: 100 — AB
Urine Glucose: NEGATIVE
Urobilinogen, UA: 0.2 (ref 0.0–1.0)
pH: 5.5 (ref 5.0–8.0)

## 2015-11-02 LAB — BASIC METABOLIC PANEL
BUN: 26 mg/dL — ABNORMAL HIGH (ref 6–23)
CO2: 24 mEq/L (ref 19–32)
Calcium: 9.2 mg/dL (ref 8.4–10.5)
Chloride: 105 mEq/L (ref 96–112)
Creatinine, Ser: 2.02 mg/dL — ABNORMAL HIGH (ref 0.40–1.50)
GFR: 41.49 mL/min — ABNORMAL LOW (ref >60.00)
Glucose, Bld: 151 mg/dL — ABNORMAL HIGH (ref 70–99)
Potassium: 4.6 mEq/L (ref 3.5–5.1)
Sodium: 137 mEq/L (ref 135–145)

## 2015-11-03 DIAGNOSIS — N183 Chronic kidney disease, stage 3 (moderate): Secondary | ICD-10-CM | POA: Diagnosis not present

## 2015-11-03 DIAGNOSIS — H40053 Ocular hypertension, bilateral: Secondary | ICD-10-CM | POA: Diagnosis not present

## 2015-11-03 DIAGNOSIS — E1122 Type 2 diabetes mellitus with diabetic chronic kidney disease: Secondary | ICD-10-CM | POA: Diagnosis not present

## 2015-11-03 DIAGNOSIS — N179 Acute kidney failure, unspecified: Secondary | ICD-10-CM | POA: Diagnosis not present

## 2015-11-03 DIAGNOSIS — N390 Urinary tract infection, site not specified: Secondary | ICD-10-CM | POA: Diagnosis not present

## 2015-11-03 DIAGNOSIS — E785 Hyperlipidemia, unspecified: Secondary | ICD-10-CM | POA: Diagnosis not present

## 2015-11-03 DIAGNOSIS — E1139 Type 2 diabetes mellitus with other diabetic ophthalmic complication: Secondary | ICD-10-CM | POA: Diagnosis not present

## 2015-11-03 DIAGNOSIS — H548 Legal blindness, as defined in USA: Secondary | ICD-10-CM | POA: Diagnosis not present

## 2015-11-03 DIAGNOSIS — I129 Hypertensive chronic kidney disease with stage 1 through stage 4 chronic kidney disease, or unspecified chronic kidney disease: Secondary | ICD-10-CM | POA: Diagnosis not present

## 2015-11-04 ENCOUNTER — Telehealth: Payer: Self-pay | Admitting: Internal Medicine

## 2015-11-04 NOTE — Telephone Encounter (Signed)
Caleb Dunn is calling to get verbals for PT  1 week 1 2 week 3   Social work referral to help with community needs

## 2015-11-04 NOTE — Telephone Encounter (Signed)
Barnabas Lister given verbal orders.

## 2015-11-07 DIAGNOSIS — N179 Acute kidney failure, unspecified: Secondary | ICD-10-CM | POA: Diagnosis not present

## 2015-11-07 DIAGNOSIS — I129 Hypertensive chronic kidney disease with stage 1 through stage 4 chronic kidney disease, or unspecified chronic kidney disease: Secondary | ICD-10-CM | POA: Diagnosis not present

## 2015-11-07 DIAGNOSIS — N390 Urinary tract infection, site not specified: Secondary | ICD-10-CM | POA: Diagnosis not present

## 2015-11-07 DIAGNOSIS — E1122 Type 2 diabetes mellitus with diabetic chronic kidney disease: Secondary | ICD-10-CM | POA: Diagnosis not present

## 2015-11-07 DIAGNOSIS — E1139 Type 2 diabetes mellitus with other diabetic ophthalmic complication: Secondary | ICD-10-CM | POA: Diagnosis not present

## 2015-11-07 DIAGNOSIS — N183 Chronic kidney disease, stage 3 (moderate): Secondary | ICD-10-CM | POA: Diagnosis not present

## 2015-11-07 DIAGNOSIS — H40053 Ocular hypertension, bilateral: Secondary | ICD-10-CM | POA: Diagnosis not present

## 2015-11-07 DIAGNOSIS — E785 Hyperlipidemia, unspecified: Secondary | ICD-10-CM | POA: Diagnosis not present

## 2015-11-07 DIAGNOSIS — H548 Legal blindness, as defined in USA: Secondary | ICD-10-CM | POA: Diagnosis not present

## 2015-11-08 ENCOUNTER — Ambulatory Visit: Payer: Commercial Managed Care - HMO | Admitting: Adult Health

## 2015-11-08 ENCOUNTER — Telehealth: Payer: Self-pay | Admitting: Nurse Practitioner

## 2015-11-08 DIAGNOSIS — Z0289 Encounter for other administrative examinations: Secondary | ICD-10-CM

## 2015-11-08 NOTE — Telephone Encounter (Signed)
Patient called back.  Gave him message on lab results.

## 2015-11-08 NOTE — Telephone Encounter (Signed)
Patient called back.  Gave

## 2015-11-10 DIAGNOSIS — N390 Urinary tract infection, site not specified: Secondary | ICD-10-CM | POA: Diagnosis not present

## 2015-11-10 DIAGNOSIS — E1122 Type 2 diabetes mellitus with diabetic chronic kidney disease: Secondary | ICD-10-CM | POA: Diagnosis not present

## 2015-11-10 DIAGNOSIS — E1139 Type 2 diabetes mellitus with other diabetic ophthalmic complication: Secondary | ICD-10-CM | POA: Diagnosis not present

## 2015-11-10 DIAGNOSIS — E785 Hyperlipidemia, unspecified: Secondary | ICD-10-CM | POA: Diagnosis not present

## 2015-11-10 DIAGNOSIS — N179 Acute kidney failure, unspecified: Secondary | ICD-10-CM | POA: Diagnosis not present

## 2015-11-10 DIAGNOSIS — H40053 Ocular hypertension, bilateral: Secondary | ICD-10-CM | POA: Diagnosis not present

## 2015-11-10 DIAGNOSIS — H548 Legal blindness, as defined in USA: Secondary | ICD-10-CM | POA: Diagnosis not present

## 2015-11-10 DIAGNOSIS — N183 Chronic kidney disease, stage 3 (moderate): Secondary | ICD-10-CM | POA: Diagnosis not present

## 2015-11-10 DIAGNOSIS — I129 Hypertensive chronic kidney disease with stage 1 through stage 4 chronic kidney disease, or unspecified chronic kidney disease: Secondary | ICD-10-CM | POA: Diagnosis not present

## 2015-11-21 ENCOUNTER — Telehealth: Payer: Self-pay | Admitting: Internal Medicine

## 2015-11-21 NOTE — Telephone Encounter (Signed)
Patient states he recently had lab work done at the hospital and labs were sent to [facility].  Would like to know if Dr. Camila Li has received these and if he could find out the results.

## 2015-11-22 NOTE — Telephone Encounter (Signed)
Is it from 11/01/15? These labs are stable Thx

## 2015-11-23 NOTE — Telephone Encounter (Signed)
Pt informed- he states he has d/c Metformin due to kidney function.

## 2015-12-08 ENCOUNTER — Other Ambulatory Visit (INDEPENDENT_AMBULATORY_CARE_PROVIDER_SITE_OTHER): Payer: Commercial Managed Care - HMO

## 2015-12-08 DIAGNOSIS — E1139 Type 2 diabetes mellitus with other diabetic ophthalmic complication: Secondary | ICD-10-CM

## 2015-12-08 DIAGNOSIS — I119 Hypertensive heart disease without heart failure: Secondary | ICD-10-CM | POA: Diagnosis not present

## 2015-12-08 LAB — BASIC METABOLIC PANEL
BUN: 20 mg/dL (ref 6–23)
CO2: 26 mEq/L (ref 19–32)
Calcium: 9 mg/dL (ref 8.4–10.5)
Chloride: 106 mEq/L (ref 96–112)
Creatinine, Ser: 2.01 mg/dL — ABNORMAL HIGH (ref 0.40–1.50)
GFR: 41.72 mL/min — AB (ref >60.00)
Glucose, Bld: 138 mg/dL — ABNORMAL HIGH (ref 70–99)
POTASSIUM: 4.3 meq/L (ref 3.5–5.1)
SODIUM: 138 meq/L (ref 135–145)

## 2015-12-08 LAB — HEMOGLOBIN A1C: HEMOGLOBIN A1C: 7.5 % — AB (ref 4.6–6.5)

## 2015-12-12 ENCOUNTER — Ambulatory Visit (INDEPENDENT_AMBULATORY_CARE_PROVIDER_SITE_OTHER): Payer: Commercial Managed Care - HMO | Admitting: Internal Medicine

## 2015-12-12 ENCOUNTER — Encounter: Payer: Self-pay | Admitting: Internal Medicine

## 2015-12-12 DIAGNOSIS — E1139 Type 2 diabetes mellitus with other diabetic ophthalmic complication: Secondary | ICD-10-CM

## 2015-12-12 DIAGNOSIS — H4053X3 Glaucoma secondary to other eye disorders, bilateral, severe stage: Secondary | ICD-10-CM

## 2015-12-12 DIAGNOSIS — N184 Chronic kidney disease, stage 4 (severe): Secondary | ICD-10-CM

## 2015-12-12 DIAGNOSIS — I119 Hypertensive heart disease without heart failure: Secondary | ICD-10-CM | POA: Diagnosis not present

## 2015-12-12 MED ORDER — TRIAMCINOLONE ACETONIDE 0.5 % EX OINT: 1.0000 "application " | TOPICAL_OINTMENT | Freq: Two times a day (BID) | CUTANEOUS | 1 refills | Status: DC

## 2015-12-12 MED ORDER — DEPEND FITTED BRIEFS SM/MED MISC: 6 refills | Status: DC

## 2015-12-12 NOTE — Assessment & Plan Note (Signed)
Monitoring labs 

## 2015-12-12 NOTE — Progress Notes (Signed)
Pre visit review using our clinic review tool, if applicable. No additional management support is needed unless otherwise documented below in the visit note. 

## 2015-12-12 NOTE — Assessment & Plan Note (Signed)
Blindness The pt would like to "go to a nursing home".  Will ask [facility] to help

## 2015-12-12 NOTE — Assessment & Plan Note (Signed)
On Glimeperide

## 2015-12-12 NOTE — Progress Notes (Signed)
Subjective:  Patient ID: Caleb Nim Sr., male    DOB: Feb 10, 1940  Age: 75 y.o. MRN: [id number]  CC: Urinary Incontinence and Back Pain (Left side )   HPI Caleb Daughtry Sr. presents for DM, HTN, CAD f/u. C/o hands peeling. The pt would like to "go to a nursing home".   Outpatient Medications Prior to Visit  Medication Sig Dispense Refill  . aspirin 81 MG tablet Take 1 tablet (81 mg total) by mouth daily. 100 tablet 3  . Blood Glucose Monitoring Suppl (PRODIGY BLOOD GLUCOSE MONITOR) DEVI As dirrected 1 each 0  . cefpodoxime (VANTIN) 200 MG tablet Take 1 tablet (200 mg total) by mouth every 12 (twelve) hours. For 7 days 14 tablet 0  . cholecalciferol (VITAMIN D) 1000 UNITS tablet Take 1 tablet (1,000 Units total) by mouth daily. 100 tablet 3  . finasteride (PROSCAR) 5 MG tablet Take 1 tablet (5 mg total) by mouth daily. For Prostate 90 tablet 3  . FLUoxetine (PROZAC) 10 MG tablet Take 1 tablet (10 mg total) by mouth daily. 30 tablet 1  . glimepiride (AMARYL) 2 MG tablet TAKE 1 TABLET TWICE DAILY FOR DIABETES 180 tablet 2  . glucose blood test strip 1 each by Other route 2 (two) times daily. Use to check blood sugars twice a day Dx E11.8 300 each 3  . Linaclotide (LINZESS) 290 MCG CAPS capsule Take 1 capsule (290 mcg total) by mouth daily. 30 capsule 11  . losartan (COZAAR) 50 MG tablet Take 1 tablet (50 mg total) by mouth daily. 90 tablet 2  . lovastatin (MEVACOR) 20 MG tablet Take 1 tablet (20 mg total) by mouth daily. For Cholesterol 90 tablet 3  . metoprolol succinate (TOPROL-XL) 50 MG 24 hr tablet Take 1 tablet (50 mg total) by mouth daily. For Blood Pressure 90 tablet 2  . polyethylene glycol (MIRALAX / GLYCOLAX) packet Take 17 g by mouth daily. 30 each 5  . PRODIGY LANCETS 28G MISC 28 g by Other route 2 (two) times daily. Use to help check blood sugars twice a day Dx E11.8 300 each 3  . senna-docusate (SENOKOT-S) 8.6-50 MG tablet Take 1 tablet by mouth at bedtime as needed for  mild constipation. 30 tablet 1  . sitaGLIPtin (JANUVIA) 50 MG tablet Take 1 tablet (50 mg total) by mouth daily. 30 tablet 3   No facility-administered medications prior to visit.     ROS Review of Systems  Constitutional: Positive for fatigue. Negative for appetite change and unexpected weight change.  HENT: Negative for congestion, nosebleeds, sneezing, sore throat and trouble swallowing.   Eyes: Positive for visual disturbance. Negative for itching.  Respiratory: Negative for cough.   Cardiovascular: Negative for chest pain, palpitations and leg swelling.  Gastrointestinal: Negative for abdominal distention, blood in stool, diarrhea and nausea.  Genitourinary: Negative for frequency and hematuria.  Musculoskeletal: Positive for back pain and gait problem. Negative for joint swelling and neck pain.  Skin: Positive for rash.  Neurological: Negative for dizziness, tremors, speech difficulty and weakness.  Psychiatric/Behavioral: Positive for decreased concentration. Negative for agitation, dysphoric mood, sleep disturbance and suicidal ideas. The patient is not nervous/anxious.     Objective:  BP (!) 150/70   Pulse 72   Temp 97.7 F (36.5 C) (Oral)   Wt 201 lb (91.2 kg)   SpO2 97%   BMI 32.44 kg/m   BP Readings from Last 3 Encounters:  12/12/15 (!) 150/70  11/01/15 132/80  10/29/15 (!) 165/75  Wt Readings from Last 3 Encounters:  12/12/15 201 lb (91.2 kg)  11/01/15 201 lb (91.2 kg)  10/27/15 205 lb (93 kg)    Physical Exam  Constitutional: He is oriented to person, place, and time. He appears well-developed. No distress.  NAD  HENT:  Mouth/Throat: Oropharynx is clear and moist.  Eyes: Conjunctivae are normal. Pupils are equal, round, and reactive to light.  Neck: Normal range of motion. No JVD present. No thyromegaly present.  Cardiovascular: Normal rate, regular rhythm, normal heart sounds and intact distal pulses.  Exam reveals no gallop and no friction rub.     No murmur heard. Pulmonary/Chest: Effort normal and breath sounds normal. No respiratory distress. He has no wheezes. He has no rales. He exhibits no tenderness.  Abdominal: Soft. Bowel sounds are normal. He exhibits no distension and no mass. There is no tenderness. There is no rebound and no guarding.  Musculoskeletal: Normal range of motion. He exhibits tenderness. He exhibits no edema.  Lymphadenopathy:    He has no cervical adenopathy.  Neurological: He is alert and oriented to person, place, and time. He has normal reflexes. No cranial nerve deficit. He exhibits normal muscle tone. He displays a negative Romberg sign. Coordination abnormal. Gait normal.  Skin: Skin is warm and dry. Rash noted.  Psychiatric: He has a normal mood and affect. His behavior is normal. Judgment and thought content normal.  scaly palms Cane LS tender  Lab Results  Component Value Date   WBC 8.8 11/01/2015   HGB 13.0 11/01/2015   HCT 40.2 11/01/2015   PLT 240.0 11/01/2015   GLUCOSE 138 (H) 12/08/2015   CHOL 128 04/27/2013   TRIG 87.0 04/27/2013   HDL 41.60 04/27/2013   LDLCALC 69 04/27/2013   ALT 16 (L) 10/28/2015   AST 14 (L) 10/28/2015   NA 138 12/08/2015   K 4.3 12/08/2015   CL 106 12/08/2015   CREATININE 2.01 (H) 12/08/2015   BUN 20 12/08/2015   CO2 26 12/08/2015   TSH 0.80 08/13/2011   PSA 8.93 (H) 11/23/2014   HGBA1C 7.5 (H) 12/08/2015    Dg Chest 2 View  Result Date: 10/27/2015 CLINICAL DATA:  Chest pain EXAM: CHEST  2 VIEW COMPARISON:  02/12/2005 FINDINGS: Cardiac shadow is stable. Postsurgical changes are again seen. No acute infiltrate or sizable effusion is noted. Degenerative changes of thoracic spine are seen. IMPRESSION: No active cardiopulmonary disease. Electronically Signed   By: [name] M.D.   On: 10/27/2015 14:02   US Renal  Result Date: 10/28/2015 CLINICAL DATA:  Renal failure. Elevated BUN and creatinine. History of diabetes and hypertension. Elevated PSA. EXAM:  RENAL / URINARY TRACT ULTRASOUND COMPLETE COMPARISON:  None. FINDINGS: Right Kidney: Length: 10.5 cm. Visualization is somewhat limited due to poor penetration. Parenchymal echotexture appears increased consistent with chronic medical renal disease. Hypoechoic lesion in the lower pole measuring 2.5 cm maximal diameter likely representing a cyst. No hydronephrosis. Left Kidney: Length: 9.7 cm. Diffuse parenchymal atrophy with prominent renal sinus fat. No focal lesion or hydronephrosis. Bladder: Bladder wall is not thickened. Layering debris demonstrated in the deep and bladder posteriorly. Bilaterally urine flow jets are identified on color flow Doppler imaging. Incidental note of prostate enlargement with prostate gland measuring 5 x 5.2 x 4.8 cm resulting in a calculated volume of 65 mL. IMPRESSION: Right renal cyst. Chronic parenchymal changes in both kidneys. No hydronephrosis. Layering debris in the bladder could be due to hemorrhage, stasis, or infection. Prostate gland is enlarged.  Electronically Signed   By: Lucienne Capers M.D.   On: 10/28/2015 02:25    Assessment & Plan:   There are no diagnoses linked to this encounter. I am having Mr. Aultman maintain his PRODIGY BLOOD GLUCOSE MONITOR, glucose blood, PRODIGY LANCETS 28G, cholecalciferol, aspirin, linaclotide, finasteride, FLUoxetine, metoprolol succinate, losartan, glimepiride, lovastatin, cefpodoxime, sitaGLIPtin, polyethylene glycol, and senna-docusate.  No orders of the defined types were placed in this encounter.    Follow-up: No Follow-up on file.  Walker Kehr, MD

## 2015-12-12 NOTE — Assessment & Plan Note (Signed)
Toprol. Verapamil

## 2015-12-22 ENCOUNTER — Telehealth: Payer: Self-pay | Admitting: Internal Medicine

## 2015-12-22 NOTE — Telephone Encounter (Signed)
Patient called to advise that he is experiencing worsening itching on his back. He is asking that something be called in to help his back. When he was seen, he was more concerned about his peeling hand, so he didn't bring up his back.

## 2015-12-23 MED ORDER — HYDROXYZINE HCL 25 MG PO TABS: 25.0000 mg | ORAL_TABLET | Freq: Three times a day (TID) | ORAL | 2 refills | Status: DC | PRN

## 2015-12-23 NOTE — Telephone Encounter (Signed)
Patient is calling again about the itching. Can you follow up with him. Thank you.

## 2015-12-23 NOTE — Telephone Encounter (Signed)
Hydroxyzine Rx emailed - use for itching bid-tid prn Thx

## 2015-12-26 NOTE — Telephone Encounter (Signed)
Pt informed

## 2015-12-27 ENCOUNTER — Telehealth: Payer: Self-pay | Admitting: Internal Medicine

## 2015-12-27 NOTE — Telephone Encounter (Signed)
Looking for start of care order for 10/12 to be faxed back.  Will refax form again today.  Please return.

## 2015-12-28 ENCOUNTER — Telehealth: Payer: Self-pay

## 2015-12-28 DIAGNOSIS — N390 Urinary tract infection, site not specified: Secondary | ICD-10-CM | POA: Diagnosis not present

## 2015-12-28 DIAGNOSIS — N179 Acute kidney failure, unspecified: Secondary | ICD-10-CM | POA: Diagnosis not present

## 2015-12-28 DIAGNOSIS — E1122 Type 2 diabetes mellitus with diabetic chronic kidney disease: Secondary | ICD-10-CM | POA: Diagnosis not present

## 2015-12-28 DIAGNOSIS — I129 Hypertensive chronic kidney disease with stage 1 through stage 4 chronic kidney disease, or unspecified chronic kidney disease: Secondary | ICD-10-CM | POA: Diagnosis not present

## 2015-12-28 NOTE — Telephone Encounter (Signed)
Home Health Cert/Plan of Care received (11/03/2015 - 01/01/2016) signed. Faxed and copy sent to scan

## 2015-12-28 NOTE — Telephone Encounter (Signed)
Form completed by PCP and given to [facility].

## 2016-01-03 ENCOUNTER — Ambulatory Visit (INDEPENDENT_AMBULATORY_CARE_PROVIDER_SITE_OTHER): Payer: Commercial Managed Care - HMO | Admitting: Internal Medicine

## 2016-01-03 ENCOUNTER — Encounter: Payer: Self-pay | Admitting: Internal Medicine

## 2016-01-03 DIAGNOSIS — E1139 Type 2 diabetes mellitus with other diabetic ophthalmic complication: Secondary | ICD-10-CM

## 2016-01-03 DIAGNOSIS — H5316 Psychophysical visual disturbances: Secondary | ICD-10-CM

## 2016-01-03 DIAGNOSIS — L299 Pruritus, unspecified: Secondary | ICD-10-CM | POA: Diagnosis not present

## 2016-01-03 DIAGNOSIS — F4323 Adjustment disorder with mixed anxiety and depressed mood: Secondary | ICD-10-CM | POA: Diagnosis not present

## 2016-01-03 MED ORDER — EUCERIN EX LOTN: TOPICAL_LOTION | CUTANEOUS | 3 refills | Status: DC | PRN

## 2016-01-03 MED ORDER — FLUOXETINE HCL 20 MG PO TABS: 20.0000 mg | ORAL_TABLET | Freq: Every day | ORAL | 5 refills | Status: DC

## 2016-01-03 NOTE — Assessment & Plan Note (Signed)
He had visual hallucinations on Hydroxyzine - will d/c

## 2016-01-03 NOTE — Assessment & Plan Note (Signed)
On Glimeperide

## 2016-01-03 NOTE — Assessment & Plan Note (Signed)
Increase prozac to 20 mg/d Pt wants to stay at home now

## 2016-01-03 NOTE — Progress Notes (Signed)
Subjective:  Patient ID: Caleb Nim Sr., male    DOB: 28-Dec-1940  Age: 75 y.o. MRN: [id number]  CC: No chief complaint on file.   HPI Caleb [employer] Sr. presents for itching, depression - not better, LBP - better, DM f/u. He is not interested in assisted living any longer. Rash on hands is better.  He had visual hallucinations on Hydroxyzine  Outpatient Medications Prior to Visit  Medication Sig Dispense Refill  . aspirin 81 MG tablet Take 1 tablet (81 mg total) by mouth daily. 100 tablet 3  . Blood Glucose Monitoring Suppl (PRODIGY BLOOD GLUCOSE MONITOR) DEVI As dirrected 1 each 0  . cholecalciferol (VITAMIN D) 1000 UNITS tablet Take 1 tablet (1,000 Units total) by mouth daily. 100 tablet 3  . finasteride (PROSCAR) 5 MG tablet Take 1 tablet (5 mg total) by mouth daily. For Prostate 90 tablet 3  . FLUoxetine (PROZAC) 10 MG tablet Take 1 tablet (10 mg total) by mouth daily. 30 tablet 1  . glimepiride (AMARYL) 2 MG tablet TAKE 1 TABLET TWICE DAILY FOR DIABETES 180 tablet 2  . glucose blood test strip 1 each by Other route 2 (two) times daily. Use to check blood sugars twice a day Dx E11.8 300 each 3  . hydrOXYzine (ATARAX/VISTARIL) 25 MG tablet Take 1 tablet (25 mg total) by mouth every 8 (eight) hours as needed. 60 tablet 2  . Incontinence Supply Disposable (DEPEND FITTED BRIEFS SM/MED) MISC Use tid prn 100 each 6  . Linaclotide (LINZESS) 290 MCG CAPS capsule Take 1 capsule (290 mcg total) by mouth daily. 30 capsule 11  . losartan (COZAAR) 50 MG tablet Take 1 tablet (50 mg total) by mouth daily. 90 tablet 2  . lovastatin (MEVACOR) 20 MG tablet Take 1 tablet (20 mg total) by mouth daily. For Cholesterol 90 tablet 3  . metoprolol succinate (TOPROL-XL) 50 MG 24 hr tablet Take 1 tablet (50 mg total) by mouth daily. For Blood Pressure 90 tablet 2  . polyethylene glycol (MIRALAX / GLYCOLAX) packet Take 17 g by mouth daily. 30 each 5  . PRODIGY LANCETS 28G MISC 28 g by Other route 2 (two)  times daily. Use to help check blood sugars twice a day Dx E11.8 300 each 3  . senna-docusate (SENOKOT-S) 8.6-50 MG tablet Take 1 tablet by mouth at bedtime as needed for mild constipation. 30 tablet 1  . sitaGLIPtin (JANUVIA) 50 MG tablet Take 1 tablet (50 mg total) by mouth daily. 30 tablet 3  . triamcinolone ointment (KENALOG) 0.5 % Apply 1 application topically 2 (two) times daily. On palms 30 g 1   No facility-administered medications prior to visit.     ROS Review of Systems  Constitutional: Negative for appetite change, fatigue and unexpected weight change.  HENT: Negative for congestion, nosebleeds, sneezing, sore throat and trouble swallowing.   Eyes: Positive for visual disturbance. Negative for itching.  Respiratory: Negative for cough.   Cardiovascular: Negative for chest pain, palpitations and leg swelling.  Gastrointestinal: Negative for abdominal distention, blood in stool, diarrhea and nausea.  Genitourinary: Negative for frequency and hematuria.  Musculoskeletal: Positive for gait problem. Negative for back pain, joint swelling and neck pain.  Skin: Positive for rash.  Neurological: Negative for dizziness, tremors, speech difficulty and weakness.  Psychiatric/Behavioral: Negative for agitation, dysphoric mood, sleep disturbance and suicidal ideas. The patient is not nervous/anxious.     Objective:  BP 140/80   Pulse 76   Wt 202 lb (91.6 kg)  SpO2 99%   BMI 32.60 kg/m   BP Readings from Last 3 Encounters:  01/03/16 140/80  12/12/15 (!) 150/70  11/01/15 132/80    Wt Readings from Last 3 Encounters:  01/03/16 202 lb (91.6 kg)  12/12/15 201 lb (91.2 kg)  11/01/15 201 lb (91.2 kg)    Physical Exam  Constitutional: He is oriented to person, place, and time. He appears well-developed. No distress.  NAD  HENT:  Mouth/Throat: Oropharynx is clear and moist.  Eyes: Conjunctivae are normal. Pupils are equal, round, and reactive to light.  Neck: Normal range of  motion. No JVD present. No thyromegaly present.  Cardiovascular: Normal rate, regular rhythm, normal heart sounds and intact distal pulses.  Exam reveals no gallop and no friction rub.   No murmur heard. Pulmonary/Chest: Effort normal and breath sounds normal. No respiratory distress. He has no wheezes. He has no rales. He exhibits no tenderness.  Abdominal: Soft. Bowel sounds are normal. He exhibits no distension and no mass. There is no tenderness. There is no rebound and no guarding.  Musculoskeletal: Normal range of motion. He exhibits no edema or tenderness.  Lymphadenopathy:    He has no cervical adenopathy.  Neurological: He is alert and oriented to person, place, and time. He has normal reflexes. No cranial nerve deficit. He exhibits normal muscle tone. He displays a negative Romberg sign. Coordination and gait normal.  Skin: Skin is warm and dry. No rash noted.  Psychiatric: He has a normal mood and affect. His behavior is normal. Judgment and thought content normal.  blind cane  Lab Results  Component Value Date   WBC 8.8 11/01/2015   HGB 13.0 11/01/2015   HCT 40.2 11/01/2015   PLT 240.0 11/01/2015   GLUCOSE 138 (H) 12/08/2015   CHOL 128 04/27/2013   TRIG 87.0 04/27/2013   HDL 41.60 04/27/2013   LDLCALC 69 04/27/2013   ALT 16 (L) 10/28/2015   AST 14 (L) 10/28/2015   NA 138 12/08/2015   K 4.3 12/08/2015   CL 106 12/08/2015   CREATININE 2.01 (H) 12/08/2015   BUN 20 12/08/2015   CO2 26 12/08/2015   TSH 0.80 08/13/2011   PSA 8.93 (H) 11/23/2014   HGBA1C 7.5 (H) 12/08/2015    Dg Chest 2 View  Result Date: 10/27/2015 CLINICAL DATA:  Chest pain EXAM: CHEST  2 VIEW COMPARISON:  02/12/2005 FINDINGS: Cardiac shadow is stable. Postsurgical changes are again seen. No acute infiltrate or sizable effusion is noted. Degenerative changes of thoracic spine are seen. IMPRESSION: No active cardiopulmonary disease. Electronically Signed   By: [name] M.D.   On: 10/27/2015 14:02    US Renal  Result Date: 10/28/2015 CLINICAL DATA:  Renal failure. Elevated BUN and creatinine. History of diabetes and hypertension. Elevated PSA. EXAM: RENAL / URINARY TRACT ULTRASOUND COMPLETE COMPARISON:  None. FINDINGS: Right Kidney: Length: 10.5 cm. Visualization is somewhat limited due to poor penetration. Parenchymal echotexture appears increased consistent with chronic medical renal disease. Hypoechoic lesion in the lower pole measuring 2.5 cm maximal diameter likely representing a cyst. No hydronephrosis. Left Kidney: Length: 9.7 cm. Diffuse parenchymal atrophy with prominent renal sinus fat. No focal lesion or hydronephrosis. Bladder: Bladder wall is not thickened. Layering debris demonstrated in the deep and bladder posteriorly. Bilaterally urine flow jets are identified on color flow Doppler imaging. Incidental note of prostate enlargement with prostate gland measuring 5 x 5.2 x 4.8 cm resulting in a calculated volume of 65 mL. IMPRESSION: Right renal cyst. Chronic  parenchymal changes in both kidneys. No hydronephrosis. Layering debris in the bladder could be due to hemorrhage, stasis, or infection. Prostate gland is enlarged. Electronically Signed   By: Lucienne Capers M.D.   On: 10/28/2015 02:25    Assessment & Plan:   There are no diagnoses linked to this encounter. I am having Caleb Dunn maintain his PRODIGY BLOOD GLUCOSE MONITOR, glucose blood, PRODIGY LANCETS 28G, cholecalciferol, aspirin, linaclotide, finasteride, FLUoxetine, metoprolol succinate, losartan, glimepiride, lovastatin, sitaGLIPtin, polyethylene glycol, senna-docusate, triamcinolone ointment, DEPEND FITTED BRIEFS SM/MED, and hydrOXYzine.  No orders of the defined types were placed in this encounter.    Follow-up: No Follow-up on file.  Walker Kehr, MD

## 2016-01-03 NOTE — Assessment & Plan Note (Addendum)
Eucerin lotion Treat DM

## 2016-01-03 NOTE — Progress Notes (Signed)
Pre visit review using our clinic review tool, if applicable. No additional management support is needed unless otherwise documented below in the visit note. 

## 2016-01-20 ENCOUNTER — Telehealth: Payer: Self-pay | Admitting: Internal Medicine

## 2016-01-20 NOTE — Telephone Encounter (Signed)
Pt's daughter called today regarding the referral for [facility] that was put in back in November.  She states she heard from [facility] and was told they needed more information about why he needed to be seen but there is no note that they called [place].  She states pt has not been seen. I talked with [facility] and they seen him in the past. Can you put another referral in for [facility] and I will make sure [facility] gets it.

## 2016-01-22 NOTE — Telephone Encounter (Signed)
Originally the pt ask for a nursing home placement. Later he changed his mind. He said he is taken care of now fine Thx

## 2016-01-24 NOTE — Telephone Encounter (Signed)
I called the daughter back but the pt answered and he states he doesn't think he needs any HH but I could tell by his answer he wasn't sure. He then asked what would they do. He is totally blind and can get around the apartment fine, but that's it. I told him I would have you call him and see if there is anything he may need.

## 2016-01-24 NOTE — Telephone Encounter (Signed)
Patient is refusing any assistance

## 2016-01-27 ENCOUNTER — Telehealth: Payer: Self-pay | Admitting: Internal Medicine

## 2016-01-27 NOTE — Telephone Encounter (Signed)
Pt called in said that he is seeing people.  He is not sure if it is his meds or not.  Wants to know what he needs to do?

## 2016-01-28 NOTE — Telephone Encounter (Signed)
pls stop Fluoxetine. See how it is in 3 weeks Thx

## 2016-01-30 NOTE — Telephone Encounter (Signed)
Notified pt w/MD response.../lmb 

## 2016-03-08 ENCOUNTER — Other Ambulatory Visit: Payer: Self-pay | Admitting: Nurse Practitioner

## 2016-03-08 DIAGNOSIS — E1139 Type 2 diabetes mellitus with other diabetic ophthalmic complication: Secondary | ICD-10-CM

## 2016-03-13 ENCOUNTER — Telehealth: Payer: Self-pay | Admitting: Internal Medicine

## 2016-03-13 ENCOUNTER — Inpatient Hospital Stay (HOSPITAL_COMMUNITY)
Admission: EM | Admit: 2016-03-13 | Discharge: 2016-03-16 | DRG: 064 | Disposition: A | Discharge status: Rehabilitation Facility | Payer: Medicare HMO | Attending: Internal Medicine | Admitting: Internal Medicine

## 2016-03-13 ENCOUNTER — Encounter (HOSPITAL_COMMUNITY): Payer: Self-pay

## 2016-03-13 ENCOUNTER — Emergency Department (HOSPITAL_COMMUNITY): Payer: Medicare HMO

## 2016-03-13 DIAGNOSIS — N179 Acute kidney failure, unspecified: Secondary | ICD-10-CM | POA: Diagnosis not present

## 2016-03-13 DIAGNOSIS — Z7982 Long term (current) use of aspirin: Secondary | ICD-10-CM

## 2016-03-13 DIAGNOSIS — E1142 Type 2 diabetes mellitus with diabetic polyneuropathy: Secondary | ICD-10-CM | POA: Diagnosis not present

## 2016-03-13 DIAGNOSIS — S299XXA Unspecified injury of thorax, initial encounter: Secondary | ICD-10-CM | POA: Diagnosis not present

## 2016-03-13 DIAGNOSIS — Z841 Family history of disorders of kidney and ureter: Secondary | ICD-10-CM

## 2016-03-13 DIAGNOSIS — E1122 Type 2 diabetes mellitus with diabetic chronic kidney disease: Secondary | ICD-10-CM | POA: Diagnosis present

## 2016-03-13 DIAGNOSIS — R260 Ataxic gait: Secondary | ICD-10-CM | POA: Diagnosis present

## 2016-03-13 DIAGNOSIS — I2510 Atherosclerotic heart disease of native coronary artery without angina pectoris: Secondary | ICD-10-CM | POA: Diagnosis not present

## 2016-03-13 DIAGNOSIS — N183 Chronic kidney disease, stage 3 (moderate): Secondary | ICD-10-CM | POA: Diagnosis not present

## 2016-03-13 DIAGNOSIS — Z87891 Personal history of nicotine dependence: Secondary | ICD-10-CM | POA: Diagnosis not present

## 2016-03-13 DIAGNOSIS — R29703 NIHSS score 3: Secondary | ICD-10-CM | POA: Diagnosis present

## 2016-03-13 DIAGNOSIS — N184 Chronic kidney disease, stage 4 (severe): Secondary | ICD-10-CM | POA: Diagnosis present

## 2016-03-13 DIAGNOSIS — I129 Hypertensive chronic kidney disease with stage 1 through stage 4 chronic kidney disease, or unspecified chronic kidney disease: Secondary | ICD-10-CM | POA: Diagnosis present

## 2016-03-13 DIAGNOSIS — R823 Hemoglobinuria: Secondary | ICD-10-CM | POA: Diagnosis present

## 2016-03-13 DIAGNOSIS — Z79899 Other long term (current) drug therapy: Secondary | ICD-10-CM | POA: Diagnosis not present

## 2016-03-13 DIAGNOSIS — K219 Gastro-esophageal reflux disease without esophagitis: Secondary | ICD-10-CM | POA: Diagnosis present

## 2016-03-13 DIAGNOSIS — K5900 Constipation, unspecified: Secondary | ICD-10-CM | POA: Diagnosis not present

## 2016-03-13 DIAGNOSIS — E1139 Type 2 diabetes mellitus with other diabetic ophthalmic complication: Secondary | ICD-10-CM | POA: Diagnosis present

## 2016-03-13 DIAGNOSIS — R972 Elevated prostate specific antigen [PSA]: Secondary | ICD-10-CM | POA: Diagnosis present

## 2016-03-13 DIAGNOSIS — Z888 Allergy status to other drugs, medicaments and biological substances status: Secondary | ICD-10-CM

## 2016-03-13 DIAGNOSIS — N400 Enlarged prostate without lower urinary tract symptoms: Secondary | ICD-10-CM | POA: Diagnosis not present

## 2016-03-13 DIAGNOSIS — I609 Nontraumatic subarachnoid hemorrhage, unspecified: Secondary | ICD-10-CM | POA: Diagnosis not present

## 2016-03-13 DIAGNOSIS — R2681 Unsteadiness on feet: Secondary | ICD-10-CM

## 2016-03-13 DIAGNOSIS — E669 Obesity, unspecified: Secondary | ICD-10-CM | POA: Diagnosis present

## 2016-03-13 DIAGNOSIS — E785 Hyperlipidemia, unspecified: Secondary | ICD-10-CM | POA: Diagnosis not present

## 2016-03-13 DIAGNOSIS — I639 Cerebral infarction, unspecified: Secondary | ICD-10-CM | POA: Diagnosis present

## 2016-03-13 DIAGNOSIS — H409 Unspecified glaucoma: Secondary | ICD-10-CM | POA: Diagnosis present

## 2016-03-13 DIAGNOSIS — S0990XA Unspecified injury of head, initial encounter: Secondary | ICD-10-CM | POA: Diagnosis not present

## 2016-03-13 DIAGNOSIS — E11311 Type 2 diabetes mellitus with unspecified diabetic retinopathy with macular edema: Secondary | ICD-10-CM | POA: Diagnosis not present

## 2016-03-13 DIAGNOSIS — H548 Legal blindness, as defined in USA: Secondary | ICD-10-CM | POA: Diagnosis present

## 2016-03-13 DIAGNOSIS — G5602 Carpal tunnel syndrome, left upper limb: Secondary | ICD-10-CM | POA: Diagnosis present

## 2016-03-13 DIAGNOSIS — H547 Unspecified visual loss: Secondary | ICD-10-CM | POA: Diagnosis not present

## 2016-03-13 DIAGNOSIS — F4323 Adjustment disorder with mixed anxiety and depressed mood: Secondary | ICD-10-CM | POA: Diagnosis present

## 2016-03-13 DIAGNOSIS — Z833 Family history of diabetes mellitus: Secondary | ICD-10-CM

## 2016-03-13 DIAGNOSIS — I10 Essential (primary) hypertension: Secondary | ICD-10-CM | POA: Diagnosis not present

## 2016-03-13 DIAGNOSIS — G8191 Hemiplegia, unspecified affecting right dominant side: Secondary | ICD-10-CM | POA: Diagnosis present

## 2016-03-13 DIAGNOSIS — I252 Old myocardial infarction: Secondary | ICD-10-CM

## 2016-03-13 DIAGNOSIS — J310 Chronic rhinitis: Secondary | ICD-10-CM | POA: Diagnosis present

## 2016-03-13 DIAGNOSIS — Z7984 Long term (current) use of oral hypoglycemic drugs: Secondary | ICD-10-CM | POA: Diagnosis not present

## 2016-03-13 DIAGNOSIS — I6789 Other cerebrovascular disease: Secondary | ICD-10-CM | POA: Diagnosis not present

## 2016-03-13 DIAGNOSIS — Z6835 Body mass index (BMI) 35.0-35.9, adult: Secondary | ICD-10-CM

## 2016-03-13 DIAGNOSIS — I69393 Ataxia following cerebral infarction: Secondary | ICD-10-CM | POA: Diagnosis present

## 2016-03-13 DIAGNOSIS — Z8249 Family history of ischemic heart disease and other diseases of the circulatory system: Secondary | ICD-10-CM

## 2016-03-13 DIAGNOSIS — R2689 Other abnormalities of gait and mobility: Secondary | ICD-10-CM | POA: Diagnosis not present

## 2016-03-13 DIAGNOSIS — I63412 Cerebral infarction due to embolism of left middle cerebral artery: Principal | ICD-10-CM | POA: Diagnosis present

## 2016-03-13 DIAGNOSIS — I69351 Hemiplegia and hemiparesis following cerebral infarction affecting right dominant side: Secondary | ICD-10-CM | POA: Diagnosis not present

## 2016-03-13 DIAGNOSIS — R0989 Other specified symptoms and signs involving the circulatory and respiratory systems: Secondary | ICD-10-CM | POA: Diagnosis not present

## 2016-03-13 DIAGNOSIS — R809 Proteinuria, unspecified: Secondary | ICD-10-CM | POA: Diagnosis present

## 2016-03-13 DIAGNOSIS — M5010 Cervical disc disorder with radiculopathy, unspecified cervical region: Secondary | ICD-10-CM | POA: Diagnosis present

## 2016-03-13 DIAGNOSIS — I638 Other cerebral infarction: Secondary | ICD-10-CM | POA: Diagnosis not present

## 2016-03-13 DIAGNOSIS — F418 Other specified anxiety disorders: Secondary | ICD-10-CM | POA: Diagnosis present

## 2016-03-13 LAB — CBC
HEMATOCRIT: 41.1 % (ref 39.0–52.0)
HEMOGLOBIN: 13.8 g/dL (ref 13.0–17.0)
MCH: 27.7 pg (ref 26.0–34.0)
MCHC: 33.6 g/dL (ref 30.0–36.0)
MCV: 82.5 fL (ref 78.0–100.0)
Platelets: 195 10*3/uL (ref 150–400)
RBC: 4.98 MIL/uL (ref 4.22–5.81)
RDW: 13.8 % (ref 11.5–15.5)
WBC: 6.2 10*3/uL (ref 4.0–10.5)

## 2016-03-13 LAB — URINALYSIS, ROUTINE W REFLEX MICROSCOPIC
Bilirubin Urine: NEGATIVE
Glucose, UA: NEGATIVE mg/dL
KETONES UR: NEGATIVE mg/dL
LEUKOCYTES UA: NEGATIVE
Nitrite: NEGATIVE
PROTEIN: 100 mg/dL — AB
Specific Gravity, Urine: 1.008 (ref 1.005–1.030)
pH: 5 (ref 5.0–8.0)

## 2016-03-13 LAB — DIFFERENTIAL
BASOS ABS: 0 10*3/uL (ref 0.0–0.1)
BASOS PCT: 0 %
Eosinophils Absolute: 0.3 10*3/uL (ref 0.0–0.7)
Eosinophils Relative: 4 %
LYMPHS PCT: 37 %
Lymphs Abs: 2.3 10*3/uL (ref 0.7–4.0)
MONOS PCT: 8 %
Monocytes Absolute: 0.5 10*3/uL (ref 0.1–1.0)
NEUTROS ABS: 3.1 10*3/uL (ref 1.7–7.7)
Neutrophils Relative %: 51 %

## 2016-03-13 LAB — COMPREHENSIVE METABOLIC PANEL
ALK PHOS: 73 U/L (ref 38–126)
ALT: 28 U/L (ref 17–63)
AST: 22 U/L (ref 15–41)
Albumin: 3.5 g/dL (ref 3.5–5.0)
Anion gap: 6 (ref 5–15)
BUN: 20 mg/dL (ref 6–20)
CALCIUM: 8.7 mg/dL — AB (ref 8.9–10.3)
CO2: 24 mmol/L (ref 22–32)
CREATININE: 2.1 mg/dL — AB (ref 0.61–1.24)
Chloride: 106 mmol/L (ref 101–111)
GFR calc Af Amer: 34 mL/min — ABNORMAL LOW (ref >60)
GFR calc non Af Amer: 29 mL/min — ABNORMAL LOW (ref >60)
Glucose, Bld: 150 mg/dL — ABNORMAL HIGH (ref 65–99)
Potassium: 4.2 mmol/L (ref 3.5–5.1)
Sodium: 136 mmol/L (ref 135–145)
Total Bilirubin: 0.4 mg/dL (ref 0.3–1.2)
Total Protein: 7.6 g/dL (ref 6.5–8.1)

## 2016-03-13 LAB — PROTIME-INR
INR: 1.06
Prothrombin Time: 13.8 seconds (ref 11.4–15.2)

## 2016-03-13 LAB — I-STAT TROPONIN, ED: Troponin i, poc: 0 ng/mL (ref 0.00–0.08)

## 2016-03-13 LAB — APTT: APTT: 32 s (ref 24–36)

## 2016-03-13 MED ORDER — STROKE: EARLY STAGES OF RECOVERY BOOK
Freq: Once | Status: AC
  Administered 2016-03-13

## 2016-03-13 MED ORDER — ENOXAPARIN SODIUM 40 MG/0.4ML ~~LOC~~ SOLN
40.0000 mg | SUBCUTANEOUS | Status: DC
  Administered 2016-03-13: 40 mg via SUBCUTANEOUS
  Filled 2016-03-13: qty 0.4

## 2016-03-13 MED ORDER — SODIUM CHLORIDE 0.9% FLUSH
3.0000 mL | Freq: Two times a day (BID) | INTRAVENOUS | Status: DC
  Administered 2016-03-13 – 2016-03-16 (×6): 3 mL via INTRAVENOUS

## 2016-03-13 MED ORDER — INSULIN ASPART 100 UNIT/ML ~~LOC~~ SOLN
0.0000 [IU] | Freq: Three times a day (TID) | SUBCUTANEOUS | Status: DC
  Administered 2016-03-14: 1 [IU] via SUBCUTANEOUS
  Administered 2016-03-14: 2 [IU] via SUBCUTANEOUS
  Administered 2016-03-15 – 2016-03-16 (×3): 1 [IU] via SUBCUTANEOUS

## 2016-03-13 NOTE — Consult Note (Signed)
Admission H&P    Chief Complaint: Weakness and dizziness with unsteady gait.  HPI: Caleb Scholze Sr. is an 76 y.o. male with a history of diabetes mellitus, hypertension, coronary artery disease, glaucoma and bilateral blindness, presenting with complaint of dizziness and unsteady gait as well as generalized weakness area of symptoms were present when he woke up this morning. No focal weakness was noted. He's had no change in speech and no difficulty with swallowing. No sensory changes were noted. He takes aspirin 81 mg per day. CT scan of the head showed chronic atrophic changes but was otherwise unremarkable. MRI of the brain showed a linear area of abnormal ago consistent with acute parietal infarction. There was also an area of subarachnoid hemorrhage in the left parietal region, questionably acute areas  LSN: 03/12/2016, evening tPA Given: No: Beyond time window for treatment consideration; SAH mRankin:  Past Medical History:  Diagnosis Date  . Blindness   . CAD (coronary artery disease)   . Cholelithiasis   . CTS (carpal tunnel syndrome)    Left  . DM type 2 with diabetic peripheral neuropathy ([place])   . ED (erectile dysfunction)   . Elevated PSA   . GERD (gastroesophageal reflux disease)   . Glaucoma   . HTN (hypertension)   . 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 MI (myocardial infarction) 1991  . Thyroid nodule   . Type II or unspecified type diabetes mellitus without mention of complication, not stated as uncontrolled     Past Surgical History:  Procedure Laterality Date  . CHOLECYSTECTOMY      Family History  Problem Relation Age of Onset  . Hypertension Mother   . Kidney disease Father   . Hypertension Father   . Coronary artery disease      1st degree Male relative <60  . Diabetes      1st degree relative   Social History:  reports that he has quit smoking. He has never used smokeless tobacco. He reports that he does not drink alcohol or use  drugs.  Allergies:  Allergies  Allergen Reactions  . Benazepril Hcl     REACTION: cough  . Hydroxyzine     Visual hallucinations  . Verapamil     hallucinations    Indications: Preadmission medications were reviewed by me.  ROS: History obtained from the patient  General ROS: negative for - chills, fatigue, fever, night sweats, weight gain or weight loss Psychological ROS: negative for - behavioral disorder, hallucinations, memory difficulties, mood swings or suicidal ideation Ophthalmic ROS: Bilateral chronic blindness ENT ROS: negative for - epistaxis, nasal discharge, oral lesions, sore throat, tinnitus or vertigo Allergy and Immunology ROS: negative for - hives or itchy/watery eyes Hematological and Lymphatic ROS: negative for - bleeding problems, bruising or swollen lymph nodes Endocrine ROS: negative for - galactorrhea, hair pattern changes, polydipsia/polyuria or temperature intolerance Respiratory ROS: negative for - cough, hemoptysis, shortness of breath or wheezing Cardiovascular ROS: negative for - chest pain, dyspnea on exertion, edema or irregular heartbeat Gastrointestinal ROS: negative for - abdominal pain, diarrhea, hematemesis, nausea/vomiting or stool incontinence Genito-Urinary ROS: negative for - dysuria, hematuria, incontinence or urinary frequency/urgency Musculoskeletal ROS: negative for - joint swelling or muscular weakness Neurological ROS: as noted in HPI Dermatological ROS: negative for rash and skin lesion changes  Physical Examination: Blood pressure 164/97, pulse 74, temperature 97.7 F (36.5 C), resp. rate 14, height _0  (1.676 m), weight 99.8 kg (220 lb), SpO2 100 %.  HEENT-  Normocephalic, no lesions, without obvious abnormality.  Normal external eye and conjunctiva.  Normal TM's bilaterally.  Normal auditory canals and external ears. Normal external nose, mucus membranes and septum.  Normal pharynx. Neck supple with no masses, nodes, nodules  or enlargement. Cardiovascular - regular rate and rhythm, S1, S2 normal, no murmur, click, rub or gallop Lungs - chest clear, no wheezing, rales, normal symmetric air entry Abdomen - soft, non-tender; bowel sounds normal; no masses,  no organomegaly Extremities - no joint deformities, effusion, or inflammation  Neurologic Examination: Mental Status: Alert, oriented, no acute distress.  Speech fluent without evidence of aphasia. Able to follow commands without difficulty. Cranial Nerves: II-complete blindness bilaterally. III/IV/right pupil did not react to light pupil obscured by corneal opacity. Extraocular movements were full and conjugate. Slight horizontal nystagmus with eyes at rest   V/VII-no facial numbness; flattening of right nasolabial fold noted. VIII-normal. X-normal speech. Motor: 5/5 strength bilaterally; mild resting tremor of left upper extremity; moderately increased tone of upper extremities. Sensory: Normal throughout. Deep Tendon Reflexes: 1+ and symmetric. Plantars: Flexor bilaterally Cerebellar: Normal coordination of upper extremities.  Results for orders placed or performed during the hospital encounter of 03/13/16 (from the past 48 hour(s))  Protime-INR     Status: None   Collection Time: 03/13/16  1:01 PM  Result Value Ref Range   Prothrombin Time 13.8 11.4 - 15.2 seconds   INR 1.06   APTT     Status: None   Collection Time: 03/13/16  1:01 PM  Result Value Ref Range   aPTT 32 24 - 36 seconds  CBC     Status: None   Collection Time: 03/13/16  1:01 PM  Result Value Ref Range   WBC 6.2 4.0 - 10.5 K/uL   RBC 4.98 4.22 - 5.81 MIL/uL   Hemoglobin 13.8 13.0 - 17.0 g/dL   HCT 41.1 39.0 - 52.0 %   MCV 82.5 78.0 - 100.0 fL   MCH 27.7 26.0 - 34.0 pg   MCHC 33.6 30.0 - 36.0 g/dL   RDW 13.8 11.5 - 15.5 %   Platelets 195 150 - 400 K/uL  Differential     Status: None   Collection Time: 03/13/16  1:01 PM  Result Value Ref Range   Neutrophils Relative % 51 %    Neutro Abs 3.1 1.7 - 7.7 K/uL   Lymphocytes Relative 37 %   Lymphs Abs 2.3 0.7 - 4.0 K/uL   Monocytes Relative 8 %   Monocytes Absolute 0.5 0.1 - 1.0 K/uL   Eosinophils Relative 4 %   Eosinophils Absolute 0.3 0.0 - 0.7 K/uL   Basophils Relative 0 %   Basophils Absolute 0.0 0.0 - 0.1 K/uL  Comprehensive metabolic panel     Status: Abnormal   Collection Time: 03/13/16  1:01 PM  Result Value Ref Range   Sodium 136 135 - 145 mmol/L   Potassium 4.2 3.5 - 5.1 mmol/L   Chloride 106 101 - 111 mmol/L   CO2 24 22 - 32 mmol/L   Glucose, Bld 150 (H) 65 - 99 mg/dL   BUN 20 6 - 20 mg/dL   Creatinine, Ser 2.10 (H) 0.61 - 1.24 mg/dL   Calcium 8.7 (L) 8.9 - 10.3 mg/dL   Total Protein 7.6 6.5 - 8.1 g/dL   Albumin 3.5 3.5 - 5.0 g/dL   AST 22 15 - 41 U/L   ALT 28 17 - 63 U/L   Alkaline Phosphatase 73 38 - 126 U/L  Total Bilirubin 0.4 0.3 - 1.2 mg/dL   GFR calc non Af Amer 29 (L) >60 mL/min   GFR calc Af Amer 34 (L) >60 mL/min    Comment: (NOTE) The eGFR has been calculated using the CKD EPI equation. This calculation has not been validated in all clinical situations. eGFR's persistently <60 mL/min signify possible Chronic Kidney Disease.    Anion gap 6 5 - 15  I-stat troponin, ED     Status: None   Collection Time: 03/13/16  1:20 PM  Result Value Ref Range   Troponin i, poc 0.00 0.00 - 0.08 ng/mL   Comment 3            Comment: Due to the release kinetics of cTnI, a negative result within the first hours of the onset of symptoms does not rule out myocardial infarction with certainty. If myocardial infarction is still suspected, repeat the test at appropriate intervals.   Urinalysis, Routine w reflex microscopic     Status: Abnormal   Collection Time: 03/13/16  5:56 PM  Result Value Ref Range   Color, Urine YELLOW YELLOW   APPearance CLEAR CLEAR   Specific Gravity, Urine 1.008 1.005 - 1.030   pH 5.0 5.0 - 8.0   Glucose, UA NEGATIVE NEGATIVE mg/dL   Hgb urine dipstick SMALL (A)  NEGATIVE   Bilirubin Urine NEGATIVE NEGATIVE   Ketones, ur NEGATIVE NEGATIVE mg/dL   Protein, ur 100 (A) NEGATIVE mg/dL   Nitrite NEGATIVE NEGATIVE   Leukocytes, UA NEGATIVE NEGATIVE   RBC / HPF 0-5 0 - 5 RBC/hpf   WBC, UA 0-5 0 - 5 WBC/hpf   Bacteria, UA RARE (A) NONE SEEN   Squamous Epithelial / LPF 0-5 (A) NONE SEEN   Dg Chest 2 View  Result Date: 03/13/2016 CLINICAL DATA:  Unsteady gait and fall today. History of hypertension, diabetes. EXAM: CHEST  2 VIEW COMPARISON:  PA and lateral chest x-ray of October 27, 2015 FINDINGS: Today's study is obtained in a lordotic projection. The lungs are well-expanded and clear. The heart is top-normal in size. The pulmonary vascularity is normal. The patient has undergone previous CABG. The observed bony thorax is unremarkable. IMPRESSION: There is no active cardiopulmonary disease.  Previous CABG. Electronically Signed   By: David  Martinique M.D.   On: 03/13/2016 17:24   Ct Head Wo Contrast  Result Date: 03/13/2016 CLINICAL DATA:  Pt awoke with unsteady gait, stumbling; pt has also developed a Bilateral temporal h/a Pt denies any injury, or prior stroke EXAM: CT HEAD WITHOUT CONTRAST TECHNIQUE: Contiguous axial images were obtained from the base of the skull through the vertex without intravenous contrast. COMPARISON:  None. FINDINGS: Brain: Small area of hypoattenuation noted in the left thalamus, likely a chronic lacune or infarct. No definite acute infarction, hemorrhage, hydrocephalus, extra-axial collection or mass lesion/mass effect. The ventricles and sulci are enlarged reflecting mild to moderate diffuse atrophy. Mild patchy white matter hypoattenuation is noted consistent with chronic microvascular ischemic change. Vascular: No hyperdense vessel or unexpected calcification. Skull: Normal. Negative for fracture or focal lesion. Sinuses/Orbits: No acute orbital abnormality. Visualized sinuses and mastoid air cells are clear. Other: None. IMPRESSION: 1. No  acute intracranial abnormalities. 2. Atrophy and mild chronic microvascular ischemic change. Left thalamic lacune infarct. Electronically Signed   By: Lajean Manes M.D.   On: 03/13/2016 14:58   Mr Brain Wo Contrast  Result Date: 03/13/2016 CLINICAL DATA:  Unsteady gait.  Fall today. EXAM: MRI HEAD WITHOUT CONTRAST TECHNIQUE: Multiplanar, multiecho  pulse sequences of the brain and surrounding structures were obtained without intravenous contrast. COMPARISON:  CT head 03/13/2016 FINDINGS: Brain: Linear focus of restricted diffusion in the left parietal lobe most consistent with acute infarct involving gray and white matter. This is relatively small measuring approximately 5 x 20 mm. No other area of acute infarct. Generalized atrophy of a moderate degree. Chronic microvascular ischemic change in the white matter and pons. Chronic lacunar infarct in the left thalamus. Small chronic infarct left cerebellum. Negative for mass. Chronic microhemorrhage in the right posterior temporal lobe, and left thalamus. Small area of susceptibility in the left parietal subarachnoid space which may represent a small area of acute subarachnoid hemorrhage. This is not definitely seen on the CT scan. No subdural fluid collection. Vascular: Normal arterial flow void. Skull and upper cervical spine: Negative Sinuses/Orbits: Negative Other: None IMPRESSION: Linear focus of restricted diffusion in the left parietal lobe most consistent with acute infarct although somewhat unusual in shape. Small area of subarachnoid hemorrhage in the left parietal region which could be acute. This is not seen on the CT. Atrophy and chronic microvascular ischemic changes as above. Electronically Signed   By: Franchot Gallo M.D.   On: 03/13/2016 19:58    Assessment: 76 y.o. male with multiple risk factors for stroke presenting with acute left parietal ischemic infarction as well as an area of left parietal subarachnoid hemorrhage, possibly  acute.  Stroke Risk Factors - diabetes mellitus and hypertension  Plan: 1. HgbA1c, fasting lipid panel 2. MRA  of the brain without contrast 3. PT consult, OT consult 4. Echocardiogram 5. Carotid dopplers 6. Prophylactic therapy-Antiplatelet med: Aspirin  7. Risk factor modification 8. Telemetry monitoring  C.R. Nicole Kindred, MD [facility] [phone]  03/13/2016, 8:49 PM

## 2016-03-13 NOTE — Telephone Encounter (Signed)
Pt is in the ED 

## 2016-03-13 NOTE — ED Provider Notes (Signed)
[facility] DEPT Provider Note   CSN: [id number] Arrival date & time: 03/13/16  1238     History   Chief Complaint Chief Complaint  Patient presents with  . fall/unsteady gait    HPI Caleb Rubenstein Sr. is a 76 y.o. male. Patient presents due to unsteady gait. He reports that when he awoke this morning, he noticed his gait was abnormal. He was trying to walk about the house, but having difficulty. He is chronically blind, but is typically able to walk and move about his house without difficulty. He denies any focal weakness. He did fall at one point where he describes easing himself on the ground. He denies hitting his head and denies any injury. The patient's son is here he states that his gait is much worse today, however he has been having slightly progressive gait difficulty over the last few weeks. Patient denies any chest pain, shortness of breath, cough, headache, or fever.  HPI  Past Medical History:  Diagnosis Date  . Blindness   . CAD (coronary artery disease)   . Cholelithiasis   . CTS (carpal tunnel syndrome)    Left  . DM type 2 with diabetic peripheral neuropathy ([place])   . ED (erectile dysfunction)   . Elevated PSA   . GERD (gastroesophageal reflux disease)   . Glaucoma   . HTN (hypertension)   . 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 MI (myocardial infarction) 1991  . Thyroid nodule   . Type II or unspecified type diabetes mellitus without mention of complication, not stated as uncontrolled     Patient Active Problem List   Diagnosis Date Noted  . Acute cerebrovascular accident (CVA) ([place]) 03/13/2016  . Essential hypertension 03/13/2016  . Pruritus 01/03/2016  . UTI (urinary tract infection) 10/27/2015  . Impacted cerumen of left ear 10/03/2015  . Sore throat in the morning 09/23/2015  . Situational mixed anxiety and depressive disorder 06/17/2015  . Neuropathic pain 03/09/2015  . Insomnia 03/09/2015  . Left arm weakness 08/13/2014  .  Herpes zoster 07/13/2014  . Constipation 07/13/2014  . Epididymitis 02/23/2014  . Pain in joint, ankle and foot 08/04/2013  . Dry mouth 05/05/2013  . 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 Dizziness 11/01/2011  . Obesity (BMI 30-39.9)   . Chronic kidney disease (CKD), stage IV (severe) ([place])   . Elevated PSA 08/17/2011  . Bladder neck obstruction 04/13/2011  . DM (diabetes mellitus), type 2 with ophthalmic complications ([place])   . Glaucoma associated with ocular disorder   . Carpal tunnel sundrome   . Erectile dysfunction   . GERD   . Cervical disc disorder with radiculopathy   . Hyperlipidemia   . Cholelithiases   . Hypertensive heart disease   . Coronary atherosclerosis     Past Surgical History:  Procedure Laterality Date  . CHOLECYSTECTOMY         Home Medications    Prior to Admission medications   Medication Sig Start Date End Date Taking? Authorizing Provider  aspirin 81 MG tablet Take 1 tablet (81 mg total) by mouth daily. 07/08/14   Aleksei Plotnikov V, MD  Blood Glucose Monitoring Suppl (PRODIGY BLOOD GLUCOSE MONITOR) DEVI As dirrected 08/22/12   Aleksei Plotnikov V, MD  cholecalciferol (VITAMIN D) 1000 UNITS tablet Take 1 tablet (1,000 Units total) by mouth daily. 04/21/14   Aleksei Plotnikov V, MD  Emollient (EUCERIN) lotion Apply topically as needed for dry skin. Use bid 01/03/16   Aleksei Plotnikov V,  MD  finasteride (PROSCAR) 5 MG tablet Take 1 tablet (5 mg total) by mouth daily. For Prostate 01/26/15   Tyrone Apple Plotnikov V, MD  FLUoxetine (PROZAC) 20 MG tablet Take 1 tablet (20 mg total) by mouth daily. 01/03/16   Aleksei Plotnikov V, MD  glimepiride (AMARYL) 2 MG tablet TAKE 1 TABLET TWICE DAILY FOR DIABETES 08/22/15   Aleksei Plotnikov V, MD  glucose blood test strip 1 each by Other route 2 (two) times daily. Use to check blood sugars twice a day Dx E11.8 01/13/14   Cassandria Anger, MD  Incontinence Supply Disposable (DEPEND FITTED BRIEFS SM/MED) MISC Use tid  prn 12/12/15   Cassandria Anger, MD  JANUVIA 50 MG tablet TAKE ONE TABLET BY MOUTH ONCE DAILY 03/08/16   Cassandria Anger, MD  Linaclotide (LINZESS) 290 MCG CAPS capsule Take 1 capsule (290 mcg total) by mouth daily. 07/13/14   Aleksei Plotnikov V, MD  losartan (COZAAR) 50 MG tablet Take 1 tablet (50 mg total) by mouth daily. 08/22/15   Aleksei Plotnikov V, MD  lovastatin (MEVACOR) 20 MG tablet Take 1 tablet (20 mg total) by mouth daily. For Cholesterol 09/07/15   Aleksei Plotnikov V, MD  metoprolol succinate (TOPROL-XL) 50 MG 24 hr tablet Take 1 tablet (50 mg total) by mouth daily. For Blood Pressure 08/22/15   Aleksei Plotnikov V, MD  polyethylene glycol (MIRALAX / GLYCOLAX) packet Take 17 g by mouth daily. 11/01/15   Flossie Buffy, NP  PRODIGY LANCETS 28G MISC 28 g by Other route 2 (two) times daily. Use to help check blood sugars twice a day Dx E11.8 01/13/14   Lew Dawes V, MD  senna-docusate (SENOKOT-S) 8.6-50 MG tablet Take 1 tablet by mouth at bedtime as needed for mild constipation. 11/01/15   Flossie Buffy, NP  triamcinolone ointment (KENALOG) 0.5 % Apply 1 application topically 2 (two) times daily. On palms 12/12/15 12/11/16  Cassandria Anger, MD    Family History Family History  Problem Relation Age of Onset  . Hypertension Mother   . Kidney disease Father   . Hypertension Father   . Coronary artery disease      1st degree Male relative <60  . Diabetes      1st degree relative    Social History Social History  Substance Use Topics  . Smoking status: Former Research scientist (life sciences)  . Smokeless tobacco: Never Used  . Alcohol use No     Allergies   Benazepril hcl; Hydroxyzine; and Verapamil   Review of Systems Review of Systems  Constitutional: Negative for chills and fever.  HENT: Negative for ear pain and sore throat.   Eyes: Negative for pain and visual disturbance.  Respiratory: Negative for cough and shortness of breath.   Cardiovascular: Negative for  chest pain and palpitations.  Gastrointestinal: Negative for abdominal pain and vomiting.  Genitourinary: Negative for dysuria and hematuria.  Musculoskeletal: Positive for gait problem. Negative for arthralgias and back pain.  Skin: Negative for color change and rash.  Neurological: Negative for seizures, syncope and numbness.  All other systems reviewed and are negative.    Physical Exam Updated Vital Signs BP (!) 190/94 (BP Location: Right Arm)   Pulse 93   Temp 98.1 F (36.7 C) (Oral)   Resp 20   Ht 5\' 6"  (1.676 m)   Wt 99.8 kg   SpO2 98%   BMI 35.51 kg/m   Physical Exam  Constitutional: He is oriented to person, place, and time. He appears  well-developed and well-nourished.  HENT:  Head: Normocephalic and atraumatic.  Eyes: Conjunctivae are normal.  Exotropia  Neck: Neck supple.  Cardiovascular: Normal rate, regular rhythm and intact distal pulses.   No murmur heard. Pulmonary/Chest: Effort normal and breath sounds normal. No respiratory distress. He has no wheezes. He has no rales.  Abdominal: Soft. There is no tenderness.  Musculoskeletal: He exhibits no edema, tenderness or deformity.  Neurological: He is alert and oriented to person, place, and time.  Strength 5/5 in all extremities. Sensation to light touch intact throughout. Pt is chronically blind in both eyes, but otherwise no CN abnormality. Pt was able to stand without difficulty but is unsteady when attempting to ambulate.  Skin: Skin is warm and dry.  Psychiatric: He has a normal mood and affect.  Nursing note and vitals reviewed.    ED Treatments / Results  Labs (all labs ordered are listed, but only abnormal results are displayed) Labs Reviewed  COMPREHENSIVE METABOLIC PANEL - Abnormal; Notable for the following:       Result Value   Glucose, Bld 150 (*)    Creatinine, Ser 2.10 (*)    Calcium 8.7 (*)    GFR calc non Af Amer 29 (*)    GFR calc Af Amer 34 (*)    All other components within normal  limits  URINALYSIS, ROUTINE W REFLEX MICROSCOPIC - Abnormal; Notable for the following:    Hgb urine dipstick SMALL (*)    Protein, ur 100 (*)    Bacteria, UA RARE (*)    Squamous Epithelial / LPF 0-5 (*)    All other components within normal limits  COMPREHENSIVE METABOLIC PANEL - Abnormal; Notable for the following:    Glucose, Bld 160 (*)    Creatinine, Ser 2.00 (*)    Calcium 8.7 (*)    Albumin 3.1 (*)    GFR calc non Af Amer 31 (*)    GFR calc Af Amer 36 (*)    All other components within normal limits  GLUCOSE, CAPILLARY - Abnormal; Notable for the following:    Glucose-Capillary 169 (*)    All other components within normal limits  PROTIME-INR  APTT  CBC  DIFFERENTIAL  LIPID PANEL  HEMOGLOBIN A1C  I-STAT TROPOININ, ED    EKG  EKG Interpretation None       Radiology Dg Chest 2 View  Result Date: 03/13/2016 CLINICAL DATA:  Unsteady gait and fall today. History of hypertension, diabetes. EXAM: CHEST  2 VIEW COMPARISON:  PA and lateral chest x-ray of October 27, 2015 FINDINGS: Today's study is obtained in a lordotic projection. The lungs are well-expanded and clear. The heart is top-normal in size. The pulmonary vascularity is normal. The patient has undergone previous CABG. The observed bony thorax is unremarkable. IMPRESSION: There is no active cardiopulmonary disease.  Previous CABG. Electronically Signed   By: David  Martinique M.D.   On: 03/13/2016 17:24   Ct Head Wo Contrast  Result Date: 03/13/2016 CLINICAL DATA:  Pt awoke with unsteady gait, stumbling; pt has also developed a Bilateral temporal h/a Pt denies any injury, or prior stroke EXAM: CT HEAD WITHOUT CONTRAST TECHNIQUE: Contiguous axial images were obtained from the base of the skull through the vertex without intravenous contrast. COMPARISON:  None. FINDINGS: Brain: Small area of hypoattenuation noted in the left thalamus, likely a chronic lacune or infarct. No definite acute infarction, hemorrhage,  hydrocephalus, extra-axial collection or mass lesion/mass effect. The ventricles and sulci are enlarged reflecting mild to  moderate diffuse atrophy. Mild patchy white matter hypoattenuation is noted consistent with chronic microvascular ischemic change. Vascular: No hyperdense vessel or unexpected calcification. Skull: Normal. Negative for fracture or focal lesion. Sinuses/Orbits: No acute orbital abnormality. Visualized sinuses and mastoid air cells are clear. Other: None. IMPRESSION: 1. No acute intracranial abnormalities. 2. Atrophy and mild chronic microvascular ischemic change. Left thalamic lacune infarct. Electronically Signed   By: Lajean Manes M.D.   On: 03/13/2016 14:58   Mr Brain Wo Contrast  Result Date: 03/13/2016 CLINICAL DATA:  Unsteady gait.  Fall today. EXAM: MRI HEAD WITHOUT CONTRAST TECHNIQUE: Multiplanar, multiecho pulse sequences of the brain and surrounding structures were obtained without intravenous contrast. COMPARISON:  CT head 03/13/2016 FINDINGS: Brain: Linear focus of restricted diffusion in the left parietal lobe most consistent with acute infarct involving gray and white matter. This is relatively small measuring approximately 5 x 20 mm. No other area of acute infarct. Generalized atrophy of a moderate degree. Chronic microvascular ischemic change in the white matter and pons. Chronic lacunar infarct in the left thalamus. Small chronic infarct left cerebellum. Negative for mass. Chronic microhemorrhage in the right posterior temporal lobe, and left thalamus. Small area of susceptibility in the left parietal subarachnoid space which may represent a small area of acute subarachnoid hemorrhage. This is not definitely seen on the CT scan. No subdural fluid collection. Vascular: Normal arterial flow void. Skull and upper cervical spine: Negative Sinuses/Orbits: Negative Other: None IMPRESSION: Linear focus of restricted diffusion in the left parietal lobe most consistent with acute  infarct although somewhat unusual in shape. Small area of subarachnoid hemorrhage in the left parietal region which could be acute. This is not seen on the CT. Atrophy and chronic microvascular ischemic changes as above. Electronically Signed   By: Franchot Gallo M.D.   On: 03/13/2016 19:58    Procedures Procedures (including critical care time)  Medications Ordered in ED Medications  sodium chloride flush (NS) 0.9 % injection 3 mL (3 mLs Intravenous Given 03/14/16 0939)  insulin aspart (novoLOG) injection 0-9 Units (2 Units Subcutaneous Given 03/14/16 0931)  linagliptin (TRADJENTA) tablet 5 mg (5 mg Oral Given 03/14/16 0931)  triamcinolone ointment (KENALOG) 0.5 % 1 application (1 application Topical Given 03/14/16 0941)  FLUoxetine (PROZAC) capsule 20 mg (20 mg Oral Given 03/14/16 0931)  aspirin EC tablet 81 mg (81 mg Oral Given 03/14/16 0931)  linaclotide (LINZESS) capsule 290 mcg (290 mcg Oral Given 03/14/16 0917)  finasteride (PROSCAR) tablet 5 mg (5 mg Oral Given 03/14/16 0931)  metoprolol succinate (TOPROL-XL) 24 hr tablet 50 mg (50 mg Oral Given 03/14/16 0931)  losartan (COZAAR) tablet 50 mg (50 mg Oral Given 03/14/16 0931)  glimepiride (AMARYL) tablet 2 mg (2 mg Oral Given 03/14/16 0917)  pravastatin (PRAVACHOL) tablet 20 mg (not administered)  senna-docusate (Senokot-S) tablet 1 tablet (not administered)  polyethylene glycol (MIRALAX / GLYCOLAX) packet 17 g (17 g Oral Given 03/14/16 0931)  pantoprazole (PROTONIX) EC tablet 40 mg (40 mg Oral Given 03/14/16 0931)   stroke: mapping our early stages of recovery book ( Does not apply Given 03/13/16 2354)     Initial Impression / Assessment and Plan / ED Course  I have reviewed the triage vital signs and the nursing notes.  Pertinent labs & imaging results that were available during my care of the patient were reviewed by me and considered in my medical decision making (see chart for details).    Pt with hx as above p/w unsteady gait. Much  worse this morning, although he has had progressive gait problems for the last few weeks. Pt fell today but denies any injury. CT head without acute injury. MRI obtained d/t concern for possible posterior circulation stroke. MRI with findings of L parietal infarct and small subarachnoid hemorrhage. Neurology consulted. Pt admitted to hospitalist for further workup and treatment.  Final Clinical Impressions(s) / ED Diagnoses   Final diagnoses:  Unsteady gait  Cerebrovascular accident (CVA), unspecified mechanism [facility])    New Prescriptions Current Discharge Medication List       Clifton James, MD 03/14/16 [place], MD 03/15/16 1245

## 2016-03-13 NOTE — ED Notes (Addendum)
Attempted report x1. Nurse provided callback number and requested to call back in 5 minutes.   Initial NIH Score: 3 Last Neuro Check: 0 No change in neuro status Swallow Screen: Passed Ambulation status: 1x assist [place] P. RN

## 2016-03-13 NOTE — H&P (Signed)
History and Physical    Caleb Groft Sr. ZGY:[id number] DOB: 1940/05/10 DOA: 03/13/2016  PCP: Walker Kehr, MD   Patient coming from: Home.  Chief Complaint: Steady gait.  HPI: Caleb Applegate Sr. is a 77 y.o. male with medical history significant of blindness, CAD, cholelithiasis, carpal tunnel syndrome, type 2 diabetes with peripheral neuropathy, ED, elevated PSA, GERD, glaucoma, hypertension who was brought to the emergency department due to unsteady gait since this morning. He denies weakness or sensorial changes. His son, who is present in the emergency department, denies slurred speech or dysphagia. He denies chest pain, dyspnea, palpitations, diaphoresis, nausea, emesis and abdominal pain, dysuria or frequency.  ED Course: Urinalysis showed mild proteinuria and hemoglobinuria. His CBC was normal. Chemistries showed a glucose of 150, creatinine of 2.1. Coagulation parameters and troponin were negative.  MRI of the brain showed a linear abnormal finding consistent with acute parietal infarction. Please see full radiology report  Review of Systems: As per HPI otherwise 10 point review of systems negative.    Past Medical History:  Diagnosis Date  . Blindness   . CAD (coronary artery disease)   . Cholelithiasis   . CTS (carpal tunnel syndrome)    Left  . DM type 2 with diabetic peripheral neuropathy ([place])   . ED (erectile dysfunction)   . Elevated PSA   . GERD (gastroesophageal reflux disease)   . Glaucoma   . HTN (hypertension)   . 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 MI (myocardial infarction) 1991  . Thyroid nodule   . Type II or unspecified type diabetes mellitus without mention of complication, not stated as uncontrolled     Past Surgical History:  Procedure Laterality Date  . CHOLECYSTECTOMY       reports that he has quit smoking. He has never used smokeless tobacco. He reports that he does not drink alcohol or use drugs.  Allergies  Allergen  Reactions  . Benazepril Hcl Cough  . Hydroxyzine Other (See Comments)    Visual hallucinations  . Verapamil Other (See Comments)    Hallucinations     Family History  Problem Relation Age of Onset  . Hypertension Mother   . Kidney disease Father   . Hypertension Father   . Coronary artery disease      1st degree Male relative <60  . Diabetes      1st degree relative    Prior to Admission medications   Medication Sig Start Date End Date Taking? Authorizing Provider  aspirin 81 MG tablet Take 1 tablet (81 mg total) by mouth daily. 07/08/14   Aleksei Plotnikov V, MD  Blood Glucose Monitoring Suppl (PRODIGY BLOOD GLUCOSE MONITOR) DEVI As dirrected 08/22/12   Aleksei Plotnikov V, MD  cholecalciferol (VITAMIN D) 1000 UNITS tablet Take 1 tablet (1,000 Units total) by mouth daily. 04/21/14   Aleksei Plotnikov V, MD  Emollient (EUCERIN) lotion Apply topically as needed for dry skin. Use bid 01/03/16   Aleksei Plotnikov V, MD  finasteride (PROSCAR) 5 MG tablet Take 1 tablet (5 mg total) by mouth daily. For Prostate 01/26/15   Tyrone Apple Plotnikov V, MD  FLUoxetine (PROZAC) 20 MG tablet Take 1 tablet (20 mg total) by mouth daily. 01/03/16   Aleksei Plotnikov V, MD  glimepiride (AMARYL) 2 MG tablet TAKE 1 TABLET TWICE DAILY FOR DIABETES 08/22/15   Aleksei Plotnikov V, MD  glucose blood test strip 1 each by Other route 2 (two) times daily. Use to check blood sugars twice  a day Dx E11.8 01/13/14   Cassandria Anger, MD  Incontinence Supply Disposable (DEPEND FITTED BRIEFS SM/MED) MISC Use tid prn 12/12/15   Cassandria Anger, MD  JANUVIA 50 MG tablet TAKE ONE TABLET BY MOUTH ONCE DAILY 03/08/16   Cassandria Anger, MD  Linaclotide (LINZESS) 290 MCG CAPS capsule Take 1 capsule (290 mcg total) by mouth daily. 07/13/14   Aleksei Plotnikov V, MD  losartan (COZAAR) 50 MG tablet Take 1 tablet (50 mg total) by mouth daily. 08/22/15   Aleksei Plotnikov V, MD  lovastatin (MEVACOR) 20 MG tablet Take 1 tablet  (20 mg total) by mouth daily. For Cholesterol 09/07/15   Aleksei Plotnikov V, MD  metoprolol succinate (TOPROL-XL) 50 MG 24 hr tablet Take 1 tablet (50 mg total) by mouth daily. For Blood Pressure 08/22/15   Aleksei Plotnikov V, MD  polyethylene glycol (MIRALAX / GLYCOLAX) packet Take 17 g by mouth daily. 11/01/15   Flossie Buffy, NP  PRODIGY LANCETS 28G MISC 28 g by Other route 2 (two) times daily. Use to help check blood sugars twice a day Dx E11.8 01/13/14   Lew Dawes V, MD  senna-docusate (SENOKOT-S) 8.6-50 MG tablet Take 1 tablet by mouth at bedtime as needed for mild constipation. 11/01/15   Flossie Buffy, NP  triamcinolone ointment (KENALOG) 0.5 % Apply 1 application topically 2 (two) times daily. On palms 12/12/15 12/11/16  Cassandria Anger, MD    Physical Exam: Constitutional: NAD, calm, comfortable Vitals:   03/13/16 2030 03/13/16 2045 03/13/16 2100 03/13/16 2224  BP: 151/92 189/92 163/90 (!) 193/95  Pulse: 83 87 86 82  Resp: 17 17 20    Temp:    97.7 F (36.5 C)  TempSrc:    Oral  SpO2: 100% 100% 100% 100%  Weight:      Height:       Eyes: Bilateral blindness, lids and conjunctivae normal ENMT: Mucous membranes are moist. Posterior pharynx clear of any exudate or lesions. Neck: normal, supple, no masses, no thyromegaly Respiratory: clear to auscultation bilaterally, no wheezing, no crackles. Normal respiratory effort. No accessory muscle use.  Cardiovascular: Regular rate and rhythm, no murmurs / rubs / gallops. No extremity edema. 2+ pedal pulses. No carotid bruits.  Abdomen: Soft, no tenderness, no masses palpated. No hepatosplenomegaly. Bowel sounds positive.  Musculoskeletal: no clubbing / cyanosis. No joint deformity upper and lower extremities. Good ROM, no contractures. Normal muscle tone.  Skin: no rashes, lesions, ulcers. No induration Neurologic: Bilateral blindness. CN 2-12 grossly intact. Sensation intact, DTR normal. Strength 5/5 in all 4.  Unable to perform nose to finger. Unable to evaluate gait. Psychiatric: Normal judgment and insight. Alert and oriented x 3. Normal mood.   Labs on Admission: I have personally reviewed following labs and imaging studies  CBC:  Recent Labs Lab 03/13/16 1301  WBC 6.2  NEUTROABS 3.1  HGB 13.8  HCT 41.1  MCV 82.5  PLT 749   Basic Metabolic Panel:  Recent Labs Lab 03/13/16 1301  NA 136  K 4.2  CL 106  CO2 24  GLUCOSE 150*  BUN 20  CREATININE 2.10*  CALCIUM 8.7*   GFR: Estimated Creatinine Clearance: 33.1 mL/min (by C-G formula based on SCr of 2.1 mg/dL (H)). Liver Function Tests:  Recent Labs Lab 03/13/16 1301  AST 22  ALT 28  ALKPHOS 73  BILITOT 0.4  PROT 7.6  ALBUMIN 3.5   No results for input(s): LIPASE, AMYLASE in the last 168 hours. No results  for input(s): AMMONIA in the last 168 hours. Coagulation Profile:  Recent Labs Lab 03/13/16 1301  INR 1.06   Cardiac Enzymes: No results for input(s): CKTOTAL, CKMB, CKMBINDEX, TROPONINI in the last 168 hours. BNP (last 3 results) No results for input(s): PROBNP in the last 8760 hours. HbA1C: No results for input(s): HGBA1C in the last 72 hours. CBG: No results for input(s): GLUCAP in the last 168 hours. Lipid Profile: No results for input(s): CHOL, HDL, LDLCALC, TRIG, CHOLHDL, LDLDIRECT in the last 72 hours. Thyroid Function Tests: No results for input(s): TSH, T4TOTAL, FREET4, T3FREE, THYROIDAB in the last 72 hours. Anemia Panel: No results for input(s): VITAMINB12, FOLATE, FERRITIN, TIBC, IRON, RETICCTPCT in the last 72 hours. Urine analysis:    Component Value Date/Time   COLORURINE YELLOW 03/13/2016 1756   APPEARANCEUR CLEAR 03/13/2016 1756   LABSPEC 1.008 03/13/2016 1756   PHURINE 5.0 03/13/2016 1756   GLUCOSEU NEGATIVE 03/13/2016 1756   GLUCOSEU NEGATIVE 11/01/2015 1709   HGBUR SMALL (A) 03/13/2016 1756   BILIRUBINUR NEGATIVE 03/13/2016 1756   KETONESUR NEGATIVE 03/13/2016 1756   PROTEINUR  100 (A) 03/13/2016 1756   UROBILINOGEN 0.2 11/01/2015 1709   NITRITE NEGATIVE 03/13/2016 1756   LEUKOCYTESUR NEGATIVE 03/13/2016 1756    Radiological Exams on Admission: Dg Chest 2 View  Result Date: 03/13/2016 CLINICAL DATA:  Unsteady gait and fall today. History of hypertension, diabetes. EXAM: CHEST  2 VIEW COMPARISON:  PA and lateral chest x-ray of October 27, 2015 FINDINGS: Today's study is obtained in a lordotic projection. The lungs are well-expanded and clear. The heart is top-normal in size. The pulmonary vascularity is normal. The patient has undergone previous CABG. The observed bony thorax is unremarkable. IMPRESSION: There is no active cardiopulmonary disease.  Previous CABG. Electronically Signed   By: Joyice Magda  [place] M.D.   On: 03/13/2016 17:24   Ct Head Wo Contrast  Result Date: 03/13/2016 CLINICAL DATA:  Pt awoke with unsteady gait, stumbling; pt has also developed a Bilateral temporal h/a Pt denies any injury, or prior stroke EXAM: CT HEAD WITHOUT CONTRAST TECHNIQUE: Contiguous axial images were obtained from the base of the skull through the vertex without intravenous contrast. COMPARISON:  None. FINDINGS: Brain: Small area of hypoattenuation noted in the left thalamus, likely a chronic lacune or infarct. No definite acute infarction, hemorrhage, hydrocephalus, extra-axial collection or mass lesion/mass effect. The ventricles and sulci are enlarged reflecting mild to moderate diffuse atrophy. Mild patchy white matter hypoattenuation is noted consistent with chronic microvascular ischemic change. Vascular: No hyperdense vessel or unexpected calcification. Skull: Normal. Negative for fracture or focal lesion. Sinuses/Orbits: No acute orbital abnormality. Visualized sinuses and mastoid air cells are clear. Other: None. IMPRESSION: 1. No acute intracranial abnormalities. 2. Atrophy and mild chronic microvascular ischemic change. Left thalamic lacune infarct. Electronically Signed   By: Lajean Manes M.D.   On: 03/13/2016 14:58   Mr Brain Wo Contrast  Result Date: 03/13/2016 CLINICAL DATA:  Unsteady gait.  Fall today. EXAM: MRI HEAD WITHOUT CONTRAST TECHNIQUE: Multiplanar, multiecho pulse sequences of the brain and surrounding structures were obtained without intravenous contrast. COMPARISON:  CT head 03/13/2016 FINDINGS: Brain: Linear focus of restricted diffusion in the left parietal lobe most consistent with acute infarct involving gray and white matter. This is relatively small measuring approximately 5 x 20 mm. No other area of acute infarct. Generalized atrophy of a moderate degree. Chronic microvascular ischemic change in the white matter and pons. Chronic lacunar infarct in the left thalamus. Small  chronic infarct left cerebellum. Negative for mass. Chronic microhemorrhage in the right posterior temporal lobe, and left thalamus. Small area of susceptibility in the left parietal subarachnoid space which may represent a small area of acute subarachnoid hemorrhage. This is not definitely seen on the CT scan. No subdural fluid collection. Vascular: Normal arterial flow void. Skull and upper cervical spine: Negative Sinuses/Orbits: Negative Other: None IMPRESSION: Linear focus of restricted diffusion in the left parietal lobe most consistent with acute infarct although somewhat unusual in shape. Small area of subarachnoid hemorrhage in the left parietal region which could be acute. This is not seen on the CT. Atrophy and chronic microvascular ischemic changes as above. Electronically Signed   By: Franchot Gallo M.D.   On: 03/13/2016 19:58    EKG: Independently reviewed.  Vent. rate 85 BPM PR interval 158 ms QRS duration 98 ms QT/QTc 392/466 ms P-R-T axes 68 -10 63 Normal sinus rhythm Inferior infarct , age undetermined Abnormal ECG  Assessment/Plan Principal Problem:   Acute cerebrovascular accident (CVA) ([place]) Admit to telemetry a/inpatient. Frequent neuro checks. PT, OT and  speech evaluation in a.m. Check hemoglobin A1c and fasting lipids in a.m. Check echocardiogram and carotid Doppler in a.m. Neurology following.  Active Problems:   Hyperlipidemia Pravastatin 20 mg by mouth daily.    Coronary atherosclerosis Continue aspirin, beta blocker and statin.    GERD Protonix 40 mg by mouth daily.    DM (diabetes mellitus), type 2 with ophthalmic complications (HCC) carbohydrate modified diet. Januvia 50 mg by mouth daily. Continue Amaryl 2 mg by mouth twice a day. CBG monitoring with regular insulin sliding scale while in the hospital.    Chronic kidney disease (CKD), stage IV (severe) (HCC) Stable. Monitor BUN, creatinine and electrolytes.    Constipation Continue Linzess, MiraLAX, Senokot.    Situational mixed anxiety and depressive disorder Continue fluoxetine 20 mg by mouth daily.    Essential hypertension Continue metoprolol 50 mg by mouth daily Continue losartan 50 mg by mouth daily Monitor blood pressure.     DVT prophylaxis: SCDs. Code Status: Full code. Family Communication: His son was present in the emergency department. Disposition Plan: Admit for CVA workup. Consults called: Neurology Dr. Wallie Char. Admission status: Inpatient/telemetry.   Reubin Milan MD [facility] Pager [phone].  If 7PM-7AM, please contact night-coverage [email or web address] Password [email or web address]  03/13/2016, 11:17 PM

## 2016-03-13 NOTE — Telephone Encounter (Signed)
Patient Name: Caleb Dunn  DOB: May 19, 1940    Initial Comment Caller states he is staggering a lot when standing.    Nurse Assessment  Nurse: Julien Girt, RN, Almyra Free Date/Time Eilene Ghazi Time): 03/13/2016 11:51:05 AM  Confirm and document reason for call. If symptomatic, describe symptoms. ---Caller states he is "staggering" this morning, can hardly walk around. He is having to hold on to the walls, furniture and using his cane. Caller reports he is blind.  Does the patient have any new or worsening symptoms? ---Yes  Will a triage be completed? ---Yes  Related visit to physician within the last 2 weeks? ---No  Does the PT have any chronic conditions? (i.e. diabetes, asthma, etc.) ---Yes  List chronic conditions. ---Diabetic, Heart Disease, Blind, Kidney Disease, GERD  Is this a behavioral health or substance abuse call? ---No     Guidelines    Guideline Title Affirmed Question Affirmed Notes  Dizziness - Vertigo SEVERE dizziness (vertigo) (e.g., unable to walk without assistance)    Final Disposition User   Go to ED Now (or PCP triage) Julien Girt, RN, [facility] - ED       Disagree/Comply: Comply   I attempted to acll patient back to assure he had transportation to the ED, obtained vm twice.

## 2016-03-13 NOTE — ED Triage Notes (Signed)
Patient complains of unsteady gait upon awakening this am. States that he fell due to same, no loc. No distress. Alert and oriented, patient is totally blind. No facial droop, grip strength normal and no arm dfift

## 2016-03-14 ENCOUNTER — Inpatient Hospital Stay (HOSPITAL_COMMUNITY): Payer: Medicare HMO

## 2016-03-14 ENCOUNTER — Encounter (HOSPITAL_COMMUNITY): Payer: Self-pay | Admitting: *Deleted

## 2016-03-14 ENCOUNTER — Telehealth: Payer: Self-pay | Admitting: Internal Medicine

## 2016-03-14 DIAGNOSIS — I639 Cerebral infarction, unspecified: Secondary | ICD-10-CM

## 2016-03-14 DIAGNOSIS — N184 Chronic kidney disease, stage 4 (severe): Secondary | ICD-10-CM

## 2016-03-14 DIAGNOSIS — E1139 Type 2 diabetes mellitus with other diabetic ophthalmic complication: Secondary | ICD-10-CM

## 2016-03-14 DIAGNOSIS — I6789 Other cerebrovascular disease: Secondary | ICD-10-CM

## 2016-03-14 DIAGNOSIS — E785 Hyperlipidemia, unspecified: Secondary | ICD-10-CM

## 2016-03-14 LAB — COMPREHENSIVE METABOLIC PANEL
ALT: 23 U/L (ref 17–63)
AST: 18 U/L (ref 15–41)
Albumin: 3.1 g/dL — ABNORMAL LOW (ref 3.5–5.0)
Alkaline Phosphatase: 68 U/L (ref 38–126)
Anion gap: 6 (ref 5–15)
BUN: 17 mg/dL (ref 6–20)
CHLORIDE: 106 mmol/L (ref 101–111)
CO2: 24 mmol/L (ref 22–32)
Calcium: 8.7 mg/dL — ABNORMAL LOW (ref 8.9–10.3)
Creatinine, Ser: 2 mg/dL — ABNORMAL HIGH (ref 0.61–1.24)
GFR calc Af Amer: 36 mL/min — ABNORMAL LOW (ref >60)
GFR, EST NON AFRICAN AMERICAN: 31 mL/min — AB (ref >60)
Glucose, Bld: 160 mg/dL — ABNORMAL HIGH (ref 65–99)
POTASSIUM: 3.8 mmol/L (ref 3.5–5.1)
Sodium: 136 mmol/L (ref 135–145)
Total Bilirubin: 0.4 mg/dL (ref 0.3–1.2)
Total Protein: 7 g/dL (ref 6.5–8.1)

## 2016-03-14 LAB — LIPID PANEL
CHOL/HDL RATIO: 3.2 ratio
CHOLESTEROL: 132 mg/dL (ref 0–200)
HDL: 41 mg/dL (ref >40)
LDL Cholesterol: 76 mg/dL (ref 0–99)
TRIGLYCERIDES: 75 mg/dL (ref <150)
VLDL: 15 mg/dL (ref 0–40)

## 2016-03-14 LAB — GLUCOSE, CAPILLARY
GLUCOSE-CAPILLARY: 169 mg/dL — AB (ref 65–99)
Glucose-Capillary: 116 mg/dL — ABNORMAL HIGH (ref 65–99)
Glucose-Capillary: 132 mg/dL — ABNORMAL HIGH (ref 65–99)
Glucose-Capillary: 126 mg/dL — ABNORMAL HIGH (ref 65–99)

## 2016-03-14 LAB — VAS US CAROTID
LCCAPDIAS: 6 cm/s
LEFT ECA DIAS: -8 cm/s
LEFT VERTEBRAL DIAS: 16 cm/s
LICADDIAS: -18 cm/s
LICAPDIAS: -23 cm/s
LICAPSYS: -121 cm/s
Left CCA dist dias: -18 cm/s
Left CCA dist sys: -95 cm/s
Left CCA prox sys: 103 cm/s
Left ICA dist sys: -69 cm/s
RIGHT ECA DIAS: -11 cm/s
RIGHT VERTEBRAL DIAS: 11 cm/s
Right CCA prox dias: 13 cm/s
Right CCA prox sys: 62 cm/s
Right cca dist sys: -58 cm/s

## 2016-03-14 LAB — ECHOCARDIOGRAM COMPLETE
Height: 66 in
Weight: 3520 oz

## 2016-03-14 MED ORDER — LABETALOL HCL 5 MG/ML IV SOLN: 20.0000 mg | INTRAVENOUS | Status: DC | PRN

## 2016-03-14 MED ORDER — PRAVASTATIN SODIUM 20 MG PO TABS
20.0000 mg | ORAL_TABLET | Freq: Every day | ORAL | Status: DC
  Administered 2016-03-14 – 2016-03-15 (×2): 20 mg via ORAL
  Filled 2016-03-14 (×2): qty 1

## 2016-03-14 MED ORDER — GLIMEPIRIDE 4 MG PO TABS
2.0000 mg | ORAL_TABLET | Freq: Two times a day (BID) | ORAL | Status: DC
  Administered 2016-03-14 (×2): 2 mg via ORAL
  Filled 2016-03-14 (×3): qty 1

## 2016-03-14 MED ORDER — LINAGLIPTIN 5 MG PO TABS
5.0000 mg | ORAL_TABLET | Freq: Every day | ORAL | Status: DC
  Administered 2016-03-14 – 2016-03-16 (×3): 5 mg via ORAL
  Filled 2016-03-14 (×3): qty 1

## 2016-03-14 MED ORDER — METOPROLOL SUCCINATE ER 25 MG PO TB24
50.0000 mg | ORAL_TABLET | Freq: Every day | ORAL | Status: DC
  Administered 2016-03-14: 50 mg via ORAL
  Filled 2016-03-14: qty 2

## 2016-03-14 MED ORDER — PANTOPRAZOLE SODIUM 40 MG PO TBEC
40.0000 mg | DELAYED_RELEASE_TABLET | Freq: Every day | ORAL | Status: DC
  Administered 2016-03-14 – 2016-03-16 (×3): 40 mg via ORAL
  Filled 2016-03-14 (×3): qty 1

## 2016-03-14 MED ORDER — METOPROLOL SUCCINATE ER 25 MG PO TB24
75.0000 mg | ORAL_TABLET | Freq: Every day | ORAL | Status: DC
  Administered 2016-03-15 – 2016-03-16 (×2): 75 mg via ORAL
  Filled 2016-03-14 (×2): qty 3

## 2016-03-14 MED ORDER — TRIAMCINOLONE ACETONIDE 0.5 % EX OINT
1.0000 "application " | TOPICAL_OINTMENT | Freq: Two times a day (BID) | CUTANEOUS | Status: DC
  Administered 2016-03-14 – 2016-03-16 (×4): 1 via TOPICAL
  Filled 2016-03-14 (×2): qty 15

## 2016-03-14 MED ORDER — POLYETHYLENE GLYCOL 3350 17 G PO PACK
17.0000 g | PACK | Freq: Every day | ORAL | Status: DC
  Administered 2016-03-14 – 2016-03-16 (×2): 17 g via ORAL
  Filled 2016-03-14 (×3): qty 1

## 2016-03-14 MED ORDER — FINASTERIDE 5 MG PO TABS
5.0000 mg | ORAL_TABLET | Freq: Every day | ORAL | Status: DC
  Administered 2016-03-14 – 2016-03-16 (×3): 5 mg via ORAL
  Filled 2016-03-14 (×3): qty 1

## 2016-03-14 MED ORDER — PERFLUTREN LIPID MICROSPHERE
1.0000 mL | INTRAVENOUS | Status: AC | PRN
  Administered 2016-03-14: 2 mL via INTRAVENOUS
  Filled 2016-03-14: qty 10

## 2016-03-14 MED ORDER — ASPIRIN EC 81 MG PO TBEC
81.0000 mg | DELAYED_RELEASE_TABLET | Freq: Every day | ORAL | Status: DC
  Administered 2016-03-14: 81 mg via ORAL
  Filled 2016-03-14: qty 1

## 2016-03-14 MED ORDER — EUCERIN EX LOTN: TOPICAL_LOTION | CUTANEOUS | Status: DC | PRN

## 2016-03-14 MED ORDER — ASPIRIN EC 325 MG PO TBEC
325.0000 mg | DELAYED_RELEASE_TABLET | Freq: Every day | ORAL | Status: DC
  Administered 2016-03-15 – 2016-03-16 (×2): 325 mg via ORAL
  Filled 2016-03-14 (×2): qty 1

## 2016-03-14 MED ORDER — SENNOSIDES-DOCUSATE SODIUM 8.6-50 MG PO TABS: 1.0000 | ORAL_TABLET | Freq: Every evening | ORAL | Status: DC | PRN

## 2016-03-14 MED ORDER — LOSARTAN POTASSIUM 50 MG PO TABS
50.0000 mg | ORAL_TABLET | Freq: Every day | ORAL | Status: DC
  Administered 2016-03-14 – 2016-03-16 (×3): 50 mg via ORAL
  Filled 2016-03-14 (×3): qty 1

## 2016-03-14 MED ORDER — METOPROLOL SUCCINATE ER 25 MG PO TB24
25.0000 mg | ORAL_TABLET | Freq: Once | ORAL | Status: AC
Start: 2016-03-14 — End: 2016-03-14
  Administered 2016-03-14: 25 mg via ORAL
  Filled 2016-03-14: qty 1

## 2016-03-14 MED ORDER — FLUOXETINE HCL 20 MG PO CAPS
20.0000 mg | ORAL_CAPSULE | Freq: Every day | ORAL | Status: DC
  Administered 2016-03-14 – 2016-03-16 (×3): 20 mg via ORAL
  Filled 2016-03-14 (×3): qty 1

## 2016-03-14 MED ORDER — LINACLOTIDE 145 MCG PO CAPS
290.0000 ug | ORAL_CAPSULE | Freq: Every day | ORAL | Status: DC
  Administered 2016-03-14 – 2016-03-16 (×3): 290 ug via ORAL
  Filled 2016-03-14 (×3): qty 1

## 2016-03-14 NOTE — Progress Notes (Signed)
*  PRELIMINARY RESULTS* Vascular Ultrasound Carotid Duplex (Doppler) has been completed.  Preliminary findings: Bilateral: No significant (1-39%) ICA stenosis. Antegrade vertebral flow.   Landry Mellow, RDMS, RVT  03/14/2016, 10:16 AM

## 2016-03-14 NOTE — Evaluation (Signed)
Physical Therapy Evaluation Patient Details Name: Caleb Brager Sr. MRN: [id number] DOB: 22-Nov-1940 Today's Date: 03/14/2016   History of Present Illness  Pt is a 76 y.o. male who presented to the ED due to unsteady gait. Pt has a past medical history of blindness, CAD< cholelithiasis, carpal tunnel syndrome, diabetes mellitus with diabetic peripheral neuropathy, ED, elevated PSA, GERD, glaucoma, and hypertension. MRI revealed linear abnormal finding consistent with acute parietal infarction.  Clinical Impression  Pt admitted with/for gait instability.  Found to have L parietal infarct.  Pt currently limited functionally due to the problems listed. ( See problems list.)   Pt will benefit from PT to maximize function and safety in order to get ready for next venue listed below.     Follow Up Recommendations CIR    Equipment Recommendations  None recommended by PT    Recommendations for Other Services Rehab consult     Precautions / Restrictions Precautions Precautions: Fall Restrictions Weight Bearing Restrictions: No      Mobility  Bed Mobility Overal bed mobility: Needs Assistance Bed Mobility: Supine to Sit     Supine to sit: Min assist     General bed mobility comments: OOB  Transfers Overall transfer level: Needs assistance Equipment used: Straight cane Transfers: Sit to/from Stand Sit to Stand: Min guard         General transfer comment: Min guard assist for initial sit<>stand.  Then steady assist once he released the arm.  Ambulation/Gait Ambulation/Gait assistance: Min assist;Mod assist Ambulation Distance (Feet): 90 Feet Assistive device: Straight cane;1 person hand held assist Gait Pattern/deviations: Step-through pattern;Shuffle Gait velocity: slower Gait velocity interpretation: Below normal speed for age/gender General Gait Details: consistent turning or bearing left even if pt had hold of a surface such as foot board or rail in the hall.  pt  unable to easily distinguish tactile input to turn or stay straight and did not follow directional cues well.  Stairs            Wheelchair Mobility    Modified Rankin (Stroke Patients Only) Modified Rankin (Stroke Patients Only) Pre-Morbid Rankin Score: Moderate disability Modified Rankin: Moderately severe disability     Balance Overall balance assessment: Needs assistance Sitting-balance support: No upper extremity supported;Feet supported Sitting balance-Leahy Scale: Good Sitting balance - Comments: Able to don/doff socks sitting at EOB without back support.   Standing balance support: Single extremity supported;Bilateral upper extremity supported;During functional activity Standing balance-Leahy Scale: Poor Standing balance comment: Able to stand with single UE support for static tasks but required B UE support for functional mobility.                             Pertinent Vitals/Pain Pain Assessment: No/denies pain Pain Intervention(s): Monitored during session    Home Living Family/patient expects to be discharged to:: Private residence (Independent living facility) Living Arrangements: Alone Available Help at Discharge: Family;Available PRN/intermittently (daughters) Type of Home: Apartment Home Access: Level entry     Home Layout: One level Home Equipment: Cane - single point      Prior Function Level of Independence: Needs assistance   Gait / Transfers Assistance Needed: Utilizing cane  ADL's / Homemaking Assistance Needed: Daughters assist with financial management, cooking, cleaning, and providing set-up for ADL.        Hand Dominance   Dominant Hand: Right    Extremity/Trunk Assessment   Upper Extremity Assessment Upper Extremity Assessment: Defer to OT evaluation  RUE Deficits / Details: Slightly decreased strength in RUE (grossly 4+/5) as compared to L. RUE Coordination:  (Reports overshooting/undershooting but not noted on  eval.)    Lower Extremity Assessment Lower Extremity Assessment: Overall WFL for tasks assessed (R LE with hip flexor weakness)       Communication   Communication: No difficulties  Cognition Arousal/Alertness: Awake/alert Behavior During Therapy: WFL for tasks assessed/performed Overall Cognitive Status: Within Functional Limits for tasks assessed                 General Comments: For tasks assessed, cognition was [facility]. However, plan to continue to assess higher level problem solving and sequencing skills.    General Comments General comments (skin integrity, edema, etc.): Having difficulty parsing out what is visual versus spatial problems    Exercises     Assessment/Plan    PT Assessment Patient needs continued PT services  PT Problem List Decreased strength;Decreased activity tolerance;Decreased balance;Decreased mobility;Decreased coordination       PT Treatment Interventions Gait training;Functional mobility training;Therapeutic activities;DME instruction;Balance training;Patient/family education    PT Goals (Current goals can be found in the Care Plan section)  Acute Rehab PT Goals Patient Stated Goal: to go home and walk better PT Goal Formulation: With patient Time For Goal Achievement: 03/28/16 Potential to Achieve Goals: Good    Frequency Min 3X/week   Barriers to discharge Decreased caregiver support family not able to stay with pt nor does pt wish anyone to stay with him.    Co-evaluation               End of Session   Activity Tolerance: Patient tolerated treatment well Patient left: in chair;with call bell/phone within reach;with family/visitor present;with chair alarm set Nurse Communication: Mobility status PT Visit Diagnosis: Unsteadiness on feet (R26.81);Other abnormalities of gait and mobility (R26.89)         Time: 1740-1806 PT Time Calculation (min) (ACUTE ONLY): 26 min   Charges:   PT Evaluation $PT Eval Moderate  Complexity: 1 Procedure PT Treatments $Gait Training: 8-22 mins   PT G CodesTessie Fass Julia Kulzer 03/14/2016, 6:24 PM 03/14/2016  Donnella Sham, PT [phone]  (pager)

## 2016-03-14 NOTE — Telephone Encounter (Signed)
Pt's daughter called request to speak to the assistant concern about home health. Caleb Dunn was in the hospital again recently and now they saying he can not stay home alone. Please call her back.

## 2016-03-14 NOTE — Progress Notes (Signed)
Patient's current BP 193/55, RN paged provider.  Received new orders to notify provider with systolic BP for amounts greater than 200.  RN will continue to monitor.

## 2016-03-14 NOTE — Progress Notes (Addendum)
Patients current BP is 202/100.  Paged provider.  Provider entered new orders to notify provider if systolic BP greater than 453.  RN will continue to monitor.

## 2016-03-14 NOTE — Progress Notes (Signed)
Rehab Admissions Coordinator Note:  Patient was screened by Cleatrice Burke for appropriateness for an Inpatient Acute Rehab Consult per PT recommendation.  At this time, we are recommending Inpatient Rehab consult. Please place order for consult.  Cleatrice Burke 03/14/2016, 8:25 PM  I can be reached at [phone].

## 2016-03-14 NOTE — Progress Notes (Signed)
STROKE TEAM PROGRESS NOTE   SUBJECTIVE (INTERVAL HISTORY) No family is at the bedside.  Patient reports he is legally blind, but does not fall. He gets around well at home without difficulty.   OBJECTIVE Temp:  [97.7 F (36.5 C)-98.4 F (36.9 C)] 97.8 F (36.6 C) (02/21 0900) Pulse Rate:  [73-93] 88 (02/21 0900) Cardiac Rhythm: Normal sinus rhythm (02/21 0700) Resp:  [14-20] 19 (02/21 0900) BP: (151-202)/(83-100) 176/84 (02/21 0900) SpO2:  [98 %-100 %] 98 % (02/21 0830) Weight:  [99.8 kg (220 lb)] 99.8 kg (220 lb) (02/20 1249)  CBC:  Recent Labs Lab 03/13/16 1301  WBC 6.2  NEUTROABS 3.1  HGB 13.8  HCT 41.1  MCV 82.5  PLT 809    Basic Metabolic Panel:  Recent Labs Lab 03/13/16 1301 03/14/16 0321  NA 136 136  K 4.2 3.8  CL 106 106  CO2 24 24  GLUCOSE 150* 160*  BUN 20 17  CREATININE 2.10* 2.00*  CALCIUM 8.7* 8.7*    HgbA1c:  Lab Results  Component Value Date   HGBA1C 7.5 (H) 12/08/2015     PHYSICAL EXAM Elderly african [place] male not in distress. . Afebrile. Head is nontraumatic. Neck is supple without bruit.    Cardiac exam no murmur or gallop. Lungs are clear to auscultation. Distal pulses are well felt. Neurological Exam :  Awake alert oriented 3 with normal speech and language function. No aphasia or apraxia dysarthria. Patient is legally blind. Right pupil 3 mm sluggishly reactive. Left pupil has corneal opacity and is unreactive. Patient can perceive   light.but unable to finger count. Extraocular moments are full range but showed some secondary dysmetria. Nystagmus of the blind is present. Face is symmetric without weakness. Tongue midline. Motor system exam no upper or lower extremity drift. Symmetric and equal strength in all 4 extremities. No focal weakness. Deep tendon reflexes are depressed. Plantars are downgoing. Sensation is intact. Gait was not tested. ASSESSMENT/PLAN Mr. Caleb Minjares Sr. is a 76 y.o. male with history of legally blind,  CAD, cholelithiasis, carpal tunnel byndrome, DM type 2 with PN, ED, elevated PSA, GERD, glaucoma, HTN  presenting with weakness, dizziness and unsteady gait. He did not receive IV t-PA due to being beyond treatment window for consideration and small SAH.   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 Left thalamic lacunar infarct  MRI  linear L parietal lobe acute infarct. Small L parietal SAH  TCD pending  Carotid Doppler  bilateral ICA 1-39%, no significant stenosis  2D Echo  pending   LDL 76  HgbA1c pending  SCDs for VTE prophylaxis  Diet heart healthy/carb modified Room service appropriate? Yes; Fluid consistency: Thin  aspirin 81 mg daily prior to admission, now on aspirin 81 mg daily. Changed to aspirin 325 mg daily, continue at discharge  Given baseline blindness and potential for fall, patient is not felt to be a good anticoagulation candidate, therefore, will not pursue further embolic workup  Therapy recommendations:  pending   Disposition:  pending   Hypertension  Elevated, as high as 202/100   Given secondary SAH, Systolic blood pressure goal of 140-180  labetalol prn  Discussed with Caleb Dunn is no murmur  Long-term BP goal normotensive  Hyperlipidemia  Home meds:  Mevacor 20, resumed in hospital as Pravachol 20 (formulary change)  LDL just above  goal  Continue statin at discharge  Diabetes type II  HgbA1c goal < 7.0  Other Stroke Risk Factors  Advanced age  Former Cigarette smoker  No UDS  Obesity, Body mass index is 35.51 kg/m.  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 Agree with note above. He presented with weakness, dizziness and unsteady gait due to embolic left MCA branch infarct. Patient is legally blind and fall risk and is not a good long-term anticoagulation candidate hence we will not consider TEE and loop recorder. Keep systolic blood pressure goal 140-180. Resume home medications Check   remaining stroke workup. Change aspirin dose from 81 to 325 mg daily. Discussed with Dr. Maryland Pink Greater than 50% time during this 35 minute visit was spent on counseling and coordination of care about stroke risk, prevention and treatment discussion.  Caleb Contras, MD Medical Director [facility] Pager: [phone] 03/14/2016 2:54 PM  To contact Stroke Continuity provider, please refer to [email or web address] After hours, contact General Neurology

## 2016-03-14 NOTE — Progress Notes (Signed)
[facility] PROGRESS NOTE  Caleb Dunn. WUX:[id number] DOB: 04/15/1940 DOA: 03/13/2016  PCP: Walker Kehr, MD  Brief History/Interval Summary: 76 year old African-American male with a past medical history of blindness, coronary artery disease, cholelithiasis, carpal tunnel syndrome, type 2 diabetes with peripheral neuropathy, hypertension, who was brought into the emergency department to unsteady gait. Patient found to have acute stroke. He was hospitalized.  Reason for Visit: Acute stroke  Consultants: Neurology  Procedures:  Transthoracic echocardiogram is pending  Carotid Duplex (Doppler).  Preliminary findings: Bilateral: No significant (1-39%) ICA stenosis. Antegrade vertebral flow.   Antibiotics: None  Subjective/Interval History: Patient denies any complaints. Denies any headaches. No nausea or vomiting. Denies any weakness in his arms or legs.  ROS: Denies any chest pain or shortness of breath.  Objective:  Vital Signs  Vitals:   03/14/16 0434 03/14/16 0640 03/14/16 0830 03/14/16 0900  BP: (!) 177/94 (!) 173/88 (!) 190/94 (!) 176/84  Pulse: 83 84 93 88  Resp: 20 20  19   Temp: 98.1 F (36.7 C)   97.8 F (36.6 C)  TempSrc: Oral  Oral Oral  SpO2: 100% 100% 98%   Weight:      Height:        Intake/Output Summary (Last 24 hours) at 03/14/16 1342 Last data filed at 03/14/16 2536  Gross per 24 hour  Intake                0 ml  Output              600 ml  Net             -600 ml   Filed Weights   03/13/16 1249  Weight: 99.8 kg (220 lb)    General appearance: alert, cooperative, appears stated age and no distress Resp: clear to auscultation bilaterally Cardio: regular rate and rhythm, S1, S2 normal, no murmur, click, rub or gallop GI: soft, non-tender; bowel sounds normal; no masses,  no organomegaly Extremities: extremities normal, atraumatic, no cyanosis or edema Neurologic: Awake and alert. Oriented 3. Strength equal bilateral upper  and lower extremities. Gait not assessed.  Lab Results:  Data Reviewed: I have personally reviewed following labs and imaging studies  CBC:  Recent Labs Lab 03/13/16 1301  WBC 6.2  NEUTROABS 3.1  HGB 13.8  HCT 41.1  MCV 82.5  PLT 644    Basic Metabolic Panel:  Recent Labs Lab 03/13/16 1301 03/14/16 0321  NA 136 136  K 4.2 3.8  CL 106 106  CO2 24 24  GLUCOSE 150* 160*  BUN 20 17  CREATININE 2.10* 2.00*  CALCIUM 8.7* 8.7*    GFR: Estimated Creatinine Clearance: 34.8 mL/min (by C-G formula based on SCr of 2 mg/dL (H)).  Liver Function Tests:  Recent Labs Lab 03/13/16 1301 03/14/16 0321  AST 22 18  ALT 28 23  ALKPHOS 73 68  BILITOT 0.4 0.4  PROT 7.6 7.0  ALBUMIN 3.5 3.1*     Coagulation Profile:  Recent Labs Lab 03/13/16 1301  INR 1.06    CBG:  Recent Labs Lab 03/14/16 0853 03/14/16 1204  GLUCAP 169* 116*    Lipid Profile:  Recent Labs  03/14/16 0321  CHOL 132  HDL 41  LDLCALC 76  TRIG 75  CHOLHDL 3.2     Radiology Studies: Dg Chest 2 View  Result Date: 03/13/2016 CLINICAL DATA:  Unsteady gait and fall today. History of hypertension, diabetes. EXAM: CHEST  2 VIEW COMPARISON:  PA and lateral  chest x-ray of October 27, 2015 FINDINGS: Today's study is obtained in a lordotic projection. The lungs are well-expanded and clear. The heart is top-normal in size. The pulmonary vascularity is normal. The patient has undergone previous CABG. The observed bony thorax is unremarkable. IMPRESSION: There is no active cardiopulmonary disease.  Previous CABG. Electronically Signed   By: David  Martinique M.D.   On: 03/13/2016 17:24   Ct Head Wo Contrast  Result Date: 03/13/2016 CLINICAL DATA:  Pt awoke with unsteady gait, stumbling; pt has also developed a Bilateral temporal h/a Pt denies any injury, or prior stroke EXAM: CT HEAD WITHOUT CONTRAST TECHNIQUE: Contiguous axial images were obtained from the base of the skull through the vertex without  intravenous contrast. COMPARISON:  None. FINDINGS: Brain: Small area of hypoattenuation noted in the left thalamus, likely a chronic lacune or infarct. No definite acute infarction, hemorrhage, hydrocephalus, extra-axial collection or mass lesion/mass effect. The ventricles and sulci are enlarged reflecting mild to moderate diffuse atrophy. Mild patchy white matter hypoattenuation is noted consistent with chronic microvascular ischemic change. Vascular: No hyperdense vessel or unexpected calcification. Skull: Normal. Negative for fracture or focal lesion. Sinuses/Orbits: No acute orbital abnormality. Visualized sinuses and mastoid air cells are clear. Other: None. IMPRESSION: 1. No acute intracranial abnormalities. 2. Atrophy and mild chronic microvascular ischemic change. Left thalamic lacune infarct. Electronically Signed   By: Lajean Manes M.D.   On: 03/13/2016 14:58   Mr Brain Wo Contrast  Result Date: 03/13/2016 CLINICAL DATA:  Unsteady gait.  Fall today. EXAM: MRI HEAD WITHOUT CONTRAST TECHNIQUE: Multiplanar, multiecho pulse sequences of the brain and surrounding structures were obtained without intravenous contrast. COMPARISON:  CT head 03/13/2016 FINDINGS: Brain: Linear focus of restricted diffusion in the left parietal lobe most consistent with acute infarct involving gray and white matter. This is relatively small measuring approximately 5 x 20 mm. No other area of acute infarct. Generalized atrophy of a moderate degree. Chronic microvascular ischemic change in the white matter and pons. Chronic lacunar infarct in the left thalamus. Small chronic infarct left cerebellum. Negative for mass. Chronic microhemorrhage in the right posterior temporal lobe, and left thalamus. Small area of susceptibility in the left parietal subarachnoid space which may represent a small area of acute subarachnoid hemorrhage. This is not definitely seen on the CT scan. No subdural fluid collection. Vascular: Normal arterial  flow void. Skull and upper cervical spine: Negative Sinuses/Orbits: Negative Other: None IMPRESSION: Linear focus of restricted diffusion in the left parietal lobe most consistent with acute infarct although somewhat unusual in shape. Small area of subarachnoid hemorrhage in the left parietal region which could be acute. This is not seen on the CT. Atrophy and chronic microvascular ischemic changes as above. Electronically Signed   By: Franchot Gallo M.D.   On: 03/13/2016 19:58     Medications:  Scheduled: . [START ON 03/15/2016] aspirin EC  325 mg Oral Daily  . finasteride  5 mg Oral Daily  . FLUoxetine  20 mg Oral Daily  . glimepiride  2 mg Oral BID AC  . insulin aspart  0-9 Units Subcutaneous TID WC  . linaclotide  290 mcg Oral Daily  . linagliptin  5 mg Oral Daily  . losartan  50 mg Oral Daily  . metoprolol succinate  50 mg Oral Daily  . pantoprazole  40 mg Oral Daily  . polyethylene glycol  17 g Oral Daily  . pravastatin  20 mg Oral q1800  . sodium chloride flush  3  mL Intravenous Q12H  . triamcinolone ointment  1 application Topical BID   Continuous:  WVP:[id number] lipid microspheres (DEFINITY) IV suspension, senna-docusate  Assessment/Plan:  Principal Problem:   Acute cerebrovascular accident (CVA) ([place]) Active Problems:   Hyperlipidemia   Coronary atherosclerosis   GERD   DM (diabetes mellitus), type 2 with ophthalmic complications (HCC)   Chronic kidney disease (CKD), stage IV (severe) (HCC)   Constipation   Situational mixed anxiety and depressive disorder   Essential hypertension    Acute cerebrovascular accident (CVA) with parietal infarct as well as subarachnoid hemorrhage Neurology is following. Echocardiogram is pending. Carotid Doppler without any significant stenosis. LDL is 76. Patient is on a statin. PT evaluation pending. Further management per neurology. Antiplatelets per neurology.  Hyperlipidemia Pravastatin 20 mg by mouth daily.  History of  Coronary atherosclerosis Stable. Continue aspirin, beta blocker and statin.  GERD Protonix 40 mg by mouth daily.  DM (diabetes mellitus), type 2 with ophthalmic complications Continue to monitor CBGs. Sliding scale insulin coverage. Home medications are being continued. Last HbA1c was 7.5 in November.  Chronic kidney disease (CKD), stage IV (severe) Stable. Follow labs periodically. Monitor urine output.  Constipation Continue Linzess, MiraLAX, Senokot.  Situational mixed anxiety and depressive disorder Continue fluoxetine 20 mg by mouth daily.  Essential hypertension Allowing permissive hypertension to some extent. Continue metoprolol 50 mg by mouth daily. Continue losartan 50 mg by mouth daily.   DVT Prophylaxis: SCDs    Code Status: Full code  Family Communication: Discussed with patient  Disposition Plan: Management per neurology. Await PT evaluation. Await echocardiogram.    LOS: 1 day   [facility] Pager [phone] 03/14/2016, 1:42 PM  If 7PM-7AM, please contact night-coverage at [email or web address], password [facility]

## 2016-03-14 NOTE — Care Management Note (Signed)
Case Management Note  Patient Details  Name: Caleb Serviss Sr. MRN: [id number] Date of Birth: [birth year]  Subjective/Objective:            Patient was admitted with CVA. Lives at home alone. CM will follow for discharge needs pending PT/OT evals and physician orders.         Action/Plan:   Expected Discharge Date:                  Expected Discharge Plan:     In-House Referral:     Discharge planning Services     Post Acute Care Choice:    Choice offered to:     DME Arranged:    DME Agency:     HH Arranged:    HH Agency:     Status of Service:     If discussed at [employer] of Stay Meetings, dates discussed:    Additional Comments:  Rolm Baptise, RN 03/14/2016, 10:58 AM

## 2016-03-14 NOTE — Evaluation (Signed)
Occupational Therapy Evaluation Patient Details Name: Caleb Hanrahan Sr. MRN: [id number] DOB: 1940-09-12 Today's Date: 03/14/2016    History of Present Illness Pt is a 76 y.o. male who presented to the ED due to unsteady gait. Pt has a past medical history of blindness, CAD< cholelithiasis, carpal tunnel syndrome, diabetes mellitus with diabetic peripheral neuropathy, ED, elevated PSA, GERD, glaucoma, and hypertension. MRI revealed linear abnormal finding consistent with acute parietal infarction.   Clinical Impression   PTA, pt was receiving assistance for home management tasks from his daughters and was using a cane for functional mobility. He was able to complete basic ADL after set-up with no physical assistance. Pt does live alone in an independent living apartment. Pt currently requiring mod assist with toilet transfers and min assist for standing ADL. He is blind at baseline and this is likely impacting his independence with ADL in this unfamiliar environment. Pt has good family support from his children. Feel he will likely progress to be able to D/C home with home health OT and aide. Will continue to update D/C recommendations as appropriate. OT will continue to follow acutely with focus on safety during standing ADL tasks.    Follow Up Recommendations  Home health OT;Supervision/Assistance - 24 hour (would benefit from aide to assist with ADL when family unavailable)   Equipment Recommendations  3 in 1 bedside commode    Recommendations for Other Services       Precautions / Restrictions Precautions Precautions: Fall Restrictions Weight Bearing Restrictions: No      Mobility Bed Mobility Overal bed mobility: Needs Assistance Bed Mobility: Supine to Sit     Supine to sit: Min assist     General bed mobility comments: Son providing min assist to raise trunk to sit at EOB.  Transfers Overall transfer level: Needs assistance Equipment used: Straight cane Transfers:  Sit to/from Stand Sit to Stand: Min guard         General transfer comment: Min guard assist for initial sit<>stand.    Balance Overall balance assessment: Needs assistance Sitting-balance support: No upper extremity supported;Feet supported Sitting balance-Leahy Scale: Good Sitting balance - Comments: Able to don/doff socks sitting at EOB without back support.   Standing balance support: Single extremity supported;Bilateral upper extremity supported;During functional activity Standing balance-Leahy Scale: Poor Standing balance comment: Able to stand with single UE support for static tasks but required B UE support for functional mobility.                            ADL Overall ADL's : Needs assistance/impaired Eating/Feeding: Set up;Sitting   Grooming: Set up;Sitting   Upper Body Bathing: Set up;Sitting   Lower Body Bathing: Minimal assistance;Sit to/from stand   Upper Body Dressing : Set up;Sitting   Lower Body Dressing: Minimal assistance;Sit to/from stand   Toilet Transfer: Moderate assistance;BSC;Ambulation Toilet Transfer Details (indicate cue type and reason): Simulated with sit<>stand followed by functional mobility. Toileting- Clothing Manipulation and Hygiene: Minimal assistance;Sit to/from stand       Functional mobility during ADLs: Moderate assistance;Cane General ADL Comments: Unfamiliar environment likely limiting pt's independence with ADL and functional mobility. Pt with L lateral lean during simulated toilet transfer this session. Assist for standing ADL to assist with balance.     Vision Baseline Vision/History: Legally blind (Visual hallucinations at baseline.) Patient Visual Report: Other (comment) (Reports vision is "different" citing overshooting with reach) Additional Comments: Pt blind at baseline. He does report that  his vision seems "different" but he does not report specific changes in acuity. Does report some hallucinations at  baseline but this has not changed since CVA.     Perception     Praxis      Pertinent Vitals/Pain Pain Assessment: No/denies pain Pain Intervention(s): Monitored during session     Hand Dominance Right   Extremity/Trunk Assessment Upper Extremity Assessment Upper Extremity Assessment: RUE deficits/detail RUE Deficits / Details: Slightly decreased strength in RUE (grossly 4+/5) as compared to L. RUE Coordination:  (Reports overshooting/undershooting but not noted on eval.)   Lower Extremity Assessment Lower Extremity Assessment: Defer to PT evaluation;Generalized weakness       Communication Communication Communication: No difficulties   Cognition Arousal/Alertness: Awake/alert Behavior During Therapy: WFL for tasks assessed/performed Overall Cognitive Status: Within Functional Limits for tasks assessed                 General Comments: For tasks assessed, cognition was [facility]. However, plan to continue to assess higher level problem solving and sequencing skills.   General Comments       Exercises       Shoulder Instructions      Home Living Family/patient expects to be discharged to:: Private residence (Independent living facility) Living Arrangements: Alone Available Help at Discharge: Family;Available PRN/intermittently (daughters) Type of Home: Apartment Home Access: Level entry     Home Layout: One level     Bathroom Shower/Tub: Tub/shower unit Shower/tub characteristics: Architectural technologist: Standard     Home Equipment: Cane - single point          Prior Functioning/Environment Level of Independence: Needs assistance  Gait / Transfers Assistance Needed: Utilizing cane ADL's / Homemaking Assistance Needed: Daughters assist with financial management, cooking, cleaning, and providing set-up for ADL.            OT Problem List: Decreased strength;Decreased activity tolerance;Impaired balance (sitting and/or standing);Decreased safety  awareness;Decreased knowledge of use of DME or AE      OT Treatment/Interventions: Self-care/ADL training;Therapeutic exercise;Neuromuscular education;Energy conservation;DME and/or AE instruction;Therapeutic activities;Visual/perceptual remediation/compensation;Patient/family education;Balance training;Cognitive remediation/compensation    OT Goals(Current goals can be found in the care plan section) Acute Rehab OT Goals Patient Stated Goal: to go home and walk better OT Goal Formulation: With patient Time For Goal Achievement: 03/21/16 Potential to Achieve Goals: Good ADL Goals Pt Will Perform Grooming: with modified independence;standing Pt Will Transfer to Toilet: with modified independence;ambulating;regular height toilet Pt Will Perform Toileting - Clothing Manipulation and hygiene: with modified independence;sit to/from stand Pt Will Perform Tub/Shower Transfer: Tub transfer;with modified independence;3 in 1;ambulating;grab bars Additional ADL Goal #1: Pt will independently identify 3 fall prevention strategies to implement into daily ADL routine in order to improve independence in home environment.  OT Frequency: Min 2X/week   Barriers to D/C:            Co-evaluation              End of Session Equipment Utilized During Treatment: Gait belt [facility]) Nurse Communication: Mobility status  Activity Tolerance: Patient tolerated treatment well Patient left: in chair;with call bell/phone within reach;with family/visitor present;with chair alarm set  OT Visit Diagnosis: Unsteadiness on feet (R26.81)                ADL either performed or assessed with clinical judgement  Time: 3267-1245 OT Time Calculation (min): 33 min Charges:  OT General Charges $OT Visit: 1 Procedure OT Evaluation $OT Eval Moderate Complexity: 1 Procedure OT Treatments $  Self Care/Home Management : 8-22 mins G-Codes:     Norman Herrlich, MS OTR/L  Pager: [place] A Garlene Apperson 03/14/2016,  5:39 PM

## 2016-03-14 NOTE — Evaluation (Signed)
Speech Language Pathology Evaluation Patient Details Name: Caleb Swails Sr. MRN: [id number] DOB: [birth year] Today's Date: 03/14/2016 Time: 8850-2774 SLP Time Calculation (min) (ACUTE ONLY): 26 min  Problem List:  Patient Active Problem List   Diagnosis Date Noted  . Acute cerebrovascular accident (CVA) ([place]) 03/13/2016  . Essential hypertension 03/13/2016  . Pruritus 01/03/2016  . UTI (urinary tract infection) 10/27/2015  . Impacted cerumen of left ear 10/03/2015  . Sore throat in the morning 09/23/2015  . Situational mixed anxiety and depressive disorder 06/17/2015  . Neuropathic pain 03/09/2015  . Insomnia 03/09/2015  . Left arm weakness 08/13/2014  . Herpes zoster 07/13/2014  . Constipation 07/13/2014  . Epididymitis 02/23/2014  . Pain in joint, ankle and foot 08/04/2013  . Dry mouth 05/05/2013  . 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 Dizziness 11/01/2011  . Obesity (BMI 30-39.9)   . Chronic kidney disease (CKD), stage IV (severe) ([place])   . Elevated PSA 08/17/2011  . Bladder neck obstruction 04/13/2011  . DM (diabetes mellitus), type 2 with ophthalmic complications ([place])   . Glaucoma associated with ocular disorder   . Carpal tunnel sundrome   . Erectile dysfunction   . GERD   . Cervical disc disorder with radiculopathy   . Hyperlipidemia   . Cholelithiases   . Hypertensive heart disease   . Coronary atherosclerosis    Past Medical History:  Past Medical History:  Diagnosis Date  . Blindness   . CAD (coronary artery disease)   . Cholelithiasis   . CTS (carpal tunnel syndrome)    Left  . DM type 2 with diabetic peripheral neuropathy ([place])   . ED (erectile dysfunction)   . Elevated PSA   . GERD (gastroesophageal reflux disease)   . Glaucoma   . HTN (hypertension)   . 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 MI (myocardial infarction) 1991  . Thyroid nodule   . Type II or unspecified type diabetes mellitus without mention of complication, not  stated as uncontrolled    Past Surgical History:  Past Surgical History:  Procedure Laterality Date  . CHOLECYSTECTOMY     HPI:  Ptis a 76 y.o.malewith PMH significant of blindness, CAD, cholelithiasis, carpal tunnel syndrome, type 2 diabetes with peripheral neuropathy, ED, elevated PSA, GERD, glaucoma, hypertension who was brought to the ED due to unsteady gait. Denies weakness or sensorial changes, slurred speech or dysphagia. MRI of brain showed linear focus of restricted diffusion in the left parietal lobe most consistent with acute infarct although somewhat unusual in shape. Small area of subarachnoid hemorrhage in the left parietal region which could be acute. Atrophy and chronic microvascular ischemic changes as above.   Assessment / Plan / Recommendation Clinical Impression  Caleb Dunn was alert and oriented (slightly disoriented to time) and had complaints of memory deficits which began roughly 3-4 months ago. He lives alone and is legally blind, but his daughters and family members assist with finances, appointments, meals, and medications. Oral motor assessment was [facility]. SLP administered the Cognistat to assess Caleb Dunn language and cognitive abilities. Deficits were noted in the following domains: mild attentional, mild-moderate language comprehension, mild language repetition, and moderate memory impairments. Choices from a list were beneficial 2/4 times for recalling the words during the memory task. Although Caleb Dunn exhibited cognitive and language deficits, he appears functional given limited activity and the level of support from family. He reported that his family is involved and provides assistance several times throughout the week. SLP educated  pt re: performance and memory strategies/aids that he should continue to utilize. ST treatment not needed at this time.    SLP Assessment  SLP Recommendation/Assessment: Patient does not need any further Speech Lanaguage  Pathology Services SLP Visit Diagnosis: Cognitive communication deficit (R41.841)    Follow Up Recommendations  None    Frequency and Duration           SLP Evaluation Cognition  Overall Cognitive Status: History of cognitive impairments - at baseline (reported "thinking has been off") Arousal/Alertness: Awake/alert Orientation Level: Oriented to person;Oriented to place;Oriented to situation;Disoriented to time Attention: Sustained Sustained Attention: Impaired Sustained Attention Impairment: Verbal basic Memory: Impaired Memory Impairment: Retrieval deficit;Decreased short term memory;Storage deficit Decreased Short Term Memory: Verbal basic Awareness: Appears intact Problem Solving: Appears intact (for basic info) Safety/Judgment: Appears intact       Comprehension  Auditory Comprehension Overall Auditory Comprehension: Impaired Yes/No Questions: Not tested Commands: Impaired Multistep Basic Commands: 0-24% accurate Conversation: Complex Interfering Components: Attention Visual Recognition/Discrimination Discrimination: Not tested Reading Comprehension Reading Status: Not tested    Expression Expression Primary Mode of Expression: Verbal Verbal Expression Overall Verbal Expression: Impaired Initiation: No impairment Level of Generative/Spontaneous Verbalization: Conversation Repetition: Impaired Level of Impairment: Sentence level Naming: Not tested (visually impaired) Pragmatics: No impairment Written Expression Written Expression: Not tested   Oral / Motor  Oral Motor/Sensory Function Overall Oral Motor/Sensory Function: Within functional limits Motor Speech Overall Motor Speech: Appears within functional limits for tasks assessed   GO                    Fransisca Kaufmann , Student-SLP 03/14/2016, 10:42 AM

## 2016-03-14 NOTE — Progress Notes (Signed)
  Echocardiogram 2D Echocardiogram with Definity has been performed.  Kenniya Westrich 03/14/2016, 2:02 PM

## 2016-03-15 ENCOUNTER — Inpatient Hospital Stay (HOSPITAL_COMMUNITY): Payer: Medicare HMO

## 2016-03-15 DIAGNOSIS — G8191 Hemiplegia, unspecified affecting right dominant side: Secondary | ICD-10-CM

## 2016-03-15 DIAGNOSIS — I639 Cerebral infarction, unspecified: Secondary | ICD-10-CM

## 2016-03-15 LAB — GLUCOSE, CAPILLARY
GLUCOSE-CAPILLARY: 61 mg/dL — AB (ref 65–99)
GLUCOSE-CAPILLARY: 160 mg/dL — AB (ref 65–99)
Glucose-Capillary: 122 mg/dL — ABNORMAL HIGH (ref 65–99)
Glucose-Capillary: 141 mg/dL — ABNORMAL HIGH (ref 65–99)
Glucose-Capillary: 76 mg/dL (ref 65–99)

## 2016-03-15 LAB — BASIC METABOLIC PANEL
Anion gap: 9 (ref 5–15)
BUN: 18 mg/dL (ref 6–20)
CALCIUM: 9.1 mg/dL (ref 8.9–10.3)
CO2: 23 mmol/L (ref 22–32)
CREATININE: 2.04 mg/dL — AB (ref 0.61–1.24)
Chloride: 107 mmol/L (ref 101–111)
GFR calc non Af Amer: 30 mL/min — ABNORMAL LOW (ref >60)
GFR, EST AFRICAN AMERICAN: 35 mL/min — AB (ref >60)
Glucose, Bld: 67 mg/dL (ref 65–99)
Potassium: 4.3 mmol/L (ref 3.5–5.1)
SODIUM: 139 mmol/L (ref 135–145)

## 2016-03-15 LAB — CBC
HCT: 39.8 % (ref 39.0–52.0)
Hemoglobin: 13 g/dL (ref 13.0–17.0)
MCH: 27 pg (ref 26.0–34.0)
MCHC: 32.7 g/dL (ref 30.0–36.0)
MCV: 82.6 fL (ref 78.0–100.0)
Platelets: 189 10*3/uL (ref 150–400)
RBC: 4.82 MIL/uL (ref 4.22–5.81)
RDW: 13.5 % (ref 11.5–15.5)
WBC: 8.3 10*3/uL (ref 4.0–10.5)

## 2016-03-15 LAB — HEMOGLOBIN A1C
HEMOGLOBIN A1C: 7.2 % — AB (ref 4.8–5.6)
MEAN PLASMA GLUCOSE: 160

## 2016-03-15 NOTE — Progress Notes (Signed)
[facility] PROGRESS NOTE  Caleb Racca Sr. VQQ:[id number] DOB: 1940-12-24 DOA: 03/13/2016  PCP: Walker Kehr, MD  Brief History/Interval Summary: 76 year old African-American male with a past medical history of blindness, coronary artery disease, cholelithiasis, carpal tunnel syndrome, type 2 diabetes with peripheral neuropathy, hypertension, who was brought into the emergency department to unsteady gait. Patient found to have acute stroke. He was hospitalized.  Reason for Visit: Acute stroke  Consultants: Neurology. Rehabilitation medicine  Procedures:  Transthoracic echocardiogram Study Conclusions  - Left ventricle: The cavity size was normal. Wall thickness was   increased in a pattern of mild LVH. The estimated ejection   fraction was 55%. Basal to mid inferior hypokinesis. Doppler   parameters are consistent with abnormal left ventricular   relaxation (grade 1 diastolic dysfunction). - Aortic valve: There was no stenosis. - Mitral valve: There was no significant regurgitation. - Right ventricle: Poorly visualized. Probably normal size and   systolic function. - Pulmonary arteries: No complete TR doppler jet so unable to   estimate PA systolic pressure. - Inferior vena cava: The vessel was normal in size. The   respirophasic diameter changes were in the normal range (>= 50%),   consistent with normal central venous pressure.  Impressions:  - Technically difficult study with poor acoustic windows. Normal LV   size with mild LV hypertrophy. EF 55%. Basal to mid inferior   hypokinesis. Poorly visualized RV, but probably normal size and   systolic function. No significant valvular abnormalities.  Carotid Duplex (Doppler).  Preliminary findings: Bilateral: No significant (1-39%) ICA stenosis. Antegrade vertebral flow.   Antibiotics: None  Subjective/Interval History: Patient feels well. Asking about going home. Denies any headaches. Denies any nausea or  vomiting.   ROS: Denies any chest pain or shortness of breath.  Objective:  Vital Signs  Vitals:   03/14/16 1736 03/14/16 2249 03/15/16 0105 03/15/16 0600  BP: (!) 149/72 (!) 148/72 (!) 159/76 (!) 187/79  Pulse: 72 75 69 71  Resp: 18 20 18 18   Temp: 97.5 F (36.4 C) 97.9 F (36.6 C) 97.8 F (36.6 C) 97.6 F (36.4 C)  TempSrc: Oral Oral Oral Oral  SpO2: 99% 99% 100% 100%  Weight:      Height:        Intake/Output Summary (Last 24 hours) at 03/15/16 4332 Last data filed at 03/15/16 0700  Gross per 24 hour  Intake                4 ml  Output              200 ml  Net             -196 ml   Filed Weights   03/13/16 1249  Weight: 99.8 kg (220 lb)    General appearance: alert, cooperative, appears stated age and no distress Resp: clear to auscultation bilaterally Cardio: regular rate and rhythm, S1, S2 normal, no murmur, click, rub or gallop GI: soft, non-tender; bowel sounds normal; no masses,  no organomegaly Neurologic: Awake and alert. Oriented 3. Strength equal bilateral upper and lower extremities. Gait not assessed.  Lab Results:  Data Reviewed: I have personally reviewed following labs and imaging studies  CBC:  Recent Labs Lab 03/13/16 1301 03/15/16 0204  WBC 6.2 8.3  NEUTROABS 3.1  --   HGB 13.8 13.0  HCT 41.1 39.8  MCV 82.5 82.6  PLT 195 951    Basic Metabolic Panel:  Recent Labs Lab 03/13/16 1301 03/14/16 0321 03/15/16  0204  NA 136 136 139  K 4.2 3.8 4.3  CL 106 106 107  CO2 24 24 23   GLUCOSE 150* 160* 67  BUN 20 17 18   CREATININE 2.10* 2.00* 2.04*  CALCIUM 8.7* 8.7* 9.1    GFR: Estimated Creatinine Clearance: 34.1 mL/min (by C-G formula based on SCr of 2.04 mg/dL (H)).  Liver Function Tests:  Recent Labs Lab 03/13/16 1301 03/14/16 0321  AST 22 18  ALT 28 23  ALKPHOS 73 68  BILITOT 0.4 0.4  PROT 7.6 7.0  ALBUMIN 3.5 3.1*     Coagulation Profile:  Recent Labs Lab 03/13/16 1301  INR 1.06    CBG:  Recent  Labs Lab 03/14/16 1204 03/14/16 1630 03/14/16 2043 03/15/16 0700 03/15/16 0800  GLUCAP 116* 132* 126* 61* 122*    Lipid Profile:  Recent Labs  03/14/16 0321  CHOL 132  HDL 41  LDLCALC 76  TRIG 75  CHOLHDL 3.2     Radiology Studies: Dg Chest 2 View  Result Date: 03/13/2016 CLINICAL DATA:  Unsteady gait and fall today. History of hypertension, diabetes. EXAM: CHEST  2 VIEW COMPARISON:  PA and lateral chest x-ray of October 27, 2015 FINDINGS: Today's study is obtained in a lordotic projection. The lungs are well-expanded and clear. The heart is top-normal in size. The pulmonary vascularity is normal. The patient has undergone previous CABG. The observed bony thorax is unremarkable. IMPRESSION: There is no active cardiopulmonary disease.  Previous CABG. Electronically Signed   By: David  Martinique M.D.   On: 03/13/2016 17:24   Ct Head Wo Contrast  Result Date: 03/13/2016 CLINICAL DATA:  Pt awoke with unsteady gait, stumbling; pt has also developed a Bilateral temporal h/a Pt denies any injury, or prior stroke EXAM: CT HEAD WITHOUT CONTRAST TECHNIQUE: Contiguous axial images were obtained from the base of the skull through the vertex without intravenous contrast. COMPARISON:  None. FINDINGS: Brain: Small area of hypoattenuation noted in the left thalamus, likely a chronic lacune or infarct. No definite acute infarction, hemorrhage, hydrocephalus, extra-axial collection or mass lesion/mass effect. The ventricles and sulci are enlarged reflecting mild to moderate diffuse atrophy. Mild patchy white matter hypoattenuation is noted consistent with chronic microvascular ischemic change. Vascular: No hyperdense vessel or unexpected calcification. Skull: Normal. Negative for fracture or focal lesion. Sinuses/Orbits: No acute orbital abnormality. Visualized sinuses and mastoid air cells are clear. Other: None. IMPRESSION: 1. No acute intracranial abnormalities. 2. Atrophy and mild chronic microvascular  ischemic change. Left thalamic lacune infarct. Electronically Signed   By: Lajean Manes M.D.   On: 03/13/2016 14:58   Mr Brain Wo Contrast  Result Date: 03/13/2016 CLINICAL DATA:  Unsteady gait.  Fall today. EXAM: MRI HEAD WITHOUT CONTRAST TECHNIQUE: Multiplanar, multiecho pulse sequences of the brain and surrounding structures were obtained without intravenous contrast. COMPARISON:  CT head 03/13/2016 FINDINGS: Brain: Linear focus of restricted diffusion in the left parietal lobe most consistent with acute infarct involving gray and white matter. This is relatively small measuring approximately 5 x 20 mm. No other area of acute infarct. Generalized atrophy of a moderate degree. Chronic microvascular ischemic change in the white matter and pons. Chronic lacunar infarct in the left thalamus. Small chronic infarct left cerebellum. Negative for mass. Chronic microhemorrhage in the right posterior temporal lobe, and left thalamus. Small area of susceptibility in the left parietal subarachnoid space which may represent a small area of acute subarachnoid hemorrhage. This is not definitely seen on the CT scan. No subdural fluid  collection. Vascular: Normal arterial flow void. Skull and upper cervical spine: Negative Sinuses/Orbits: Negative Other: None IMPRESSION: Linear focus of restricted diffusion in the left parietal lobe most consistent with acute infarct although somewhat unusual in shape. Small area of subarachnoid hemorrhage in the left parietal region which could be acute. This is not seen on the CT. Atrophy and chronic microvascular ischemic changes as above. Electronically Signed   By: Franchot Gallo M.D.   On: 03/13/2016 19:58     Medications:  Scheduled: . aspirin EC  325 mg Oral Daily  . finasteride  5 mg Oral Daily  . FLUoxetine  20 mg Oral Daily  . insulin aspart  0-9 Units Subcutaneous TID WC  . linaclotide  290 mcg Oral Daily  . linagliptin  5 mg Oral Daily  . losartan  50 mg Oral Daily    . metoprolol succinate  75 mg Oral Daily  . pantoprazole  40 mg Oral Daily  . polyethylene glycol  17 g Oral Daily  . pravastatin  20 mg Oral q1800  . sodium chloride flush  3 mL Intravenous Q12H  . triamcinolone ointment  1 application Topical BID   Continuous:  VUD:THYHOOILN, senna-docusate  Assessment/Plan:  Principal Problem:   Acute cerebrovascular accident (CVA) ([place]) Active Problems:   Hyperlipidemia   Coronary atherosclerosis   GERD   DM (diabetes mellitus), type 2 with ophthalmic complications ([facility])   Chronic kidney disease (CKD), stage IV (severe) ([facility])   Constipation   Situational mixed anxiety and depressive disorder   Essential hypertension    Acute cerebrovascular accident (CVA) with parietal infarct as well as subarachnoid hemorrhage Patient seen by neurology. Workup has been completed. Echocardiogram report as above. Carotid Doppler without any significant stenosis. LDL is 76. Patient is on a statin. Seen by physical therapy. Inpatient rehabilitation was recommended. Rehabilitation medicine has been consulted. Started on aspirin by neurology. Not a candidate for embolic workup due to other comorbidities as they would preclude anticoagulation.   Hyperlipidemia Pravastatin 20 mg by mouth daily.  History of Coronary atherosclerosis Stable. Continue aspirin, beta blocker and statin.  GERD Protonix 40 mg by mouth daily.  DM (diabetes mellitus), type 2 with ophthalmic complications Continue to monitor CBGs. Sliding scale insulin coverage. Home medications are being continued. HbA1c is 7.2.  Chronic kidney disease (CKD), stage IV (severe) Stable. Monitor urine output.  Constipation Continue Linzess, MiraLAX, Senokot.  Situational mixed anxiety and depressive disorder Continue fluoxetine 20 mg by mouth daily.  Essential hypertension Allowing permissive hypertension to some extent. Blood pressure goal to be between 140 and 170. Dose of metoprolol  was increased. Continue losartan. Blood pressure reasonably controlled at this time.   DVT Prophylaxis: SCDs    Code Status: Full code  Family Communication: Discussed with patient  Disposition Plan: Await insurance approval for patient to go to inpatient rehabilitation.    LOS: 2 days   [facility] Pager [phone] 03/15/2016, 9:39 AM  If 7PM-7AM, please contact night-coverage at [email or web address], password [facility]

## 2016-03-15 NOTE — Progress Notes (Signed)
Occupational Therapy Treatment Patient Details Name: Uchenna Rappaport Sr. MRN: [id number] DOB: 04/23/1940 Today's Date: 03/15/2016    History of present illness Pt is a 76 y.o. male who presented to the ED due to unsteady gait. Pt has a past medical history of blindness, CAD< cholelithiasis, carpal tunnel syndrome, diabetes mellitus with diabetic peripheral neuropathy, ED, elevated PSA, GERD, glaucoma, and hypertension. MRI revealed linear abnormal finding consistent with acute parietal infarction.   OT comments  Pt progressing toward OT goals. He remains unsteady during functional mobility requiring min assist for toilet transfers. Additionally, pt presenting with decreased short-term memory and difficulty sequencing and following commands during ADL tasks. Due to decreased safety with ADL tasks and current decline from PLOF, feel pt would benefit from CIR placement for continued rehabilitation to maximize return to PLOF. Updated D/C recommendations accordingly.    Follow Up Recommendations  CIR    Equipment Recommendations  3 in 1 bedside commode    Recommendations for Other Services Rehab consult    Precautions / Restrictions Precautions Precautions: Fall Precaution Comments: pt is blind, reliance on assistive device Restrictions Weight Bearing Restrictions: No       Mobility Bed Mobility Overal bed mobility: Needs Assistance Bed Mobility: Sit to Supine       Sit to supine: (P) Supervision   General bed mobility comments: OOB in chair on OT arrival.  Transfers Overall transfer level: Needs assistance Equipment used: Rolling walker (2 wheeled) Transfers: Sit to/from Stand Sit to Stand: Min guard         General transfer comment: (P) min guard from recliner chair    Balance Overall balance assessment: Needs assistance Sitting-balance support: No upper extremity supported;Feet supported Sitting balance-Leahy Scale: Good     Standing balance support: Single  extremity supported;Bilateral upper extremity supported;During functional activity Standing balance-Leahy Scale: Fair Standing balance comment: Able to statically stand without UE support but requires B UE support for functional mobility with min assist from therapist.                   ADL       Grooming: Set up;Sitting               Lower Body Dressing: Minimal assistance;Sit to/from stand   Toilet Transfer: Minimal assistance;BSC;Ambulation;RW Toilet Transfer Details (indicate cue type and reason): Min assist to maintain balance with RW.         Functional mobility during ADLs: Minimal assistance;Rolling walker General ADL Comments: Limited by visual deficits and unfamiliar environment. Improved R UE coordination noted during ADL tasks with       Vision                 Additional Comments: Pt blind at baseline.   Perception     Praxis      Cognition   Behavior During Therapy: WFL for tasks assessed/performed Overall Cognitive Status: Impaired/Different from baseline Area of Impairment: Memory;Following commands;Problem solving     Memory: Decreased short-term memory  Following Commands: Follows multi-step commands inconsistently     Problem Solving: Requires verbal cues;Difficulty sequencing General Comments: Pt with decreased short-term memory and requires verbal cues for sequencing multi-step tasks.      Exercises     Shoulder Instructions       General Comments      Pertinent Vitals/ Pain       Pain Assessment: No/denies pain  Home Living  Prior Functioning/Environment              Frequency  Min 2X/week        Progress Toward Goals  OT Goals(current goals can now be found in the care plan section)  Progress towards OT goals: Progressing toward goals  Acute Rehab OT Goals Patient Stated Goal: get stronger OT Goal Formulation: With patient Time For Goal  Achievement: 03/21/16 Potential to Achieve Goals: Good ADL Goals Pt Will Perform Grooming: with modified independence;standing Pt Will Transfer to Toilet: with modified independence;ambulating;regular height toilet Pt Will Perform Toileting - Clothing Manipulation and hygiene: with modified independence;sit to/from stand Pt Will Perform Tub/Shower Transfer: Tub transfer;with modified independence;3 in 1;ambulating;grab bars Additional ADL Goal #1: Pt will independently identify 3 fall prevention strategies to implement into daily ADL routine in order to improve independence in home environment.  Plan Discharge plan needs to be updated    Co-evaluation                 End of Session Equipment Utilized During Treatment: Gait belt;Rolling walker  OT Visit Diagnosis: Unsteadiness on feet (R26.81)   Activity Tolerance Patient tolerated treatment well   Patient Left in chair;with call bell/phone within reach;with chair alarm set   Nurse Communication Mobility status        Time: 7356-7014 OT Time Calculation (min): 16 min  Charges: OT General Charges $OT Visit: 1 Procedure OT Treatments $Self Care/Home Management : 8-22 mins  Norman Herrlich, MS OTR/L  Pager: [place] A Earlene Bjelland 03/15/2016, 6:00 PM

## 2016-03-15 NOTE — Consult Note (Signed)
Physical Medicine and Rehabilitation Consult Reason for Consult: Left parietal lobe infarct Referring Physician: [facility]   HPI: Caleb Bailon Sr. is a 76 y.o. right handed male with history significant legally blind, CAD maintained on aspirin, type 2 diabetes mellitus and peripheral neuropathy. Per chart review patient lives alone uses a cane in a independent living facility [facility]. His daughters and family members assist with finances appointments and meals. He presented 03/13/2016 with dizziness, altered mental status and unsteady gait. Cranial CT scan negative. MRI showed restricted diffusion in the left parietal lobe consistent with acute infarct. Small area of subarachnoid hemorrhage in the left parietal region felt to be possibly acute. Patient did not receive TPA. Echocardiogram with ejection fraction of 15% grade 1 diastolic dysfunction. Carotid Dopplers with no ICA stenosis. Neurology consulted presently on aspirin for CVA prophylaxis. Tolerating a regular consistency diet. Physical therapy evaluation completed with recommendations of physical medicine rehabilitation consult.   Review of Systems  Constitutional: Negative for chills and fever.  HENT: Negative for hearing loss and tinnitus.   Eyes:       Legally blind  Respiratory: Negative for cough and shortness of breath.   Cardiovascular: Positive for leg swelling. Negative for chest pain and palpitations.  Gastrointestinal: Positive for constipation. Negative for nausea and vomiting.       GRED  Genitourinary: Positive for urgency. Negative for dysuria and hematuria.  Musculoskeletal: Positive for myalgias.       Occassional falls  Skin: Negative for rash.  Neurological: Positive for dizziness and weakness.  Psychiatric/Behavioral: Positive for depression.  All other systems reviewed and are negative.  Past Medical History:  Diagnosis Date  . Blindness   . CAD (coronary artery disease)   . Cholelithiasis    . CTS (carpal tunnel syndrome)    Left  . DM type 2 with diabetic peripheral neuropathy ([place])   . ED (erectile dysfunction)   . Elevated PSA   . GERD (gastroesophageal reflux disease)   . Glaucoma   . HTN (hypertension)   . 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 MI (myocardial infarction) 1991  . Thyroid nodule   . Type II or unspecified type diabetes mellitus without mention of complication, not stated as uncontrolled    Past Surgical History:  Procedure Laterality Date  . CHOLECYSTECTOMY     Family History  Problem Relation Age of Onset  . Hypertension Mother   . Kidney disease Father   . Hypertension Father   . Coronary artery disease      1st degree Male relative <60  . Diabetes      1st degree relative   Social History:  reports that he has quit smoking. He has never used smokeless tobacco. He reports that he does not drink alcohol or use drugs. Allergies:  Allergies  Allergen Reactions  . Benazepril Hcl Cough  . Hydroxyzine Other (See Comments)    Visual hallucinations  . Verapamil Other (See Comments)    Hallucinations    Medications Prior to Admission  Medication Sig Dispense Refill  . aspirin 81 MG tablet Take 1 tablet (81 mg total) by mouth daily. 100 tablet 3  . cholecalciferol (VITAMIN D) 1000 UNITS tablet Take 1 tablet (1,000 Units total) by mouth daily. 100 tablet 3  . Emollient (EUCERIN) lotion Apply topically as needed for dry skin. Use bid 480 mL 3  . FLUoxetine (PROZAC) 20 MG tablet Take 1 tablet (20 mg total) by mouth daily.  30 tablet 5  . hydrOXYzine (ATARAX/VISTARIL) 25 MG tablet Take 25 mg by mouth 3 (three) times daily as needed.    Marland Kitchen JANUVIA 50 MG tablet TAKE ONE TABLET BY MOUTH ONCE DAILY 30 tablet 3  . losartan (COZAAR) 50 MG tablet Take 1 tablet (50 mg total) by mouth daily. 90 tablet 2  . lovastatin (MEVACOR) 20 MG tablet Take 1 tablet (20 mg total) by mouth daily. For Cholesterol 90 tablet 3  . metoprolol succinate  (TOPROL-XL) 50 MG 24 hr tablet Take 1 tablet (50 mg total) by mouth daily. For Blood Pressure 90 tablet 2  . polyethylene glycol (MIRALAX / GLYCOLAX) packet Take 17 g by mouth daily. (Patient taking differently: Take 17 g by mouth daily as needed for moderate constipation. ) 30 each 5  . Blood Glucose Monitoring Suppl (PRODIGY BLOOD GLUCOSE MONITOR) DEVI As dirrected (Patient not taking: Reported on 03/14/2016) 1 each 0  . finasteride (PROSCAR) 5 MG tablet Take 1 tablet (5 mg total) by mouth daily. For Prostate (Patient not taking: Reported on 03/14/2016) 90 tablet 3  . glimepiride (AMARYL) 2 MG tablet TAKE 1 TABLET TWICE DAILY FOR DIABETES (Patient not taking: Reported on 03/14/2016) 180 tablet 2  . glucose blood test strip 1 each by Other route 2 (two) times daily. Use to check blood sugars twice a day Dx E11.8 (Patient not taking: Reported on 03/14/2016) 300 each 3  . Incontinence Supply Disposable (DEPEND FITTED BRIEFS SM/MED) MISC Use tid prn (Patient not taking: Reported on 03/14/2016) 100 each 6  . Linaclotide (LINZESS) 290 MCG CAPS capsule Take 1 capsule (290 mcg total) by mouth daily. (Patient not taking: Reported on 03/14/2016) 30 capsule 11  . PRODIGY LANCETS 28G MISC 28 g by Other route 2 (two) times daily. Use to help check blood sugars twice a day Dx E11.8 (Patient not taking: Reported on 03/14/2016) 300 each 3  . senna-docusate (SENOKOT-S) 8.6-50 MG tablet Take 1 tablet by mouth at bedtime as needed for mild constipation. (Patient not taking: Reported on 03/14/2016) 30 tablet 1  . triamcinolone ointment (KENALOG) 0.5 % Apply 1 application topically 2 (two) times daily. On palms (Patient not taking: Reported on 03/14/2016) 30 g 1    Home: Home Living Family/patient expects to be discharged to:: Private residence ([place] living facility) Living Arrangements: Alone Available Help at Discharge: Family, Available PRN/intermittently (daughters) Type of Home: Apartment Home Access: Level  entry Home Layout: One level Bathroom Shower/Tub: Chiropodist: Standard Home Equipment: Radio producer - single point  Lives With: Alone  Functional History: Prior Function Level of Independence: Needs assistance Gait / Transfers Assistance Needed: Utilizing cane ADL's / Homemaking Assistance Needed: Daughters assist with financial management, cooking, cleaning, and providing set-up for ADL. Functional Status:  Mobility: Bed Mobility Overal bed mobility: Needs Assistance Bed Mobility: Supine to Sit Supine to sit: Min assist General bed mobility comments: OOB Transfers Overall transfer level: Needs assistance Equipment used: Straight cane Transfers: Sit to/from Stand Sit to Stand: Min guard General transfer comment: Min guard assist for initial sit<>stand.  Then steady assist once he released the arm. Ambulation/Gait Ambulation/Gait assistance: Min assist, Mod assist Ambulation Distance (Feet): 90 Feet Assistive device: Straight cane, 1 person hand held assist Gait Pattern/deviations: Step-through pattern, Shuffle General Gait Details: consistent turning or bearing left even if pt had hold of a surface such as foot board or rail in the hall.  pt unable to easily distinguish tactile input to turn or stay straight and  did not follow directional cues well. Gait velocity: slower Gait velocity interpretation: Below normal speed for age/gender    ADL: ADL Overall ADL's : Needs assistance/impaired Eating/Feeding: Set up, Sitting Grooming: Set up, Sitting Upper Body Bathing: Set up, Sitting Lower Body Bathing: Minimal assistance, Sit to/from stand Upper Body Dressing : Set up, Sitting Lower Body Dressing: Minimal assistance, Sit to/from stand Toilet Transfer: Moderate assistance, BSC, Ambulation Toilet Transfer Details (indicate cue type and reason): Simulated with sit<>stand followed by functional mobility. Toileting- Clothing Manipulation and Hygiene: Minimal  assistance, Sit to/from stand Functional mobility during ADLs: Moderate assistance, Cane General ADL Comments: Unfamiliar environment likely limiting pt's independence with ADL and functional mobility. Pt with L lateral lean during simulated toilet transfer this session. Assist for standing ADL to assist with balance.  Cognition: Cognition Overall Cognitive Status: Within Functional Limits for tasks assessed Arousal/Alertness: Awake/alert Orientation Level: Oriented X4 Attention: Sustained Sustained Attention: Impaired Sustained Attention Impairment: Verbal basic Memory: Impaired Memory Impairment: Retrieval deficit, Decreased short term memory, Storage deficit Decreased Short Term Memory: Verbal basic Awareness: Appears intact Problem Solving: Appears intact (for basic info) Safety/Judgment: Appears intact Cognition Arousal/Alertness: Awake/alert Behavior During Therapy: WFL for tasks assessed/performed Overall Cognitive Status: Within Functional Limits for tasks assessed General Comments: For tasks assessed, cognition was [facility]. However, plan to continue to assess higher level problem solving and sequencing skills.  Blood pressure (!) 187/79, pulse 71, temperature 97.6 F (36.4 C), temperature source Oral, resp. rate 18, height 5\' 6"  (1.676 m), weight 99.8 kg (220 lb), SpO2 100 %. Physical Exam  Constitutional: He is oriented to person, place, and time.  HENT:  Head: Normocephalic.  Eyes:  Pupils very sluggish to bright light.He can see some shadows  Neck: Normal range of motion. Neck supple. No thyromegaly present.  Cardiovascular: Normal rate and regular rhythm.   Respiratory: Effort normal and breath sounds normal. No respiratory distress.  GI: Soft. Bowel sounds are normal. He exhibits no distension.  Neurological: He is alert and oriented to person, place, and time. He exhibits normal muscle tone.  Blind--sees light /dark. He extended his hand to me as I introduced  myself.Follows basic commands.Fair awareness. Right central 7. RUE 4 to 4+/5. LUE 5/5. RLE 4/5. LLE 4+/5. Decreased LT RUE and RLE 1+/2 as well as decreased [place] on right. No PD.   Skin: Skin is warm and dry.  Psychiatric: He has a normal mood and affect. His behavior is normal.    Results for orders placed or performed during the hospital encounter of 03/13/16 (from the past 24 hour(s))  Glucose, capillary     Status: Abnormal   Collection Time: 03/14/16 12:04 PM  Result Value Ref Range   Glucose-Capillary 116 (H) 65 - 99 mg/dL  Glucose, capillary     Status: Abnormal   Collection Time: 03/14/16  4:30 PM  Result Value Ref Range   Glucose-Capillary 132 (H) 65 - 99 mg/dL  Glucose, capillary     Status: Abnormal   Collection Time: 03/14/16  8:43 PM  Result Value Ref Range   Glucose-Capillary 126 (H) 65 - 99 mg/dL   Comment 1 Notify RN    Comment 2 Document in Chart   CBC     Status: None   Collection Time: 03/15/16  2:04 AM  Result Value Ref Range   WBC 8.3 4.0 - 10.5 K/uL   RBC 4.82 4.22 - 5.81 MIL/uL   Hemoglobin 13.0 13.0 - 17.0 g/dL   HCT 39.8 39.0 - 52.0 %  MCV 82.6 78.0 - 100.0 fL   MCH 27.0 26.0 - 34.0 pg   MCHC 32.7 30.0 - 36.0 g/dL   RDW 13.5 11.5 - 15.5 %   Platelets 189 150 - 400 K/uL  Basic metabolic panel     Status: Abnormal   Collection Time: 03/15/16  2:04 AM  Result Value Ref Range   Sodium 139 135 - 145 mmol/L   Potassium 4.3 3.5 - 5.1 mmol/L   Chloride 107 101 - 111 mmol/L   CO2 23 22 - 32 mmol/L   Glucose, Bld 67 65 - 99 mg/dL   BUN 18 6 - 20 mg/dL   Creatinine, Ser 2.04 (H) 0.61 - 1.24 mg/dL   Calcium 9.1 8.9 - 10.3 mg/dL   GFR calc non Af Amer 30 (L) >60 mL/min   GFR calc Af Amer 35 (L) >60 mL/min   Anion gap 9 5 - 15  Glucose, capillary     Status: Abnormal   Collection Time: 03/15/16  7:00 AM  Result Value Ref Range   Glucose-Capillary 61 (L) 65 - 99 mg/dL   Comment 1 Notify RN    Comment 2 Document in Chart   Glucose, capillary     Status:  Abnormal   Collection Time: 03/15/16  8:00 AM  Result Value Ref Range   Glucose-Capillary 122 (H) 65 - 99 mg/dL   Comment 1 Notify RN    Comment 2 Document in Chart    Dg Chest 2 View  Result Date: 03/13/2016 CLINICAL DATA:  Unsteady gait and fall today. History of hypertension, diabetes. EXAM: CHEST  2 VIEW COMPARISON:  PA and lateral chest x-ray of October 27, 2015 FINDINGS: Today's study is obtained in a lordotic projection. The lungs are well-expanded and clear. The heart is top-normal in size. The pulmonary vascularity is normal. The patient has undergone previous CABG. The observed bony thorax is unremarkable. IMPRESSION: There is no active cardiopulmonary disease.  Previous CABG. Electronically Signed   By: David  Martinique M.D.   On: 03/13/2016 17:24   Ct Head Wo Contrast  Result Date: 03/13/2016 CLINICAL DATA:  Pt awoke with unsteady gait, stumbling; pt has also developed a Bilateral temporal h/a Pt denies any injury, or prior stroke EXAM: CT HEAD WITHOUT CONTRAST TECHNIQUE: Contiguous axial images were obtained from the base of the skull through the vertex without intravenous contrast. COMPARISON:  None. FINDINGS: Brain: Small area of hypoattenuation noted in the left thalamus, likely a chronic lacune or infarct. No definite acute infarction, hemorrhage, hydrocephalus, extra-axial collection or mass lesion/mass effect. The ventricles and sulci are enlarged reflecting mild to moderate diffuse atrophy. Mild patchy white matter hypoattenuation is noted consistent with chronic microvascular ischemic change. Vascular: No hyperdense vessel or unexpected calcification. Skull: Normal. Negative for fracture or focal lesion. Sinuses/Orbits: No acute orbital abnormality. Visualized sinuses and mastoid air cells are clear. Other: None. IMPRESSION: 1. No acute intracranial abnormalities. 2. Atrophy and mild chronic microvascular ischemic change. Left thalamic lacune infarct. Electronically Signed   By: Lajean Manes M.D.   On: 03/13/2016 14:58   Mr Brain Wo Contrast  Result Date: 03/13/2016 CLINICAL DATA:  Unsteady gait.  Fall today. EXAM: MRI HEAD WITHOUT CONTRAST TECHNIQUE: Multiplanar, multiecho pulse sequences of the brain and surrounding structures were obtained without intravenous contrast. COMPARISON:  CT head 03/13/2016 FINDINGS: Brain: Linear focus of restricted diffusion in the left parietal lobe most consistent with acute infarct involving gray and white matter. This is relatively small measuring approximately 5  x 20 mm. No other area of acute infarct. Generalized atrophy of a moderate degree. Chronic microvascular ischemic change in the white matter and pons. Chronic lacunar infarct in the left thalamus. Small chronic infarct left cerebellum. Negative for mass. Chronic microhemorrhage in the right posterior temporal lobe, and left thalamus. Small area of susceptibility in the left parietal subarachnoid space which may represent a small area of acute subarachnoid hemorrhage. This is not definitely seen on the CT scan. No subdural fluid collection. Vascular: Normal arterial flow void. Skull and upper cervical spine: Negative Sinuses/Orbits: Negative Other: None IMPRESSION: Linear focus of restricted diffusion in the left parietal lobe most consistent with acute infarct although somewhat unusual in shape. Small area of subarachnoid hemorrhage in the left parietal region which could be acute. This is not seen on the CT. Atrophy and chronic microvascular ischemic changes as above. Electronically Signed   By: Franchot Gallo M.D.   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 Pt legally blind 1. Does the need for close, 24 hr/day medical supervision in concert with the patient's rehab needs make it unreasonable for this patient to be served in a less intensive setting? Yes 2. Co-Morbidities requiring supervision/potential  complications: CKD, HTN, DM 3. Due to bladder management, bowel management, safety, skin/wound care, disease management, medication administration, pain management and patient education, does the patient require 24 hr/day rehab nursing? Yes 4. Does the patient require coordinated care of a physician, rehab nurse, PT (1-2 hrs/day, 5 days/week), OT (1-2 hrs/day, 5 days/week) and SLP (1-2 hrs/day, 5 days/week) to address physical and functional deficits in the context of the above medical diagnosis(es)? Yes Addressing deficits in the following areas: balance, endurance, locomotion, strength, transferring, bowel/bladder control, bathing, dressing, feeding, grooming, toileting, cognition, speech and psychosocial support 5. Can the patient actively participate in an intensive therapy program of at least 3 hrs of therapy per day at least 5 days per week? Yes 6. The potential for patient to make measurable gains while on inpatient rehab is excellent 7. Anticipated functional outcomes upon discharge from inpatient rehab are modified independent and supervision  with PT, modified independent and supervision with OT, modified independent and supervision with SLP. 8. Estimated rehab length of stay to reach the above functional goals is: 9-13 days 9. Does the patient have adequate social supports and living environment to accommodate these discharge functional goals? Yes 10. Anticipated D/C setting: Home 11. Anticipated post D/C treatments: HH therapy and Outpatient therapy 12. Overall Rehab/Functional Prognosis: excellent  RECOMMENDATIONS: This patient's condition is appropriate for continued rehabilitative care in the following setting: CIR Patient has agreed to participate in recommended program. Yes Note that insurance prior authorization may be required for reimbursement for recommended care.  Comment: Rehab Admissions Coordinator to follow up.  Thanks,  [name], MD,  Mellody Drown    Cathlyn Parsons., PA-C 03/15/2016

## 2016-03-15 NOTE — Progress Notes (Signed)
Physical Therapy Treatment Patient Details Name: Caleb Ruppert Sr. MRN: [id number] DOB: 1940/11/04 Today's Date: 03/15/2016    History of Present Illness Pt is a 76 y.o. male who presented to the ED due to unsteady gait. Pt has a past medical history of blindness, CAD< cholelithiasis, carpal tunnel syndrome, diabetes mellitus with diabetic peripheral neuropathy, ED, elevated PSA, GERD, glaucoma, and hypertension. MRI revealed linear abnormal finding consistent with acute parietal infarction.    PT Comments    Pt progressing with mobility. He has left drift while walking in hallway, heavily reliant on external compensatory strategies secondary to vision loss.  May have ongoing spatial difficulties as he occasionally kicked cane during gait.  Will continue to follow patient while on this venue of care to progress mobility and to assist with discharge planning.    Follow Up Recommendations  CIR     Equipment Recommendations  None recommended by PT    Recommendations for Other Services Rehab consult     Precautions / Restrictions Precautions Precautions: Fall Precaution Comments: pt is blind, reliance on assistive device Restrictions Weight Bearing Restrictions: No    Mobility  Bed Mobility Overal bed mobility: Needs Assistance Bed Mobility: Sit to Supine       Sit to supine: Supervision   General bed mobility comments: OOB in chair on OT arrival.  Transfers Overall transfer level: Needs assistance Equipment used: Rolling walker (2 wheeled) Transfers: Sit to/from Stand Sit to Stand: Min guard         General transfer comment: min guard from recliner chair  Ambulation/Gait Ambulation/Gait assistance: Min assist Ambulation Distance (Feet): 150 Feet Assistive device: Straight cane;1 person hand held assist Gait Pattern/deviations: Step-through pattern;Wide base of support     General Gait Details: mild incoordination with placement of cane Rt hand, left HHA,  mod cues for path finding in unfamiliar environment   Stairs            Wheelchair Mobility    Modified Rankin (Stroke Patients Only) Modified Rankin (Stroke Patients Only) Pre-Morbid Rankin Score: Moderate disability Modified Rankin: Moderately severe disability     Balance Overall balance assessment: Needs assistance Sitting-balance support: No upper extremity supported;Feet supported Sitting balance-Leahy Scale: Good     Standing balance support: Single extremity supported;Bilateral upper extremity supported;During functional activity Standing balance-Leahy Scale: Fair Standing balance comment: Able to statically stand without UE support but requires B UE support for functional mobility with min assist from therapist.                    Cognition Arousal/Alertness: Awake/alert Behavior During Therapy: WFL for tasks assessed/performed Overall Cognitive Status: Impaired/Different from baseline Area of Impairment: Memory;Following commands;Problem solving     Memory: Decreased short-term memory Following Commands: Follows multi-step commands inconsistently     Problem Solving: Requires verbal cues;Difficulty sequencing General Comments: Pt with decreased short-term memory and requires verbal cues for sequencing multi-step tasks.    Exercises      General Comments General comments (skin integrity, edema, etc.): post exercise HR 100 bpm. Pt's 2 daughters in room upon arrival, they did not observe mobility      Pertinent Vitals/Pain Pain Assessment: No/denies pain    Home Living                      Prior Function            PT Goals (current goals can now be found in the care plan section) Acute Rehab  PT Goals Patient Stated Goal: get stronger PT Goal Formulation: With patient Time For Goal Achievement: 03/28/16 Potential to Achieve Goals: Good Progress towards PT goals: Progressing toward goals    Frequency    Min 3X/week       PT Plan Current plan remains appropriate    Co-evaluation             End of Session Equipment Utilized During Treatment: Gait belt Activity Tolerance: Patient tolerated treatment well;Patient limited by fatigue Patient left: in bed;with call bell/phone within reach;with bed alarm set Nurse Communication: Mobility status PT Visit Diagnosis: Unsteadiness on feet (R26.81);Other abnormalities of gait and mobility (R26.89)     Time: 1696-7893 PT Time Calculation (min) (ACUTE ONLY): 41 min  Charges:  $Gait Training: 23-37 mins $Therapeutic Activity: 8-22 mins                    G CodesSuszanne Finch, PT [phone]  Rinnah Peppel 03/15/2016, 6:01 PM

## 2016-03-15 NOTE — Progress Notes (Signed)
CSW spoke with pt concerning plan for time of DC.  CSW explained need for alternative plan in case CIR is not an option at time of DC.  CSW explained SNF as possible option vs home health.  At this time patient is preferring to go home if CIR is not an option- states that he can likely stay with one of his children while he recovers.  Pt states that he will discuss further with his children tonight but at this time is not interested in SNF as an alternate option  CSW updated RNCM  Jorge Ny, [place] Social Worker [phone]

## 2016-03-15 NOTE — Progress Notes (Signed)
Rehab admissions - I met with patient.  He says he would like rehab in the hospital.  I will open the case and request acute inpatient rehab admission.  We will likely not hear back from [facility] today.  I will follow up tomorrow.  Call me for questions.  #[phone]

## 2016-03-15 NOTE — Progress Notes (Signed)
STROKE TEAM PROGRESS NOTE   SUBJECTIVE (INTERVAL HISTORY) His daughter is at the bedside.  Patient reports no complaints and feels he is back to his baseline  OBJECTIVE Temp:  [97.5 F (36.4 C)-97.9 F (36.6 C)] 97.6 F (36.4 C) (02/22 0940) Pulse Rate:  [69-75] 71 (02/22 0940) Cardiac Rhythm: Normal sinus rhythm (02/22 0703) Resp:  [18-20] 20 (02/22 0940) BP: (148-187)/(65-79) 154/65 (02/22 0940) SpO2:  [99 %-100 %] 100 % (02/22 0940)  CBC:   Recent Labs Lab 03/13/16 1301 03/15/16 0204  WBC 6.2 8.3  NEUTROABS 3.1  --   HGB 13.8 13.0  HCT 41.1 39.8  MCV 82.5 82.6  PLT 195 989    Basic Metabolic Panel:   Recent Labs Lab 03/14/16 0321 03/15/16 0204  NA 136 139  K 3.8 4.3  CL 106 107  CO2 24 23  GLUCOSE 160* 67  BUN 17 18  CREATININE 2.00* 2.04*  CALCIUM 8.7* 9.1    HgbA1c:  Lab Results  Component Value Date   HGBA1C 7.2 (H) 03/14/2016     PHYSICAL EXAM Elderly african [place] male not in distress. . Afebrile. Head is nontraumatic. Neck is supple without bruit.    Cardiac exam no murmur or gallop. Lungs are clear to auscultation. Distal pulses are well felt. Neurological Exam :  Awake alert oriented 3 with normal speech and language function. No aphasia or apraxia dysarthria. Patient is legally blind. Right pupil 3 mm sluggishly reactive. Left pupil has corneal opacity and is unreactive.Left eye ptosis Patient can perceive   light.but unable to finger count. Extraocular moments are full range but showed some secondary dysmetria. Nystagmus of the blind is present. Face is symmetric without weakness. Tongue midline. Motor system exam no upper or lower extremity drift. Symmetric and equal strength in all 4 extremities. No focal weakness. Deep tendon reflexes are depressed. Plantars are downgoing. Sensation is intact. Gait was not tested. ASSESSMENT/PLAN [name] is a 76 y.o. male with history of legally blind, CAD, cholelithiasis, carpal tunnel  byndrome, DM type 2 with PN, ED, elevated PSA, GERD, glaucoma, HTN  presenting with weakness, dizziness and unsteady gait. He did not receive IV t-PA due to being beyond treatment window for consideration and small SAH.   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 Left thalamic lacunar infarct  MRI  linear L parietal lobe acute infarct. Small L parietal SAH  TCD pending  Carotid Doppler  bilateral ICA 1-39%, no significant stenosis 2D Echo  Left ventricle: The cavity size was normal. Wall thickness was   increased in a pattern of mild LVH. The estimated ejection    fraction was 55%. Basal to mid inferior hypokinesis  LDL 76  HgbA1c 7.2  SCDs for VTE prophylaxis Diet heart healthy/carb modified Room service appropriate? Yes; Fluid consistency: Thin  aspirin 81 mg daily prior to admission, now on aspirin 81 mg daily. Changed to aspirin 325 mg daily, continue at discharge  Given baseline blindness and potential for fall, patient is not felt to be a good anticoagulation candidate, therefore, will not pursue further embolic workup  Therapy recommendations:  CLR Disposition:  CLR Hypertension  Elevated, as high as 202/100   Given secondary SAH, Systolic blood pressure goal of 140-180  labetalol prn   Long-term BP goal normotensive  Hyperlipidemia  Home meds:  Mevacor  20, resumed in hospital as Pravachol 20 (formulary change)  LDL just above goal  Continue statin at discharge  Diabetes type II  HgbA1c goal < 7.0  Other Stroke Risk Factors  Advanced age  Former Cigarette smoker  No UDS  Obesity, Body mass index is 35.51 kg/m.  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   He presented with weakness, dizziness and unsteady gait due to embolic left MCA branch infarct. Patient is legally blind and fall risk and is not a good long-term anticoagulation candidate hence we will not consider TEE and loop recorder. Keep systolic blood pressure goal 140-180. Continue home medications . Aspirin   325 mg daily. Discussed with Dr. Maryland Pink and patient and his daughter and answered questions Greater than 50% time during this 25 minute visit was spent on counseling and coordination of care about stroke risk, prevention and treatment discussion. Stroke team will sign off. Kindly call for questions. Follow-up as an outpatient in stroke clinic in 6 weeks.  Antony Contras, MD Medical Director [facility] Pager: [phone] 03/15/2016 1:42 PM  To contact Stroke Continuity provider, please refer to [email or web address] After hours, contact General Neurology

## 2016-03-15 NOTE — Telephone Encounter (Signed)
Left message advising patient to call back with questions, if she just needs a referral from dr plotnikov, please request and we can route that request to dr plotnikov---can talk with Roxi Hlavaty if other questions

## 2016-03-16 ENCOUNTER — Inpatient Hospital Stay (HOSPITAL_COMMUNITY): Payer: Medicare HMO

## 2016-03-16 ENCOUNTER — Inpatient Hospital Stay (HOSPITAL_COMMUNITY)
Admission: RE | Admit: 2016-03-16 | Discharge: 2016-03-29 | DRG: 057 | Disposition: A | Discharge status: Home Health | Payer: Medicare HMO | Source: Intra-hospital | Attending: Physical Medicine & Rehabilitation | Admitting: Physical Medicine & Rehabilitation

## 2016-03-16 DIAGNOSIS — N183 Chronic kidney disease, stage 3 unspecified: Secondary | ICD-10-CM

## 2016-03-16 DIAGNOSIS — H547 Unspecified visual loss: Secondary | ICD-10-CM | POA: Diagnosis not present

## 2016-03-16 DIAGNOSIS — R0989 Other specified symptoms and signs involving the circulatory and respiratory systems: Secondary | ICD-10-CM | POA: Diagnosis not present

## 2016-03-16 DIAGNOSIS — E11311 Type 2 diabetes mellitus with unspecified diabetic retinopathy with macular edema: Secondary | ICD-10-CM

## 2016-03-16 DIAGNOSIS — I129 Hypertensive chronic kidney disease with stage 1 through stage 4 chronic kidney disease, or unspecified chronic kidney disease: Secondary | ICD-10-CM | POA: Diagnosis not present

## 2016-03-16 DIAGNOSIS — E785 Hyperlipidemia, unspecified: Secondary | ICD-10-CM | POA: Diagnosis present

## 2016-03-16 DIAGNOSIS — Z9109 Other allergy status, other than to drugs and biological substances: Secondary | ICD-10-CM

## 2016-03-16 DIAGNOSIS — K5900 Constipation, unspecified: Secondary | ICD-10-CM | POA: Diagnosis present

## 2016-03-16 DIAGNOSIS — E1139 Type 2 diabetes mellitus with other diabetic ophthalmic complication: Secondary | ICD-10-CM | POA: Diagnosis present

## 2016-03-16 DIAGNOSIS — E1142 Type 2 diabetes mellitus with diabetic polyneuropathy: Secondary | ICD-10-CM | POA: Diagnosis not present

## 2016-03-16 DIAGNOSIS — I2510 Atherosclerotic heart disease of native coronary artery without angina pectoris: Secondary | ICD-10-CM | POA: Diagnosis not present

## 2016-03-16 DIAGNOSIS — E1122 Type 2 diabetes mellitus with diabetic chronic kidney disease: Secondary | ICD-10-CM | POA: Diagnosis present

## 2016-03-16 DIAGNOSIS — Z79899 Other long term (current) drug therapy: Secondary | ICD-10-CM | POA: Diagnosis not present

## 2016-03-16 DIAGNOSIS — Z8249 Family history of ischemic heart disease and other diseases of the circulatory system: Secondary | ICD-10-CM

## 2016-03-16 DIAGNOSIS — N400 Enlarged prostate without lower urinary tract symptoms: Secondary | ICD-10-CM | POA: Diagnosis present

## 2016-03-16 DIAGNOSIS — H548 Legal blindness, as defined in USA: Secondary | ICD-10-CM | POA: Diagnosis present

## 2016-03-16 DIAGNOSIS — I69351 Hemiplegia and hemiparesis following cerebral infarction affecting right dominant side: Principal | ICD-10-CM

## 2016-03-16 DIAGNOSIS — Z87891 Personal history of nicotine dependence: Secondary | ICD-10-CM | POA: Diagnosis not present

## 2016-03-16 DIAGNOSIS — Z7982 Long term (current) use of aspirin: Secondary | ICD-10-CM

## 2016-03-16 DIAGNOSIS — Z841 Family history of disorders of kidney and ureter: Secondary | ICD-10-CM | POA: Diagnosis not present

## 2016-03-16 DIAGNOSIS — I638 Other cerebral infarction: Secondary | ICD-10-CM

## 2016-03-16 DIAGNOSIS — Z7984 Long term (current) use of oral hypoglycemic drugs: Secondary | ICD-10-CM | POA: Diagnosis not present

## 2016-03-16 DIAGNOSIS — N184 Chronic kidney disease, stage 4 (severe): Secondary | ICD-10-CM | POA: Diagnosis not present

## 2016-03-16 DIAGNOSIS — Z833 Family history of diabetes mellitus: Secondary | ICD-10-CM | POA: Diagnosis not present

## 2016-03-16 DIAGNOSIS — I6389 Other cerebral infarction: Secondary | ICD-10-CM | POA: Diagnosis present

## 2016-03-16 DIAGNOSIS — I639 Cerebral infarction, unspecified: Secondary | ICD-10-CM

## 2016-03-16 DIAGNOSIS — I10 Essential (primary) hypertension: Secondary | ICD-10-CM | POA: Diagnosis not present

## 2016-03-16 DIAGNOSIS — N179 Acute kidney failure, unspecified: Secondary | ICD-10-CM | POA: Diagnosis not present

## 2016-03-16 LAB — GLUCOSE, CAPILLARY
GLUCOSE-CAPILLARY: 111 mg/dL — AB (ref 65–99)
Glucose-Capillary: 138 mg/dL — ABNORMAL HIGH (ref 65–99)

## 2016-03-16 MED ORDER — SENNOSIDES-DOCUSATE SODIUM 8.6-50 MG PO TABS: 1.0000 | ORAL_TABLET | Freq: Every evening | ORAL | Status: DC | PRN

## 2016-03-16 MED ORDER — ONDANSETRON HCL 4 MG PO TABS: 4.0000 mg | ORAL_TABLET | Freq: Four times a day (QID) | ORAL | Status: DC | PRN

## 2016-03-16 MED ORDER — ONDANSETRON HCL 4 MG/2ML IJ SOLN: 4.0000 mg | Freq: Four times a day (QID) | INTRAMUSCULAR | Status: DC | PRN

## 2016-03-16 MED ORDER — METOPROLOL SUCCINATE ER 50 MG PO TB24
75.0000 mg | ORAL_TABLET | Freq: Every day | ORAL | Status: DC
  Administered 2016-03-17 – 2016-03-29 (×13): 75 mg via ORAL
  Filled 2016-03-16 (×13): qty 1

## 2016-03-16 MED ORDER — LINAGLIPTIN 5 MG PO TABS
5.0000 mg | ORAL_TABLET | Freq: Every day | ORAL | Status: DC
  Administered 2016-03-17 – 2016-03-29 (×13): 5 mg via ORAL
  Filled 2016-03-16 (×13): qty 1

## 2016-03-16 MED ORDER — ASPIRIN EC 325 MG PO TBEC
325.0000 mg | DELAYED_RELEASE_TABLET | Freq: Every day | ORAL | Status: DC
  Administered 2016-03-17 – 2016-03-29 (×13): 325 mg via ORAL
  Filled 2016-03-16 (×13): qty 1

## 2016-03-16 MED ORDER — FLUOXETINE HCL 20 MG PO CAPS
20.0000 mg | ORAL_CAPSULE | Freq: Every day | ORAL | Status: DC
  Administered 2016-03-17 – 2016-03-29 (×13): 20 mg via ORAL
  Filled 2016-03-16 (×13): qty 1

## 2016-03-16 MED ORDER — PRAVASTATIN SODIUM 20 MG PO TABS
20.0000 mg | ORAL_TABLET | Freq: Every day | ORAL | Status: DC
  Administered 2016-03-17 – 2016-03-28 (×12): 20 mg via ORAL
  Filled 2016-03-16 (×12): qty 1

## 2016-03-16 MED ORDER — FINASTERIDE 5 MG PO TABS
5.0000 mg | ORAL_TABLET | Freq: Every day | ORAL | Status: DC
  Administered 2016-03-17 – 2016-03-29 (×13): 5 mg via ORAL
  Filled 2016-03-16 (×13): qty 1

## 2016-03-16 MED ORDER — PANTOPRAZOLE SODIUM 40 MG PO TBEC
40.0000 mg | DELAYED_RELEASE_TABLET | Freq: Every day | ORAL | Status: DC
  Administered 2016-03-17 – 2016-03-29 (×13): 40 mg via ORAL
  Filled 2016-03-16 (×13): qty 1

## 2016-03-16 MED ORDER — SORBITOL 70 % SOLN: 30.0000 mL | Freq: Every day | Status: DC | PRN

## 2016-03-16 MED ORDER — LINACLOTIDE 290 MCG PO CAPS
290.0000 ug | ORAL_CAPSULE | Freq: Every day | ORAL | Status: DC
Start: 2016-03-17 — End: 2016-03-29
  Administered 2016-03-17 – 2016-03-29 (×13): 290 ug via ORAL
  Filled 2016-03-16 (×13): qty 1

## 2016-03-16 MED ORDER — INSULIN ASPART 100 UNIT/ML ~~LOC~~ SOLN
0.0000 [IU] | Freq: Three times a day (TID) | SUBCUTANEOUS | Status: DC
  Administered 2016-03-17 – 2016-03-19 (×6): 1 [IU] via SUBCUTANEOUS
  Administered 2016-03-20: 2 [IU] via SUBCUTANEOUS
  Administered 2016-03-20: 1 [IU] via SUBCUTANEOUS
  Administered 2016-03-21: 2 [IU] via SUBCUTANEOUS
  Administered 2016-03-21 – 2016-03-22 (×2): 1 [IU] via SUBCUTANEOUS
  Administered 2016-03-22 – 2016-03-26 (×7): 2 [IU] via SUBCUTANEOUS
  Administered 2016-03-27: 1 [IU] via SUBCUTANEOUS
  Administered 2016-03-27: 2 [IU] via SUBCUTANEOUS
  Administered 2016-03-27 – 2016-03-28 (×3): 1 [IU] via SUBCUTANEOUS

## 2016-03-16 MED ORDER — LOSARTAN POTASSIUM 50 MG PO TABS
50.0000 mg | ORAL_TABLET | Freq: Every day | ORAL | Status: DC
  Administered 2016-03-17: 50 mg via ORAL
  Filled 2016-03-16 (×2): qty 1

## 2016-03-16 MED ORDER — POLYETHYLENE GLYCOL 3350 17 G PO PACK
17.0000 g | PACK | Freq: Every day | ORAL | Status: DC
  Administered 2016-03-17 – 2016-03-26 (×9): 17 g via ORAL
  Filled 2016-03-16 (×10): qty 1

## 2016-03-16 NOTE — Care Management Note (Signed)
Case Management Note  Patient Details  Name: Caleb Gravley Sr. MRN: [id number] Date of Birth: [birth year]  Subjective/Objective:                    Action/Plan: Pt discharging to CIR today. No further needs per CM.   Expected Discharge Date:                  Expected Discharge Plan:  [place]  In-House Referral:     Discharge planning Services     Post Acute Care Choice:    Choice offered to:     DME Arranged:    DME Agency:     HH Arranged:    [place] Agency:     Status of Service:  Completed, signed off  If discussed at [employer] of Stay Meetings, dates discussed:    Additional Comments:  Pollie Friar, RN 03/16/2016, 11:03 AM

## 2016-03-16 NOTE — Discharge Summary (Signed)
[facility]  Physician Discharge Summary   Patient ID: Caleb Kizziah Sr. MRN: [id number] DOB/AGE: December 31, 1940 76 y.o.  Admit date: 03/13/2016 Discharge date: 03/16/2016  PCP: Walker Kehr, MD  DISCHARGE DIAGNOSES:  Principal Problem:   Acute cerebrovascular accident (CVA) [facility]) Active Problems:   Hyperlipidemia   Coronary atherosclerosis   GERD   DM (diabetes mellitus), type 2 with ophthalmic complications ([place])   Chronic kidney disease (CKD), stage IV (severe) ([place])   Constipation   Situational mixed anxiety and depressive disorder   Essential hypertension    Patient being discharged to [facility] inpatient rehabilitation   DISCHARGE CONDITION: fair  Diet recommendation: Heart healthy/carb modified  Filed Weights   03/13/16 1249  Weight: 99.8 kg (220 lb)    INITIAL HISTORY: 76 year old African-American male with a past medical history of blindness, coronary artery disease, cholelithiasis, carpal tunnel syndrome, type 2 diabetes with peripheral neuropathy, hypertension, who was brought into the emergency department to unsteady gait. Patient found to have acute stroke. He was hospitalized.  Consultants: Neurology. Rehabilitation medicine  Procedures:  Transthoracic echocardiogram Study Conclusions  - Left ventricle: The cavity size was normal. Wall thickness was increased in a pattern of mild LVH. The estimated ejection fraction was 55%. Basal to mid inferior hypokinesis. Doppler parameters are consistent with abnormal left ventricular relaxation (grade 1 diastolic dysfunction). - Aortic valve: There was no stenosis. - Mitral valve: There was no significant regurgitation. - Right ventricle: Poorly visualized. Probably normal size and systolic function. - Pulmonary arteries: No complete TR doppler jet so unable to estimate PA systolic pressure. - Inferior vena cava: The vessel was normal in size. The respirophasic diameter changes  were in the normal range (>= 50%), consistent with normal central venous pressure.  Impressions:  - Technically difficult study with poor acoustic windows. Normal LV size with mild LV hypertrophy. EF 55%. Basal to mid inferior hypokinesis. Poorly visualized RV, but probably normal size and systolic function. No significant valvular abnormalities.  Carotid Duplex (Doppler).  Preliminary findings: Bilateral: No significant (1-39%) ICA stenosis. Antegrade vertebral flow.    HOSPITAL COURSE:   Acute cerebrovascular accident (CVA) with parietal infarct as well as subarachnoid hemorrhage Patient was admitted to the hospital. Patient underwent MRI brain. This revealed a stroke. Patient seen by neurology. Workup has been completed. Echocardiogram report as above. Carotid Doppler without any significant stenosis. LDL is 76. Patient is on a statin. Seen by physical therapy. Inpatient rehabilitation was recommended. Started on aspirin by neurology. Not a candidate for embolic workup due to other comorbidities as they would preclude anticoagulation. Plan is for discharge to [facility] inpatient rehabilitation.  Hyperlipidemia Pravastatin 20 mg by mouth daily.  History of Coronary atherosclerosis Stable. Continue aspirin, beta blocker and statin.  GERD Stable  DM (diabetes mellitus), type 2 with ophthalmic complications Continue to monitor CBGs. Sliding scale insulin coverage. HbA1c is 7.2. Patient did have an episode of hypoglycemia. Amaryl was discontinued. Continue just linagliptin (patient was on Januvia at home).  Chronic kidney disease (CKD), stage IV (severe) Stable. Check labs periodically.  Constipation Continue Linzess, MiraLAX, Senokot. Stable.  Situational mixed anxiety and depressive disorder Continue fluoxetine 20 mg by mouth daily.  Essential hypertension Permissive hypertension was allowed initially. Systolic Blood pressure goal to be between 140 and  170. Dose of metoprolol was increased. Continue losartan. Blood pressure reasonably controlled at this time.   Overall, stable. Patient excited about going to rehabilitation. Okay for discharge.  PERTINENT LABS:  The results of significant diagnostics  from this hospitalization (including imaging, microbiology, ancillary and laboratory) are listed below for reference.     Labs: Basic Metabolic Panel:  Recent Labs Lab 03/13/16 1301 03/14/16 0321 03/15/16 0204  NA 136 136 139  K 4.2 3.8 4.3  CL 106 106 107  CO2 24 24 23   GLUCOSE 150* 160* 67  BUN 20 17 18   CREATININE 2.10* 2.00* 2.04*  CALCIUM 8.7* 8.7* 9.1   Liver Function Tests:  Recent Labs Lab 03/13/16 1301 03/14/16 0321  AST 22 18  ALT 28 23  ALKPHOS 73 68  BILITOT 0.4 0.4  PROT 7.6 7.0  ALBUMIN 3.5 3.1*   CBC:  Recent Labs Lab 03/13/16 1301 03/15/16 0204  WBC 6.2 8.3  NEUTROABS 3.1  --   HGB 13.8 13.0  HCT 41.1 39.8  MCV 82.5 82.6  PLT 195 189    CBG:  Recent Labs Lab 03/15/16 1111 03/15/16 1629 03/15/16 2137 03/16/16 0621 03/16/16 1106  GLUCAP 141* 76 160* 111* 138*     IMAGING STUDIES Dg Chest 2 View  Result Date: 03/13/2016 CLINICAL DATA:  Unsteady gait and fall today. History of hypertension, diabetes. EXAM: CHEST  2 VIEW COMPARISON:  PA and lateral chest x-ray of October 27, 2015 FINDINGS: Today's study is obtained in a lordotic projection. The lungs are well-expanded and clear. The heart is top-normal in size. The pulmonary vascularity is normal. The patient has undergone previous CABG. The observed bony thorax is unremarkable. IMPRESSION: There is no active cardiopulmonary disease.  Previous CABG. Electronically Signed   By: David  Martinique M.D.   On: 03/13/2016 17:24   Ct Head Wo Contrast  Result Date: 03/13/2016 CLINICAL DATA:  Pt awoke with unsteady gait, stumbling; pt has also developed a Bilateral temporal h/a Pt denies any injury, or prior stroke EXAM: CT HEAD WITHOUT CONTRAST  TECHNIQUE: Contiguous axial images were obtained from the base of the skull through the vertex without intravenous contrast. COMPARISON:  None. FINDINGS: Brain: Small area of hypoattenuation noted in the left thalamus, likely a chronic lacune or infarct. No definite acute infarction, hemorrhage, hydrocephalus, extra-axial collection or mass lesion/mass effect. The ventricles and sulci are enlarged reflecting mild to moderate diffuse atrophy. Mild patchy white matter hypoattenuation is noted consistent with chronic microvascular ischemic change. Vascular: No hyperdense vessel or unexpected calcification. Skull: Normal. Negative for fracture or focal lesion. Sinuses/Orbits: No acute orbital abnormality. Visualized sinuses and mastoid air cells are clear. Other: None. IMPRESSION: 1. No acute intracranial abnormalities. 2. Atrophy and mild chronic microvascular ischemic change. Left thalamic lacune infarct. Electronically Signed   By: Lajean Manes M.D.   On: 03/13/2016 14:58   Mr Brain Wo Contrast  Result Date: 03/13/2016 CLINICAL DATA:  Unsteady gait.  Fall today. EXAM: MRI HEAD WITHOUT CONTRAST TECHNIQUE: Multiplanar, multiecho pulse sequences of the brain and surrounding structures were obtained without intravenous contrast. COMPARISON:  CT head 03/13/2016 FINDINGS: Brain: Linear focus of restricted diffusion in the left parietal lobe most consistent with acute infarct involving gray and white matter. This is relatively small measuring approximately 5 x 20 mm. No other area of acute infarct. Generalized atrophy of a moderate degree. Chronic microvascular ischemic change in the white matter and pons. Chronic lacunar infarct in the left thalamus. Small chronic infarct left cerebellum. Negative for mass. Chronic microhemorrhage in the right posterior temporal lobe, and left thalamus. Small area of susceptibility in the left parietal subarachnoid space which may represent a small area of acute subarachnoid  hemorrhage. This is not  definitely seen on the CT scan. No subdural fluid collection. Vascular: Normal arterial flow void. Skull and upper cervical spine: Negative Sinuses/Orbits: Negative Other: None IMPRESSION: Linear focus of restricted diffusion in the left parietal lobe most consistent with acute infarct although somewhat unusual in shape. Small area of subarachnoid hemorrhage in the left parietal region which could be acute. This is not seen on the CT. Atrophy and chronic microvascular ischemic changes as above. Electronically Signed   By: Franchot Gallo M.D.   On: 03/13/2016 19:58    DISCHARGE EXAMINATION: Vitals:   03/15/16 2133 03/16/16 0113 03/16/16 0440 03/16/16 0856  BP: (!) 149/64 (!) 163/79 (!) 168/87 (!) 130/54  Pulse: 76 71 67 68  Resp: 18 18 20 20   Temp: 97.9 F (36.6 C) 97.9 F (36.6 C) 97.7 F (36.5 C) 97.7 F (36.5 C)  TempSrc: Oral Oral Oral Oral  SpO2: 100% 100% 100% 98%  Weight:      Height:       General appearance: alert, cooperative, appears stated age and no distress. Visually impaired. Resp: clear to auscultation bilaterally Cardio: regular rate and rhythm, S1, S2 normal, no murmur, click, rub or gallop GI: soft, non-tender; bowel sounds normal; no masses,  no organomegaly  DISPOSITION: To [facility] inpatient rehabilitation  Discharge Instructions    Ambulatory referral to Neurology    Complete by:  As directed    Stroke patient. Dr. Leonie Man prefers follow up in 6 weeks      ALLERGIES:  Allergies  Allergen Reactions  . Benazepril Hcl Cough  . Hydroxyzine Other (See Comments)    Visual hallucinations  . Verapamil Other (See Comments)    Hallucinations     Current Inpatient Medications:  Scheduled: . aspirin EC  325 mg Oral Daily  . finasteride  5 mg Oral Daily  . FLUoxetine  20 mg Oral Daily  . insulin aspart  0-9 Units Subcutaneous TID WC  . linaclotide  290 mcg Oral Daily  . linagliptin  5 mg Oral Daily  . losartan  50 mg Oral Daily  .  metoprolol succinate  75 mg Oral Daily  . pantoprazole  40 mg Oral Daily  . polyethylene glycol  17 g Oral Daily  . pravastatin  20 mg Oral q1800  . sodium chloride flush  3 mL Intravenous Q12H  . triamcinolone ointment  1 application Topical BID   Continuous:  [id number], senna-docusate   Follow-up Information    SETHI,PRAMOD, MD Follow up in 6 week(s).   Specialties:  Neurology, Radiology Why:  stroke clinic. office will call with appt date and time Contact information: [address] [place] [address] [place]: 35 mins  [facility] Pager [phone]  03/16/2016, 11:10 AM

## 2016-03-16 NOTE — Progress Notes (Signed)
Rehab admissions - I have approval from [facility] for acute inpatient rehab admission for today.  I met with patient and he is agreeable.  Bed available and will admit to acute inpatient rehab today.  Call me for questions.  #[phone]

## 2016-03-16 NOTE — Progress Notes (Signed)
Pt discharge to CIR and report called off to 4w RN. Pt transported off unit via bed with belongings to the side. All education and instructions completed. Pt telemetry removed; pt IV intact and saline locked to right RFA. Delia Heady RN

## 2016-03-16 NOTE — Progress Notes (Signed)
Vascular Ultrasound Transcranial Doppler has been completed.    03/16/2016 3:15 PM Maudry Mayhew, BS, RVT, RDCS, RDMS

## 2016-03-16 NOTE — H&P (Signed)
Physical Medicine and Rehabilitation Admission H&P       Chief Complaint  Patient presents with  . fall/unsteady gait  : HPI: Caleb Dunnis a 76 y.o.right handed malewith history significant legally blind, CAD maintained on aspirin, type 2 diabetes mellitus and peripheral neuropathy. Per chart review patient lives alone [facility] His daughters and family members assist with finances appointments and meals. He presented 03/13/2016 with dizziness, altered mental status and unsteady gait. Cranial CT scan negative. MRI showed restricted diffusion in the left parietal lobe consistent with acute infarct. Small area of subarachnoid hemorrhage in the left parietal region felt to be possibly acute. Patient did not receive TPA. Echocardiogram with ejection fraction of 83% grade 1 diastolic dysfunction. Carotid Dopplers with no ICA stenosis. Neurology consulted presently on aspirin for CVA prophylaxis. No plan for TEE or loop recorder. Tolerating a regular consistency diet. Physical and occupational therapy evaluation completed with recommendations of physical medicine rehabilitation consult.Patient was admitted for comprehensive rehabilitation program  Review of Systems  Constitutional: Negative for chills and fever.  HENT: Negative for hearing loss and tinnitus.   Eyes:       Legally blind  Respiratory: Negative for cough and shortness of breath.   Cardiovascular: Positive for leg swelling. Negative for chest pain and palpitations.  Gastrointestinal: Positive for constipation. Negative for nausea and vomiting.       GERD  Genitourinary: Positive for urgency. Negative for dysuria and hematuria.  Musculoskeletal: Positive for myalgias.  Skin: Negative for rash.  Neurological: Positive for dizziness and weakness.  Psychiatric/Behavioral: Positive for depression.  All other systems reviewed and are negative.      Past Medical  History:  Diagnosis Date  . Blindness   . CAD (coronary artery disease)   . Cholelithiasis   . CTS (carpal tunnel syndrome)    Left  . DM type 2 with diabetic peripheral neuropathy ([place])   . ED (erectile dysfunction)   . Elevated PSA   . GERD (gastroesophageal reflux disease)   . Glaucoma   . HTN (hypertension)   . Legal blindness due to diabetes mellitus ([place])   . MI (myocardial infarction) 1991  . Thyroid nodule   . Type II or unspecified type diabetes mellitus without mention of complication, not stated as uncontrolled         Past Surgical History:  Procedure Laterality Date  . CHOLECYSTECTOMY           Family History  Problem Relation Age of Onset  . Hypertension Mother   . Kidney disease Father   . Hypertension Father   . Coronary artery disease      1st degree Male relative <60  . Diabetes      1st degree relative   Social History:  reports that he has quit smoking. He has never used smokeless tobacco. He reports that he does not drink alcohol or use drugs. Allergies:       Allergies  Allergen Reactions  . Benazepril Hcl Cough  . Hydroxyzine Other (See Comments)    Visual hallucinations  . Verapamil Other (See Comments)    Hallucinations          Medications Prior to Admission  Medication Sig Dispense Refill  . aspirin 81 MG tablet Take 1 tablet (81 mg total) by mouth daily. 100 tablet 3  . cholecalciferol (VITAMIN D) 1000 UNITS tablet Take 1 tablet (1,000 Units total) by mouth daily. 100 tablet 3  .  Emollient (EUCERIN) lotion Apply topically as needed for dry skin. Use bid 480 mL 3  . FLUoxetine (PROZAC) 20 MG tablet Take 1 tablet (20 mg total) by mouth daily. 30 tablet 5  . hydrOXYzine (ATARAX/VISTARIL) 25 MG tablet Take 25 mg by mouth 3 (three) times daily as needed.    Marland Kitchen JANUVIA 50 MG tablet TAKE ONE TABLET BY MOUTH ONCE DAILY 30 tablet 3  . losartan (COZAAR) 50 MG tablet Take 1 tablet (50 mg total) by mouth  daily. 90 tablet 2  . lovastatin (MEVACOR) 20 MG tablet Take 1 tablet (20 mg total) by mouth daily. For Cholesterol 90 tablet 3  . metoprolol succinate (TOPROL-XL) 50 MG 24 hr tablet Take 1 tablet (50 mg total) by mouth daily. For Blood Pressure 90 tablet 2  . polyethylene glycol (MIRALAX / GLYCOLAX) packet Take 17 g by mouth daily. (Patient taking differently: Take 17 g by mouth daily as needed for moderate constipation. ) 30 each 5  . Blood Glucose Monitoring Suppl (PRODIGY BLOOD GLUCOSE MONITOR) DEVI As dirrected (Patient not taking: Reported on 03/14/2016) 1 each 0  . finasteride (PROSCAR) 5 MG tablet Take 1 tablet (5 mg total) by mouth daily. For Prostate (Patient not taking: Reported on 03/14/2016) 90 tablet 3  . glimepiride (AMARYL) 2 MG tablet TAKE 1 TABLET TWICE DAILY FOR DIABETES (Patient not taking: Reported on 03/14/2016) 180 tablet 2  . glucose blood test strip 1 each by Other route 2 (two) times daily. Use to check blood sugars twice a day Dx E11.8 (Patient not taking: Reported on 03/14/2016) 300 each 3  . Incontinence Supply Disposable (DEPEND FITTED BRIEFS SM/MED) MISC Use tid prn (Patient not taking: Reported on 03/14/2016) 100 each 6  . Linaclotide (LINZESS) 290 MCG CAPS capsule Take 1 capsule (290 mcg total) by mouth daily. (Patient not taking: Reported on 03/14/2016) 30 capsule 11  . PRODIGY LANCETS 28G MISC 28 g by Other route 2 (two) times daily. Use to help check blood sugars twice a day Dx E11.8 (Patient not taking: Reported on 03/14/2016) 300 each 3  . senna-docusate (SENOKOT-S) 8.6-50 MG tablet Take 1 tablet by mouth at bedtime as needed for mild constipation. (Patient not taking: Reported on 03/14/2016) 30 tablet 1  . triamcinolone ointment (KENALOG) 0.5 % Apply 1 application topically 2 (two) times daily. On palms (Patient not taking: Reported on 03/14/2016) 30 g 1    Home: Home Living Family/patient expects to be discharged to:: Private residence ([place] [facility]) Living Arrangements: Alone Available Help at Discharge: Family, Available PRN/intermittently (daughters) Type of Home: Apartment Home Access: Level entry Home Layout: One level Bathroom Shower/Tub: Chiropodist: Standard Home Equipment: Radio producer - single point  Lives With: Alone   Functional History: Prior Function Level of Independence: Needs assistance Gait / Transfers Assistance Needed: Utilizing cane ADL's / Homemaking Assistance Needed: Daughters assist with financial management, cooking, cleaning, and providing set-up for ADL.  Functional Status:  Mobility: Bed Mobility Overal bed mobility: Needs Assistance Bed Mobility: Sit to Supine Supine to sit: Min assist Sit to supine: Supervision General bed mobility comments: OOB in chair on OT arrival. Transfers Overall transfer level: Needs assistance Equipment used: Rolling walker (2 wheeled) Transfers: Sit to/from Stand Sit to Stand: Min guard General transfer comment: min guard from recliner chair Ambulation/Gait Ambulation/Gait assistance: Min assist Ambulation Distance (Feet): 150 Feet Assistive device: Straight cane, 1 person hand held assist Gait Pattern/deviations: Step-through pattern, Wide base of support General Gait Details: mild  incoordination with placement of cane Rt hand, left HHA, mod cues for path finding in unfamiliar environment Gait velocity: slower Gait velocity interpretation: Below normal speed for age/gender  ADL: ADL Overall ADL's : Needs assistance/impaired Eating/Feeding: Set up, Sitting Grooming: Set up, Sitting Upper Body Bathing: Set up, Sitting Lower Body Bathing: Minimal assistance, Sit to/from stand Upper Body Dressing : Set up, Sitting Lower Body Dressing: Minimal assistance, Sit to/from stand Toilet Transfer: Minimal assistance, BSC, Ambulation, RW Toilet Transfer Details (indicate cue type and reason): Min assist to maintain balance with RW. Toileting-  Clothing Manipulation and Hygiene: Minimal assistance, Sit to/from stand Functional mobility during ADLs: Minimal assistance, Rolling walker General ADL Comments: Limited by visual deficits and unfamiliar environment. Improved R UE coordination noted during ADL tasks with   Cognition: Cognition Overall Cognitive Status: Impaired/Different from baseline Arousal/Alertness: Awake/alert Orientation Level: Oriented X4 Attention: Sustained Sustained Attention: Impaired Sustained Attention Impairment: Verbal basic Memory: Impaired Memory Impairment: Retrieval deficit, Decreased short term memory, Storage deficit Decreased Short Term Memory: Verbal basic Awareness: Appears intact Problem Solving: Appears intact (for basic info) Safety/Judgment: Appears intact Cognition Arousal/Alertness: Awake/alert Behavior During Therapy: WFL for tasks assessed/performed Overall Cognitive Status: Impaired/Different from baseline Area of Impairment: Memory, Following commands, Problem solving Memory: Decreased short-term memory Following Commands: Follows multi-step commands inconsistently Problem Solving: Requires verbal cues, Difficulty sequencing General Comments: Pt with decreased short-term memory and requires verbal cues for sequencing multi-step tasks.  Physical Exam: Blood pressure (!) 168/87, pulse 67, temperature 97.7 F (36.5 C), temperature source Oral, resp. rate 20, height '5\' 6"'  (1.676 m), weight 99.8 kg (220 lb), SpO2 100 %. Physical Exam  Vitals reviewed. Constitutional: No distress.  HENT:  Head: Normocephalic and atraumatic.  Eyes: EOM are normal.  Neck: Normal range of motion. Neck supple. No tracheal deviation present. No thyromegaly present.  Cardiovascular: Normal rate, regular rhythm and normal heart sounds.  Exam reveals no friction rub.   No murmur heard. Respiratory: Effort normal and breath sounds normal. No respiratory distress. He has no wheezes.  GI: Soft. Bowel  sounds are normal. He exhibits no distension. There is no tenderness.  Musculoskeletal: He exhibits no edema.  Skin: He is not diaphoretic.  Psychiatric: He has a normal mood and affect. His behavior is normal.  Skin. warm and dry Neurological: He is alertand oriented to person, place, and time. He exhibits normal muscle tone.  Blind--sees light /dark only. EOMI grossly intat.  Follows basic commands.Fair awareness and insight. Right central 7. Speech slightly dysarthric. RUE 4 to 4+/5 deltoid, biceps, triceps, wrist, hand. LUE 5/5. RLE 4-/5 HF, 4/5 KE and ADF/PF. LLE 4+/5. Decreased LT RUE and RLE (1+/2) as well as decreased [place] on right. No PD.    Lab Results Last 48 Hours        Results for orders placed or performed during the hospital encounter of 03/13/16 (from the past 48 hour(s))  Glucose, capillary     Status: Abnormal   Collection Time: 03/14/16  8:53 AM  Result Value Ref Range   Glucose-Capillary 169 (H) 65 - 99 mg/dL  Glucose, capillary     Status: Abnormal   Collection Time: 03/14/16 12:04 PM  Result Value Ref Range   Glucose-Capillary 116 (H) 65 - 99 mg/dL  Glucose, capillary     Status: Abnormal   Collection Time: 03/14/16  4:30 PM  Result Value Ref Range   Glucose-Capillary 132 (H) 65 - 99 mg/dL  Glucose, capillary     Status: Abnormal  Collection Time: 03/14/16  8:43 PM  Result Value Ref Range   Glucose-Capillary 126 (H) 65 - 99 mg/dL   Comment 1 Notify RN    Comment 2 Document in Chart   CBC     Status: None   Collection Time: 03/15/16  2:04 AM  Result Value Ref Range   WBC 8.3 4.0 - 10.5 K/uL   RBC 4.82 4.22 - 5.81 MIL/uL   Hemoglobin 13.0 13.0 - 17.0 g/dL   HCT 39.8 39.0 - 52.0 %   MCV 82.6 78.0 - 100.0 fL   MCH 27.0 26.0 - 34.0 pg   MCHC 32.7 30.0 - 36.0 g/dL   RDW 13.5 11.5 - 15.5 %   Platelets 189 150 - 400 K/uL  Basic metabolic panel     Status: Abnormal   Collection Time: 03/15/16  2:04 AM  Result Value Ref Range    Sodium 139 135 - 145 mmol/L   Potassium 4.3 3.5 - 5.1 mmol/L   Chloride 107 101 - 111 mmol/L   CO2 23 22 - 32 mmol/L   Glucose, Bld 67 65 - 99 mg/dL   BUN 18 6 - 20 mg/dL   Creatinine, Ser 2.04 (H) 0.61 - 1.24 mg/dL   Calcium 9.1 8.9 - 10.3 mg/dL   GFR calc non Af Amer 30 (L) >60 mL/min   GFR calc Af Amer 35 (L) >60 mL/min    Comment: (NOTE) The eGFR has been calculated using the CKD EPI equation. This calculation has not been validated in all clinical situations. eGFR's persistently <60 mL/min signify possible Chronic Kidney Disease.    Anion gap 9 5 - 15  Glucose, capillary     Status: Abnormal   Collection Time: 03/15/16  7:00 AM  Result Value Ref Range   Glucose-Capillary 61 (L) 65 - 99 mg/dL   Comment 1 Notify RN    Comment 2 Document in Chart   Glucose, capillary     Status: Abnormal   Collection Time: 03/15/16  8:00 AM  Result Value Ref Range   Glucose-Capillary 122 (H) 65 - 99 mg/dL   Comment 1 Notify RN    Comment 2 Document in Chart   Glucose, capillary     Status: Abnormal   Collection Time: 03/15/16 11:11 AM  Result Value Ref Range   Glucose-Capillary 141 (H) 65 - 99 mg/dL   Comment 1 Notify RN    Comment 2 Document in Chart   Glucose, capillary     Status: None   Collection Time: 03/15/16  4:29 PM  Result Value Ref Range   Glucose-Capillary 76 65 - 99 mg/dL   Comment 1 Notify RN    Comment 2 Document in Chart   Glucose, capillary     Status: Abnormal   Collection Time: 03/15/16  9:37 PM  Result Value Ref Range   Glucose-Capillary 160 (H) 65 - 99 mg/dL   Comment 1 Notify RN    Comment 2 Document in Chart   Glucose, capillary     Status: Abnormal   Collection Time: 03/16/16  6:21 AM  Result Value Ref Range   Glucose-Capillary 111 (H) 65 - 99 mg/dL   Comment 1 Notify RN    Comment 2 Document in Chart      Imaging Results (Last 48 hours)  No results found.       Medical Problem List and  Plan: 1.  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  DVT Prophylaxis/Anticoagulation: SCDs. Monitor for any signs of DVT 3. Pain Management: Tylenol as needed 4. Mood: Provide emotional support. Continue Prozac 20 mg daily. 5. Neuropsych: This patient is capable of making decisions on his own behalf. 6. Skin/Wound Care: Routine skin checks 7. Fluids/Electrolytes/Nutrition: Routine I&O with follow-up chemistries to be performed             -encourage adequate po intake             -education regarding appropriate food choices 8. Legally blind. Patient used a cane prior to admission was able to navigate about his apartment 9. Hypertension. Cozaar 50 mg daily, Toprol-XL 75 mg daily. Monitor with increased mobility 10. Diabetes mellitus peripheral neuropathy. Hemoglobin A1c 7.2. Trandjenta 5 mg daily. Check blood sugars before meals and at bedtime. Diabetic teaching appropriate. 11. CAD. Continue aspirin therapy. No chest pain or shortness of breath 12.BPH.Proscar 7m daily.Check PVR 3 13. Hyperlipidemia. Pravachol 20 mg daily 14. Constipation.linzess 290 mcg daily,Miralax daily  Post Admission Physician Evaluation: 1. Functional deficits secondary  to left parietal infarct. 2. Patient is admitted to receive collaborative, interdisciplinary care between the physiatrist, rehab nursing staff, and therapy team. 3. Patient's level of medical complexity and substantial therapy needs in context of that medical necessity cannot be provided at a lesser intensity of care such as a SNF. 4. Patient has experienced substantial functional loss from his/her baseline which was documented above under the "Functional History" and "Functional Status" headings.  Judging by the patient's diagnosis, physical exam, and functional history, the patient has potential for functional progress which will result in measurable gains while on inpatient  rehab.  These gains will be of substantial and practical use upon discharge  in facilitating mobility and self-care at the household level. 5. Physiatrist will provide 24 hour management of medical needs as well as oversight of the therapy plan/treatment and provide guidance as appropriate regarding the interaction of the two. 6. The Preadmission Screening has been reviewed and patient status is unchanged unless otherwise stated above. 7. 24 hour rehab nursing will assist with bladder management, bowel management, safety, skin/wound care, disease management, medication administration, pain management and patient education  and help integrate therapy concepts, techniques,education, etc. 8. PT will assess and treat for/with: Lower extremity strength, range of motion, stamina, balance, functional mobility, safety, adaptive techniques and equipment, NMR, visual-spatial awareness, .   Goals are: mod I to supervision. 9. OT will assess and treat for/with: ADL's, functional mobility, safety, upper extremity strength, adaptive techniques and equipment, visual-spatial awareness, stroke education, family ed.   Goals are: mod I to set up. Therapy may proceed with showering this patient. 10. SLP will assess and treat for/with: cognition, speech, communication.  Goals are: mod I. 11. Case Management and Social Worker will assess and treat for psychological issues and discharge planning. 12. Team conference will be held weekly to assess progress toward goals and to determine barriers to discharge. 13. Patient will receive at least 3 hours of therapy per day at least 5 days per week. 14. ELOS: 7-11 days       15. Prognosis:  excellent     ZMeredith Staggers MD, FWacoPhysical Medicine & Rehabilitation 03/16/2016  ACathlyn Parsons, PA-C 03/16/2016

## 2016-03-16 NOTE — H&P (Signed)
Physical Medicine and Rehabilitation Admission H&P    Chief Complaint  Patient presents with  . fall/unsteady gait  : HPI: Caleb Colgan Sr. is a 76 y.o. right handed male with history significant legally blind, CAD maintained on aspirin, type 2 diabetes mellitus and peripheral neuropathy. Per chart review patient lives alone uses a cane in a independent living facility [facility]. His daughters and family members assist with finances appointments and meals. He presented 03/13/2016 with dizziness, altered mental status and unsteady gait. Cranial CT scan negative. MRI showed restricted diffusion in the left parietal lobe consistent with acute infarct. Small area of subarachnoid hemorrhage in the left parietal region felt to be possibly acute. Patient did not receive TPA. Echocardiogram with ejection fraction of 62% grade 1 diastolic dysfunction. Carotid Dopplers with no ICA stenosis. Neurology consulted presently on aspirin for CVA prophylaxis. No plan for TEE or loop recorder. Tolerating a regular consistency diet. Physical and occupational therapy evaluation completed with recommendations of physical medicine rehabilitation consult.Patient was admitted for comprehensive rehabilitation program  Review of Systems  Constitutional: Negative for chills and fever.  HENT: Negative for hearing loss and tinnitus.   Eyes:       Legally blind  Respiratory: Negative for cough and shortness of breath.   Cardiovascular: Positive for leg swelling. Negative for chest pain and palpitations.  Gastrointestinal: Positive for constipation. Negative for nausea and vomiting.       GERD  Genitourinary: Positive for urgency. Negative for dysuria and hematuria.  Musculoskeletal: Positive for myalgias.  Skin: Negative for rash.  Neurological: Positive for dizziness and weakness.  Psychiatric/Behavioral: Positive for depression.  All other systems reviewed and are negative.  Past Medical History:    Diagnosis Date  . Blindness   . CAD (coronary artery disease)   . Cholelithiasis   . CTS (carpal tunnel syndrome)    Left  . DM type 2 with diabetic peripheral neuropathy ([place])   . ED (erectile dysfunction)   . Elevated PSA   . GERD (gastroesophageal reflux disease)   . Glaucoma   . HTN (hypertension)   . Legal blindness due to diabetes mellitus ([place])   . MI (myocardial infarction) 1991  . Thyroid nodule   . Type II or unspecified type diabetes mellitus without mention of complication, not stated as uncontrolled    Past Surgical History:  Procedure Laterality Date  . CHOLECYSTECTOMY     Family History  Problem Relation Age of Onset  . Hypertension Mother   . Kidney disease Father   . Hypertension Father   . Coronary artery disease      1st degree Male relative <60  . Diabetes      1st degree relative   Social History:  reports that he has quit smoking. He has never used smokeless tobacco. He reports that he does not drink alcohol or use drugs. Allergies:  Allergies  Allergen Reactions  . Benazepril Hcl Cough  . Hydroxyzine Other (See Comments)    Visual hallucinations  . Verapamil Other (See Comments)    Hallucinations    Medications Prior to Admission  Medication Sig Dispense Refill  . aspirin 81 MG tablet Take 1 tablet (81 mg total) by mouth daily. 100 tablet 3  . cholecalciferol (VITAMIN D) 1000 UNITS tablet Take 1 tablet (1,000 Units total) by mouth daily. 100 tablet 3  . Emollient (EUCERIN) lotion Apply topically as needed for dry skin. Use bid 480 mL 3  . FLUoxetine (PROZAC) 20 MG tablet  Take 1 tablet (20 mg total) by mouth daily. 30 tablet 5  . hydrOXYzine (ATARAX/VISTARIL) 25 MG tablet Take 25 mg by mouth 3 (three) times daily as needed.    Marland Kitchen JANUVIA 50 MG tablet TAKE ONE TABLET BY MOUTH ONCE DAILY 30 tablet 3  . losartan (COZAAR) 50 MG tablet Take 1 tablet (50 mg total) by mouth daily. 90 tablet 2  . lovastatin (MEVACOR) 20 MG tablet Take 1 tablet (20 mg  total) by mouth daily. For Cholesterol 90 tablet 3  . metoprolol succinate (TOPROL-XL) 50 MG 24 hr tablet Take 1 tablet (50 mg total) by mouth daily. For Blood Pressure 90 tablet 2  . polyethylene glycol (MIRALAX / GLYCOLAX) packet Take 17 g by mouth daily. (Patient taking differently: Take 17 g by mouth daily as needed for moderate constipation. ) 30 each 5  . Blood Glucose Monitoring Suppl (PRODIGY BLOOD GLUCOSE MONITOR) DEVI As dirrected (Patient not taking: Reported on 03/14/2016) 1 each 0  . finasteride (PROSCAR) 5 MG tablet Take 1 tablet (5 mg total) by mouth daily. For Prostate (Patient not taking: Reported on 03/14/2016) 90 tablet 3  . glimepiride (AMARYL) 2 MG tablet TAKE 1 TABLET TWICE DAILY FOR DIABETES (Patient not taking: Reported on 03/14/2016) 180 tablet 2  . glucose blood test strip 1 each by Other route 2 (two) times daily. Use to check blood sugars twice a day Dx E11.8 (Patient not taking: Reported on 03/14/2016) 300 each 3  . Incontinence Supply Disposable (DEPEND FITTED BRIEFS SM/MED) MISC Use tid prn (Patient not taking: Reported on 03/14/2016) 100 each 6  . Linaclotide (LINZESS) 290 MCG CAPS capsule Take 1 capsule (290 mcg total) by mouth daily. (Patient not taking: Reported on 03/14/2016) 30 capsule 11  . PRODIGY LANCETS 28G MISC 28 g by Other route 2 (two) times daily. Use to help check blood sugars twice a day Dx E11.8 (Patient not taking: Reported on 03/14/2016) 300 each 3  . senna-docusate (SENOKOT-S) 8.6-50 MG tablet Take 1 tablet by mouth at bedtime as needed for mild constipation. (Patient not taking: Reported on 03/14/2016) 30 tablet 1  . triamcinolone ointment (KENALOG) 0.5 % Apply 1 application topically 2 (two) times daily. On palms (Patient not taking: Reported on 03/14/2016) 30 g 1    Home: Home Living Family/patient expects to be discharged to:: Private residence ([facility]) Living Arrangements: Alone Available Help at Discharge: Family, Available  PRN/intermittently (daughters) Type of Home: Apartment Home Access: Level entry Home Layout: One level Bathroom Shower/Tub: Chiropodist: Standard Home Equipment: Radio producer - single point  Lives With: Alone   Functional History: Prior Function Level of Independence: Needs assistance Gait / Transfers Assistance Needed: Utilizing cane ADL's / Homemaking Assistance Needed: Daughters assist with financial management, cooking, cleaning, and providing set-up for ADL.  Functional Status:  Mobility: Bed Mobility Overal bed mobility: Needs Assistance Bed Mobility: Sit to Supine Supine to sit: Min assist Sit to supine: Supervision General bed mobility comments: OOB in chair on OT arrival. Transfers Overall transfer level: Needs assistance Equipment used: Rolling walker (2 wheeled) Transfers: Sit to/from Stand Sit to Stand: Min guard General transfer comment: min guard from recliner chair Ambulation/Gait Ambulation/Gait assistance: Min assist Ambulation Distance (Feet): 150 Feet Assistive device: Straight cane, 1 person hand held assist Gait Pattern/deviations: Step-through pattern, Wide base of support General Gait Details: mild incoordination with placement of cane Rt hand, left HHA, mod cues for path finding in unfamiliar environment Gait velocity: slower Gait velocity  interpretation: Below normal speed for age/gender    ADL: ADL Overall ADL's : Needs assistance/impaired Eating/Feeding: Set up, Sitting Grooming: Set up, Sitting Upper Body Bathing: Set up, Sitting Lower Body Bathing: Minimal assistance, Sit to/from stand Upper Body Dressing : Set up, Sitting Lower Body Dressing: Minimal assistance, Sit to/from stand Toilet Transfer: Minimal assistance, BSC, Ambulation, RW Toilet Transfer Details (indicate cue type and reason): Min assist to maintain balance with RW. Toileting- Clothing Manipulation and Hygiene: Minimal assistance, Sit to/from stand Functional  mobility during ADLs: Minimal assistance, Rolling walker General ADL Comments: Limited by visual deficits and unfamiliar environment. Improved R UE coordination noted during ADL tasks with   Cognition: Cognition Overall Cognitive Status: Impaired/Different from baseline Arousal/Alertness: Awake/alert Orientation Level: Oriented X4 Attention: Sustained Sustained Attention: Impaired Sustained Attention Impairment: Verbal basic Memory: Impaired Memory Impairment: Retrieval deficit, Decreased short term memory, Storage deficit Decreased Short Term Memory: Verbal basic Awareness: Appears intact Problem Solving: Appears intact (for basic info) Safety/Judgment: Appears intact Cognition Arousal/Alertness: Awake/alert Behavior During Therapy: WFL for tasks assessed/performed Overall Cognitive Status: Impaired/Different from baseline Area of Impairment: Memory, Following commands, Problem solving Memory: Decreased short-term memory Following Commands: Follows multi-step commands inconsistently Problem Solving: Requires verbal cues, Difficulty sequencing General Comments: Pt with decreased short-term memory and requires verbal cues for sequencing multi-step tasks.  Physical Exam: Blood pressure (!) 168/87, pulse 67, temperature 97.7 F (36.5 C), temperature source Oral, resp. rate 20, height '5\' 6"'  (1.676 m), weight 99.8 kg (220 lb), SpO2 100 %. Physical Exam  Vitals reviewed. Constitutional: No distress.  HENT:  Head: Normocephalic and atraumatic.  Eyes: EOM are normal.  Neck: Normal range of motion. Neck supple. No tracheal deviation present. No thyromegaly present.  Cardiovascular: Normal rate, regular rhythm and normal heart sounds.  Exam reveals no friction rub.   No murmur heard. Respiratory: Effort normal and breath sounds normal. No respiratory distress. He has no wheezes.  GI: Soft. Bowel sounds are normal. He exhibits no distension. There is no tenderness.  Musculoskeletal: He  exhibits no edema.  Skin: He is not diaphoretic.  Psychiatric: He has a normal mood and affect. His behavior is normal.  Skin. warm and dry Neurological: He is alert and oriented to person, place, and time. He exhibits normal muscle tone.  Blind--sees light /dark only. EOMI grossly intat.  Follows basic commands.Fair awareness and insight. Right central 7. Speech slightly dysarthric. RUE 4 to 4+/5 deltoid, biceps, triceps, wrist, hand. LUE 5/5. RLE 4-/5 HF, 4/5 KE and ADF/PF. LLE 4+/5. Decreased LT RUE and RLE (1+/2) as well as decreased Golden on right. No PD.    Results for orders placed or performed during the hospital encounter of 03/13/16 (from the past 48 hour(s))  Glucose, capillary     Status: Abnormal   Collection Time: 03/14/16  8:53 AM  Result Value Ref Range   Glucose-Capillary 169 (H) 65 - 99 mg/dL  Glucose, capillary     Status: Abnormal   Collection Time: 03/14/16 12:04 PM  Result Value Ref Range   Glucose-Capillary 116 (H) 65 - 99 mg/dL  Glucose, capillary     Status: Abnormal   Collection Time: 03/14/16  4:30 PM  Result Value Ref Range   Glucose-Capillary 132 (H) 65 - 99 mg/dL  Glucose, capillary     Status: Abnormal   Collection Time: 03/14/16  8:43 PM  Result Value Ref Range   Glucose-Capillary 126 (H) 65 - 99 mg/dL   Comment 1 Notify RN    Comment  2 Document in Chart   CBC     Status: None   Collection Time: 03/15/16  2:04 AM  Result Value Ref Range   WBC 8.3 4.0 - 10.5 K/uL   RBC 4.82 4.22 - 5.81 MIL/uL   Hemoglobin 13.0 13.0 - 17.0 g/dL   HCT 39.8 39.0 - 52.0 %   MCV 82.6 78.0 - 100.0 fL   MCH 27.0 26.0 - 34.0 pg   MCHC 32.7 30.0 - 36.0 g/dL   RDW 13.5 11.5 - 15.5 %   Platelets 189 150 - 400 K/uL  Basic metabolic panel     Status: Abnormal   Collection Time: 03/15/16  2:04 AM  Result Value Ref Range   Sodium 139 135 - 145 mmol/L   Potassium 4.3 3.5 - 5.1 mmol/L   Chloride 107 101 - 111 mmol/L   CO2 23 22 - 32 mmol/L   Glucose, Bld 67 65 - 99 mg/dL    BUN 18 6 - 20 mg/dL   Creatinine, Ser 2.04 (H) 0.61 - 1.24 mg/dL   Calcium 9.1 8.9 - 10.3 mg/dL   GFR calc non Af Amer 30 (L) >60 mL/min   GFR calc Af Amer 35 (L) >60 mL/min    Comment: (NOTE) The eGFR has been calculated using the CKD EPI equation. This calculation has not been validated in all clinical situations. eGFR's persistently <60 mL/min signify possible Chronic Kidney Disease.    Anion gap 9 5 - 15  Glucose, capillary     Status: Abnormal   Collection Time: 03/15/16  7:00 AM  Result Value Ref Range   Glucose-Capillary 61 (L) 65 - 99 mg/dL   Comment 1 Notify RN    Comment 2 Document in Chart   Glucose, capillary     Status: Abnormal   Collection Time: 03/15/16  8:00 AM  Result Value Ref Range   Glucose-Capillary 122 (H) 65 - 99 mg/dL   Comment 1 Notify RN    Comment 2 Document in Chart   Glucose, capillary     Status: Abnormal   Collection Time: 03/15/16 11:11 AM  Result Value Ref Range   Glucose-Capillary 141 (H) 65 - 99 mg/dL   Comment 1 Notify RN    Comment 2 Document in Chart   Glucose, capillary     Status: None   Collection Time: 03/15/16  4:29 PM  Result Value Ref Range   Glucose-Capillary 76 65 - 99 mg/dL   Comment 1 Notify RN    Comment 2 Document in Chart   Glucose, capillary     Status: Abnormal   Collection Time: 03/15/16  9:37 PM  Result Value Ref Range   Glucose-Capillary 160 (H) 65 - 99 mg/dL   Comment 1 Notify RN    Comment 2 Document in Chart   Glucose, capillary     Status: Abnormal   Collection Time: 03/16/16  6:21 AM  Result Value Ref Range   Glucose-Capillary 111 (H) 65 - 99 mg/dL   Comment 1 Notify RN    Comment 2 Document in Chart    No results found.     Medical Problem List and Plan: 1.  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  DVT Prophylaxis/Anticoagulation: SCDs. Monitor for any signs of DVT 3. Pain Management: Tylenol as needed 4. Mood: Provide emotional  support. Continue Prozac 20 mg daily. 5. Neuropsych: This patient is capable of making decisions on his own behalf. 6.  Skin/Wound Care: Routine skin checks 7. Fluids/Electrolytes/Nutrition: Routine I&O with follow-up chemistries to be performed  -encourage adequate po intake  -education regarding appropriate food choices 8. Legally blind. Patient used a cane prior to admission was able to navigate about his apartment 9. Hypertension. Cozaar 50 mg daily, Toprol-XL 75 mg daily. Monitor with increased mobility 10. Diabetes mellitus peripheral neuropathy. Hemoglobin A1c 7.2. Trandjenta 5 mg daily. Check blood sugars before meals and at bedtime. Diabetic teaching appropriate. 11. CAD. Continue aspirin therapy. No chest pain or shortness of breath 12.BPH.Proscar 37m daily.Check PVR 3 13. Hyperlipidemia. Pravachol 20 mg daily 14. Constipation.linzess 290 mcg daily,Miralax daily  Post Admission Physician Evaluation: 1. Functional deficits secondary  to left parietal infarct. 2. Patient is admitted to receive collaborative, interdisciplinary care between the physiatrist, rehab nursing staff, and therapy team. 3. Patient's level of medical complexity and substantial therapy needs in context of that medical necessity cannot be provided at a lesser intensity of care such as a SNF. 4. Patient has experienced substantial functional loss from his/her baseline which was documented above under the "Functional History" and "Functional Status" headings.  Judging by the patient's diagnosis, physical exam, and functional history, the patient has potential for functional progress which will result in measurable gains while on inpatient rehab.  These gains will be of substantial and practical use upon discharge  in facilitating mobility and self-care at the household level. 5. Physiatrist will provide 24 hour management of medical needs as well as oversight of the therapy plan/treatment and provide guidance as  appropriate regarding the interaction of the two. 6. The Preadmission Screening has been reviewed and patient status is unchanged unless otherwise stated above. 7. 24 hour rehab nursing will assist with bladder management, bowel management, safety, skin/wound care, disease management, medication administration, pain management and patient education  and help integrate therapy concepts, techniques,education, etc. 8. PT will assess and treat for/with: Lower extremity strength, range of motion, stamina, balance, functional mobility, safety, adaptive techniques and equipment, NMR, visual-spatial awareness, .   Goals are: mod I to supervision. 9. OT will assess and treat for/with: ADL's, functional mobility, safety, upper extremity strength, adaptive techniques and equipment, visual-spatial awareness, stroke education, family ed.   Goals are: mod I to set up. Therapy may proceed with showering this patient. 10. SLP will assess and treat for/with: cognition, speech, communication.  Goals are: mod I. 11. Case Management and Social Worker will assess and treat for psychological issues and discharge planning. 12. Team conference will be held weekly to assess progress toward goals and to determine barriers to discharge. 13. Patient will receive at least 3 hours of therapy per day at least 5 days per week. 14. ELOS: 7-11 days       15. Prognosis:  excellent     ZMeredith Staggers MD, FFountainPhysical Medicine & Rehabilitation 03/16/2016  ACathlyn Parsons, PA-C 03/16/2016

## 2016-03-16 NOTE — Progress Notes (Signed)
Pt admitted to unit at 1600. RN reviewed rehab process and schedule with pt. Pt legally blind unable to sign safety papers but verbally understands safety plan. Bed alarm inplace, call bell within reach with nurse call button evident with guaze and tape.

## 2016-03-16 NOTE — PMR Pre-admission (Signed)
PMR Admission Coordinator Pre-Admission Assessment  Patient: Caleb Brisendine Sr. is an 76 y.o., male MRN: [id number] DOB: [birth year] Height: '5\' 6"'  (167.6 cm) Weight: 99.8 kg (220 lb)              Insurance Information HMO: Yes    PPO:       PCP:       IPA:       80/20:       OTHER:   PRIMARY: [employer]      Policy#: [id number]      Subscriber: Caleb Dunn CM Name:  Randal Buba      Phone#: [phone]     Fax#: [phone] Pre-Cert#: [id number] from 03/16/16 to 03/22/16 with update due 03/22/16$0      Employer: Retired Benefits:  Phone #: [phone]     Name:  Jorge Ny. 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 Pt legally blind  History of Present Illness: A 76 y.o.right handed malewith history significant legally blind, CAD maintained on aspirin, type 2 diabetes mellitus and peripheral neuropathy. Per chart review patient lives alone uses a cane in [facility]. His daughters and family members assist with finances appointments and meals. He presented 03/13/2016 with dizziness, altered mental status and unsteady gait. Cranial CT scan negative. MRI showed restricted diffusion in the left parietal lobe consistent with acute infarct. Small area of subarachnoid hemorrhage in the left parietal region felt to be possibly acute. Patient did not receive TPA.  Echocardiogram with ejection fraction of 53% grade 1 diastolic dysfunction. Carotid Dopplers with no ICA stenosis. Neurology consulted presently on aspirin for CVA prophylaxis. No plan for TEE or loop recorder. Tolerating a regular consistency diet. Physical and occupational therapy evaluation completed with recommendations of physical medicine rehabilitation consult. Patient to be admitted for comprehensive inpatient rehabilitation program.   Total: 3=NIH  Past Medical History  Past Medical History:  Diagnosis Date  . Blindness   . CAD (coronary artery disease)   . Cholelithiasis   . CTS (carpal tunnel syndrome)    Left  . DM type 2 with diabetic peripheral neuropathy ([place])   . ED (erectile dysfunction)   . Elevated PSA   . GERD (gastroesophageal reflux disease)   . Glaucoma   . HTN (hypertension)   . 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 MI (myocardial infarction) 1991  . Thyroid nodule   . Type II or unspecified type diabetes mellitus without mention of complication, not stated as uncontrolled     Family History  family history includes Hypertension in his father and mother; Kidney disease in his father.  Prior Rehab/Hospitalizations: Had HHPT about 5-6 months ago after a fall  Has the patient had major surgery  during 100 days prior to admission? No  Current Medications   Current Facility-Administered Medications:  .  aspirin EC tablet 325 mg, 325 mg, Oral, Daily, Donzetta Starch, NP, 325 mg at 03/16/16 1022 .  finasteride (PROSCAR) tablet 5 mg, 5 mg, Oral, Daily, Reubin Milan, MD, 5 mg at 03/16/16 1022 .  FLUoxetine (PROZAC) capsule 20 mg, 20 mg, Oral, Daily, Reubin Milan, MD, 20 mg at 03/16/16 1022 .  insulin aspart (novoLOG) injection 0-9 Units, 0-9 Units, Subcutaneous, TID WC, Reubin Milan, MD, 1 Units at 03/15/16 1833 .  labetalol (NORMODYNE,TRANDATE) injection 20 mg, 20 mg, Intravenous, Q10 min PRN, Donzetta Starch, NP .  linaclotide  Rolan Lipa) capsule 290 mcg, 290 mcg, Oral, Daily, Reubin Milan, MD, 290 mcg at 03/16/16 1022 .  linagliptin (TRADJENTA) tablet 5 mg, 5 mg, Oral, Daily, Reubin Milan, MD, 5 mg at 03/16/16 1022 .  losartan (COZAAR) tablet 50 mg, 50 mg, Oral, Daily, Reubin Milan, MD, 50 mg at 03/16/16 1022 .  metoprolol succinate (TOPROL-XL) 24 hr tablet 75 mg, 75 mg, Oral, Daily, Bonnielee Haff, MD, 75 mg at 03/16/16 1022 .  pantoprazole (PROTONIX) EC tablet 40 mg, 40 mg, Oral, Daily, Reubin Milan, MD, 40 mg at 03/16/16 1022 .  polyethylene glycol (MIRALAX / GLYCOLAX) packet 17 g, 17 g, Oral, Daily, Reubin Milan, MD, 17 g at 03/16/16 1020 .  pravastatin (PRAVACHOL) tablet 20 mg, 20 mg, Oral, q1800, Reubin Milan, MD, 20 mg at 03/15/16 1833 .  senna-docusate (Senokot-S) tablet 1 tablet, 1 tablet, Oral, QHS PRN, Reubin Milan, MD .  sodium chloride flush (NS) 0.9 % injection 3 mL, 3 mL, Intravenous, Q12H, Reubin Milan, MD, 3 mL at 03/15/16 2100 .  triamcinolone ointment (KENALOG) 0.5 % 1 application, 1 application, Topical, BID, Reubin Milan, MD, 1 application at 37/48/27 1021  Patients Current Diet: Diet heart healthy/carb modified Room service appropriate? Yes; Fluid consistency: Thin  Precautions / Restrictions Precautions Precautions: Fall Precaution Comments: pt is blind, reliance on assistive device Restrictions Weight Bearing Restrictions: No   Has the patient had 2 or more falls or a fall with injury in the past year?Yes.  Has suffered 2 falls, one resulting in an ED visit per patient  Prior Activity Level Community (5-7x/wk): Went out 2-3 X a week.  Home Assistive Devices / Equipment Home Assistive Devices/Equipment: Cane (specify quad or straight) Home Equipment: Cane - single point  Prior Device Use: Indicate devices/aids used by the patient prior to current illness, exacerbation or injury? Cane sometimes  Prior Functional Level Prior  Function Level of Independence: Needs assistance Gait / Transfers Assistance Needed: Utilizing cane ADL's / Homemaking Assistance Needed: Daughters assist with financial management, cooking, cleaning, and providing set-up for ADL.  Self Care: Did the patient need help bathing, dressing, using the toilet or eating?  Independent.  Sometimes daughter assists with set up.  Indoor Mobility: Did the patient need assistance with walking from room to room (with or without device)? Independent  Stairs: Did the patient need assistance with internal or external stairs (with or without device)? Independent  Functional Cognition: Did the patient need help planning regular tasks such as shopping or remembering to take medications? Independent  Current Functional Level Cognition  Arousal/Alertness: Awake/alert Overall Cognitive Status: Impaired/Different from baseline Orientation Level: Oriented X4 Following Commands: Follows multi-step commands inconsistently General Comments: Pt with decreased short-term memory and requires verbal cues for sequencing multi-step tasks. Attention: Sustained  Sustained Attention: Impaired Sustained Attention Impairment: Verbal basic Memory: Impaired Memory Impairment: Retrieval deficit, Decreased short term memory, Storage deficit Decreased Short Term Memory: Verbal basic Awareness: Appears intact Problem Solving: Appears intact (for basic info) Safety/Judgment: Appears intact    Extremity Assessment (includes Sensation/Coordination)  Upper Extremity Assessment: Defer to OT evaluation RUE Deficits / Details: Slightly decreased strength in RUE (grossly 4+/5) as compared to L. RUE Coordination:  (Reports overshooting/undershooting but not noted on eval.)  Lower Extremity Assessment: Overall WFL for tasks assessed (R LE with hip flexor weakness)    ADLs  Overall ADL's : Needs assistance/impaired Eating/Feeding: Set up, Sitting Grooming: Set up, Sitting Upper  Body Bathing: Set up, Sitting Lower Body Bathing: Minimal assistance, Sit to/from stand Upper Body Dressing : Set up, Sitting Lower Body Dressing: Minimal assistance, Sit to/from stand Toilet Transfer: Minimal assistance, BSC, Ambulation, RW Toilet Transfer Details (indicate cue type and reason): Min assist to maintain balance with RW. Toileting- Clothing Manipulation and Hygiene: Minimal assistance, Sit to/from stand Functional mobility during ADLs: Minimal assistance, Rolling walker General ADL Comments: Limited by visual deficits and unfamiliar environment. Improved R UE coordination noted during ADL tasks with     Mobility  Overal bed mobility: Needs Assistance Bed Mobility: Sit to Supine Supine to sit: Min assist Sit to supine: Supervision General bed mobility comments: OOB in chair on OT arrival.    Transfers  Overall transfer level: Needs assistance Equipment used: Rolling walker (2 wheeled) Transfers: Sit to/from Stand Sit to Stand: Min guard General transfer comment: min guard from recliner chair    Ambulation / Gait / Stairs / Wheelchair Mobility  Ambulation/Gait Ambulation/Gait assistance: Museum/gallery curator (Feet): 150 Feet Assistive device: Straight cane, 1 person hand held assist Gait Pattern/deviations: Step-through pattern, Wide base of support General Gait Details: mild incoordination with placement of cane Rt hand, left HHA, mod cues for path finding in unfamiliar environment Gait velocity: slower Gait velocity interpretation: Below normal speed for age/gender    Posture / Balance Dynamic Sitting Balance Sitting balance - Comments: Able to don/doff socks sitting at EOB without back support. Balance Overall balance assessment: Needs assistance Sitting-balance support: No upper extremity supported, Feet supported Sitting balance-Leahy Scale: Good Sitting balance - Comments: Able to don/doff socks sitting at EOB without back support. Standing  balance support: Single extremity supported, Bilateral upper extremity supported, During functional activity Standing balance-Leahy Scale: Fair Standing balance comment: Able to statically stand without UE support but requires B UE support for functional mobility with min assist from therapist.    Special needs/care consideration BiPAP/CPAP No CPM No Continuous Drip IV No Dialysis No         Life Vest No Oxygen No Special Bed No Trach Size No Wound Vac (area) No      Skin No                             Bowel mgmt:Last documented BM 03/14/16, but patient reports BM 03/15/16  Bladder mgmt: Condom catheter for incontinence Diabetic mgmt Yes, on oral medication at home.    Previous Home Environment Living Arrangements: Alone  Lives With: Alone Available Help at Discharge: Family, Available PRN/intermittently (daughters) Type of Home: Apartment Home Layout: One level Home Access: Level entry Bathroom Shower/Tub: Chiropodist: [place]: No  Discharge Living Setting Plans for Discharge Living Setting: Alone, Apartment ([facility]) Type of Home at Discharge: [place] Discharge  Home Layout: One level Discharge Home Access: Level entry Does the patient have any problems obtaining your medications?: No  Social/Family/Support Systems Patient Roles: Parent (Has 6 children.  They work various shifts but can assist.) Contact Information: Rance Muir - daughter - [phone] Anticipated Caregiver: children Ability/Limitations of Caregiver: Children work various shifts but can assist. Caregiver Availability: Intermittent Discharge Plan Discussed with Primary Caregiver: Yes Is Caregiver In Agreement with Plan?: Yes Does Caregiver/Family have Issues with Lodging/Transportation while Pt is in Rehab?: No  Goals/Additional Needs Patient/Family Goal for Rehab: PT/OT/SLP mod I and supervision goals Expected length of stay: 9-13  days Cultural Considerations: [employer] Dietary Needs: Heart Healthy, carb mod, thin liquids Equipment Needs: TBD Pt/Family Agrees to Admission and willing to participate: Yes Program Orientation Provided & Reviewed with Pt/Caregiver Including Roles  & Responsibilities: Yes  Decrease burden of Care through IP rehab admission: N/A  Possible need for SNF placement upon discharge: Not anticipated  Patient Condition: This patient's condition remains as documented in the consult dated 03/15/16, in which the Rehabilitation Physician determined and documented that the patient's condition is appropriate for intensive rehabilitative care in an inpatient rehabilitation facility. Will admit to inpatient rehab today.  Preadmission Screen Completed By:  Retta Diones, 03/16/2016 11:45 AM ______________________________________________________________________   Discussed status with Dr. Naaman Plummer on 03/16/16 at 81 and received telephone approval for admission today.  Admission Coordinator:  Retta Diones, time1200/Date2/23/18

## 2016-03-17 ENCOUNTER — Inpatient Hospital Stay (HOSPITAL_COMMUNITY): Payer: Medicare HMO | Admitting: Speech Pathology

## 2016-03-17 ENCOUNTER — Inpatient Hospital Stay (HOSPITAL_COMMUNITY): Payer: Medicare HMO | Admitting: Occupational Therapy

## 2016-03-17 ENCOUNTER — Inpatient Hospital Stay (HOSPITAL_COMMUNITY): Payer: Medicare HMO | Admitting: Physical Therapy

## 2016-03-17 NOTE — Progress Notes (Signed)
Occupational Therapy Assessment and Plan  Patient Details  Name: Caleb Cordts Sr. MRN: [id number] Date of Birth: 06-24-1940  OT Diagnosis: abnormal posture, hemiplegia affecting dominant side and muscle weakness (generalized) Rehab Potential: Rehab Potential (ACUTE ONLY): Excellent ELOS: 7-10 days   Today's Date: 03/17/2016 OT Individual Time: 9924-2683 OT Individual Time Calculation (min): 75 min     Problem List:  Patient Active Problem List   Diagnosis Date Noted  . Parietal lobe infarction ([place]) 03/16/2016  . Acute cerebrovascular accident (CVA) ([place]) 03/13/2016  . Essential hypertension 03/13/2016  . Pruritus 01/03/2016  . UTI (urinary tract infection) 10/27/2015  . Impacted cerumen of left ear 10/03/2015  . Sore throat in the morning 09/23/2015  . Situational mixed anxiety and depressive disorder 06/17/2015  . Neuropathic pain 03/09/2015  . Insomnia 03/09/2015  . Left arm weakness 08/13/2014  . Herpes zoster 07/13/2014  . Constipation 07/13/2014  . Epididymitis 02/23/2014  . Pain in joint, ankle and foot 08/04/2013  . Dry mouth 05/05/2013  . 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 Dizziness 11/01/2011  . Obesity (BMI 30-39.9)   . Chronic kidney disease (CKD), stage IV (severe) ([place])   . Elevated PSA 08/17/2011  . Bladder neck obstruction 04/13/2011  . DM (diabetes mellitus), type 2 with ophthalmic complications ([place])   . Glaucoma associated with ocular disorder   . Carpal tunnel sundrome   . Erectile dysfunction   . GERD   . Cervical disc disorder with radiculopathy   . Hyperlipidemia   . Cholelithiases   . Hypertensive heart disease   . Coronary atherosclerosis     Past Medical History:  Past Medical History:  Diagnosis Date  . Blindness   . CAD (coronary artery disease)   . Cholelithiasis   . CTS (carpal tunnel syndrome)    Left  . DM type 2 with diabetic peripheral neuropathy ([place])   . ED (erectile dysfunction)   . Elevated PSA   . GERD  (gastroesophageal reflux disease)   . Glaucoma   . HTN (hypertension)   . 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 MI (myocardial infarction) 1991  . Thyroid nodule   . Type II or unspecified type diabetes mellitus without mention of complication, not stated as uncontrolled    Past Surgical History:  Past Surgical History:  Procedure Laterality Date  . CHOLECYSTECTOMY      Assessment & Plan Clinical Impression: Patient is a 76 y.o. year old male with recent admission to the hospital on 03/13/2016 with dizziness, altered mental status and unsteady gait. Cranial CT scan negative. MRI showed restricted diffusion in the left parietal lobe consistent with acute infarct. Small area of subarachnoid hemorrhage in the left parietal region felt to be possibly acute. Patient transferred to CIR on 03/16/2016 .    Patient currently requires min with basic self-care skills secondary to muscle weakness, decreased coordination, low vision and decreased sitting balance, decreased standing balance, hemiplegia and decreased balance strategies.  Prior to hospitalization, patient could complete basic IADL with modified independent .  Patient will benefit from skilled intervention to increase independence with basic self-care skills prior to discharge home with care partner.  Anticipate patient will require intermittent supervision and assistance for higher level iADL tasks and follow up home health.  OT - End of Session Endurance Deficit: Yes Endurance Deficit Description: Pt required rest breas between ADL OT Assessment Rehab Potential (ACUTE ONLY): Excellent OT Patient demonstrates impairments in the following area(s): Balance;Endurance;Motor;Vision OT Basic ADL's Functional Problem(s):  Eating;Grooming;Bathing;Dressing;Toileting OT Advanced ADL's Functional Problem(s): Simple Meal Preparation OT Transfers Functional Problem(s): Toilet;Tub/Shower OT Additional Impairment(s): Fuctional Use of Upper  Extremity OT Plan OT Intensity: Minimum of 1-2 x/day, 45 to 90 minutes OT Frequency: 5 out of 7 days OT Duration/Estimated Length of Stay: 7-10 days OT Treatment/Interventions: Balance/vestibular training;Cognitive remediation/compensation;Community reintegration;Discharge planning;DME/adaptive equipment instruction;Functional mobility training;Neuromuscular re-education;Patient/family education;Self Care/advanced ADL retraining;Therapeutic Activities;Therapeutic Exercise;UE/LE Strength taining/ROM;UE/LE Coordination activities;Visual/perceptual remediation/compensation OT Self Feeding Anticipated Outcome(s): Mod I OT Basic Self-Care Anticipated Outcome(s): Mod I OT Toileting Anticipated Outcome(s): Mod I OT Bathroom Transfers Anticipated Outcome(s): Supervision OT Recommendation Patient destination: Home Follow Up Recommendations: Home health OT Equipment Recommended: To be determined   Skilled Therapeutic Intervention Initial eval completed with treatment provided to address functional transfers, standing tolerance, and adapted bathing/dressing skills. Pt is legally blind at baseline, able to see some light/dark/shadows. Pt seated EOB finishing breakfast upon OT arrival. Min A stand step turn transfer from EOB to wc with verbal cues for orientation and proximity of wc 2/2 low vision. Stand-step turn transfer to toilet with min A and cues to locate grab bars and toilet seat. Pt then ambulated 5 feet with min  HHA to tub shower, Bathing completed with overall min A and set-up. UB/LB dressing with overall min A, assist to orient clothing, obtain items, and maintain standing balance. Discussed OT goals and established plan of care.   OT Evaluation Precautions/Restrictions  Precautions Precautions: Fall Precaution Comments: pt is blind, reliance on assistive device Restrictions Weight Bearing Restrictions: No  Pain Pain Assessment Pain Assessment: 0-10 Pain Score: 8  Pain Type: Chronic  pain Pain Location: Back  repositioned Home Living/Prior Functioning Home Living Available Help at Discharge: Family, Available PRN/intermittently (pt states he can arrange for his children to provide 24/7 supervision initially at d/c) Type of Home: Apartment Home Access: Level entry Home Layout: One level Bathroom Shower/Tub: Optometrist: Yes  Lives With: Alone IADL History Current License: No Education: 9th grade Occupation: Retired (pt was a Theme park manager) IADL Comments: Pt's children assist with meals, grocery shopping, finances Prior Function Level of Independence: Requires assistive device for independence Meal Prep: Minimal Driving: No Vocation: Retired Leisure: Hobbies-yes (Comment) Comments: Enjoys spending time with family and listening to the TV ADL ADL ADL Comments: Please see functional navigator Vision/Perception  Vision- History Baseline Vision/History: Legally blind Patient Visual Report: No change from baseline (per pt report) Vision- Assessment Additional Comments: blind at baseline Perception Comments: WFL Praxis Praxis-Other Comments: WFL  Cognition Overall Cognitive Status: Impaired/Different from baseline Arousal/Alertness: Awake/alert Orientation Level: Place;Person;Situation Person: Oriented Place: Oriented Situation: Oriented Year: 2018 Month: February Day of Week: Correct Memory: Impaired Memory Impairment: Retrieval deficit;Decreased short term memory;Storage deficit Decreased Short Term Memory: Verbal complex;Functional complex Immediate Memory Recall: Blue;Sock;Bed Memory Recall: Blue Memory Recall Blue: Without Cue Attention: Sustained Sustained Attention: Appears intact Sustained Attention Impairment: Verbal basic;Functional basic Awareness:  (states "everything seems to be alright.") Problem Solving: Appears intact Safety/Judgment: Appears intact Sensation Sensation Light Touch:  Appears Intact (chronic tingling in BLEs below kness "like they're kinda tired.") Coordination Gross Motor Movements are Fluid and Coordinated: No Fine Motor Movements are Fluid and Coordinated: No (R fine motor deficits noted) Motor  Motor Motor: Abnormal postural alignment and control Motor - Skilled Clinical Observations: L lean with gait Mobility  Bed Mobility Bed Mobility: Supine to Sit Supine to Sit: 5: Supervision;With rails;HOB flat Transfers Sit to Stand: 4: Min guard Stand to Sit: 4: Min guard  Trunk/Postural  Assessment  Cervical Assessment Cervical Assessment: Within Functional Limits Thoracic Assessment Thoracic Assessment: Within Functional Limits Lumbar Assessment Lumbar Assessment: Within Functional Limits Postural Control Postural Control: Deficits on evaluation Righting Reactions: delayed Protective Responses: delayed  Balance Balance Balance Assessed: Yes Dynamic Sitting Balance Dynamic Sitting - Balance Support: Feet supported;No upper extremity supported Dynamic Sitting - Level of Assistance: 5: Stand by assistance Sitting balance - Comments: Able to don/doff socks sitting at EOB without back support. Static Standing Balance Static Standing - Balance Support: Right upper extremity supported;Left upper extremity supported;During functional activity Static Standing - Level of Assistance: 5: Stand by assistance Dynamic Standing Balance Dynamic Standing - Balance Support: During functional activity Dynamic Standing - Level of Assistance: 4: Min assist Extremity/Trunk Assessment RUE Assessment RUE Assessment: Exceptions to [facility] RUE Strength RUE Overall Strength: Deficits RUE Overall Strength Comments: 4-/5 overall LUE Assessment LUE Assessment: Within Functional Limits   See Function Navigator for Current Functional Status.   Refer to Care Plan for Long Term Goals  Recommendations for other services: None    Discharge Criteria: Patient will be  discharged from OT if patient refuses treatment 3 consecutive times without medical reason, if treatment goals not met, if there is a change in medical status, if patient makes no progress towards goals or if patient is discharged from hospital.  The above assessment, treatment plan, treatment alternatives and goals were discussed and mutually agreed upon: by patient  Valma Cava 03/17/2016, 12:50 PM

## 2016-03-17 NOTE — Progress Notes (Signed)
Subjective: Patient feels well. No complaints. This will be his first day of therapy.  Objective: BP (!) 142/63 (BP Location: Left Arm)   Pulse 71   Temp 98.2 F (36.8 C) (Oral)   Resp 18   SpO2 99%   Elderly male in no acute distress. HEENT exam atraumatic, normocephalic symmetric muscles are intact Neck is supple Chest clear to auscultation without increased work of breathing Cardiac exam S1 and S2 are regular Abdominal exam active bowel sounds, soft Extremities no edema  A/p Medical Problem List and Plan: 1. 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 DVT Prophylaxis/Anticoagulation: SCDs. Monitor for any signs of DVT 3. Pain Management: Tylenol as needed 4. Mood: Provide emotional support. Continue Prozac 20 mg daily. 5. Neuropsych: This patient iscapable of making decisions on hisown behalf. 6. Skin/Wound Care: Routine skin checks 7. Fluids/Electrolytes/Nutrition: Routine I&O  Basic Metabolic Panel:    Component Value Date/Time   NA 139 03/15/2016 0204   K 4.3 03/15/2016 0204   CL 107 03/15/2016 0204   CO2 23 03/15/2016 0204   BUN 18 03/15/2016 0204   CREATININE 2.04 (H) 03/15/2016 0204   GLUCOSE 67 03/15/2016 0204   CALCIUM 9.1 03/15/2016 0204    8.Legally blind. Patient used a cane prior to admission was able to navigate about his apartment 9.Hypertension. Cozaar 50 mg daily, Toprol-XL 75 mg daily. Monitor with increased mobility 130/54-157/71  (may need to increase med) 10.Diabetes mellitus peripheral neuropathy. Hemoglobin A1c 7.2. Trandjenta 5 mg daily. Check blood sugars before meals and at bedtime. Diabetic teaching appropriate. 11.CAD. Continue aspirin therapy. No chest pain or shortness of breath 12.BPH.Proscar 5mg  daily.CheckPVR 3 13.Hyperlipidemia. Pravachol 20 mg daily 14.Constipation.linzess 290 mcg daily,Miralax daily

## 2016-03-17 NOTE — Evaluation (Signed)
Speech Language Pathology Assessment and Plan  Patient Details  Name: Caleb Jorgensen Sr. MRN: [id number] Date of Birth: 08-27-1940  Evaluation only  Today's Date: 03/17/2016 SLP Individual Time: 1000-1100 SLP Individual Time Calculation (min): 60 min   Problem List:  Patient Active Problem List   Diagnosis Date Noted  . Parietal lobe infarction ([place]) 03/16/2016  . Acute cerebrovascular accident (CVA) ([place]) 03/13/2016  . Essential hypertension 03/13/2016  . Pruritus 01/03/2016  . UTI (urinary tract infection) 10/27/2015  . Impacted cerumen of left ear 10/03/2015  . Sore throat in the morning 09/23/2015  . Situational mixed anxiety and depressive disorder 06/17/2015  . Neuropathic pain 03/09/2015  . Insomnia 03/09/2015  . Left arm weakness 08/13/2014  . Herpes zoster 07/13/2014  . Constipation 07/13/2014  . Epididymitis 02/23/2014  . Pain in joint, ankle and foot 08/04/2013  . Dry mouth 05/05/2013  . 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 Dizziness 11/01/2011  . Obesity (BMI 30-39.9)   . Chronic kidney disease (CKD), stage IV (severe) ([place])   . Elevated PSA 08/17/2011  . Bladder neck obstruction 04/13/2011  . DM (diabetes mellitus), type 2 with ophthalmic complications ([place])   . Glaucoma associated with ocular disorder   . Carpal tunnel sundrome   . Erectile dysfunction   . GERD   . Cervical disc disorder with radiculopathy   . Hyperlipidemia   . Cholelithiases   . Hypertensive heart disease   . Coronary atherosclerosis    Past Medical History:  Past Medical History:  Diagnosis Date  . Blindness   . CAD (coronary artery disease)   . Cholelithiasis   . CTS (carpal tunnel syndrome)    Left  . DM type 2 with diabetic peripheral neuropathy ([place])   . ED (erectile dysfunction)   . Elevated PSA   . GERD (gastroesophageal reflux disease)   . Glaucoma   . HTN (hypertension)   . Legal blindness due to diabetes mellitus ([place])   . MI (myocardial infarction) 1991  .  Thyroid nodule   . Type II or unspecified type diabetes mellitus without mention of complication, not stated as uncontrolled    Past Surgical History:  Past Surgical History:  Procedure Laterality Date  . CHOLECYSTECTOMY      Assessment / Plan / Recommendation Clinical Impression Caleb Dunnis a 76 y.o.right handed malewith history significant legally blind, CAD maintained on aspirin, type 2 diabetes mellitus and peripheral neuropathy. Per chart review patient lives alone [facility] cane in [facility] living [facility]. His daughters and family members assist with finances appointments and meals. He presented 03/13/2016 with dizziness, altered mental status and unsteady gait. Cranial CT scan negative. MRI showed restricted diffusion in the left parietal lobe consistent with acute infarct. Small area of subarachnoid hemorrhage in the left parietal region felt to be possibly acute. Patient did not receive TPA. Echocardiogram with ejection fraction of 46% grade 1 diastolic dysfunction. Carotid Dopplers with no ICA stenosis. Neurology consulted presently on aspirin for CVA prophylaxis. No plan for TEE or loop recorder. Tolerating a regular consistency diet. Physical and occupational therapy evaluation completed with recommendations of physical medicine rehabilitation consult.Patient was admitted for comprehensive rehabilitation program.   Pt admited to CIR on 03/16/16 with speech-language evaluation completed on 03/17/16. Pt is legally blind and lived by himself previous to recent hospitalization. Pt informs that he now feels it is best ot live with one of his children at discharge and he doesn't feel he can live by himself safely. Prior  to admission, pt received assistance from daughters and family members for money management, appointments, meals and medication. Pt was alert, oriented, able to follow auditory directions and produce intelligible speech. Pt obtained score of 14 out of  22 on MOCA Blind with (n=>18). Pt reports that he completed the 8th or 9th grade. Pt exhibited moderate memory impairments. He states that he has been experiencing difficulty with his memory for the last 3 to 4 months. Pt also experienced difficulty with mental math which would all be considered baseline deficits. His current cognitive abilities appear at baseline given limited activity and level of assistance that famly was providing prior to this hospitalization. Pt plans to discharge home with one of his children. Skilled ST doesn't appear indicated at this time.     Skilled Therapeutic Interventions          Skilled treatment session focused on completion of speech-language evaluation, see above, Results shared with pt, discharge plan discussed. Pt is agreeable to living with one of his children for increased safety given history of memory and visual deficits.    SLP Assessment  Patient does not need any further Speech Lanaguage Pathology Services    Recommendations  Patient destination: Home Follow up Recommendations: None Equipment Recommended: None recommended by SLP           Pain    Prior Functioning Cognitive/Linguistic Baseline: Information not available Type of Home: Apartment  Lives With: Alone Available Help at Discharge: Family;Available PRN/intermittently Education: 9th grade Vocation: Retired  Function:  Eating Eating   Modified Consistency Diet: No Eating Assist Level: Set up assist for;Supervision or verbal cues   Eating Set Up Assist For: Opening containers;Cutting food       Cognition Comprehension Comprehension assist level: Follows basic conversation/direction with no assist  Expression   Expression assist level: Expresses basic needs/ideas: With no assist  Social Interaction Social Interaction assist level: Interacts appropriately with others - No medications needed.  Problem Solving Problem solving assist level: Solves basic problems with no assist   Memory Memory assist level:  (d/t visual deficits - pt states taht if he could see people it would help him remember them better)   Short Term Goals: No short term goals set  Refer to Care Plan for Long Term Goals  Recommendations for other services: None   Discharge Criteria: Patient will be discharged from SLP if patient refuses treatment 3 consecutive times without medical reason, if treatment goals not met, if there is a change in medical status, if patient makes no progress towards goals or if patient is discharged from hospital.  The above assessment, treatment plan, treatment alternatives and goals were discussed and mutually agreed upon: by patient  Satine Hausner B. [place]ford Nail, M.S., CCC-SLP Speech-Language Pathologist  Magic Mohler 03/17/2016, 10:49 AM

## 2016-03-17 NOTE — Evaluation (Signed)
Physical Therapy Assessment and Plan  Patient Details  Name: Caleb Dome Sr. MRN: [id number] Date of Birth: [birth year]  PT Diagnosis: Abnormality of gait, Coordination disorder, Hemiparesis, and Muscle weakness Rehab Potential: Good ELOS: 7-10 days   Today's Date: 03/17/2016 PT Individual Time: 1100-1200 PT Individual Time Calculation (min): 60 min    Problem List:  Patient Active Problem List   Diagnosis Date Noted  . Parietal lobe infarction ([place]) 03/16/2016  . Acute cerebrovascular accident (CVA) ([place]) 03/13/2016  . Essential hypertension 03/13/2016  . Pruritus 01/03/2016  . UTI (urinary tract infection) 10/27/2015  . Impacted cerumen of left ear 10/03/2015  . Sore throat in the morning 09/23/2015  . Situational mixed anxiety and depressive disorder 06/17/2015  . Neuropathic pain 03/09/2015  . Insomnia 03/09/2015  . Left arm weakness 08/13/2014  . Herpes zoster 07/13/2014  . Constipation 07/13/2014  . Epididymitis 02/23/2014  . Pain in joint, ankle and foot 08/04/2013  . Dry mouth 05/05/2013  . 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 Dizziness 11/01/2011  . Obesity (BMI 30-39.9)   . Chronic kidney disease (CKD), stage IV (severe) ([place])   . Elevated PSA 08/17/2011  . Bladder neck obstruction 04/13/2011  . DM (diabetes mellitus), type 2 with ophthalmic complications ([place])   . Glaucoma associated with ocular disorder   . Carpal tunnel sundrome   . Erectile dysfunction   . GERD   . Cervical disc disorder with radiculopathy   . Hyperlipidemia   . Cholelithiases   . Hypertensive heart disease   . Coronary atherosclerosis     Past Medical History:  Past Medical History:  Diagnosis Date  . Blindness   . CAD (coronary artery disease)   . Cholelithiasis   . CTS (carpal tunnel syndrome)    Left  . DM type 2 with diabetic peripheral neuropathy ([place])   . ED (erectile dysfunction)   . Elevated PSA   . GERD (gastroesophageal reflux disease)   . Glaucoma   . HTN  (hypertension)   . Legal blindness due to diabetes mellitus ([place])   . MI (myocardial infarction) 1991  . Thyroid nodule   . Type II or unspecified type diabetes mellitus without mention of complication, not stated as uncontrolled    Past Surgical History:  Past Surgical History:  Procedure Laterality Date  . CHOLECYSTECTOMY      Assessment & Plan Clinical Impression: A 76 y.o. right handed male with history significant legally blind, CAD maintained on aspirin, type 2 diabetes mellitus and peripheral neuropathy. Per chart review patient lives alone uses a cane in a independent living facility [facility]. His daughters and family members assist with finances appointments and meals. He presented 03/13/2016 with dizziness, altered mental status and unsteady gait. Cranial CT scan negative. MRI showed restricted diffusion in the left parietal lobe consistent with acute infarct. Small area of subarachnoid hemorrhage in the left parietal region felt to be possibly acute. Patient did not receive TPA. Echocardiogram with ejection fraction of 19% grade 1 diastolic dysfunction. Carotid Dopplers with no ICA stenosis. Neurology consulted presently on aspirin for CVA prophylaxis. No plan for TEE or loop recorder. Tolerating a regular consistency diet. Physical and occupational therapy evaluation completed with recommendations of physical medicine rehabilitation consult. Patient to be admitted for comprehensive inpatient rehabilitation program.  Patient transferred to CIR on 03/16/2016 .   Patient currently requires min with mobility secondary to muscle weakness, decreased coordination, decreased midline orientation and decreased postural control and decreased balance strategies.  Prior to hospitalization,  patient was modified independent  with mobility and lived with Alone in a [place] home.  Home access is  Level entry.  Patient will benefit from skilled PT intervention to maximize safe functional  mobility, minimize fall risk and decrease caregiver burden for planned discharge home with 24 hour supervision.  Anticipate patient will benefit from follow up [place] at discharge.  PT - End of Session Activity Tolerance: Tolerates 30+ min activity with multiple rests Endurance Deficit: Yes PT Assessment Rehab Potential (ACUTE/IP ONLY): Good Barriers to Discharge: Decreased caregiver support PT Patient demonstrates impairments in the following area(s): Balance;Safety;Endurance;Motor;Pain;Perception PT Transfers Functional Problem(s): Bed Mobility;Bed to Chair;Car;Furniture;Floor PT Locomotion Functional Problem(s): Stairs;Wheelchair Mobility;Ambulation PT Plan PT Intensity: Minimum of 1-2 x/day ,45 to 90 minutes PT Frequency: 5 out of 7 days PT Duration Estimated Length of Stay: 7-10 days PT Treatment/Interventions: Ambulation/gait training;Community reintegration;DME/adaptive equipment instruction;Neuromuscular re-education;Psychosocial support;Stair training;UE/LE Strength taining/ROM;UE/LE Coordination activities;Therapeutic Activities;Balance/vestibular training;Discharge planning;Cognitive remediation/compensation;Functional mobility training;Patient/family education;Splinting/orthotics;Therapeutic Exercise;Visual/perceptual remediation/compensation PT Transfers Anticipated Outcome(s): mod I PT Locomotion Anticipated Outcome(s): mod I for transfers, supervision for ambulation in unfamiliar environment PT Recommendation Recommendations for Other Services: Neuropsych consult Follow Up Recommendations: Home health PT;24 hour supervision/assistance (initial 24/7 supervision) Patient destination: Home Equipment Recommended: Rolling walker with 5" wheels  Skilled Therapeutic Intervention Pt c/o 8/10 back pain that is chronic, Rn notified.  Session focus on initial PT assessment of mobility, strength, and coordination, patient education, as well as transfers, strengthening, and gait training.     Pt transfers throughout session with close supervision and occasional min tactile cues 2/2 visual deficits.  Gait training with [place] with min assist to maintain midline orientation and for multimodal cues for navigating in unfamiliar environment.  Gait training with RW and steady assist to supervision with cues for walker positioning and assist for navigating environment.  Pt negotiates 4 steps with 2 rails and close supervision.  Car transfer with min assist for LLE.  PT provided pt education regarding ELOS and anticipated PT goals and pt verbalized understanding.  Returned to room at end of session and positioned upright in w/c with call bell in reach and needs met.   PT Evaluation Precautions/Restrictions Precautions Precautions: Fall Precaution Comments: pt is blind, reliance on assistive device Pain Pain Assessment Pain Assessment: 0-10 Pain Score: 8  Pain Type: Chronic pain Pain Location: Back Home Living/Prior Functioning Home Living Available Help at Discharge: Family;Available PRN/intermittently (pt states he can arrange for his children to provide 24/7 supervision initially at d/c) Type of Home: Apartment Home Access: Level entry Home Layout: One level Bathroom Shower/Tub: Chiropodist: Standard Bathroom Accessibility: Yes  Lives With: Alone Prior Function Level of Independence: Requires assistive device for independence Meal Prep: Minimal Driving: No Vocation: Retired Leisure: Hobbies-yes (Comment) Comments: Enjoys spending time with family and listening to the TV Vision/Perception  Vision - Assessment Additional Comments: blind at baseline Perception Comments: appears [facility] Praxis Praxis-Other Comments: appears WFL, to be further tested throughout LOS  Cognition Overall Cognitive Status: Impaired/Different from baseline Arousal/Alertness: Awake/alert Orientation Level: Oriented X4 Attention: Sustained Sustained Attention: Appears  intact Sustained Attention Impairment: Verbal basic;Functional basic Memory:  (Pt reports that he has been experiencing memory difficulties for several months) Memory Impairment: Retrieval deficit;Decreased short term memory;Storage deficit Decreased Short Term Memory: Verbal complex;Functional complex Awareness:  (states "everything seems to be alright.") Problem Solving: Appears intact Safety/Judgment: Appears intact Sensation Sensation Light Touch: Appears Intact (chronic tingling in BLEs below kness "like they're kinda tired.") Coordination Gross Motor Movements are  Fluid and Coordinated: No Fine Motor Movements are Fluid and Coordinated: No Motor  Motor Motor: Abnormal postural alignment and control Motor - Skilled Clinical Observations: L lean with gait  Mobility Bed Mobility Bed Mobility: Supine to Sit Supine to Sit: 5: Supervision;With rails;HOB flat Transfers Transfers: Yes Sit to Stand: 4: Min guard Stand to Sit: 4: Min guard Locomotion  Ambulation Ambulation: Yes Ambulation/Gait Assistance: 3: Mod assist Ambulation Distance (Feet): 150 Feet Assistive device: Straight cane Ambulation/Gait Assistance Details: Tactile cues for weight shifting;Verbal cues for precautions/safety Ambulation/Gait Assistance Details: progressive L lateral lean with gait and occasional stumbling Wheelchair Mobility Wheelchair Mobility: No  Trunk/Postural Assessment  Cervical Assessment Cervical Assessment: Within Functional Limits Thoracic Assessment Thoracic Assessment: Within Functional Limits Lumbar Assessment Lumbar Assessment: Within Functional Limits Postural Control Postural Control: Deficits on evaluation Righting Reactions: delayed Protective Responses: delayed  Balance Balance Balance Assessed: Yes Dynamic Sitting Balance Dynamic Sitting - Balance Support: Feet supported;No upper extremity supported Dynamic Sitting - Level of Assistance: 5: Stand by assistance Static  Standing Balance Static Standing - Balance Support: Right upper extremity supported;Left upper extremity supported;During functional activity Static Standing - Level of Assistance: 5: Stand by assistance Dynamic Standing Balance Dynamic Standing - Balance Support: Right upper extremity supported;Left upper extremity supported;During functional activity Dynamic Standing - Level of Assistance: 4: Min assist Extremity Assessment      RLE Assessment RLE Assessment: Within Functional Limits LLE Assessment LLE Assessment: Within Functional Limits   See Function Navigator for Current Functional Status.   Refer to Care Plan for Long Term Goals  Recommendations for other services: Neuropsych  Discharge Criteria: Patient will be discharged from PT if patient refuses treatment 3 consecutive times without medical reason, if treatment goals not met, if there is a change in medical status, if patient makes no progress towards goals or if patient is discharged from hospital.  The above assessment, treatment plan, treatment alternatives and goals were discussed and mutually agreed upon: by patient  Earnest Conroy Penven-Crew 03/17/2016, 12:17 PM

## 2016-03-18 ENCOUNTER — Inpatient Hospital Stay (HOSPITAL_COMMUNITY): Payer: Medicare HMO

## 2016-03-18 MED ORDER — LOSARTAN POTASSIUM 50 MG PO TABS
100.0000 mg | ORAL_TABLET | Freq: Every day | ORAL | Status: DC
  Administered 2016-03-18 – 2016-03-20 (×3): 100 mg via ORAL
  Filled 2016-03-18 (×3): qty 2

## 2016-03-18 NOTE — Progress Notes (Signed)
Occupational Therapy Session Note  Patient Details  Name: Caleb Satterly Sr. MRN: [id number] Date of Birth: [birth year]  Today's Date: 03/18/2016 OT Individual Time: 1115-1200 OT Individual Time Calculation (min): 45 min   Short Term Goals: Week 1:  OT Short Term Goal 1 (Week 1): LTG=STG 2/2 estimated LOS  Skilled Therapeutic Interventions/Progress Updates: ADL-retraining at sink with focus on improved awareness, standing balance, and activity tolerance.   Pt received supine in bed, alert and receptive for BADL however reporting no clean clothing.   OT recovered pt's original pants and clean new underwear from room supplies and pt accepted challenge to perform BADL.   Pt required min vc and setup to rise to sit at EOB, tactile cues to perform SP ttransfer to w/c and setup to wash and dress at sink.   Pt deferred change of brief or washing periarea and buttocks due to new clean brief provided in early am care by RN tech.   Pt stood to groom without evidence of balance deficits but required setup due to visual impairment.   Pt performed all BADL desired within 30 min and requested supervision with functional mobility using RW as appropriate exercise.   Pt ambulated 30' from room using RW with contact guard and vc for turns d/t visual impairment.   Pt recovered to w/c at end of room with family present Caleb Dunn and Caleb Dunn).   Overall pt required min contact guard and setup to perform self-care and he endorses generalized weakness as only deficit.     Therapy Documentation Precautions:  Precautions Precautions: Fall Precaution Comments: pt is blind, reliance on assistive device Restrictions Weight Bearing Restrictions: No    Pain: No/denies pain  See Function Navigator for Current Functional Status.   Therapy/Group: Individual Therapy  [place] 03/18/2016, 12:59 PM

## 2016-03-18 NOTE — Progress Notes (Signed)
Subjective: No complaints this morning. Patient is easily awakened. He felt therapy went well yesterday.  Objective: BP (!) 171/63 (BP Location: Left Arm)   Pulse 70   Temp 97.7 F (36.5 C) (Oral)   Resp 18   SpO2 98%   Elderly male in no acute distress. HEENT exam atraumatic, normocephalic symmetric muscles are intact Neck is supple Chest clear to auscultation without increased work of breathing Cardiac exam S1 and S2 are regular Abdominal exam active bowel sounds, soft Extremities no edema  A/p Medical Problem List and Plan: 1. 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 DVT Prophylaxis/Anticoagulation: SCDs. Monitor for any signs of DVT 3. Pain Management: Tylenol as needed 4. Mood: Provide emotional support. Continue Prozac 20 mg daily. 5. Neuropsych: This patient iscapable of making decisions on hisown behalf. 6. Skin/Wound Care: Routine skin checks 7. Fluids/Electrolytes/Nutrition: Routine I&O  Basic Metabolic Panel:    Component Value Date/Time   NA 139 03/15/2016 0204   K 4.3 03/15/2016 0204   CL 107 03/15/2016 0204   CO2 23 03/15/2016 0204   BUN 18 03/15/2016 0204   CREATININE 2.04 (H) 03/15/2016 0204   GLUCOSE 67 03/15/2016 0204   CALCIUM 9.1 03/15/2016 0204    8.Legally blind. Patient used a cane prior to admission was able to navigate about his apartment 9.Hypertension. , Toprol-XL 75 mg daily. Monitor with increased mobility 131/67-171/63 increase losartan to 100mg  po qd on 2/25 10.Diabetes mellitus peripheral neuropathy. Hemoglobin A1c 7.2. Trandjenta 5 mg daily. Check blood sugars before meals and at bedtime. Diabetic teaching appropriate. 11.CAD. Continue aspirin therapy. No chest pain or shortness of breath 12.BPH.Proscar 5mg  daily.CheckPVR 3 13.Hyperlipidemia. Pravachol 20 mg daily 14.Constipation.linzess 290 mcg daily,Miralax daily

## 2016-03-19 ENCOUNTER — Inpatient Hospital Stay (HOSPITAL_COMMUNITY): Payer: Medicare HMO | Admitting: Occupational Therapy

## 2016-03-19 ENCOUNTER — Inpatient Hospital Stay (HOSPITAL_COMMUNITY): Payer: Medicare HMO | Admitting: Speech Pathology

## 2016-03-19 ENCOUNTER — Inpatient Hospital Stay (HOSPITAL_COMMUNITY): Payer: Medicare HMO | Admitting: Physical Therapy

## 2016-03-19 DIAGNOSIS — N183 Chronic kidney disease, stage 3 unspecified: Secondary | ICD-10-CM

## 2016-03-19 DIAGNOSIS — I10 Essential (primary) hypertension: Secondary | ICD-10-CM

## 2016-03-19 DIAGNOSIS — H547 Unspecified visual loss: Secondary | ICD-10-CM

## 2016-03-19 LAB — COMPREHENSIVE METABOLIC PANEL
ALK PHOS: 66 U/L (ref 38–126)
ALT: 22 U/L (ref 17–63)
AST: 17 U/L (ref 15–41)
Albumin: 3.3 g/dL — ABNORMAL LOW (ref 3.5–5.0)
Anion gap: 6 (ref 5–15)
BUN: 30 mg/dL — AB (ref 6–20)
CALCIUM: 9.1 mg/dL (ref 8.9–10.3)
CHLORIDE: 108 mmol/L (ref 101–111)
CO2: 24 mmol/L (ref 22–32)
CREATININE: 2.22 mg/dL — AB (ref 0.61–1.24)
GFR, EST AFRICAN AMERICAN: 31 mL/min — AB (ref >60)
GFR, EST NON AFRICAN AMERICAN: 27 mL/min — AB (ref >60)
Glucose, Bld: 112 mg/dL — ABNORMAL HIGH (ref 65–99)
Potassium: 4.3 mmol/L (ref 3.5–5.1)
Sodium: 138 mmol/L (ref 135–145)
Total Bilirubin: 0.7 mg/dL (ref 0.3–1.2)
Total Protein: 7.5 g/dL (ref 6.5–8.1)

## 2016-03-19 LAB — GLUCOSE, CAPILLARY
GLUCOSE-CAPILLARY: 81 mg/dL (ref 65–99)
GLUCOSE-CAPILLARY: 147 mg/dL — AB (ref 65–99)
GLUCOSE-CAPILLARY: 128 mg/dL — AB (ref 65–99)
GLUCOSE-CAPILLARY: 116 mg/dL — AB (ref 65–99)
GLUCOSE-CAPILLARY: 124 mg/dL — AB (ref 65–99)
GLUCOSE-CAPILLARY: 129 mg/dL — AB (ref 65–99)
GLUCOSE-CAPILLARY: 110 mg/dL — AB (ref 65–99)
Glucose-Capillary: 99 mg/dL (ref 65–99)
Glucose-Capillary: 141 mg/dL — ABNORMAL HIGH (ref 65–99)
Glucose-Capillary: 102 mg/dL — ABNORMAL HIGH (ref 65–99)
Glucose-Capillary: 160 mg/dL — ABNORMAL HIGH (ref 65–99)
Glucose-Capillary: 140 mg/dL — ABNORMAL HIGH (ref 65–99)
Glucose-Capillary: 114 mg/dL — ABNORMAL HIGH (ref 65–99)

## 2016-03-19 LAB — CBC WITH DIFFERENTIAL/PLATELET
BASOS ABS: 0 10*3/uL (ref 0.0–0.1)
Basophils Relative: 0 %
Eosinophils Absolute: 0.2 10*3/uL (ref 0.0–0.7)
Eosinophils Relative: 3 %
HCT: 39.8 % (ref 39.0–52.0)
Hemoglobin: 13 g/dL (ref 13.0–17.0)
LYMPHS ABS: 2.9 10*3/uL (ref 0.7–4.0)
LYMPHS PCT: 36 %
MCH: 27.1 pg (ref 26.0–34.0)
MCHC: 32.7 g/dL (ref 30.0–36.0)
MCV: 82.9 fL (ref 78.0–100.0)
Monocytes Absolute: 0.6 10*3/uL (ref 0.1–1.0)
Monocytes Relative: 8 %
NEUTROS ABS: 4.2 10*3/uL (ref 1.7–7.7)
NEUTROS PCT: 53 %
Platelets: 196 10*3/uL (ref 150–400)
RBC: 4.8 MIL/uL (ref 4.22–5.81)
RDW: 13.6 % (ref 11.5–15.5)
WBC: 7.9 10*3/uL (ref 4.0–10.5)

## 2016-03-19 NOTE — Progress Notes (Signed)
Occupational Therapy Session Note  Patient Details  Name: Caleb Rolfe Sr. MRN: [id number] Date of Birth: [birth year]  Today's Date: 03/19/2016 OT Individual Time: 8466-5993 and 5701-7793 OT Individual Time Calculation (min): 59 min and 31 min  Short Term Goals: Week 1:  OT Short Term Goal 1 (Week 1): LTG=STG 2/2 estimated LOS  Skilled Therapeutic Interventions/Progress Updates: Pt was sitting in recliner at time of arrival, agreeable to complete ADLs. Tx focus on ADL retraining, R UE NMR, activity tolerance, and standing balance. Pt ambulated into bathroom with RW and Min A, directional cues provided due to visual deficits. Bathing completed with overall min A, standing for pericare PRN. Pt required assist for retrieving bathing items (due to vision) and cues for integrating R UE. Afterwards dressing was completed in chair at sink with supervision. Bedmaking task then completed with pt ambulating around bed without AD and Min A, focus on dynamic balance and R UE coordination. Afterwards pt returned to recliner and was left with OT handoff.      2nd Session 1:1 tx (31 min) Pt was sitting in recliner at time of arrival, agreeable to session. Tx focus on low vision strategies and R UE NMR. Pt engaged in stereognosis bean box activity with emphasis on improving R UE tactile discrimination. For problem solving, had pt also use auditory and olfactory senses when appropriate. Pt then participated in pillowcase folding task with focus on R UE coordination. 1 rest break provided due to fatigue. At end of session pt was left with all needs within reach.   Therapy Documentation Precautions:  Precautions Precautions: Fall Precaution Comments: pt is blind, reliance on assistive device Restrictions Weight Bearing Restrictions: No General:   Vital Signs: Therapy Vitals Pulse Rate: 61 BP: (!) 129/57 Pain: No c/o pain during sessions  Pain Assessment Pain Assessment: No/denies pain ADL: ADL ADL  Comments: Please see functional navigator    See Function Navigator for Current Functional Status.   Therapy/Group: Individual Therapy  Thornton Dohrmann A Fordyce Lepak 03/19/2016, 12:24 PM

## 2016-03-19 NOTE — Progress Notes (Addendum)
[facility] PHYSICAL MEDICINE & REHABILITATION     PROGRESS NOTE  Subjective/Complaints:  Pt seen laying in bed this AM.  He states he slept well overnight and had a good weekend.   ROS: Denies CP, SOB, N/V/D.  Objective: Vital Signs: Blood pressure (!) 129/57, pulse 61, temperature 97.7 F (36.5 C), temperature source Oral, resp. rate 18, SpO2 100 %. No results found.  Recent Labs  03/19/16 0721  WBC 7.9  HGB 13.0  HCT 39.8  PLT 196    Recent Labs  03/19/16 0721  NA 138  K 4.3  CL 108  GLUCOSE 112*  BUN 30*  CREATININE 2.22*  CALCIUM 9.1   CBG (last 3)   Recent Labs  03/18/16 1630 03/18/16 2200 03/19/16 0641  GLUCAP 102* 160* 110*    Wt Readings from Last 3 Encounters:  03/13/16 99.8 kg (220 lb)  01/03/16 91.6 kg (202 lb)  12/12/15 91.2 kg (201 lb)    Physical Exam:  BP (!) 129/57   Pulse 61   Temp 97.7 F (36.5 C) (Oral)   Resp 18   SpO2 100%  Constitutional: No distress. Vital signs reviewed. Well-developed.  HENT: Normocephalicand atraumatic.  Eyes: Blind  Cardiovascular: RRR. No JVD. Respiratory: Effort normaland breath sounds normal.  GI: Soft. Bowel sounds are normal.  Musculoskeletal: He exhibits no edema. No tenderness.  Neurological: He is alertand oriented x3.  Follows commands. Fair awareness and insight. Right central 7.  Motor: RUE 4+-5/5 deltoid, biceps, triceps, wrist, hand. LUE 5/5.  RLE 4+-5/5 HF, 4/5 KE and ADF/PF.  LLE 5/5.  Skin: Warm and dry. Intact.  Psychiatric: He has a normal mood and affect. His behavior is normal.    Assessment/Plan: 1. 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 Physiatrist is providing close team supervision and 24 hour management of active medical problems listed below. Physiatrist and rehab team continue to assess barriers to discharge/monitor patient progress toward  functional and medical goals.  Function:  Bathing Bathing position   Position: Wheelchair/chair at sink  Bathing parts Body parts bathed by patient: Right arm, Left arm, Chest, Abdomen Body parts bathed by helper: Back  Bathing assist Assist Level: Touching or steadying assistance(Pt > 75%)      Upper Body Dressing/Undressing Upper body dressing   What is the patient wearing?: Pull over shirt/dress     Pull over shirt/dress - Perfomed by patient: Thread/unthread left sleeve, Put head through opening, Pull shirt over trunk Pull over shirt/dress - Perfomed by helper: Thread/unthread right sleeve        Upper body assist Assist Level: Touching or steadying assistance(Pt > 75%)      Lower Body Dressing/Undressing Lower body dressing   What is the patient wearing?: Pants, Non-skid slipper socks     Pants- Performed by patient: Thread/unthread right pants leg, Thread/unthread left pants leg, Pull pants up/down, Fasten/unfasten pants Pants- Performed by helper: Thread/unthread right pants leg Non-skid slipper socks- Performed by patient: Don/doff right sock, Don/doff left sock   Socks - Performed by patient: Don/doff left sock, Don/doff right sock   Shoes - Performed by patient: Don/doff left shoe, Don/doff right shoe            Lower body assist Assist for lower body dressing: Touching or steadying assistance (Pt > 75%)      Toileting Toileting Toileting activity did not occur: No continent bowel/bladder event  Toileting assist     Transfers Chair/bed transfer   Chair/bed transfer method: Stand pivot Chair/bed transfer assist level: Touching or steadying assistance (Pt > 75%) Chair/bed transfer assistive device: Armrests, Medical sales representative     Max distance: 150 Assist level: Touching or steadying assistance (Pt > 75%)   Wheelchair Wheelchair activity did not occur: N/A        Cognition Comprehension Comprehension assist level:  Follows basic conversation/direction with extra time/assistive device  Expression Expression assist level: Expresses basic needs/ideas: With extra time/assistive device  Social Interaction Social Interaction assist level: Interacts appropriately with others - No medications needed.  Problem Solving Problem solving assist level: Solves basic 90% of the time/requires cueing < 10% of the time  Memory Memory assist level: Requires cues to use assistive device    Medical Problem List and Plan: 1. Right hemiparesisand balance deficitssecondary to left parietal infarct  Cont CIR  Notes reviewed, images reviewed, see above 2. DVT Prophylaxis/Anticoagulation: SCDs. Monitor for any signs of DVT 3. Pain Management: Tylenol as needed 4. Mood: Provide emotional support. Continue Prozac 20 mg daily. 5. Neuropsych: This patient iscapable of making decisions on hisown behalf. 6. Skin/Wound Care: Routine skin checks 7. Fluids/Electrolytes/Nutrition: Routine I&Os  education regarding appropriate food choices 8.Legally blind. Patient used a cane prior to admission was able to navigate about his apartment 9.Hypertension. Cozaar 50 mg daily, Toprol-XL 75 mg daily.   Labile at presents, monitor with increased mobility 10.Diabetes mellitus peripheral neuropathy. Hemoglobin A1c 7.2. Trandjenta 5 mg daily. Check blood sugars before meals and at bedtime. Diabetic teaching appropriate.  Overall controlled 2/26 11.CAD. Continue aspirin therapy. No chest pain or shortness of breath 12.BPH.Proscar 5mg  daily.   PVR 3 pending 13.Hyperlipidemia. Pravachol 20 mg daily 14.Constipation.linzess 290 mcg daily, Miralax daily 15. CKD III  Cr 2.22 on 2/26  Cont to monitor  Encourage adequate po intake   LOS (Days) 3 A FACE TO FACE EVALUATION WAS PERFORMED  Caleb Dunn [place] 03/19/2016 10:05 AM

## 2016-03-19 NOTE — Care Management Note (Signed)
[facility] Individual Statement of Services  Patient Name:  Caleb Hamza Sr.  Date:  03/19/2016  Welcome to the [facility].  Our goal is to provide you with an individualized program based on your diagnosis and situation, designed to meet your specific needs.  With this comprehensive rehabilitation program, you will be expected to participate in at least 3 hours of rehabilitation therapies Monday-Friday, with modified therapy programming on the weekends.  Your rehabilitation program will include the following services:  Physical Therapy (PT), Occupational Therapy (OT), Speech Therapy (ST), 24 hour per day rehabilitation nursing, Therapeutic Recreaction (TR), Neuropsychology, Case Management (Social Worker), Rehabilitation Medicine, Nutrition Services and Pharmacy Services  Weekly team conferences will be held on Wednesdays to discuss your progress.  Your Social Worker will talk with you frequently to get your input and to update you on team discussions.  Team conferences with you and your family in attendance may also be held.  Expected length of stay: 7-10 days  Overall anticipated outcome: modified independent  Depending on your progress and recovery, your program may change. Your Social Worker will coordinate services and will keep you informed of any changes. Your Social Worker's name and contact numbers are listed  below.  The following services may also be recommended but are not provided by the [facility] will be made to provide these services after discharge if needed.  Arrangements include referral to agencies that provide these services.  Your insurance has been verified to be:  [employer] Your primary doctor is:  Dr. Alain Marion  Pertinent information will be shared with your doctor and your  insurance company.  Social Worker:  [place], [place] or ([phone]   Information discussed with and copy given to patient by: Lennart Pall, 03/19/2016, 4:20 PM

## 2016-03-19 NOTE — Progress Notes (Signed)
Social Work  Social Work Assessment and Plan  Patient Details  Name: Chartered loss adjuster Sr. MRN: [id number] Date of Birth: 1940-04-28  Today's Date: 03/19/2016  Problem List:  Patient Active Problem List   Diagnosis Date Noted  . Stage 3 chronic kidney disease   . Benign essential HTN   . Blind   . Parietal lobe infarction ([place]) 03/16/2016  . Acute cerebrovascular accident (CVA) ([place]) 03/13/2016  . Essential hypertension 03/13/2016  . Pruritus 01/03/2016  . UTI (urinary tract infection) 10/27/2015  . Impacted cerumen of left ear 10/03/2015  . Sore throat in the morning 09/23/2015  . Situational mixed anxiety and depressive disorder 06/17/2015  . Neuropathic pain 03/09/2015  . Insomnia 03/09/2015  . Left arm weakness 08/13/2014  . Herpes zoster 07/13/2014  . Constipation 07/13/2014  . Epididymitis 02/23/2014  . Pain in joint, ankle and foot 08/04/2013  . Dry mouth 05/05/2013  . 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 Dizziness 11/01/2011  . Obesity (BMI 30-39.9)   . Chronic kidney disease (CKD), stage IV (severe) ([place])   . Elevated PSA 08/17/2011  . Bladder neck obstruction 04/13/2011  . DM (diabetes mellitus), type 2 with ophthalmic complications ([place])   . Glaucoma associated with ocular disorder   . Carpal tunnel sundrome   . Erectile dysfunction   . GERD   . Cervical disc disorder with radiculopathy   . Hyperlipidemia   . Cholelithiases   . Hypertensive heart disease   . Coronary atherosclerosis    Past Medical History:  Past Medical History:  Diagnosis Date  . Blindness   . CAD (coronary artery disease)   . Cholelithiasis   . CTS (carpal tunnel syndrome)    Left  . DM type 2 with diabetic peripheral neuropathy ([place])   . ED (erectile dysfunction)   . Elevated PSA   . GERD (gastroesophageal reflux disease)   . Glaucoma   . HTN (hypertension)   . Legal blindness due to diabetes mellitus ([place])   . MI (myocardial infarction) 1991  . Thyroid nodule   . Type II  or unspecified type diabetes mellitus without mention of complication, not stated as uncontrolled    Past Surgical History:  Past Surgical History:  Procedure Laterality Date  . CHOLECYSTECTOMY     Social History:  reports that he has quit smoking. He has never used smokeless tobacco. He reports that he does not drink alcohol or use drugs.  Family / Support Systems Marital Status: Widow/Widower How Long?: 5 yrs Patient Roles: Parent Children: daughter, Rance Muir @ [phone];  son, Shandell Jallow @ [phone]; plus 4 more adult children with all living locally except one in [place]. Other Supports: sister, Link Snuffer @ [phone] Anticipated Caregiver: children Ability/Limitations of Caregiver: Children work various shifts but can assist. Caregiver Availability: Intermittent Family Dynamics: Pt describes family as very supportive and "usually see one of them about every day."  Social History Preferred language: English Religion: Non-Denominational Cultural Background: NA Read: Yes Write: Yes Employment Status: Retired Freight forwarder Issues: None Guardian/Conservator: None - per MD, pt is capable of making decisions on his own behalf.   Abuse/Neglect Physical Abuse: Denies Verbal Abuse: Denies Sexual Abuse: Denies Exploitation of patient/patient's resources: Denies Self-Neglect: Denies  Emotional Status Pt's affect, behavior adn adjustment status: Pt lying in bed and reports a long day of therapy.  Very pleasant and completes assessment interview without any difficulty.  He denies any significant emotional distress and feels he is very close to his  overall baseline functioning. Recent Psychosocial Issues: None Pyschiatric History: None Substance Abuse History: None  Patient / Family Perceptions, Expectations & Goals Pt/Family understanding of illness & functional limitations: Pt with good understanding of his CVA and current functional  limitations, however, he feels the limitations are mild.  Good understanding of CIR. Premorbid pt/family roles/activities: [place] well. Anticipated changes in roles/activities/participation: little change anticipated if he can reach mod ind/ supervision levels. Pt/family expectations/goals: Pt goal is "to just get back home."  [employer]: None Premorbid Home Care/DME Agencies: None Transportation available at discharge: yes  Veterinary surgeon Resources: Medicare ((*[employer] Medicare)) Financial Resources: Radio broadcast assistant Screen Referred: No Living Expenses: Rent Money Management: Patient, Family Does the patient have any problems obtaining your medications?: No Home Management: pt independent Patient/Family Preliminary Plans: [place] Social Work Anticipated Follow Up Needs: HH/OP Expected length of stay: 7-10 days  Clinical Impression Very pleasant gentleman here following a CVA.  [place] and was independent PTA with good family support.  He denies any significant emotional distress and feels he is, functionally, very close to his baseline.  Will follow for support and d/c planning needs.  Ariann Khaimov 03/19/2016, 4:27 PM

## 2016-03-19 NOTE — Progress Notes (Signed)
Occupational Therapy Session Note  Patient Details  Name: Caleb Rivet Sr. MRN: [id number] Date of Birth: [birth year]  Today's Date: 03/19/2016 OT Individual Time: 1100-1130 OT Individual Time Calculation (min): 30 min    Short Term Goals: Week 1:  OT Short Term Goal 1 (Week 1): LTG=STG 2/2 estimated LOS  Skilled Therapeutic Interventions/Progress Updates:    Pt transitioned easily from prior therapy session but does report fatigue. Pt seated in recliner chair for R UE fine motor coordination and strengthening tasks. Pt required min verbal and tactile cues for proper technique secondary to visual deficits. Pt engaged in individual and paired finger isolation tasks with increased difficulty with 4th and 5th digits but able to perform with increased time and concentration. OT provided pt with red, medium soft theraputty for R UE coordination and strengthening with hand over hand demonstrations and pt returning demonstrations with min cues. OT also educating pt on secondary stroke risk and signs/symptoms of CVA. Pt verbalized understanding but education to continue. Pt remained in recliner chair with call bell and all needed items within reach upon exiting the room.   Therapy Documentation Precautions:  Precautions Precautions: Fall Precaution Comments: pt is blind, reliance on assistive device Restrictions Weight Bearing Restrictions: No General:   Vital Signs: Therapy Vitals Pulse Rate: 61 BP: (!) 129/57 Pain: Pain Assessment Pain Assessment: No/denies pain ADL: ADL ADL Comments: Please see functional navigator Exercises:   Other Treatments:    See Function Navigator for Current Functional Status.   Therapy/Group: Individual Therapy  Gypsy Decant 03/19/2016, 11:44 AM

## 2016-03-19 NOTE — Progress Notes (Signed)
Patient information reviewed and entered into eRehab system by Jemmie Ledgerwood, RN, CRRN, PPS Coordinator.  Information including medical coding and functional independence measure will be reviewed and updated through discharge.     Per nursing patient was given "Data Collection Information Summary for Patients in Inpatient Rehabilitation Facilities with attached "Privacy Act Statement-Health Care Records" upon admission.  

## 2016-03-19 NOTE — Progress Notes (Signed)
[name], MD Physician Signed Physical Medicine and Rehabilitation  PMR Pre-admission Date of Service: 03/16/2016 11:45 AM  Related encounter: ED to Hosp-Admission (Discharged) from 03/13/2016 in [facility]       '[]' Hide copied text PMR Admission Coordinator Pre-Admission Assessment  Patient: Caleb Haag Sr. is an 76 y.o., male MRN: [id number] DOB: 12-13-1940 Height: '5\' 6"'  (167.6 cm) Weight: 99.8 kg (220 lb)                                                                                                                                                  Insurance Information HMO: Yes    PPO:       PCP:       IPA:       80/20:       OTHER:   PRIMARY: [employer]      Policy#: [id number]      Subscriber: Georgeanne Nim CM Name:  Randal Buba      Phone#: [phone]     Fax#: [phone] Pre-Cert#: [id number] from 03/16/16 to 03/22/16 with update due 03/22/16$0      Employer: Retired Benefits:  Phone #: [phone]     Name:  Jorge Ny. 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 Pt legally blind  History of Present Illness: A 76 y.o.right handed malewith history significant legally blind, CAD maintained on aspirin, type 2 diabetes mellitus and peripheral neuropathy. Per chart review patient lives alone uses a cane in aindependent  living facility [facility]. His daughters and family members assist with finances appointments and meals. He presented 03/13/2016 with dizziness, altered mental status and unsteady gait. Cranial CT scan negative. MRI showed restricted diffusion in the left parietal lobe consistent with acute infarct. Small area of subarachnoid hemorrhage in the left parietal region felt to be possibly acute. Patient did not receive TPA. Echocardiogram with ejection fraction of 54% grade 1 diastolic dysfunction. Carotid Dopplers with no ICA stenosis. Neurology consulted presently on aspirin for CVA prophylaxis. No plan for TEE or loop  recorder. Tolerating a regular consistency diet. Physical and occupational therapy evaluation completed with recommendations of physical medicine rehabilitation consult. Patient to be admitted for comprehensive inpatient rehabilitation program.   Total: 3=NIH  Past Medical History      Past Medical History:  Diagnosis Date  . Blindness   . CAD (coronary artery disease)   . Cholelithiasis   . CTS (carpal tunnel syndrome)    Left  . DM type 2 with diabetic peripheral neuropathy ([place])   . ED (erectile dysfunction)   . Elevated PSA   . GERD (gastroesophageal reflux disease)   . Glaucoma   . HTN (hypertension)   . 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 MI (myocardial infarction) 1991  . Thyroid nodule   . Type II or unspecified type diabetes mellitus without mention of complication, not stated as uncontrolled     Family History  family history includes Hypertension in his father and mother; Kidney disease in his father.  Prior Rehab/Hospitalizations: Had HHPT about 5-6 months ago after a fall  Has the patient had major surgery during 100 days prior to admission? No  Current Medications   Current Facility-Administered Medications:  .  aspirin EC tablet 325 mg, 325 mg, Oral, Daily, Donzetta Starch, NP, 325 mg at 03/16/16 1022 .   finasteride (PROSCAR) tablet 5 mg, 5 mg, Oral, Daily, Reubin Milan, MD, 5 mg at 03/16/16 1022 .  FLUoxetine (PROZAC) capsule 20 mg, 20 mg, Oral, Daily, Reubin Milan, MD, 20 mg at 03/16/16 1022 .  insulin aspart (novoLOG) injection 0-9 Units, 0-9 Units, Subcutaneous, TID WC, Reubin Milan, MD, 1 Units at 03/15/16 1833 .  labetalol (NORMODYNE,TRANDATE) injection 20 mg, 20 mg, Intravenous, Q10 min PRN, Donzetta Starch, NP .  linaclotide Rolan Lipa) capsule 290 mcg, 290 mcg, Oral, Daily, Reubin Milan, MD, 290 mcg at 03/16/16 1022 .  linagliptin (TRADJENTA) tablet 5 mg, 5 mg, Oral, Daily, Reubin Milan, MD, 5 mg at 03/16/16 1022 .  losartan (COZAAR) tablet 50 mg, 50 mg, Oral, Daily, Reubin Milan, MD, 50 mg at 03/16/16 1022 .  metoprolol succinate (TOPROL-XL) 24 hr tablet 75 mg, 75 mg, Oral, Daily, Bonnielee Haff, MD, 75 mg at 03/16/16 1022 .  pantoprazole (PROTONIX) EC tablet 40 mg, 40 mg, Oral, Daily, Reubin Milan, MD, 40 mg at 03/16/16 1022 .  polyethylene glycol (MIRALAX / GLYCOLAX) packet 17 g, 17 g, Oral, Daily, Reubin Milan, MD, 17 g at 03/16/16 1020 .  pravastatin (PRAVACHOL) tablet 20 mg, 20 mg, Oral, q1800, Reubin Milan, MD, 20 mg at 03/15/16 1833 .  senna-docusate (Senokot-S) tablet 1 tablet, 1 tablet, Oral, QHS PRN, Reubin Milan, MD .  sodium chloride flush (NS) 0.9 % injection 3 mL, 3 mL, Intravenous, Q12H, Reubin Milan, MD, 3 mL at 03/15/16 2100 .  triamcinolone ointment (KENALOG) 0.5 % 1 application, 1 application, Topical, BID, Reubin Milan, MD, 1 application at 24/26/83 1021  Patients Current Diet: Diet heart healthy/carb modified Room service appropriate? Yes; Fluid consistency: Thin  Precautions / Restrictions Precautions Precautions: Fall Precaution Comments: pt is blind, reliance on assistive device Restrictions Weight Bearing Restrictions: No   Has the patient had 2 or more falls or a fall with injury in the  past year?Yes.  Has suffered 2 falls, one resulting in an ED visit per patient  Prior Activity Level Community (5-7x/wk): Went out 2-3 X a week.  Home Assistive Devices / [place]  Assistive Devices/Equipment: Cane (specify quad or straight) Home Equipment: Cane - single point  Prior Device Use: Indicate devices/aids used by the patient prior to current illness, exacerbation or injury? Cane sometimes  Prior Functional Level Prior Function Level of Independence: Needs assistance Gait / Transfers Assistance Needed: Utilizing cane ADL's / Homemaking Assistance Needed: Daughters assist with financial management, cooking, cleaning, and providing set-up for ADL.  Self Care: Did the patient need help bathing, dressing, using the toilet or eating?  Independent.  Sometimes daughter assists with set up.  Indoor Mobility: Did the patient need assistance with walking from room to room (with or without device)? Independent  Stairs: Did the patient need assistance with internal or external stairs (with or without device)? Independent  Functional Cognition: Did the patient need help planning regular tasks such as shopping or remembering to take medications? Independent  Current Functional Level Cognition  Arousal/Alertness: Awake/alert Overall Cognitive Status: Impaired/Different from baseline Orientation Level: Oriented X4 Following Commands: Follows multi-step commands inconsistently General Comments: Pt with decreased short-term memory and requires verbal cues for sequencing multi-step tasks. Attention: Sustained Sustained Attention: Impaired Sustained Attention Impairment: Verbal basic Memory: Impaired Memory Impairment: Retrieval deficit, Decreased short term memory, Storage deficit Decreased Short Term Memory: Verbal basic Awareness: Appears intact Problem Solving: Appears intact (for basic info) Safety/Judgment: Appears intact    Extremity Assessment (includes  Sensation/Coordination)  Upper Extremity Assessment: Defer to OT evaluation RUE Deficits / Details: Slightly decreased strength in RUE (grossly 4+/5) as compared to L. RUE Coordination:  (Reports overshooting/undershooting but not noted on eval.)  Lower Extremity Assessment: Overall WFL for tasks assessed (R LE with hip flexor weakness)    ADLs  Overall ADL's : Needs assistance/impaired Eating/Feeding: Set up, Sitting Grooming: Set up, Sitting Upper Body Bathing: Set up, Sitting Lower Body Bathing: Minimal assistance, Sit to/from stand Upper Body Dressing : Set up, Sitting Lower Body Dressing: Minimal assistance, Sit to/from stand Toilet Transfer: Minimal assistance, BSC, Ambulation, RW Toilet Transfer Details (indicate cue type and reason): Min assist to maintain balance with RW. Toileting- Clothing Manipulation and Hygiene: Minimal assistance, Sit to/from stand Functional mobility during ADLs: Minimal assistance, Rolling walker General ADL Comments: Limited by visual deficits and unfamiliar environment. Improved R UE coordination noted during ADL tasks with     Mobility  Overal bed mobility: Needs Assistance Bed Mobility: Sit to Supine Supine to sit: Min assist Sit to supine: Supervision General bed mobility comments: OOB in chair on OT arrival.    Transfers  Overall transfer level: Needs assistance Equipment used: Rolling walker (2 wheeled) Transfers: Sit to/from Stand Sit to Stand: Min guard General transfer comment: min guard from recliner chair    Ambulation / Gait / Stairs / Wheelchair Mobility  Ambulation/Gait Ambulation/Gait assistance: Museum/gallery curator (Feet): 150 Feet Assistive device: Straight cane, 1 person hand held assist Gait Pattern/deviations: Step-through pattern, Wide base of support General Gait Details: mild incoordination with placement of cane Rt hand, left HHA, mod cues for path finding in unfamiliar environment Gait velocity:  slower Gait velocity interpretation: Below normal speed for age/gender    Posture / Balance Dynamic Sitting Balance Sitting balance - Comments: Able to don/doff socks sitting at EOB without back support. Balance Overall balance assessment: Needs assistance Sitting-balance support: No upper extremity supported, Feet supported Sitting balance-Leahy Scale: Good Sitting balance - Comments: Able to don/doff socks sitting at EOB without back support. Standing balance support: Single extremity supported, Bilateral upper extremity supported, During functional activity Standing balance-Leahy Scale:  Fair Standing balance comment: Able to statically stand without UE support but requires B UE support for functional mobility with min assist from therapist.    Special needs/care consideration BiPAP/CPAP No CPM No Continuous Drip IV No Dialysis No         Life Vest No Oxygen No Special Bed No Trach Size No Wound Vac (area) No      Skin No                             Bowel mgmt:Last documented BM 03/14/16, but patient reports BM 03/15/16  Bladder mgmt: Condom catheter for incontinence Diabetic mgmt Yes, on oral medication at home.    Previous Home Environment Living Arrangements: Alone  Lives With: Alone Available Help at Discharge: Family, Available PRN/intermittently (daughters) Type of Home: Apartment Home Layout: One level Home Access: Level entry Bathroom Shower/Tub: Chiropodist: [place]: No  Discharge Living Setting Plans for Discharge Living Setting: Alone, Apartment ([facility]) Type of Home at Discharge: Apartment Discharge Home Layout: One level Discharge Home Access: Level entry Does the patient have any problems obtaining your medications?: No  Social/Family/Support Systems Patient Roles: Parent (Has 6 children.  They work various shifts but can assist.) Contact Information: Rance Muir - daughter -  [phone] Anticipated Caregiver: children Ability/Limitations of Caregiver: Children work various shifts but can assist. Caregiver Availability: Intermittent Discharge Plan Discussed with Primary Caregiver: Yes Is Caregiver In Agreement with Plan?: Yes Does Caregiver/Family have Issues with Lodging/Transportation while Pt is in Rehab?: No  Goals/Additional Needs Patient/Family Goal for Rehab: PT/OT/SLP mod I and supervision goals Expected length of stay: 9-13 days Cultural Considerations: [employer] Dietary Needs: Heart Healthy, carb mod, thin liquids Equipment Needs: TBD Pt/Family Agrees to Admission and willing to participate: Yes Program Orientation Provided & Reviewed with Pt/Caregiver Including Roles  & Responsibilities: Yes  Decrease burden of Care through IP rehab admission: N/A  Possible need for SNF placement upon discharge: Not anticipated  Patient Condition: This patient's condition remains as documented in the consult dated 03/15/16, in which the Rehabilitation Physician determined and documented that the patient's condition is appropriate for intensive rehabilitative care in an inpatient rehabilitation facility. Will admit to inpatient rehab today.  Preadmission Screen Completed By:  Retta Diones, 03/16/2016 11:45 AM ______________________________________________________________________   Discussed status with Dr. Naaman Plummer on 03/16/16 at 22 and received telephone approval for admission today.  Admission Coordinator:  Retta Diones, time1200/Date2/23/18

## 2016-03-19 NOTE — Progress Notes (Signed)
[name], MD Physician Signed Physical Medicine and Rehabilitation  Consult Note Date of Service: 03/15/2016 9:25 AM  Related encounter: ED to Hosp-Admission (Discharged) from 03/13/2016 in [place] All Collapse All   [] Hide copied text [] Hover for attribution information      Physical Medicine and Rehabilitation Consult Reason for Consult: Left parietal lobe infarct Referring Physician: [facility]   HPI: Caleb Muscat Sr. is a 76 y.o. right handed male with history significant legally blind, CAD maintained on aspirin, type 2 diabetes mellitus and peripheral neuropathy. Per chart review patient lives alone uses a cane in a independent living facility [facility]. His daughters and family members assist with finances appointments and meals. He presented 03/13/2016 with dizziness, altered mental status and unsteady gait. Cranial CT scan negative. MRI showed restricted diffusion in the left parietal lobe consistent with acute infarct. Small area of subarachnoid hemorrhage in the left parietal region felt to be possibly acute. Patient did not receive TPA. Echocardiogram with ejection fraction of 40% grade 1 diastolic dysfunction. Carotid Dopplers with no ICA stenosis. Neurology consulted presently on aspirin for CVA prophylaxis. Tolerating a regular consistency diet. Physical therapy evaluation completed with recommendations of physical medicine rehabilitation consult.   Review of Systems  Constitutional: Negative for chills and fever.  HENT: Negative for hearing loss and tinnitus.   Eyes:       Legally blind  Respiratory: Negative for cough and shortness of breath.   Cardiovascular: Positive for leg swelling. Negative for chest pain and palpitations.  Gastrointestinal: Positive for constipation. Negative for nausea and vomiting.       GRED  Genitourinary: Positive for urgency. Negative for dysuria and hematuria.    Musculoskeletal: Positive for myalgias.       Occassional falls  Skin: Negative for rash.  Neurological: Positive for dizziness and weakness.  Psychiatric/Behavioral: Positive for depression.  All other systems reviewed and are negative.      Past Medical History:  Diagnosis Date  . Blindness   . CAD (coronary artery disease)   . Cholelithiasis   . CTS (carpal tunnel syndrome)    Left  . DM type 2 with diabetic peripheral neuropathy ([place])   . ED (erectile dysfunction)   . Elevated PSA   . GERD (gastroesophageal reflux disease)   . Glaucoma   . HTN (hypertension)   . 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 MI (myocardial infarction) 1991  . Thyroid nodule   . Type II or unspecified type diabetes mellitus without mention of complication, not stated as uncontrolled         Past Surgical History:  Procedure Laterality Date  . CHOLECYSTECTOMY           Family History  Problem Relation Age of Onset  . Hypertension Mother   . Kidney disease Father   . Hypertension Father   . Coronary artery disease      1st degree Male relative <60  . Diabetes      1st degree relative   Social History:  reports that he has quit smoking. He has never used smokeless tobacco. He reports that he does not drink alcohol or use drugs. Allergies:       Allergies  Allergen Reactions  . Benazepril Hcl Cough  . Hydroxyzine Other (See Comments)    Visual hallucinations  . Verapamil Other (See Comments)    Hallucinations  Medications Prior to Admission  Medication Sig Dispense Refill  . aspirin 81 MG tablet Take 1 tablet (81 mg total) by mouth daily. 100 tablet 3  . cholecalciferol (VITAMIN D) 1000 UNITS tablet Take 1 tablet (1,000 Units total) by mouth daily. 100 tablet 3  . Emollient (EUCERIN) lotion Apply topically as needed for dry skin. Use bid 480 mL 3  . FLUoxetine (PROZAC) 20 MG tablet Take 1 tablet (20 mg total) by mouth daily.  30 tablet 5  . hydrOXYzine (ATARAX/VISTARIL) 25 MG tablet Take 25 mg by mouth 3 (three) times daily as needed.    Marland Kitchen JANUVIA 50 MG tablet TAKE ONE TABLET BY MOUTH ONCE DAILY 30 tablet 3  . losartan (COZAAR) 50 MG tablet Take 1 tablet (50 mg total) by mouth daily. 90 tablet 2  . lovastatin (MEVACOR) 20 MG tablet Take 1 tablet (20 mg total) by mouth daily. For Cholesterol 90 tablet 3  . metoprolol succinate (TOPROL-XL) 50 MG 24 hr tablet Take 1 tablet (50 mg total) by mouth daily. For Blood Pressure 90 tablet 2  . polyethylene glycol (MIRALAX / GLYCOLAX) packet Take 17 g by mouth daily. (Patient taking differently: Take 17 g by mouth daily as needed for moderate constipation. ) 30 each 5  . Blood Glucose Monitoring Suppl (PRODIGY BLOOD GLUCOSE MONITOR) DEVI As dirrected (Patient not taking: Reported on 03/14/2016) 1 each 0  . finasteride (PROSCAR) 5 MG tablet Take 1 tablet (5 mg total) by mouth daily. For Prostate (Patient not taking: Reported on 03/14/2016) 90 tablet 3  . glimepiride (AMARYL) 2 MG tablet TAKE 1 TABLET TWICE DAILY FOR DIABETES (Patient not taking: Reported on 03/14/2016) 180 tablet 2  . glucose blood test strip 1 each by Other route 2 (two) times daily. Use to check blood sugars twice a day Dx E11.8 (Patient not taking: Reported on 03/14/2016) 300 each 3  . Incontinence Supply Disposable (DEPEND FITTED BRIEFS SM/MED) MISC Use tid prn (Patient not taking: Reported on 03/14/2016) 100 each 6  . Linaclotide (LINZESS) 290 MCG CAPS capsule Take 1 capsule (290 mcg total) by mouth daily. (Patient not taking: Reported on 03/14/2016) 30 capsule 11  . PRODIGY LANCETS 28G MISC 28 g by Other route 2 (two) times daily. Use to help check blood sugars twice a day Dx E11.8 (Patient not taking: Reported on 03/14/2016) 300 each 3  . senna-docusate (SENOKOT-S) 8.6-50 MG tablet Take 1 tablet by mouth at bedtime as needed for mild constipation. (Patient not taking: Reported on 03/14/2016) 30 tablet 1  .  triamcinolone ointment (KENALOG) 0.5 % Apply 1 application topically 2 (two) times daily. On palms (Patient not taking: Reported on 03/14/2016) 30 g 1    Home: Home Living Family/patient expects to be discharged to:: Private residence ([facility] facility) Living Arrangements: Alone Available Help at Discharge: Family, Available PRN/intermittently (daughters) Type of Home: Apartment Home Access: Level entry Home Layout: One level Bathroom Shower/Tub: Chiropodist: Standard Home Equipment: Radio producer - single point  Lives With: Alone  Functional History: Prior Function Level of Independence: Needs assistance Gait / Transfers Assistance Needed: Utilizing cane ADL's / Homemaking Assistance Needed: Daughters assist with financial management, cooking, cleaning, and providing set-up for ADL. Functional Status:  Mobility: Bed Mobility Overal bed mobility: Needs Assistance Bed Mobility: Supine to Sit Supine to sit: Min assist General bed mobility comments: OOB Transfers Overall transfer level: Needs assistance Equipment used: Straight cane Transfers: Sit to/from Stand Sit to Stand: Min guard General transfer comment:  Min guard assist for initial sit<>stand.  Then steady assist once he released the arm. Ambulation/Gait Ambulation/Gait assistance: Min assist, Mod assist Ambulation Distance (Feet): 90 Feet Assistive device: Straight cane, 1 person hand held assist Gait Pattern/deviations: Step-through pattern, Shuffle General Gait Details: consistent turning or bearing left even if pt had hold of a surface such as foot board or rail in the hall.  pt unable to easily distinguish tactile input to turn or stay straight and did not follow directional cues well. Gait velocity: slower Gait velocity interpretation: Below normal speed for age/gender  ADL: ADL Overall ADL's : Needs assistance/impaired Eating/Feeding: Set up, Sitting Grooming: Set up, Sitting Upper  Body Bathing: Set up, Sitting Lower Body Bathing: Minimal assistance, Sit to/from stand Upper Body Dressing : Set up, Sitting Lower Body Dressing: Minimal assistance, Sit to/from stand Toilet Transfer: Moderate assistance, BSC, Ambulation Toilet Transfer Details (indicate cue type and reason): Simulated with sit<>stand followed by functional mobility. Toileting- Clothing Manipulation and Hygiene: Minimal assistance, Sit to/from stand Functional mobility during ADLs: Moderate assistance, Cane General ADL Comments: Unfamiliar environment likely limiting pt's independence with ADL and functional mobility. Pt with L lateral lean during simulated toilet transfer this session. Assist for standing ADL to assist with balance.  Cognition: Cognition Overall Cognitive Status: Within Functional Limits for tasks assessed Arousal/Alertness: Awake/alert Orientation Level: Oriented X4 Attention: Sustained Sustained Attention: Impaired Sustained Attention Impairment: Verbal basic Memory: Impaired Memory Impairment: Retrieval deficit, Decreased short term memory, Storage deficit Decreased Short Term Memory: Verbal basic Awareness: Appears intact Problem Solving: Appears intact (for basic info) Safety/Judgment: Appears intact Cognition Arousal/Alertness: Awake/alert Behavior During Therapy: WFL for tasks assessed/performed Overall Cognitive Status: Within Functional Limits for tasks assessed General Comments: For tasks assessed, cognition was [facility]. However, plan to continue to assess higher level problem solving and sequencing skills.  Blood pressure (!) 187/79, pulse 71, temperature 97.6 F (36.4 C), temperature source Oral, resp. rate 18, height 5\' 6"  (1.676 m), weight 99.8 kg (220 lb), SpO2 100 %. Physical Exam  Constitutional: He is oriented to person, place, and time.  HENT:  Head: Normocephalic.  Eyes:  Pupils very sluggish to bright light.He can see some shadows  Neck: Normal range of  motion. Neck supple. No thyromegaly present.  Cardiovascular: Normal rate and regular rhythm.   Respiratory: Effort normal and breath sounds normal. No respiratory distress.  GI: Soft. Bowel sounds are normal. He exhibits no distension.  Neurological: He is alert and oriented to person, place, and time. He exhibits normal muscle tone.  Blind--sees light /dark. He extended his hand to me as I introduced myself.Follows basic commands.Fair awareness. Right central 7. RUE 4 to 4+/5. LUE 5/5. RLE 4/5. LLE 4+/5. Decreased LT RUE and RLE 1+/2 as well as decreased [place] on right. No PD.   Skin: Skin is warm and dry.  Psychiatric: He has a normal mood and affect. His behavior is normal.    Lab Results Last 24 Hours       Results for orders placed or performed during the hospital encounter of 03/13/16 (from the past 24 hour(s))  Glucose, capillary     Status: Abnormal   Collection Time: 03/14/16 12:04 PM  Result Value Ref Range   Glucose-Capillary 116 (H) 65 - 99 mg/dL  Glucose, capillary     Status: Abnormal   Collection Time: 03/14/16  4:30 PM  Result Value Ref Range   Glucose-Capillary 132 (H) 65 - 99 mg/dL  Glucose, capillary     Status: Abnormal  Collection Time: 03/14/16  8:43 PM  Result Value Ref Range   Glucose-Capillary 126 (H) 65 - 99 mg/dL   Comment 1 Notify RN    Comment 2 Document in Chart   CBC     Status: None   Collection Time: 03/15/16  2:04 AM  Result Value Ref Range   WBC 8.3 4.0 - 10.5 K/uL   RBC 4.82 4.22 - 5.81 MIL/uL   Hemoglobin 13.0 13.0 - 17.0 g/dL   HCT 39.8 39.0 - 52.0 %   MCV 82.6 78.0 - 100.0 fL   MCH 27.0 26.0 - 34.0 pg   MCHC 32.7 30.0 - 36.0 g/dL   RDW 13.5 11.5 - 15.5 %   Platelets 189 150 - 400 K/uL  Basic metabolic panel     Status: Abnormal   Collection Time: 03/15/16  2:04 AM  Result Value Ref Range   Sodium 139 135 - 145 mmol/L   Potassium 4.3 3.5 - 5.1 mmol/L   Chloride 107 101 - 111 mmol/L   CO2 23 22 - 32 mmol/L     Glucose, Bld 67 65 - 99 mg/dL   BUN 18 6 - 20 mg/dL   Creatinine, Ser 2.04 (H) 0.61 - 1.24 mg/dL   Calcium 9.1 8.9 - 10.3 mg/dL   GFR calc non Af Amer 30 (L) >60 mL/min   GFR calc Af Amer 35 (L) >60 mL/min   Anion gap 9 5 - 15  Glucose, capillary     Status: Abnormal   Collection Time: 03/15/16  7:00 AM  Result Value Ref Range   Glucose-Capillary 61 (L) 65 - 99 mg/dL   Comment 1 Notify RN    Comment 2 Document in Chart   Glucose, capillary     Status: Abnormal   Collection Time: 03/15/16  8:00 AM  Result Value Ref Range   Glucose-Capillary 122 (H) 65 - 99 mg/dL   Comment 1 Notify RN    Comment 2 Document in Chart       Imaging Results (Last 48 hours)  Dg Chest 2 View  Result Date: 03/13/2016 CLINICAL DATA:  Unsteady gait and fall today. History of hypertension, diabetes. EXAM: CHEST  2 VIEW COMPARISON:  PA and lateral chest x-ray of October 27, 2015 FINDINGS: Today's study is obtained in a lordotic projection. The lungs are well-expanded and clear. The heart is top-normal in size. The pulmonary vascularity is normal. The patient has undergone previous CABG. The observed bony thorax is unremarkable. IMPRESSION: There is no active cardiopulmonary disease.  Previous CABG. Electronically Signed   By: David  Martinique M.D.   On: 03/13/2016 17:24   Ct Head Wo Contrast  Result Date: 03/13/2016 CLINICAL DATA:  Pt awoke with unsteady gait, stumbling; pt has also developed a Bilateral temporal h/a Pt denies any injury, or prior stroke EXAM: CT HEAD WITHOUT CONTRAST TECHNIQUE: Contiguous axial images were obtained from the base of the skull through the vertex without intravenous contrast. COMPARISON:  None. FINDINGS: Brain: Small area of hypoattenuation noted in the left thalamus, likely a chronic lacune or infarct. No definite acute infarction, hemorrhage, hydrocephalus, extra-axial collection or mass lesion/mass effect. The ventricles and sulci are enlarged reflecting mild  to moderate diffuse atrophy. Mild patchy white matter hypoattenuation is noted consistent with chronic microvascular ischemic change. Vascular: No hyperdense vessel or unexpected calcification. Skull: Normal. Negative for fracture or focal lesion. Sinuses/Orbits: No acute orbital abnormality. Visualized sinuses and mastoid air cells are clear. Other: None. IMPRESSION: 1. No acute intracranial  abnormalities. 2. Atrophy and mild chronic microvascular ischemic change. Left thalamic lacune infarct. Electronically Signed   By: Lajean Manes M.D.   On: 03/13/2016 14:58   Mr Brain Wo Contrast  Result Date: 03/13/2016 CLINICAL DATA:  Unsteady gait.  Fall today. EXAM: MRI HEAD WITHOUT CONTRAST TECHNIQUE: Multiplanar, multiecho pulse sequences of the brain and surrounding structures were obtained without intravenous contrast. COMPARISON:  CT head 03/13/2016 FINDINGS: Brain: Linear focus of restricted diffusion in the left parietal lobe most consistent with acute infarct involving gray and white matter. This is relatively small measuring approximately 5 x 20 mm. No other area of acute infarct. Generalized atrophy of a moderate degree. Chronic microvascular ischemic change in the white matter and pons. Chronic lacunar infarct in the left thalamus. Small chronic infarct left cerebellum. Negative for mass. Chronic microhemorrhage in the right posterior temporal lobe, and left thalamus. Small area of susceptibility in the left parietal subarachnoid space which may represent a small area of acute subarachnoid hemorrhage. This is not definitely seen on the CT scan. No subdural fluid collection. Vascular: Normal arterial flow void. Skull and upper cervical spine: Negative Sinuses/Orbits: Negative Other: None IMPRESSION: Linear focus of restricted diffusion in the left parietal lobe most consistent with acute infarct although somewhat unusual in shape. Small area of subarachnoid hemorrhage in the left parietal region which could  be acute. This is not seen on the CT. Atrophy and chronic microvascular ischemic changes as above. Electronically Signed   By: Franchot Gallo M.D.   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 Pt legally blind 1. Does the need for close, 24 hr/day medical supervision in concert with the patient's rehab needs make it unreasonable for this patient to be served in a less intensive setting? Yes 2. Co-Morbidities requiring supervision/potential complications: CKD, HTN, DM 3. Due to bladder management, bowel management, safety, skin/wound care, disease management, medication administration, pain management and patient education, does the patient require 24 hr/day rehab nursing? Yes 4. Does the patient require coordinated care of a physician, rehab nurse, PT (1-2 hrs/day, 5 days/week), OT (1-2 hrs/day, 5 days/week) and SLP (1-2 hrs/day, 5 days/week) to address physical and functional deficits in the context of the above medical diagnosis(es)? Yes Addressing deficits in the following areas: balance, endurance, locomotion, strength, transferring, bowel/bladder control, bathing, dressing, feeding, grooming, toileting, cognition, speech and psychosocial support 5. Can the patient actively participate in an intensive therapy program of at least 3 hrs of therapy per day at least 5 days per week? Yes 6. The potential for patient to make measurable gains while on inpatient rehab is excellent 7. Anticipated functional outcomes upon discharge from inpatient rehab are modified independent and supervision  with PT, modified independent and supervision with OT, modified independent and supervision with SLP. 8. Estimated rehab length of stay to reach the above functional goals is: 9-13 days 9. Does the patient have adequate social supports and living environment to accommodate these discharge functional goals? Yes 10. Anticipated D/C setting:  Home 11. Anticipated post D/C treatments: HH therapy and Outpatient therapy 12. Overall Rehab/Functional Prognosis: excellent  RECOMMENDATIONS: This patient's condition is appropriate for continued rehabilitative care in the following setting: CIR Patient has agreed to participate in recommended program. Yes Note that insurance prior authorization may be required for reimbursement for recommended care.  Comment: Rehab Admissions Coordinator to follow up.  Thanks,  [name], MD, FAAPMR    Cathlyn Parsons.,  PA-C 03/15/2016

## 2016-03-19 NOTE — Progress Notes (Signed)
Physical Therapy Session Note  Patient Details  Name: Caleb Climer Sr. MRN: [id number] Date of Birth: 23-Jan-1941  Today's Date: 03/19/2016 PT Individual Time: 0830-0900 1445-1530 PT Individual Time Calculation (min): 30 min & 45 min  Short Term Goals: Week 1:  PT Short Term Goal 1 (Week 1): =LTGs due to ELOS  Skilled Therapeutic Interventions/Progress Updates: Tx1: Pt presented in bed asleep but easily aroused. Supine to sit supervision with use of features. ModA threading pants for time management. Pt able to standing with no UE while pulling up and tying pants maintaining F balance. Ambulated to [employer] with RW requiring minA for directional cues and to maintain midline. Performed sit to stand x5 for LE strengthening and improved technique with transfers. Pt able to perform with no UE and legs away from mat, however required additional time for recovery due to fatigue. Pt returned to room via RW and transferred to recliner with call bell within reach and all needs met.   Tx2:  Pt presented sitting in recliner agreeable to therapy. Ambulated approx 145f with RW with minA for direction and tactile cues for maintaining RW in midline. Pt performed stand pivot transfer with directional instruction to NuStep. NuStep L3 x 3 min, L2 x 3 min for encouragement of reciprocal pattern and endurance. Pt required rest break after 3 min bouts. Continued transfer training sit to/from stand w/c to chair for reinforcement of safety and hand placement. Pt requiring decreased cues for safety. Pt returned to room and request to return to bed. Performed sit to supine with min guard with bed flat and was able to boost [facility]. Pt left in bed with call bell within reach and needs met.      Therapy Documentation Precautions:  Precautions Precautions: Fall Precaution Comments: pt is blind, reliance on assistive device Restrictions Weight Bearing Restrictions: No General:    See Function  Navigator for Current Functional Status.   Therapy/Group: Individual Therapy  Octavio Matheney  Letia Guidry, PTA  03/19/2016, 12:22 PM

## 2016-03-19 NOTE — IPOC Note (Addendum)
Overall Plan of Care (IPOC) Patient Details Name: Caleb Creech Sr. MRN: [id number] DOB: 11-Mar-1940  Admitting Diagnosis: L CVA  Hospital Problems: Principal Problem:   Parietal lobe infarction [facility]) Active Problems:   DM (diabetes mellitus), type 2 with ophthalmic complications ([place])   Chronic kidney disease (CKD), stage IV (severe) (HCC)   Stage 3 chronic kidney disease   Benign essential HTN   Blind     Functional Problem List: Nursing Bladder, Endurance, Medication Management, Safety, Perception  PT Balance, Safety, Endurance, Motor, Pain, Perception  OT Balance, Endurance, Motor, Vision  SLP    TR         Basic ADL's: OT Eating, Grooming, Bathing, Dressing, Toileting     Advanced  ADL's: OT Simple Meal Preparation     Transfers: PT Bed Mobility, Bed to Chair, Car, [employer], Floor  OT Toilet, Tub/Shower     Locomotion: PT Stairs, Emergency planning/management officer, Ambulation     Additional Impairments: OT Fuctional Use of Upper Extremity  SLP        TR      Anticipated Outcomes Item Anticipated Outcome  Self Feeding Mod I  Swallowing      Basic self-care  Mod I  Toileting  Mod I   Bathroom Transfers Supervision  Bowel/Bladder  Mod I  Transfers  mod I  Locomotion  mod I for transfers, supervision for ambulation in unfamiliar environment  Communication     Cognition     Pain  2 or less  Safety/Judgment  supervision   Therapy Plan: PT Intensity: Minimum of 1-2 x/day ,45 to 90 minutes PT Frequency: 5 out of 7 days PT Duration Estimated Length of Stay: 7-10 days OT Intensity: Minimum of 1-2 x/day, 45 to 90 minutes OT Frequency: 5 out of 7 days OT Duration/Estimated Length of Stay: 7-10 days         Team Interventions: Nursing Interventions Patient/Family Education, Bladder Management, Disease Management/Prevention, Medication Management, Discharge Planning  PT interventions Ambulation/gait training, Community reintegration, DME/adaptive equipment  instruction, Neuromuscular re-education, Psychosocial support, Stair training, UE/LE Strength taining/ROM, UE/LE Coordination activities, Therapeutic Activities, Training and development officer, Discharge planning, Cognitive remediation/compensation, Functional mobility training, Patient/family education, Splinting/orthotics, Therapeutic Exercise, Visual/perceptual remediation/compensation  OT Interventions Training and development officer, Cognitive remediation/compensation, Community reintegration, Discharge planning, DME/adaptive equipment instruction, Functional mobility training, Neuromuscular re-education, Patient/family education, Self Care/advanced ADL retraining, Therapeutic Activities, Therapeutic Exercise, UE/LE Strength taining/ROM, UE/LE Coordination activities, Visual/perceptual remediation/compensation  SLP Interventions    TR Interventions    SW/CM Interventions Discharge Planning, Psychosocial Support, Patient/Family Education    Team Discharge Planning: Destination: PT-Home ,OT- Home , SLP-Home Projected Follow-up: PT-Home health PT, 24 hour supervision/assistance (initial 24/7 supervision), OT-  Home health OT, SLP-None Projected Equipment Needs: PT-Rolling walker with 5" wheels, OT- To be determined, SLP-None recommended by SLP Equipment Details: PT- , OT-  Patient/family involved in discharge planning: PT- Patient,  OT-Patient, SLP-Patient  MD ELOS: 7-10 days. Medical Rehab Prognosis:  Good Assessment:  76 y.o. right handed male with history significant legally blind, CAD maintained on aspirin, type 2 diabetes mellitus and peripheral neuropathy. He presented 03/13/2016 with dizziness, altered mental status and unsteady gait. Cranial CT scan negative. MRI showed restricted diffusion in the left parietal lobe consistent with acute infarct. Small area of subarachnoid hemorrhage in the left parietal region felt to be possibly acute. Patient did not receive TPA. Echocardiogram with ejection  fraction of 20% grade 1 diastolic dysfunction. Carotid Dopplers with no ICA stenosis. Neurology consulted presently on aspirin for CVA prophylaxis. No  plan for TEE or loop recorder. Tolerating a regular consistency diet. Pt with resulting functional deficits with balance, mobility, self-care.  Will set goals for Mod I for most tasks with PT/OT.   See Team Conference Notes for weekly updates to the plan of care

## 2016-03-20 ENCOUNTER — Inpatient Hospital Stay (HOSPITAL_COMMUNITY): Payer: Medicare HMO | Admitting: Occupational Therapy

## 2016-03-20 ENCOUNTER — Inpatient Hospital Stay (HOSPITAL_COMMUNITY): Payer: Medicare HMO | Admitting: Physical Therapy

## 2016-03-20 DIAGNOSIS — R0989 Other specified symptoms and signs involving the circulatory and respiratory systems: Secondary | ICD-10-CM

## 2016-03-20 LAB — GLUCOSE, CAPILLARY
GLUCOSE-CAPILLARY: 95 mg/dL (ref 65–99)
GLUCOSE-CAPILLARY: 144 mg/dL — AB (ref 65–99)
GLUCOSE-CAPILLARY: 121 mg/dL — AB (ref 65–99)
Glucose-Capillary: 136 mg/dL — ABNORMAL HIGH (ref 65–99)
Glucose-Capillary: 160 mg/dL — ABNORMAL HIGH (ref 65–99)

## 2016-03-20 MED ORDER — LOSARTAN POTASSIUM 50 MG PO TABS
100.0000 mg | ORAL_TABLET | Freq: Every day | ORAL | Status: DC
  Administered 2016-03-21 – 2016-03-28 (×8): 100 mg via ORAL
  Filled 2016-03-20 (×9): qty 2

## 2016-03-20 MED ORDER — ACETAMINOPHEN 325 MG PO TABS
650.0000 mg | ORAL_TABLET | Freq: Four times a day (QID) | ORAL | Status: DC | PRN
  Administered 2016-03-20 – 2016-03-23 (×3): 650 mg via ORAL
  Filled 2016-03-20 (×3): qty 2

## 2016-03-20 NOTE — Progress Notes (Signed)
[place] PHYSICAL MEDICINE & REHABILITATION     PROGRESS NOTE  Subjective/Complaints:  Pt seen laying in bed this AM, eating breakfast.  He slept well overnight.  He denies complaints this AM.    ROS: Denies CP, SOB, N/V/D.  Objective: Vital Signs: Blood pressure (!) 155/76, pulse 69, temperature 98 F (36.7 C), temperature source Oral, resp. rate 16, SpO2 100 %. No results found.  Recent Labs  03/19/16 0721  WBC 7.9  HGB 13.0  HCT 39.8  PLT 196    Recent Labs  03/19/16 0721  NA 138  K 4.3  CL 108  GLUCOSE 112*  BUN 30*  CREATININE 2.22*  CALCIUM 9.1   CBG (last 3)   Recent Labs  03/19/16 1717 03/19/16 2210 03/20/16 0657  GLUCAP 136* 114* 95    Wt Readings from Last 3 Encounters:  03/13/16 99.8 kg (220 lb)  01/03/16 91.6 kg (202 lb)  12/12/15 91.2 kg (201 lb)    Physical Exam:  BP (!) 155/76 (BP Location: Right Arm)   Pulse 69   Temp 98 F (36.7 C) (Oral)   Resp 16   SpO2 100%  Constitutional: No distress. Vital signs reviewed. Well-developed.  HENT: Normocephalicand atraumatic.  Eyes: Blind  Cardiovascular: RRR. No JVD. Respiratory: Effort normal breath sounds normal.  GI: Soft. Bowel sounds are normal.  Musculoskeletal: He exhibits no edema. No tenderness.  Neurological: He is alertand oriented x3.  Follows commands. Fair awareness and insight. Right central 7.  Motor: RUE 4+-5/5 deltoid, biceps, triceps, wrist, hand. LUE 5/5 (unchanged).  RLE 4+-5/5 HF, 4/5 KE and ADF/PF.  LLE 5/5.  Skin: Warm and dry. Intact.  Psychiatric: He has a normal mood and affect. His behavior is normal.    Assessment/Plan: 1. 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 Physiatrist is providing close team supervision and 24 hour management of active medical problems listed below. Physiatrist and rehab team continue to assess barriers to  discharge/monitor patient progress toward functional and medical goals.  Function:  Bathing Bathing position   Position: Shower  Bathing parts Body parts bathed by patient: Right arm, Left arm, Chest, Abdomen, Front perineal area, Buttocks, Right upper leg, Left upper leg, Right lower leg, Left lower leg Body parts bathed by helper: Back  Bathing assist Assist Level: Touching or steadying assistance(Pt > 75%)      Upper Body Dressing/Undressing Upper body dressing   What is the patient wearing?: Pull over shirt/dress     Pull over shirt/dress - Perfomed by patient: Thread/unthread left sleeve, Put head through opening, Pull shirt over trunk, Thread/unthread right sleeve Pull over shirt/dress - Perfomed by helper: Thread/unthread right sleeve        Upper body assist Assist Level: Supervision or verbal cues      Lower Body Dressing/Undressing Lower body dressing   What is the patient wearing?: Underwear, Pants, Non-skid slipper socks, Socks Underwear - Performed by patient: Thread/unthread right underwear leg, Thread/unthread left underwear leg, Pull underwear up/down   Pants- Performed by patient: Thread/unthread right pants leg, Thread/unthread left pants leg, Pull pants up/down, Fasten/unfasten pants Pants- Performed by helper: Thread/unthread right pants leg Non-skid slipper socks- Performed by patient: Don/doff right sock, Don/doff left sock   Socks - Performed by patient: Don/doff left sock, Don/doff right sock   Shoes - Performed by patient: Don/doff left shoe, Don/doff right shoe  Lower body assist Assist for lower body dressing: Supervision or verbal cues      Toileting Toileting Toileting activity did not occur: No continent bowel/bladder event Toileting steps completed by patient: Adjust clothing prior to toileting, Performs perineal hygiene, Adjust clothing after toileting Toileting steps completed by helper: Performs perineal hygiene, Adjust  clothing prior to toileting, Adjust clothing after toileting    Toileting assist     Transfers Chair/bed transfer   Chair/bed transfer method: Ambulatory Chair/bed transfer assist level: Touching or steadying assistance (Pt > 75%) Chair/bed transfer assistive device: Armrests, Medical sales representative     Max distance: 150 Assist level: Touching or steadying assistance (Pt > 75%)   Wheelchair Wheelchair activity did not occur: N/A        Cognition Comprehension Comprehension assist level: Follows complex conversation/direction with extra time/assistive device  Expression Expression assist level: Expresses complex ideas: With extra time/assistive device  Social Interaction Social Interaction assist level: Interacts appropriately with others - No medications needed.  Problem Solving Problem solving assist level: Solves basic 90% of the time/requires cueing < 10% of the time  Memory Memory assist level: Requires cues to use assistive device    Medical Problem List and Plan: 1. 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 DVT Prophylaxis/Anticoagulation: SCDs. Monitor for any signs of DVT 3. Pain Management: Tylenol as needed 4. Mood: Provide emotional support. Continue Prozac 20 mg daily. 5. Neuropsych: This patient iscapable of making decisions on hisown behalf. 6. Skin/Wound Care: Routine skin checks 7. Fluids/Electrolytes/Nutrition: Routine I&Os  education regarding appropriate food choices 8.Legally blind. Patient used a cane prior to admission was able to navigate about his apartment 9.Hypertension.   Cont Toprol-XL 75 mg daily.   Orthostatics negative  Appear to be elevated early in AM, Cozaar 100 mg changed to qhs 2/28 10.Diabetes mellitus peripheral neuropathy. Hemoglobin A1c 7.2. Trandjenta 5 mg daily. Check blood sugars before meals and at bedtime. Diabetic teaching appropriate.  Overall controlled  2/27 11.CAD. Continue aspirin therapy. No chest pain or shortness of breath 12.BPH.Proscar 5mg  daily.   PVR 3 negative 13.Hyperlipidemia. Pravachol 20 mg daily 14.Constipation.linzess 290 mcg daily, Miralax daily 15. CKD III  Cr 2.22 on 2/26  Cont to monitor  Encourage adequate po intake   LOS (Days) 4 A FACE TO FACE EVALUATION WAS PERFORMED  Summerlyn Fickel Lorie Phenix 03/20/2016 9:51 AM

## 2016-03-20 NOTE — Progress Notes (Signed)
Occupational Therapy Session Note  Patient Details  Name: Caleb Gloria Sr. MRN: [id number] Date of Birth: [birth year]  Today's Date: 03/20/2016 OT Individual Time: 1000-1048 and 1345-1413 OT Individual Time Calculation (min): 48 min  and 28 min Today's Date: 03/20/2016 OT Missed Time: 12 Minutes Missed Time Reason: Patient fatigue   Short Term Goals: Week 1:  OT Short Term Goal 1 (Week 1): LTG=STG 2/2 estimated LOS  Skilled Therapeutic Interventions/Progress Updates:    Session 1: Upon entering the room, pt seated in recliner chair with no c/o pain. Pt reports feeling extremely fatigued and initially needing coaxing for participation. OT printed of education for family and educated pt on energy conservation with self care as well as when in the community. Pt verbalized understanding. Pt ambulated with RW 75' to day room with min A and multimodal cues secondary to vision loss. Pt seated in low, soft couch similar to home environment and was able to stand from couch with steady assistance for balance. Pt returning to room at end of session secondary to fatigue and pt declining further OT intervention at this time. Pt performed sit >supine with min A. Call bell and all needed items within reach upon exiting the room.   Session 2: Upon entering the room, pt seated in wheelchair awaiting therapist. Pt with no c/o pain this session but does report fatigue. He is agreeable to OT intervention but declines ambulation at this time. OT propelled pt via wheelchair to day room for time management. Pt engaged in dealing card tasks with R UE as well as flipping each card over with R hand. Pt needing over 5 minutes to complete dealing task. Pt then engaged in isolated finger movement tasks with increased difficultly with R 3rd and 4th digit. However, with continued practice pt was able to isolate each finger of R UE. Pt returned to room and bed with steady assistance and use of RW. Call bell and all needed items  within reach upon exiting the room.    Therapy Documentation Precautions:  Precautions Precautions: Fall Precaution Comments: pt is blind, reliance on assistive device Restrictions Weight Bearing Restrictions: No General: General OT Amount of Missed Time: 12 Minutes Vital Signs:  Pain: Pain Assessment Pain Assessment: No/denies pain Pain Score: 0-No pain ADL: ADL ADL Comments: Please see functional navigator Exercises:   Other Treatments:    See Function Navigator for Current Functional Status.   Therapy/Group: Individual Therapy  Gypsy Decant 03/20/2016, 10:59 AM

## 2016-03-20 NOTE — Progress Notes (Signed)
Physical Therapy Note  Patient Details  Name: Caleb Brackins Sr. MRN: [id number] Date of Birth: 1940-04-13 Today's Date: 03/20/2016    Time: 1300-1325 25 minutes  1:1 Pt c/o pain in Lt LE, RN made aware, meds given after session.  Pt requests no standing activity due to LE pain. Pt performed nu step x 7 minutes level 4 for UE/LE strength and endurance.  W/c mobility for UE strengthening 2 x 75', rest breaks due to fatigue. Pt able to perform all transfers during session with steadying assist.   Caleb Dunn 03/20/2016, 1:25 PM

## 2016-03-20 NOTE — Progress Notes (Signed)
Physical Therapy Session Note  Patient Details  Name: Caleb Sovine Sr. MRN: [id number] Date of Birth: November 04, 1940  Today's Date: 03/20/2016 PT Individual Time: 0800-0915 PT Individual Time Calculation (min): 75 min   Short Term Goals: Week 1:  PT Short Term Goal 1 (Week 1): =LTGs due to ELOS  Skilled Therapeutic Interventions/Progress Updates: Pt presented in bed completing breakfast. Supine to sit with HOB elevated with supervision. Ambulated 185f with RW with min guard for directional assistance. Verbal cues for increased foot clearance and increasing step length. Performed standing balance activities on compliant and non-compliant surface with x1 LOB requiring PTA min A for correction. Bilaterally heel cord stretch on wedge x 2 min. Performed stairs ascend/decend x 4 with min guard and 2 rails for LE strengthening and coordination. Pt required frequent rest breaks due to fatigue. Pt ambulated return to room from rehab gym with x 2 standing rest breaks and request to sit in recliner at end of session. Pt left in recliner with call button within reach and all current needs met.      Therapy Documentation Precautions:  Precautions Precautions: Fall Precaution Comments: pt is blind, reliance on assistive device Restrictions Weight Bearing Restrictions: No  See Function Navigator for Current Functional Status.   Therapy/Group: Individual Therapy  Caleb Dunn  Noralee Dutko, PTA  03/20/2016, 3:54 PM

## 2016-03-21 ENCOUNTER — Inpatient Hospital Stay (HOSPITAL_COMMUNITY): Payer: Medicare HMO | Admitting: Physical Therapy

## 2016-03-21 ENCOUNTER — Inpatient Hospital Stay (HOSPITAL_COMMUNITY): Payer: Medicare HMO | Admitting: Occupational Therapy

## 2016-03-21 ENCOUNTER — Other Ambulatory Visit: Payer: Self-pay | Admitting: *Deleted

## 2016-03-21 DIAGNOSIS — E1139 Type 2 diabetes mellitus with other diabetic ophthalmic complication: Secondary | ICD-10-CM

## 2016-03-21 LAB — GLUCOSE, CAPILLARY
GLUCOSE-CAPILLARY: 138 mg/dL — AB (ref 65–99)
Glucose-Capillary: 119 mg/dL — ABNORMAL HIGH (ref 65–99)
Glucose-Capillary: 151 mg/dL — ABNORMAL HIGH (ref 65–99)
Glucose-Capillary: 135 mg/dL — ABNORMAL HIGH (ref 65–99)

## 2016-03-21 MED ORDER — SITAGLIPTIN PHOSPHATE 50 MG PO TABS
50.0000 mg | ORAL_TABLET | Freq: Every day | ORAL | 1 refills | Status: DC
Start: 2016-03-21 — End: 2016-03-29

## 2016-03-21 NOTE — Progress Notes (Signed)
[place] PHYSICAL MEDICINE & REHABILITATION     PROGRESS NOTE  Subjective/Complaints:  Pt seen sitting up in his chair this AM, working with OT. He slept well overnight.  He appreciated therapies.   ROS: Denies CP, SOB, N/V/D.  Objective: Vital Signs: Blood pressure (!) 147/62, pulse 67, temperature 97.5 F (36.4 C), temperature source Oral, resp. rate 18, SpO2 100 %. No results found.  Recent Labs  03/19/16 0721  WBC 7.9  HGB 13.0  HCT 39.8  PLT 196    Recent Labs  03/19/16 0721  NA 138  K 4.3  CL 108  GLUCOSE 112*  BUN 30*  CREATININE 2.22*  CALCIUM 9.1   CBG (last 3)   Recent Labs  03/20/16 1656 03/20/16 2024 03/21/16 0642  GLUCAP 144* 121* 138*    Wt Readings from Last 3 Encounters:  03/13/16 99.8 kg (220 lb)  01/03/16 91.6 kg (202 lb)  12/12/15 91.2 kg (201 lb)    Physical Exam:  BP (!) 147/62 (BP Location: Left Arm)   Pulse 67   Temp 97.5 F (36.4 C) (Oral)   Resp 18   SpO2 100%  Constitutional: No distress. Vital signs reviewed. Well-developed.  HENT: Normocephalicand atraumatic.  Eyes: Blind  Cardiovascular: RRR. No JVD. Respiratory: Effort normal breath sounds normal.  GI: Soft. Bowel sounds are normal.  Musculoskeletal: He exhibits no edema. No tenderness.  Neurological: He is alertand oriented x3.  Follows commands. Fair awareness and insight. Right central 7.  Motor: RUE 4+-5/5 deltoid, biceps, triceps, wrist, hand. LUE 5/5 (stable).  RLE 4+-5/5 HF, 4/5 KE and ADF/PF.  LLE 5/5.  Skin: Warm and dry. Intact.  Psychiatric: He has a normal mood and affect. His behavior is normal.    Assessment/Plan: 1. 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 Physiatrist is providing close team supervision and 24 hour management of active medical problems listed below. Physiatrist and rehab team continue to assess barriers to  discharge/monitor patient progress toward functional and medical goals.  Function:  Bathing Bathing position   Position: Shower  Bathing parts Body parts bathed by patient: Right arm, Left arm, Chest, Abdomen, Front perineal area, Buttocks, Right upper leg, Left upper leg, Right lower leg, Left lower leg Body parts bathed by helper: Back  Bathing assist Assist Level: Touching or steadying assistance(Pt > 75%)      Upper Body Dressing/Undressing Upper body dressing   What is the patient wearing?: Pull over shirt/dress     Pull over shirt/dress - Perfomed by patient: Thread/unthread left sleeve, Put head through opening, Pull shirt over trunk, Thread/unthread right sleeve Pull over shirt/dress - Perfomed by helper: Thread/unthread right sleeve        Upper body assist Assist Level: Supervision or verbal cues      Lower Body Dressing/Undressing Lower body dressing   What is the patient wearing?: Underwear, Pants, Non-skid slipper socks, Socks Underwear - Performed by patient: Thread/unthread right underwear leg, Thread/unthread left underwear leg, Pull underwear up/down   Pants- Performed by patient: Thread/unthread right pants leg, Thread/unthread left pants leg, Pull pants up/down, Fasten/unfasten pants Pants- Performed by helper: Thread/unthread right pants leg Non-skid slipper socks- Performed by patient: Don/doff right sock, Don/doff left sock   Socks - Performed by patient: Don/doff left sock, Don/doff right sock   Shoes - Performed by patient: Don/doff left shoe, Don/doff right shoe  Lower body assist Assist for lower body dressing: Supervision or verbal cues      Toileting Toileting Toileting activity did not occur: No continent bowel/bladder event Toileting steps completed by patient: Adjust clothing prior to toileting, Performs perineal hygiene, Adjust clothing after toileting Toileting steps completed by helper: Performs perineal hygiene, Adjust  clothing prior to toileting, Adjust clothing after toileting [place]: Grab bar or rail  Toileting assist Assist level: Supervision or verbal cues   Transfers Chair/bed transfer   Chair/bed transfer method: Ambulatory Chair/bed transfer assist level: Touching or steadying assistance (Pt > 75%) Chair/bed transfer assistive device: Armrests, Medical sales representative     Max distance: 125 Assist level: Touching or steadying assistance (Pt > 75%)   Wheelchair Wheelchair activity did not occur: N/A        Cognition Comprehension Comprehension assist level: Follows complex conversation/direction with extra time/assistive device  Expression Expression assist level: Expresses complex ideas: With extra time/assistive device  Social Interaction Social Interaction assist level: Interacts appropriately with others - No medications needed.  Problem Solving Problem solving assist level: Solves basic 90% of the time/requires cueing < 10% of the time  Memory Memory assist level: Recognizes or recalls 90% of the time/requires cueing < 10% of the time    Medical Problem List and Plan: 1. 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 DVT Prophylaxis/Anticoagulation: SCDs. Monitor for any signs of DVT 3. Pain Management: Tylenol as needed 4. Mood: Provide emotional support. Continue Prozac 20 mg daily. 5. Neuropsych: This patient iscapable of making decisions on hisown behalf. 6. Skin/Wound Care: Routine skin checks 7. Fluids/Electrolytes/Nutrition: Routine I&Os  education regarding appropriate food choices 8.Legally blind. Patient used a cane prior to admission was able to navigate about his apartment 9.Hypertension.   Cont Toprol-XL 75 mg daily.   Orthostatics negative  Appear to be elevated early in AM, Cozaar 100 mg changed to qhs 2/28 10.Diabetes mellitus peripheral neuropathy. Hemoglobin A1c 7.2. Trandjenta 5 mg  daily. Check blood sugars before meals and at bedtime. Diabetic teaching appropriate.  Overall controlled 2/28 11.CAD. Continue aspirin therapy. No chest pain or shortness of breath 12.BPH.Proscar 5mg  daily.   PVR 3 negative 13.Hyperlipidemia. Pravachol 20 mg daily 14.Constipation.linzess 290 mcg daily, Miralax daily 15. CKD III  Cr 2.22 on 2/26  Will consider repeat labs end of the week  Cont to monitor  Encourage adequate po intake   LOS (Days) 5 A FACE TO FACE EVALUATION WAS PERFORMED  Merle Cirelli Lorie Phenix 03/21/2016 9:19 AM

## 2016-03-21 NOTE — Progress Notes (Addendum)
Physical Therapy Note  Patient Details  Name: Caleb Tosh Sr. MRN: [id number] Date of Birth: [birth year] Today's Date: 03/21/2016    2:05 PM - Attempted to see pt to make up missed time. Pt sleeping in bed but easily awakened, pt reports he has been in therapy all day & requests to rest. Pt left in bed with all needs within reach.    Waunita Schooner 03/21/2016, 2:10 PM

## 2016-03-21 NOTE — Progress Notes (Signed)
Physical Therapy Session Note  Patient Details  Name: Caleb Weatherall Sr. MRN: [id number] Date of Birth: 04/10/1940  Today's Date: 03/21/2016 PT Individual Time: 7065-8260 1300-1330 PT Individual Time Calculation (min): 45 min and 30 min  Short Term Goals: Week 1:  PT Short Term Goal 1 (Week 1): =LTGs due to ELOS  Skilled Therapeutic Interventions/Progress Updates: Tx1: Pt presented in w/c agreeable to therapy. Performed car transfer with supervision providing cues for directional assistance. Performed floor transfer x 2 using mat for support with minA. Ambulation with RW min guard 143f with x1 brief standing rest with cues for direction support. Pt returned to bed after session sit to supine mod I with additional time required. Pt left in bed at end of session with call bell within reach and needs met.   Tx2: Pt presented in bed agreeable to therapy. Bed mobility mod I with additional time. Performed gait with HHA (pt's hand on shoulder) and SPC with improved step clearance with no LOB. Performed w/c to mat transfers gait with SPC required minA. Continued gait training add'l 75 ft with SPC pt demonstrated demonstrated increased shuffling gait and narrow BOS. Pt with x 1 LOB requiring assist from PTA for recovery. Discussed with pt use of RW in home upon d/c. Pt returned to room and returned to bed in same manner as prior. Pt left in bed with call bell within reach and needs met.      Therapy Documentation Precautions:  Precautions Precautions: Fall Precaution Comments: pt is blind, reliance on assistive device Restrictions Weight Bearing Restrictions: No   See Function Navigator for Current Functional Status.   Therapy/Group: Individual Therapy  Sahily Biddle  Perris Conwell, PTA  03/21/2016, 12:52 PM

## 2016-03-21 NOTE — Progress Notes (Signed)
Occupational Therapy Session Note  Patient Details  Name: Caleb Delph Sr. MRN: [id number] Date of Birth: 1940/02/08  Today's Date: 03/21/2016 OT Individual Time: 8502-7741 and 1100-1156 OT Individual Time Calculation (min): 54 min and 56 min   Short Term Goals: Week 1:  OT Short Term Goal 1 (Week 1): LTG=STG 2/2 estimated LOS  Skilled Therapeutic Interventions/Progress Updates:   Session 1: Upon entering the room, pt supine in bed with no c/o pain. Pt performing supine >sit with supervision. Pt ambulating to sink with min A and directional cues secondary to low vision. Pt standing at sink for grooming tasks with close supervision. Pt declined self care requesting to eat breakfast. OT assisted pt with opening containers, cutting meat, and describing where each item was on tray in order to locate. OT and pt discussing current progress towards goals, d/c recommendations for equipment and follow up, and setting goals for this week. Pt remaining in wheelchair at end of session with quick release belt donned. Call bell and all needed items within reach upon exiting the room.   Session 2: Upon entering the room, pt supine in bed with no c/o pain this session. Pt requiring encouragement for participation. Pt agreeable to shower this session. Pt ambulating with RW and mod cues for RW advancement/directions secondary to low vision. Pt seated on TTB for bathing at shower level. He stood to wash buttocks and peri area with steady assistance for balance. Pt ambulating to sit on EOB to don clothing items with overall close supervision. Pt remained seated while brushing hair and applying lotion. Pt returned to supine at end of session secondary to fatigue. Call bell and all needed items within reach upon exiting the room.   Therapy Documentation Precautions:  Precautions Precautions: Fall Precaution Comments: pt is blind, reliance on assistive device Restrictions Weight Bearing Restrictions:  No General:   Vital Signs: Therapy Vitals Temp: 97.5 F (36.4 C) Temp Source: Oral Pulse Rate: 67 Resp: 18 BP: (!) 147/62 Patient Position (if appropriate): Lying Oxygen Therapy SpO2: 100 % O2 Device: Not Delivered Pain:   ADL: ADL ADL Comments: Please see functional navigator Exercises:   Other Treatments:    See Function Navigator for Current Functional Status.   Therapy/Group: Individual Therapy  Gypsy Decant 03/21/2016, 7:59 AM

## 2016-03-21 NOTE — Progress Notes (Signed)
Occupational Therapy Session Note  Patient Details  Name: Caleb Epps Sr. MRN: [id number] Date of Birth: July 23, 1940  Today's Date: 03/21/2016 OT Individual Time: 1513-1530 OT Individual Time Calculation (min): 17 min  MAKE UP THERAPY TIME   Short Term Goals: Week 1:  OT Short Term Goal 1 (Week 1): LTG=STG 2/2 estimated LOS  Skilled Therapeutic Interventions/Progress Updates:    Upon entering the room, pt supine in bed. OT educated pt on moving room in order to set up a more similar home environment to allow him to navigate space safer with RW. Room set up changed and RN notified. OT reviewed with pt where furniture items were not placed. Pt remained in bed at end of session as he declined further mobility secondary to fatigue. Call bell and all needed items within reach upon exiting the room.   Therapy Documentation Precautions:  Precautions Precautions: Fall Precaution Comments: pt is blind, reliance on assistive device Restrictions Weight Bearing Restrictions: No General:   Vital Signs: Therapy Vitals Temp: 98.7 F (37.1 C) Temp Source: Oral Pulse Rate: 61 Resp: 18 BP: 133/60 Patient Position (if appropriate): Lying Oxygen Therapy SpO2: 100 % O2 Device: Not Delivered Pain: Pain Assessment Pain Assessment: No/denies pain ADL: ADL ADL Comments: Please see functional navigator Exercises:   Other Treatments:    See Function Navigator for Current Functional Status.   Therapy/Group: Individual Therapy  Gypsy Decant 03/21/2016, 3:40 PM

## 2016-03-22 ENCOUNTER — Inpatient Hospital Stay (HOSPITAL_COMMUNITY): Payer: Medicare HMO | Admitting: Occupational Therapy

## 2016-03-22 ENCOUNTER — Inpatient Hospital Stay (HOSPITAL_COMMUNITY): Payer: Medicare HMO | Admitting: Physical Therapy

## 2016-03-22 DIAGNOSIS — E1142 Type 2 diabetes mellitus with diabetic polyneuropathy: Secondary | ICD-10-CM

## 2016-03-22 LAB — GLUCOSE, CAPILLARY
GLUCOSE-CAPILLARY: 124 mg/dL — AB (ref 65–99)
GLUCOSE-CAPILLARY: 140 mg/dL — AB (ref 65–99)
Glucose-Capillary: 108 mg/dL — ABNORMAL HIGH (ref 65–99)
Glucose-Capillary: 192 mg/dL — ABNORMAL HIGH (ref 65–99)

## 2016-03-22 NOTE — Progress Notes (Signed)
[place] PHYSICAL MEDICINE & REHABILITATION     PROGRESS NOTE  Subjective/Complaints:  Pt seen ambulating with OT. He states he slept well overnight. He is in good spirits.  ROS: Denies CP, SOB, N/V/D.  Objective: Vital Signs: Blood pressure (!) 167/72, pulse 69, temperature 97.8 F (36.6 C), temperature source Oral, resp. rate 18, weight 89.7 kg (197 lb 11.2 oz), SpO2 99 %. No results found. No results for input(s): WBC, HGB, HCT, PLT in the last 72 hours. No results for input(s): NA, K, CL, GLUCOSE, BUN, CREATININE, CALCIUM in the last 72 hours.  Invalid input(s): CO CBG (last 3)   Recent Labs  03/21/16 2117 03/22/16 0643 03/22/16 1156  GLUCAP 135* 108* 124*    Wt Readings from Last 3 Encounters:  03/21/16 89.7 kg (197 lb 11.2 oz)  03/13/16 99.8 kg (220 lb)  01/03/16 91.6 kg (202 lb)    Physical Exam:  BP (!) 167/72 (BP Location: Left Arm)   Pulse 69   Temp 97.8 F (36.6 C) (Oral)   Resp 18   Wt 89.7 kg (197 lb 11.2 oz)   SpO2 99%   BMI 31.91 kg/m  Constitutional: No distress. Vital signs reviewed. Well-developed.  HENT: Normocephalicand atraumatic.  Eyes: Blind  Cardiovascular: RRR. No JVD. Respiratory: Effort normal breath sounds normal.  GI: Soft. Bowel sounds are normal.  Musculoskeletal: He exhibits no edema. No tenderness.  Neurological: He is alertand oriented x3.  Follows commands. Fair awareness and insight. Right central 7.  Motor: RUE 4+-5/5 deltoid, biceps, triceps, wrist, hand. LUE 5/5 (ucnhanged).  RLE 4+-5/5 HF, 4/5 KE and ADF/PF.  LLE 5/5.  Skin: Warm and dry. Intact.  Psychiatric: He has a normal mood and affect. His behavior is normal.    Assessment/Plan: 1. 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 Physiatrist is providing close team supervision and 24 hour management of active medical problems listed  below. Physiatrist and rehab team continue to assess barriers to discharge/monitor patient progress toward functional and medical goals.  Function:  Bathing Bathing position   Position: Shower  Bathing parts Body parts bathed by patient: Right arm, Left arm, Chest, Abdomen, Front perineal area, Buttocks, Right upper leg, Left upper leg, Right lower leg, Left lower leg Body parts bathed by helper: Back  Bathing assist Assist Level: Touching or steadying assistance(Pt > 75%)      Upper Body Dressing/Undressing Upper body dressing   What is the patient wearing?: Pull over shirt/dress     Pull over shirt/dress - Perfomed by patient: Thread/unthread left sleeve, Put head through opening, Pull shirt over trunk, Thread/unthread right sleeve Pull over shirt/dress - Perfomed by helper: Thread/unthread right sleeve        Upper body assist Assist Level: Supervision or verbal cues      Lower Body Dressing/Undressing Lower body dressing   What is the patient wearing?: Underwear, Pants, Non-skid slipper socks Underwear - Performed by patient: Thread/unthread right underwear leg, Thread/unthread left underwear leg, Pull underwear up/down   Pants- Performed by patient: Thread/unthread right pants leg, Thread/unthread left pants leg, Pull pants up/down, Fasten/unfasten pants Pants- Performed by helper: Thread/unthread right pants leg Non-skid slipper socks- Performed by patient: Don/doff right sock, Don/doff left sock   Socks - Performed by patient: Don/doff left sock, Don/doff right sock   Shoes - Performed by patient: Don/doff left shoe, Don/doff right shoe  Lower body assist Assist for lower body dressing: Supervision or verbal cues      Toileting Toileting Toileting activity did not occur: No continent bowel/bladder event Toileting steps completed by patient: (P) Adjust clothing prior to toileting, Performs perineal hygiene, Adjust clothing after toileting Toileting  steps completed by helper: Performs perineal hygiene, Adjust clothing prior to toileting, Adjust clothing after toileting [place]: (P) Grab bar or rail  Toileting assist Assist level: (P) Supervision or verbal cues   Transfers Chair/bed transfer   Chair/bed transfer method: Ambulatory Chair/bed transfer assist level: Supervision or verbal cues Chair/bed transfer assistive device: Armrests, Medical sales representative     Max distance: 75 Assist level: Touching or steadying assistance (Pt > 75%)   Wheelchair Wheelchair activity did not occur: N/A        Cognition Comprehension Comprehension assist level: Follows complex conversation/direction with extra time/assistive device  Expression Expression assist level: Expresses complex ideas: With extra time/assistive device  Social Interaction Social Interaction assist level: Interacts appropriately with others - No medications needed.  Problem Solving Problem solving assist level: Solves basic 90% of the time/requires cueing < 10% of the time  Memory Memory assist level: Recognizes or recalls 90% of the time/requires cueing < 10% of the time    Medical Problem List and Plan: 1. 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 DVT Prophylaxis/Anticoagulation: SCDs. Monitor for any signs of DVT 3. Pain Management: Tylenol as needed 4. Mood: Provide emotional support. Continue Prozac 20 mg daily. 5. Neuropsych: This patient iscapable of making decisions on hisown behalf. 6. Skin/Wound Care: Routine skin checks 7. Fluids/Electrolytes/Nutrition: Routine I&Os  education regarding appropriate food choices 8.Legally blind. Patient used a cane prior to admission was able to navigate about his apartment 9.Hypertension.   Cont Toprol-XL 75 mg daily.   Orthostatics negative  Cozaar 100 mg changed to qhs 2/28  Remains elevated this morning, will consider further increase  tomorrow. 10.Diabetes mellitus peripheral neuropathy. Hemoglobin A1c 7.2. Trandjenta 5 mg daily. Check blood sugars before meals and at bedtime. Diabetic teaching appropriate.  Overall controlled 3/1 11.CAD. Continue aspirin therapy. No chest pain or shortness of breath 12.BPH.Proscar 5mg  daily.   PVR 3 negative 13.Hyperlipidemia. Pravachol 20 mg daily 14.Constipation.linzess 290 mcg daily, Miralax daily 15. CKD III  Cr 2.22 on 2/26  Labs ordered for tomorrow  Cont to monitor  Encourage adequate po intake   LOS (Days) 6 A FACE TO FACE EVALUATION WAS PERFORMED  Shreyansh Tiffany Lorie Phenix 03/22/2016 12:40 PM

## 2016-03-22 NOTE — Progress Notes (Signed)
Occupational Therapy Session Note  Patient Details  Name: Caleb Lorusso Sr. MRN: [id number] Date of Birth: 1940/11/30  Today's Date: 03/22/2016 OT Individual Time: 0703-0800 and 8657-8469 OT Individual Time Calculation (min): 57 min and 40 min    Short Term Goals: Week 1:  OT Short Term Goal 1 (Week 1): LTG=STG 2/2 estimated LOS  Skilled Therapeutic Interventions/Progress Updates:    Session 1: Upon entering the room, pt supine in bed sleeping but agreeable to OT intervention. Pt with no c/o pain this session. He declined bathing and dressing. Pt ambulated to sink with supervision and RW for grooming activities while standing. Pt ambulating from room to day room with supervision and cues for navigation with RW. Pt seated for breakfast and OT educating pt on plan for home safety evaluation next week, OT goals, and progress towards upcoming discharge. Pt verbalized he was nervous about returning home with RW and thought the home evaluation would be helpful. Pt returning to room in same manner but needing min A for safety as hallway busy and pt needing assistance to keep him safe. Pt returned to bed at end of session with call bell and all needed items within reach upon exiting the room.   Session 2: Upon entering the room, pt supine in bed. Pt performed supine >sit with supervision. Pt transferred from bed >wheelchair with stand pivot and close supervision without AD. OT propelled pt to tub room for energy conservation. Pt given verbal cues for navigation into bathroom with use of RW and supervision. Pt utilizing grab bars as he has in home environment to step into and over tub ledge with min A for balance and safety. OT discussing TTB as potential safer option at discharge. Pt ambulated back to room 150' with RW and supervision with navigation cues. Pt seated in wheelchair at end of session in wheelchair with call bell and all needed items within reach.  Therapy Documentation Precautions:   Precautions Precautions: Fall Precaution Comments: pt is blind, reliance on assistive device Restrictions Weight Bearing Restrictions: No General:   Vital Signs:  Pain: Pain Assessment Pain Assessment: 0-10 Pain Score: 2  Pain Type: Acute pain Pain Location: Back Pain Orientation: Lower Pain Descriptors / Indicators: Aching Pain Onset: Gradual Patients Stated Pain Goal: 0 Pain Intervention(s): Repositioned;Heat applied ADL: ADL ADL Comments: Please see functional navigator Exercises:   Other Treatments:    See Function Navigator for Current Functional Status.   Therapy/Group: Individual Therapy  Gypsy Decant 03/22/2016, 10:19 AM

## 2016-03-22 NOTE — Progress Notes (Signed)
Physical Therapy Note  Patient Details  Name: Caleb Macaraeg Sr. MRN: [id number] Date of Birth: 23-Sep-1940 Today's Date: 03/22/2016    Time: 607-371 28 minutes  1:1 No c/o pain. Pt performed gait with RW with close supervision, cues only for navigation, on a variety of surfaces 150', 200'.  Pt with more Lt toe drag when fatigued, pt able to recognize but requires cues to correct.  Gait with 1 HHA to mimic cane that pt used at home. Pt requires min A for gait with HHA, fatigues more quickly than with RW.   Furniture transfers to couch and recliner with supervision.   Shakema Surita 03/22/2016, 8:59 AM

## 2016-03-23 ENCOUNTER — Inpatient Hospital Stay (HOSPITAL_COMMUNITY): Payer: Medicare HMO | Admitting: Occupational Therapy

## 2016-03-23 ENCOUNTER — Inpatient Hospital Stay (HOSPITAL_COMMUNITY): Payer: Medicare HMO

## 2016-03-23 DIAGNOSIS — N179 Acute kidney failure, unspecified: Secondary | ICD-10-CM

## 2016-03-23 LAB — CBC WITH DIFFERENTIAL/PLATELET
BASOS ABS: 0 10*3/uL (ref 0.0–0.1)
BASOS PCT: 0 %
EOS ABS: 0.2 10*3/uL (ref 0.0–0.7)
Eosinophils Relative: 2 %
HCT: 39.3 % (ref 39.0–52.0)
HEMOGLOBIN: 13.1 g/dL (ref 13.0–17.0)
Lymphocytes Relative: 41 %
Lymphs Abs: 3.3 10*3/uL (ref 0.7–4.0)
MCH: 27.5 pg (ref 26.0–34.0)
MCHC: 33.3 g/dL (ref 30.0–36.0)
MCV: 82.6 fL (ref 78.0–100.0)
Monocytes Absolute: 0.6 10*3/uL (ref 0.1–1.0)
Monocytes Relative: 8 %
NEUTROS PCT: 49 %
Neutro Abs: 3.9 10*3/uL (ref 1.7–7.7)
Platelets: 201 10*3/uL (ref 150–400)
RBC: 4.76 MIL/uL (ref 4.22–5.81)
RDW: 13.9 % (ref 11.5–15.5)
WBC: 7.9 10*3/uL (ref 4.0–10.5)

## 2016-03-23 LAB — BASIC METABOLIC PANEL
ANION GAP: 7 (ref 5–15)
BUN: 38 mg/dL — ABNORMAL HIGH (ref 6–20)
CHLORIDE: 108 mmol/L (ref 101–111)
CO2: 22 mmol/L (ref 22–32)
Calcium: 8.9 mg/dL (ref 8.9–10.3)
Creatinine, Ser: 2.36 mg/dL — ABNORMAL HIGH (ref 0.61–1.24)
GFR calc non Af Amer: 25 mL/min — ABNORMAL LOW (ref >60)
GFR, EST AFRICAN AMERICAN: 29 mL/min — AB (ref >60)
Glucose, Bld: 112 mg/dL — ABNORMAL HIGH (ref 65–99)
POTASSIUM: 4.1 mmol/L (ref 3.5–5.1)
SODIUM: 137 mmol/L (ref 135–145)

## 2016-03-23 LAB — GLUCOSE, CAPILLARY
GLUCOSE-CAPILLARY: 100 mg/dL — AB (ref 65–99)
GLUCOSE-CAPILLARY: 154 mg/dL — AB (ref 65–99)
GLUCOSE-CAPILLARY: 108 mg/dL — AB (ref 65–99)
Glucose-Capillary: 108 mg/dL — ABNORMAL HIGH (ref 65–99)

## 2016-03-23 MED ORDER — SODIUM CHLORIDE 0.9 % IV SOLN
INTRAVENOUS | Status: AC
  Administered 2016-03-23: 18:00:00 via INTRAVENOUS

## 2016-03-23 MED ORDER — HYDRALAZINE HCL 10 MG PO TABS
10.0000 mg | ORAL_TABLET | Freq: Every day | ORAL | Status: DC
  Administered 2016-03-23 – 2016-03-26 (×4): 10 mg via ORAL
  Filled 2016-03-23 (×4): qty 1

## 2016-03-23 MED ORDER — SODIUM CHLORIDE 0.9 % IV SOLN: INTRAVENOUS | Status: DC

## 2016-03-23 NOTE — Progress Notes (Addendum)
Occupational Therapy Session Note  Patient Details  Name: Caleb Essner Sr. MRN: [id number] Date of Birth: [birth year]  Today's Date: 03/23/2016 OT Individual [phone] and 7858-8502 OT Individual Time Calculation (min): 60 min and 44 min    Skilled Therapeutic Interventions/Progress Updates: Pt was sitting EOB with RN present at time of arrival, agreeable to session. Tx focus on ADL retraining, functional ambulation, and low vision strategies. Pt practiced gathering ADL items in dresser/closet setup to simulate home. Pt required max cues to avoid environmental barriers. Per pt, there were no chairs in front of dresser and closet at home like there was in his hospital room. Pt ambulated into bathroom with RW. He required min guard-supervision and directional cues.  Bathing was completed while seated on tub bench with overall supervision. He was able to wash his back with a towel (his method at home). Dressing was then completed at EOB with supervision for safety and questioning cues for memory (he forgot his clothes were draped over walker). Oral care was completed in standing with RW with cues for safe DME placement. Afterwards pt ambulated to recliner and was left with all needs within reach.   2nd Session 1:1 tx (44 min) Pt was sitting EOB eating lunch at time of arrival. He required min location cues to find food items. Discussed meal situation at home. Per pt, he does not cook in the kitchen. His children provide meals and he sometimes orders meals to be delivered. Pt reported that IADLs are taken care of mostly for him, though he still irons his church clothing. Therefore tx focus was on safe completion of this IADL task, as well as balance and activity tolerance. He ambulated with SPC and min guard to therapy apartment with 1 rest break in between. While in apartment, pt completed sit<stand from low couch with supervision and engaged in task of ironing pillowcases. Pt educated on using  tactile modifications in order to adjust temperature of iron at home (per pt, he "guesses the temperature"). Pt also educated on energy conservation techniques he could implement to decrease activity demands. Afterwards pt ambulated back to room in manner as written above, 1 small LOB while opening door in hallway with therapist assist. Once back in room he was left in recliner with all needs within reach.      Therapy Documentation Precautions:  Precautions Precautions: Fall Precaution Comments: pt is blind, reliance on assistive device Restrictions Weight Bearing Restrictions: No General:   Vital Signs: Therapy Vitals Temp: 97.8 F (36.6 C) Temp Source: Oral Pulse Rate: 67 Resp: 18 BP: 120/61 Patient Position (if appropriate): Lying Oxygen Therapy SpO2: 100 % O2 Device: Not Delivered Pain: No c/o pain during session    ADL: ADL ADL Comments: Please see functional navigator    See Function Navigator for Current Functional Status.   Therapy/Group: Individual Therapy  Kadin Canipe A Dany Walther 03/23/2016, 3:59 PM

## 2016-03-23 NOTE — Progress Notes (Signed)
Occupational Therapy Session Note  Patient Details  Name: Caleb Marsalis Sr. MRN: [id number] Date of Birth: [birth year]  Today's Date: 03/23/2016 OT Individual Time: 0165-5374 OT Individual Time Calculation (min): 27 min    Short Term Goals: Week 1:  OT Short Term Goal 1 (Week 1): LTG=STG 2/2 estimated LOS  Skilled Therapeutic Interventions/Progress Updates:    Treatment session with focus on d/c planning and overall strengthening.  Pt received supine in bed reporting having just returned from PT session and requesting to stay in room.  Engaged in 2 sets of 10 PNF pattern reaching with standard ball, then 2 sets of 10 4# medicine ball in sitting.  Followed by 1 set of 10 in standing with 4# medicine ball.  Discussed functional carryover of movement.  Engaged in discussion of energy conservation strategies with functional tasks to improved overall activity tolerance and completion of functional tasks.  Therapy Documentation Precautions:  Precautions Precautions: Fall Precaution Comments: pt is blind, reliance on assistive device Restrictions Weight Bearing Restrictions: No General:   Vital Signs:  Pain: Pain Assessment Pain Assessment: 0-10 Pain Score: 6  Pain Type: Chronic pain Pain Location: Back Pain Orientation: Lower Pain Descriptors / Indicators: Aching;Dull Pain Frequency: Intermittent Pain Intervention(s): Medication (See eMAR)  See Function Navigator for Current Functional Status.   Therapy/Group: Individual Therapy  Simonne Come 03/23/2016, 12:29 PM

## 2016-03-23 NOTE — Plan of Care (Signed)
Problem: RH Stairs Goal: LTG Patient will ambulate up and down stairs w/assist (PT) LTG: Patient will ambulate up and down # of stairs with assistance (PT)  Discharged due to not a goal for patient. ABG

## 2016-03-23 NOTE — Progress Notes (Signed)
Physical Therapy Session Note  Patient Details  Name: Caleb Mackowski Sr. MRN: [id number] Date of Birth: 1940-05-10  Today's Date: 03/23/2016 PT Individual Time: 1030-1125 PT Individual Time Calculation (min): 55 min   Short Term Goals: Week 1:  PT Short Term Goal 1 (Week 1): =LTGs due to ELOS  Skilled Therapeutic Interventions/Progress Updates:    Session focused on gait training with SPC to progress gait in controlled environment, home environment, and in simulated community environment on ramp and mulched surface. Pt required overall min assist for balance using SPC especially during turns with verbal and tactile cues for obstacle negotiation due to impaired vision. Transfers addressed throughout session with supervision to min assist. Discussed plan for possible home evaluation next week and goals to address. nustep x 10 min for endurance and strengthening on level 4 with rest breaks as needed. Focused on functional endurance throughout.  Therapy Documentation Precautions:  Precautions Precautions: Fall Precaution Comments: pt is blind, reliance on assistive device Restrictions Weight Bearing Restrictions: No   Pain:  Denies pain.   See Function Navigator for Current Functional Status.   Therapy/Group: Individual Therapy  Canary Brim Ivory Broad, PT, DPT  03/23/2016, 1:03 PM

## 2016-03-23 NOTE — Progress Notes (Signed)
Physical Therapy Weekly Progress Note  Patient Details  Name: Caleb Hilyard Sr. MRN: [id number] Date of Birth: 1941/01/17  Beginning of progress report period: March 17, 2016 End of progress report period: March 23, 2016  Today's Date: 03/23/2016   Patient has met 0 of 9 long term goals. STGs not set due to initial ELOS. Currently the plan is to d/c home on 3/9 with pending home evaluation to occur next week to address functional mobility in patient's true environment and assess overall safety. Pt is blind and pt's hospital room has been modified to simulate his home environment as much as possible. Progressing gait with SPC which is the device patient used prior to admission. Pt requires min assist with SPC for mobility.   Patient continues to demonstrate the following deficits muscle weakness and muscle joint tightness, decreased cardiorespiratoy endurance, decreased visual acuity and decreased visual perceptual skills,   and decreased standing balance, decreased postural control and decreased balance strategies and therefore will continue to benefit from skilled PT intervention to increase functional independence with mobility.  Patient progressing toward long term goals..  Continue plan of care. Discharged stair goal as this is not a goal for patient.  PT Short Term Goals Week 1:  PT Short Term Goal 1 (Week 1): =LTGs due to ELOS Week 2:  PT Short Term Goal 1 (Week 2): = LTGs  Skilled Therapeutic Interventions/Progress Updates:  Ambulation/gait training;Community reintegration;DME/adaptive equipment instruction;Neuromuscular re-education;Psychosocial support;Stair training;UE/LE Strength taining/ROM;UE/LE Coordination activities;Therapeutic Activities;Balance/vestibular training;Discharge planning;Cognitive remediation/compensation;Functional mobility training;Patient/family education;Splinting/orthotics;Therapeutic Exercise;Visual/perceptual remediation/compensation;Pain  management;Disease management/prevention;Skin care/wound management   Therapy Documentation Precautions:  Precautions Precautions: Fall Precaution Comments: pt is blind, reliance on assistive device Restrictions Weight Bearing Restrictions: No Pain:    See Function Navigator for Current Functional Status.  Therapy/Group: Individual Therapy  Caleb Dunn, PT, DPT  03/23/2016, 1:07 PM

## 2016-03-23 NOTE — Progress Notes (Signed)
Physical Therapy Session Note  Patient Details  Name: Wiley Magan Sr. MRN: [id number] Date of Birth: 01-05-1941  Today's Date: 03/23/2016 PT Individual Time:1305-1400   PT Individual Time Calculation: 79mn   Short Term Goals: Week 1:  PT Short Term Goal 1 (Week 1): =LTGs due to ELOS  Skilled Therapeutic Interventions/Progress Updates:   Pt received sitting EOB and agreeable to PT.   [facility]. PT transported pt to rehab gym for time management. Gait training with RW x 855fand supervision assist. Additional gait training with SPC x [id number] min assist from PT to guide paitent with free hand. Inconsistent cane placement and mild LOB x 2; no additional support to correct. Nustep endurance training for 2 bouts of 5 minutes x 4>5 and mild cues for proper speed throughout.   Patient returned to room and performed stand pivot transfer with SPC [facility] min assist to bed. Sit>supine with supervision assist from PT. Patient left supine in bed with call bell in reach and all needs met.       Therapy Documentation Precautions:  Precautions Precautions: Fall Precaution Comments: pt is blind, reliance on assistive device Restrictions Weight Bearing Restrictions: No    Pain: 0/10   See Function Navigator for Current Functional Status.   Therapy/Group: Individual Therapy  AustLorie Phenix/2018, 6:00 AM

## 2016-03-23 NOTE — Progress Notes (Signed)
Social Work Patient ID: Caleb Nim Sr., male   DOB: 12/17/1940, 76 y.o.   MRN: [id number]  Met with pt and son yesterday to review team conference.  Both aware and agreeable with targeted d/c date of 3/9. Discussed concerns about safety in his home environment due to new balance issues  (blindness also makes using RW difficult).  Pt and son understand the concerns.  Pt very motivated to reach a mod ind level and be ready to d/c sooner.  Will continue to follow for support and d/c planning needs.  Mieka Leaton, LCSW

## 2016-03-23 NOTE — Progress Notes (Signed)
[facility] PHYSICAL MEDICINE & REHABILITATION     PROGRESS NOTE  Subjective/Complaints:  Pt seen sitting up at the edge of the bed eating breakfast.  He slept well overnight.    ROS: Denies CP, SOB, N/V/D.  Objective: Vital Signs: Blood pressure (!) 158/66, pulse 74, temperature 97.9 F (36.6 C), temperature source Oral, resp. rate 19, weight 89.7 kg (197 lb 11.2 oz), SpO2 100 %. No results found.  Recent Labs  03/23/16 0731  WBC 7.9  HGB 13.1  HCT 39.3  PLT 201    Recent Labs  03/23/16 0731  NA 137  K 4.1  CL 108  GLUCOSE 112*  BUN 38*  CREATININE 2.36*  CALCIUM 8.9   CBG (last 3)   Recent Labs  03/22/16 1649 03/22/16 2240 03/23/16 0639  GLUCAP 192* 140* 100*    Wt Readings from Last 3 Encounters:  03/21/16 89.7 kg (197 lb 11.2 oz)  03/13/16 99.8 kg (220 lb)  01/03/16 91.6 kg (202 lb)    Physical Exam:  BP (!) 158/66 (BP Location: Right Arm)   Pulse 74   Temp 97.9 F (36.6 C) (Oral)   Resp 19   Wt 89.7 kg (197 lb 11.2 oz)   SpO2 100%   BMI 31.91 kg/m  Constitutional: No distress. Vital signs reviewed. Well-developed.  HENT: Normocephalicand atraumatic.  Eyes: Blind  Cardiovascular: RRR. No JVD. Respiratory: Effort normal breath sounds normal.  GI: Soft. Bowel sounds are normal.  Musculoskeletal: He exhibits no edema. No tenderness.  Neurological: He is alertand oriented x3.  Follows commands. Fair awareness and insight. Right central 7.  Motor: RUE 4+-5/5 deltoid, biceps, triceps, wrist, hand. LUE 5/5 (stable).  RLE 4+-5/5 HF, 4/5 KE and ADF/PF.  LLE 5/5.  Skin: Warm and dry. Intact.  Psychiatric: He has a normal mood and affect. His behavior is normal.    Assessment/Plan: 1. 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 Physiatrist is providing close team supervision and 24 hour management of active medical problems  listed below. Physiatrist and rehab team continue to assess barriers to discharge/monitor patient progress toward functional and medical goals.  Function:  Bathing Bathing position   Position: Shower  Bathing parts Body parts bathed by patient: Right arm, Left arm, Chest, Abdomen, Front perineal area, Buttocks, Right upper leg, Left upper leg, Right lower leg, Left lower leg Body parts bathed by helper: Back  Bathing assist Assist Level: Touching or steadying assistance(Pt > 75%)      Upper Body Dressing/Undressing Upper body dressing   What is the patient wearing?: Pull over shirt/dress     Pull over shirt/dress - Perfomed by patient: Thread/unthread left sleeve, Put head through opening, Pull shirt over trunk, Thread/unthread right sleeve Pull over shirt/dress - Perfomed by helper: Thread/unthread right sleeve        Upper body assist Assist Level: Supervision or verbal cues      Lower Body Dressing/Undressing Lower body dressing   What is the patient wearing?: Underwear, Pants, Non-skid slipper socks Underwear - Performed by patient: Thread/unthread right underwear leg, Thread/unthread left underwear leg, Pull underwear up/down   Pants- Performed by patient: Thread/unthread right pants leg, Thread/unthread left pants leg, Pull pants up/down, Fasten/unfasten pants Pants- Performed by helper: Thread/unthread right pants leg Non-skid slipper socks- Performed by patient: Don/doff right sock, Don/doff left sock   Socks - Performed by patient: Don/doff left sock, Don/doff right sock  Shoes - Performed by patient: Don/doff left shoe, Don/doff right shoe            Lower body assist Assist for lower body dressing: Supervision or verbal cues      Toileting Toileting Toileting activity did not occur: No continent bowel/bladder event Toileting steps completed by patient: (P) Adjust clothing prior to toileting, Performs perineal hygiene, Adjust clothing after  toileting Toileting steps completed by helper: Performs perineal hygiene, Adjust clothing prior to toileting, Adjust clothing after toileting Toileting Assistive Devices: (P) Grab bar or rail  Toileting assist Assist level: (P) Supervision or verbal cues   Transfers Chair/bed transfer   Chair/bed transfer method: Ambulatory Chair/bed transfer assist level: Supervision or verbal cues Chair/bed transfer assistive device: Armrests, Medical sales representative     Max distance: 75 Assist level: Touching or steadying assistance (Pt > 75%)   Wheelchair Wheelchair activity did not occur: N/A        Cognition Comprehension Comprehension assist level: Follows complex conversation/direction with extra time/assistive device  Expression Expression assist level: Expresses complex ideas: With extra time/assistive device  Social Interaction Social Interaction assist level: Interacts appropriately with others - No medications needed.  Problem Solving Problem solving assist level: Solves basic 90% of the time/requires cueing < 10% of the time  Memory Memory assist level: Recognizes or recalls 90% of the time/requires cueing < 10% of the time    Medical Problem List and Plan: 1. 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 DVT Prophylaxis/Anticoagulation: SCDs. Monitor for any signs of DVT 3. Pain Management: Tylenol as needed 4. Mood: Provide emotional support. Continue Prozac 20 mg daily. 5. Neuropsych: This patient iscapable of making decisions on hisown behalf. 6. Skin/Wound Care: Routine skin checks 7. Fluids/Electrolytes/Nutrition: Routine I&Os  education regarding appropriate food choices 8.Legally blind. Patient used a cane prior to admission was able to navigate about his apartment 9.Hypertension.   Cont Toprol-XL 75 mg daily.   Orthostatics negative  Cozaar 100 mg changed to qhs 2/28  Hydralazine 10 qhs started 3/2  Remains  elevated in the morning 10.Diabetes mellitus peripheral neuropathy. Hemoglobin A1c 7.2. Trandjenta 5 mg daily. Check blood sugars before meals and at bedtime. Diabetic teaching appropriate.  Overall controlled 3/2 11.CAD. Continue aspirin therapy. No chest pain or shortness of breath 12.BPH.Proscar 5mg  daily.   PVR 3 negative 13.Hyperlipidemia. Pravachol 20 mg daily 14.Constipation.linzess 290 mcg daily, Miralax daily 15. CKD III  With AKI, Cr 2.36 on 3/2  IVF qhs started 3/2  Labs ordered for Monday  Cont to monitor  Encourage adequate po intake   LOS (Days) 7 A FACE TO FACE EVALUATION WAS PERFORMED  Skarlett Sedlacek Lorie Phenix 03/23/2016 9:52 AM

## 2016-03-23 NOTE — Patient Care Conference (Signed)
Inpatient RehabilitationTeam Conference and Plan of Care Update Date: 03/21/16   Time: 2:40 PM    Patient Name: Caleb Barretta Sr.      Medical Record Number: [id number]  Date of Birth: October 07, 1940 Sex: Male         Room/Bed: 4W17C/4W17C-01 Payor Info: Payor: [employer] MEDICARE / Plan: [employer] MEDICARE HMO / Product Type: *No Product type* /    Admitting Diagnosis: L CVA  Admit Date/Time:  03/16/2016  4:10 PM Admission Comments: No comment available   Primary Diagnosis:  Parietal lobe infarction [facility]) Principal Problem: Parietal lobe infarction [facility])  Patient Active Problem List   Diagnosis Date Noted  . AKI (acute kidney injury) ([place])   . Type 2 diabetes mellitus with peripheral neuropathy (HCC)   . Labile blood pressure   . Stage 3 chronic kidney disease   . Benign essential HTN   . Blind   . Parietal lobe infarction ([place]) 03/16/2016  . Acute cerebrovascular accident (CVA) ([place]) 03/13/2016  . Essential hypertension 03/13/2016  . Pruritus 01/03/2016  . UTI (urinary tract infection) 10/27/2015  . Impacted cerumen of left ear 10/03/2015  . Sore throat in the morning 09/23/2015  . Situational mixed anxiety and depressive disorder 06/17/2015  . Neuropathic pain 03/09/2015  . Insomnia 03/09/2015  . Left arm weakness 08/13/2014  . Herpes zoster 07/13/2014  . Constipation 07/13/2014  . Epididymitis 02/23/2014  . Pain in joint, ankle and foot 08/04/2013  . Dry mouth 05/05/2013  . 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 Dizziness 11/01/2011  . Obesity (BMI 30-39.9)   . Chronic kidney disease (CKD), stage IV (severe) ([place])   . Elevated PSA 08/17/2011  . Bladder neck obstruction 04/13/2011  . DM (diabetes mellitus), type 2 with ophthalmic complications ([place])   . Glaucoma associated with ocular disorder   . Carpal tunnel sundrome   . Erectile dysfunction   . GERD   . Cervical disc disorder with radiculopathy   . Hyperlipidemia   . Cholelithiases   . Hypertensive heart disease   .  Coronary atherosclerosis     Expected Discharge Date: Expected Discharge Date: 03/30/16  Team Members Present: Physician leading conference: Dr. Delice Lesch Social Worker Present: Lennart Pall, LCSW Nurse Present: Dorien Chihuahua, RN PT Present: Jorge Mandril, PT;Other (comment) (Rosita Dechalus, PTA) OT Present: Benay Pillow, OT SLP Present: Weston Anna, SLP PPS Coordinator present : Daiva Nakayama, RN, CRRN     Current Status/Progress Goal Weekly Team Focus  Medical    Right hemiparesis and balance deficits secondary to left parietal infarct with history of blindness  Improve mobility, safety, CKD  See above   Bowel/Bladder   Pt is continent of bowel/bladder, LBM 03/18/16  min assist/continent with bowel and bladder  continue plan of care   Swallow/Nutrition/ Hydration             ADL's    min A LB self care, bathing, and functional transfers. Supervision for all other tasks.  goals downgraded 2/28 Mod I for grooming,UB self care, and sitting balance with supervision for all other tasks  self care, endurance, functional transfers, strengthening   Mobility   supervision bed mobilty, minA f(or direction) mobilty with RW 176ft   Supervision/mod I functional mobilty  gait, endurance, transfers   Communication             Safety/Cognition/ Behavioral Observations            Pain             Skin  skin is intact  skin will remain intact  continue plan of care    Rehab Goals Patient on target to meet rehab goals: Yes *See Care Plan and progress notes for long and short-term goals.  Barriers to Discharge: Mobility, safety, BPH, CKD    Possible Resolutions to Barriers:  Therapies, adaptive equip, optimize HTN/DM meds, follow labs    Discharge Planning/Teaching Needs:  [place] with [place] assistance of family  Teaching to be done prior to d/c   Team Discussion:  DM, BP, BPH monitoring.  Multiple chronic health issues.  Min assist ADLs  and supervision toileting.  Balance a concern but improving.  Need to encourage pt OOB.  Concern about safety, however, blindness and unfamiliarity with hospital room/ setting making it difficult to truly determine if can reach mod ind level of function.  Will try and set up room similar to room in apt.  May need to do home eval but try to avoid.    Revisions to Treatment Plan:  None   Continued Need for Acute Rehabilitation Level of Care: The patient requires daily medical management by a physician with specialized training in physical medicine and rehabilitation for the following conditions: Daily direction of a multidisciplinary physical rehabilitation program to ensure safe treatment while eliciting the highest outcome that is of practical value to the patient.: Yes Daily medical management of patient stability for increased activity during participation in an intensive rehabilitation regime.: Yes Daily analysis of laboratory values and/or radiology reports with any subsequent need for medication adjustment of medical intervention for : Neurological problems;Blood pressure problems;Diabetes problems;Urological problems;Renal problems  Ardyce Heyer 03/23/2016, 2:32 PM

## 2016-03-24 ENCOUNTER — Inpatient Hospital Stay (HOSPITAL_COMMUNITY): Payer: Medicare HMO | Admitting: *Deleted

## 2016-03-24 ENCOUNTER — Inpatient Hospital Stay (HOSPITAL_COMMUNITY): Payer: Medicare HMO | Admitting: Occupational Therapy

## 2016-03-24 DIAGNOSIS — N179 Acute kidney failure, unspecified: Secondary | ICD-10-CM

## 2016-03-24 LAB — GLUCOSE, CAPILLARY
GLUCOSE-CAPILLARY: 112 mg/dL — AB (ref 65–99)
GLUCOSE-CAPILLARY: 164 mg/dL — AB (ref 65–99)
Glucose-Capillary: 108 mg/dL — ABNORMAL HIGH (ref 65–99)
Glucose-Capillary: 108 mg/dL — ABNORMAL HIGH (ref 65–99)

## 2016-03-24 NOTE — Progress Notes (Signed)
Physical Therapy Session Note  Patient Details  Name: Caleb Lacomb Sr. MRN: [id number] Date of Birth: 1940-05-31  Today's Date: 03/24/2016 PT Individual Time: 1300-1400 PT Individual Time Calculation (min): 60 min     Skilled Therapeutic Interventions/Progress Updates:  Patient in room, needs assistance in getting out of the bathroom, min guard to assist with path finding. Patient agrees to therapy intervention, no complains of pain or discomfort.  Training in gait with SPC to therapy gym with min guard,  Slight L  side lean.  NuStep x 10 min with one resting break ,resistance level 4 to facilitate strength and conditioning.  Sit to stand w/o UE support, holding a cane in front and raise above head when standing.  Standing with core twisting 2 x 12. ,B shoulder extension with holding cane behind back, no support for standing, increased verbal cues for postural corrections especially in posterior direction.  Marching in place with min to supervision due to LOB, able to recover but needs cues to assure proper posture and weight shift.   Four square stepping with 5 lbs on B ankles for balance and strength training.  Training in getting in and out of recliner and gait on carpet as part of home environment training.  Patient returned to room, left in bed with alarm on.  Therapy Documentation Precautions:  Precautions Precautions: Fall Precaution Comments: pt is blind, reliance on assistive device Restrictions Weight Bearing Restrictions: No    Therapy/Group: Individual Therapy  Guadlupe Spanish 03/24/2016, 3:33 PM

## 2016-03-24 NOTE — Progress Notes (Signed)
Occupational Therapy Session Note  Patient Details  Name: Caleb Dunn. MRN: [id number] Date of Birth: 26-Feb-1940  Today's Date: 03/24/2016 OT Individual Time: 1115-1200 OT Individual Time Calculation (min): 45 min    Short Term Goals: Week 1:  OT Short Term Goal 1 (Week 1): LTG=STG 2/2 estimated LOS  Skilled Therapeutic Interventions/Progress Updates:    Upon entering the room, pt supine in bed with no c/o pain. Pt ambulating with SPC to sink for grooming tasks with verbal cues to locate items on sink side. Pt performed tasks in standing with overall supervision. Pt ambulating 100' to day room with [facility] and steady assistance. Pt having difficulty maintaining straight navigation. Pt having 2 LOB this session requiring min - mod A to correct when making wide turn in hallway and feet appearing to scissor. OT demonstrated B UE strengthening exercises with use of 4 lbs dowel rod. Pt returned demonstration with multimodal cues for proper technique. Pt preforming 2 sets of 15 forwards rows, reverse rows, straight arm raises, chest press, and bicep curls. Pt returned to room in same manner as above. Pt supine in bed with call bell and all needed items within reach upon exiting the room.   Therapy Documentation Precautions:  Precautions Precautions: Fall Precaution Comments: pt is blind, reliance on assistive device Restrictions Weight Bearing Restrictions: No General:   Vital Signs: Therapy Vitals Temp: 98.9 F (37.2 C) Temp Source: Oral Pulse Rate: 62 Resp: 18 BP: 134/71 Patient Position (if appropriate): Lying Oxygen Therapy SpO2: 99 % O2 Device: Not Delivered Pain:   ADL: ADL ADL Comments: Please see functional navigator Exercises:   Other Treatments:    See Function Navigator for Current Functional Status.   Therapy/Group: Individual Therapy  Gypsy Decant 03/24/2016, 4:30 PM

## 2016-03-24 NOTE — Progress Notes (Signed)
[place] PHYSICAL MEDICINE & REHABILITATION     PROGRESS NOTE  Subjective/Complaints:  Pt seen laying in bed this AM.  He slept well overnight.  He is Patent attorney.    ROS: Denies CP, SOB, N/V/D.  Objective: Vital Signs: Blood pressure 134/71, pulse 62, temperature 98.9 F (37.2 C), temperature source Oral, resp. rate 18, weight 89.7 kg (197 lb 11.2 oz), SpO2 99 %. No results found.  Recent Labs  03/23/16 0731  WBC 7.9  HGB 13.1  HCT 39.3  PLT 201    Recent Labs  03/23/16 0731  NA 137  K 4.1  CL 108  GLUCOSE 112*  BUN 38*  CREATININE 2.36*  CALCIUM 8.9   CBG (last 3)   Recent Labs  03/24/16 0656 03/24/16 1158 03/24/16 1641  GLUCAP 108* 112* 164*    Wt Readings from Last 3 Encounters:  03/21/16 89.7 kg (197 lb 11.2 oz)  03/13/16 99.8 kg (220 lb)  01/03/16 91.6 kg (202 lb)    Physical Exam:  BP 134/71 (BP Location: Right Arm)   Pulse 62   Temp 98.9 F (37.2 C) (Oral)   Resp 18   Wt 89.7 kg (197 lb 11.2 oz)   SpO2 99%   BMI 31.91 kg/m  Constitutional: No distress. Vital signs reviewed. Well-developed.  HENT: Normocephalicand atraumatic.  Eyes: Blind  Cardiovascular: RRR. No JVD. Respiratory: Effort normal breath sounds normal.  GI: Soft. Bowel sounds are normal.  Musculoskeletal: He exhibits no edema. No tenderness.  Neurological: He is alertand oriented x3.  Follows commands. Fair awareness and insight. Right central 7.  Motor: RUE 4+-5/5 deltoid, biceps, triceps, wrist, hand. LUE 5/5 (unchanged).  RLE 4+-5/5 HF, 4/5 KE and ADF/PF.  LLE 5/5.  Skin: Warm and dry. Intact.  Psychiatric: He has a normal mood and affect. His behavior is normal.    Assessment/Plan: 1. 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 Physiatrist is providing close team supervision and 24 hour management of active medical problems listed  below. Physiatrist and rehab team continue to assess barriers to discharge/monitor patient progress toward functional and medical goals.  Function:  Bathing Bathing position   Position: Shower  Bathing parts Body parts bathed by patient: Right arm, Left arm, Chest, Abdomen, Front perineal area, Buttocks, Right upper leg, Left upper leg, Right lower leg, Left lower leg, Back (Back washed by pt with towel, per method at home) Body parts bathed by helper: Back  Bathing assist Assist Level: Supervision or verbal cues      Upper Body Dressing/Undressing Upper body dressing   What is the patient wearing?: Pull over shirt/dress     Pull over shirt/dress - Perfomed by patient: Thread/unthread left sleeve, Put head through opening, Pull shirt over trunk, Thread/unthread right sleeve Pull over shirt/dress - Perfomed by helper: Thread/unthread right sleeve        Upper body assist Assist Level: Supervision or verbal cues      Lower Body Dressing/Undressing Lower body dressing   What is the patient wearing?: Underwear, Pants, Non-skid slipper socks Underwear - Performed by patient: Thread/unthread right underwear leg, Thread/unthread left underwear leg, Pull underwear up/down   Pants- Performed by patient: Thread/unthread right pants leg, Thread/unthread left pants leg, Pull pants up/down, Fasten/unfasten pants Pants- Performed by helper: Thread/unthread right pants leg Non-skid slipper socks- Performed by patient: Don/doff right sock, Don/doff left sock   Socks - Performed by patient: Don/doff  left sock, Don/doff right sock   Shoes - Performed by patient: Don/doff left shoe, Don/doff right shoe            Lower body assist Assist for lower body dressing: Supervision or verbal cues      Toileting Toileting Toileting activity did not occur: No continent bowel/bladder event Toileting steps completed by patient: Adjust clothing prior to toileting, Adjust clothing after  toileting Toileting steps completed by helper: Performs perineal hygiene Toileting Assistive Devices: Grab bar or rail  Toileting assist Assist level: (P) Supervision or verbal cues   Transfers Chair/bed transfer   Chair/bed transfer method: Ambulatory Chair/bed transfer assist level: Touching or steadying assistance (Pt > 75%) Chair/bed transfer assistive device: Librarian, academic     Max distance: 100 Assist level: Touching or steadying assistance (Pt > 75%)   Wheelchair Wheelchair activity did not occur: N/A        Cognition Comprehension Comprehension assist level: Follows complex conversation/direction with extra time/assistive device  Expression Expression assist level: Expresses complex ideas: With extra time/assistive device  Social Interaction Social Interaction assist level: Interacts appropriately with others - No medications needed.  Problem Solving Problem solving assist level: Solves basic 90% of the time/requires cueing < 10% of the time  Memory Memory assist level: Recognizes or recalls 90% of the time/requires cueing < 10% of the time    Medical Problem List and Plan: 1. 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 DVT Prophylaxis/Anticoagulation: SCDs. Monitor for any signs of DVT 3. Pain Management: Tylenol as needed 4. Mood: Provide emotional support. Continue Prozac 20 mg daily. 5. Neuropsych: This patient iscapable of making decisions on hisown behalf. 6. Skin/Wound Care: Routine skin checks 7. Fluids/Electrolytes/Nutrition: Routine I&Os  education regarding appropriate food choices 8.Legally blind. Patient used a cane prior to admission was able to navigate about his apartment 9.Hypertension.   Cont Toprol-XL 75 mg daily.   Orthostatics negative  Cozaar 100 mg changed to qhs 2/28  Hydralazine 10 qhs started 3/2  Improving 3/3 10.Diabetes mellitus peripheral neuropathy. Hemoglobin A1c  7.2. Trandjenta 5 mg daily. Check blood sugars before meals and at bedtime. Diabetic teaching appropriate.  Overall controlled 3/3 11.CAD. Continue aspirin therapy. No chest pain or shortness of breath 12.BPH.Proscar 5mg  daily.   PVR 3 negative 13.Hyperlipidemia. Pravachol 20 mg daily 14.Constipation.linzess 290 mcg daily, Miralax daily 15. CKD III  With AKI, Cr 2.36 on 3/2  IVF qhs started 3/2  Labs ordered for Monday  Cont to monitor  Encourage adequate po intake   LOS (Days) 8 A FACE TO FACE EVALUATION WAS PERFORMED  Jameis Newsham Lorie Phenix 03/24/2016 9:01 PM

## 2016-03-25 ENCOUNTER — Inpatient Hospital Stay (HOSPITAL_COMMUNITY): Payer: Medicare HMO | Admitting: Physical Therapy

## 2016-03-25 ENCOUNTER — Inpatient Hospital Stay (HOSPITAL_COMMUNITY): Payer: Medicare HMO

## 2016-03-25 LAB — GLUCOSE, CAPILLARY
GLUCOSE-CAPILLARY: 100 mg/dL — AB (ref 65–99)
Glucose-Capillary: 190 mg/dL — ABNORMAL HIGH (ref 65–99)
Glucose-Capillary: 159 mg/dL — ABNORMAL HIGH (ref 65–99)
Glucose-Capillary: 148 mg/dL — ABNORMAL HIGH (ref 65–99)

## 2016-03-25 NOTE — Progress Notes (Signed)
[facility] PHYSICAL MEDICINE & REHABILITATION     PROGRESS NOTE  Subjective/Complaints:  Pt seen sitting up in his chair this AM.  He slept well overnight. He denies complaints.   ROS: Denies CP, SOB, N/V/D.  Objective: Vital Signs: Blood pressure (!) 173/73, pulse 62, temperature 97.9 F (36.6 C), temperature source Oral, resp. rate 18, weight 89.7 kg (197 lb 11.2 oz), SpO2 100 %. No results found.  Recent Labs  03/23/16 0731  WBC 7.9  HGB 13.1  HCT 39.3  PLT 201    Recent Labs  03/23/16 0731  NA 137  K 4.1  CL 108  GLUCOSE 112*  BUN 38*  CREATININE 2.36*  CALCIUM 8.9   CBG (last 3)   Recent Labs  03/24/16 1641 03/24/16 2126 03/25/16 0636  GLUCAP 164* 108* 100*    Wt Readings from Last 3 Encounters:  03/21/16 89.7 kg (197 lb 11.2 oz)  03/13/16 99.8 kg (220 lb)  01/03/16 91.6 kg (202 lb)    Physical Exam:  BP (!) 173/73 (BP Location: Left Arm)   Pulse 62   Temp 97.9 F (36.6 C) (Oral)   Resp 18   Wt 89.7 kg (197 lb 11.2 oz)   SpO2 100%   BMI 31.91 kg/m  Constitutional: No distress. Vital signs reviewed. Well-developed.  HENT: Normocephalicand atraumatic.  Eyes: Blind  Cardiovascular: RRR. No JVD. Respiratory: Effort normal breath sounds normal.  GI: Soft. Bowel sounds are normal.  Musculoskeletal: He exhibits no edema. No tenderness.  Neurological: He is alertand oriented.  Follows commands. Fair awareness and insight. Right central 7.  Motor: RUE 4+-5/5 deltoid, biceps, triceps, wrist, hand. LUE 5/5 (stable).  RLE 4+-5/5 HF, 4/5 KE and ADF/PF.  LLE 5/5.  Skin: Warm and dry. Intact.  Psychiatric: He has a normal mood and affect. His behavior is normal.    Assessment/Plan: 1. 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 Physiatrist is providing close team supervision and 24 hour management of active medical problems listed  below. Physiatrist and rehab team continue to assess barriers to discharge/monitor patient progress toward functional and medical goals.  Function:  Bathing Bathing position   Position: Shower  Bathing parts Body parts bathed by patient: Right arm, Left arm, Chest, Abdomen, Front perineal area, Buttocks, Right upper leg, Left upper leg, Right lower leg, Left lower leg, Back (Back washed by pt with towel, per method at home) Body parts bathed by helper: Back  Bathing assist Assist Level: Supervision or verbal cues      Upper Body Dressing/Undressing Upper body dressing   What is the patient wearing?: Pull over shirt/dress     Pull over shirt/dress - Perfomed by patient: Thread/unthread left sleeve, Put head through opening, Pull shirt over trunk, Thread/unthread right sleeve Pull over shirt/dress - Perfomed by helper: Thread/unthread right sleeve        Upper body assist Assist Level: Supervision or verbal cues      Lower Body Dressing/Undressing Lower body dressing   What is the patient wearing?: Underwear, Pants, Non-skid slipper socks Underwear - Performed by patient: Thread/unthread right underwear leg, Thread/unthread left underwear leg, Pull underwear up/down   Pants- Performed by patient: Thread/unthread right pants leg, Thread/unthread left pants leg, Pull pants up/down, Fasten/unfasten pants Pants- Performed by helper: Thread/unthread right pants leg Non-skid slipper socks- Performed by patient: Don/doff right sock, Don/doff left sock   Socks - Performed by patient:  Don/doff left sock, Don/doff right sock   Shoes - Performed by patient: Don/doff left shoe, Don/doff right shoe            Lower body assist Assist for lower body dressing: Supervision or verbal cues      Toileting Toileting Toileting activity did not occur: No continent bowel/bladder event Toileting steps completed by patient: Adjust clothing prior to toileting, Performs perineal hygiene, Adjust  clothing after toileting Toileting steps completed by helper: Performs perineal hygiene Toileting Assistive Devices: Grab bar or rail  Toileting assist Assist level: Touching or steadying assistance (Pt.75%)   Transfers Chair/bed transfer   Chair/bed transfer method: Ambulatory Chair/bed transfer assist level: Touching or steadying assistance (Pt > 75%) Chair/bed transfer assistive device: Librarian, academic     Max distance: 100 Assist level: Touching or steadying assistance (Pt > 75%)   Wheelchair Wheelchair activity did not occur: N/A        Cognition Comprehension Comprehension assist level: Follows complex conversation/direction with no assist  Expression Expression assist level: Expresses complex ideas: With no assist  Social Interaction Social Interaction assist level: Interacts appropriately with others - No medications needed.  Problem Solving Problem solving assist level: Solves complex problems: Recognizes & self-corrects  Memory Memory assist level: Complete Independence: No helper    Medical Problem List and Plan: 1. 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 DVT Prophylaxis/Anticoagulation: SCDs. Monitor for any signs of DVT 3. Pain Management: Tylenol as needed 4. Mood: Provide emotional support. Continue Prozac 20 mg daily. 5. Neuropsych: This patient iscapable of making decisions on hisown behalf. 6. Skin/Wound Care: Routine skin checks 7. Fluids/Electrolytes/Nutrition: Routine I&Os  education regarding appropriate food choices 8.Legally blind. Patient used a cane prior to admission was able to navigate about his apartment 9.Hypertension.   Cont Toprol-XL 75 mg daily.   Orthostatics negative  Cozaar 100 mg changed to qhs 2/28  Hydralazine 10 qhs started 3/2  Overall controlled, elevated in last 12 hours, will cont to monitor  10.Diabetes mellitus peripheral neuropathy. Hemoglobin A1c 7.2.  Trandjenta 5 mg daily. Check blood sugars before meals and at bedtime. Diabetic teaching appropriate.  Overall controlled 3/4 11.CAD. Continue aspirin therapy. No chest pain or shortness of breath 12.BPH.Proscar 5mg  daily.   PVR 3 negative 13.Hyperlipidemia. Pravachol 20 mg daily 14.Constipation.linzess 290 mcg daily, Miralax daily 15. CKD III  With AKI, Cr 2.36 on 3/2  IVF qhs started 3/2  Labs ordered for Monday  Cont to monitor  Encourage adequate po intake   LOS (Days) 9 A FACE TO FACE EVALUATION WAS PERFORMED  Caleb Dunn 03/25/2016 9:03 AM

## 2016-03-25 NOTE — Progress Notes (Signed)
Physical Therapy Session Note  Patient Details  Name: Caleb Severin Sr. MRN: [id number] Date of Birth: [birth year]  Today's Date: 03/25/2016 PT Individual Time: 3559-7416 PT Individual Time Calculation (min): 42 min   Short Term Goals: Week 2:  PT Short Term Goal 1 (Week 2): = LTGs  Skilled Therapeutic Interventions/Progress Updates:    Pt received in bed & agreeable to tx. Pt transferred supine>sitting EOB with Mod I & use of bed rails. Therapist provided total assist for w/c set up & pt completed stand pivot with close supervision. In gym, pt negotiated 24 steps (6" + 3") with B rails and steady assist. Pt utilized nu-step up to level 3 x 12 minutes with pt reporting 8/10 RPE; task focused on endurance training. Pt noted B shoulder soreness after task & required a rest break. Pt completed car transfer from low sedan seat height, with supervision for stand pivot transfer. Pt ambulated 76 ft with SPC and close supervision, with occasional Min assist 2/2 LOB. At end of session pt left sitting in w/c with all needs within reach & RN present.   During session, pt able to complete all functional mobility tasks with set up assist and multimodal cuing for chair position, rails, etc. 2/2 impaired vision.   Therapy Documentation Precautions:  Precautions Precautions: Fall Precaution Comments: pt is blind, reliance on assistive device Restrictions Weight Bearing Restrictions: No  Pain: No c/o pain.   See Function Navigator for Current Functional Status.   Therapy/Group: Individual Therapy  Waunita Schooner 03/25/2016, 5:33 PM

## 2016-03-25 NOTE — Progress Notes (Signed)
Occupational Therapy Session Note  Patient Details  Name: Caleb Dunn. MRN: [id number] Date of Birth: Feb 22, 1940  Today's Date: 03/25/2016 OT Individual Time: 0900-1000 OT Individual Time Calculation (min): 60 min   Short Term Goals: Week 1:  OT Short Term Goal 1 (Week 1): LTG=STG 2/2 estimated LOS  Skilled Therapeutic Interventions/Progress Updates: ADL-retraining with focus on functional mobility within room using cane, safety awareness, and endurance.   Pt received sitting at EOB and receptive for BADL at shower level.   With setup to provide supplies and clothing, pt completed functional mobility using cane to shower with contact guard and min explicit vc for safety during transfer to bench.  Pt performed bathing sitting and standing from transfer bench in shower with min vc for safety d/t visual impairment.   Pt returned to room after bathing and dressed sitting and standing while seated in w/c at sink side.   Pt required total asssit to shave but responded to vc appropriately throughout session.  Pt reco     Therapy Documentation Precautions:  Precautions Precautions: Fall Precaution Comments: pt is blind, reliance on assistive device Restrictions Weight Bearing Restrictions: No   Pain: Pain Assessment Pain Score: 0-No pain   ADL: ADL ADL Comments: Please see functional navigator   See Function Navigator for Current Functional Status.   Therapy/Group: Individual Therapy  [place] 03/25/2016, 12:22 PM

## 2016-03-26 ENCOUNTER — Inpatient Hospital Stay (HOSPITAL_COMMUNITY): Payer: Medicare HMO | Admitting: Physical Therapy

## 2016-03-26 ENCOUNTER — Inpatient Hospital Stay (HOSPITAL_COMMUNITY): Payer: Medicare HMO | Admitting: Occupational Therapy

## 2016-03-26 LAB — BASIC METABOLIC PANEL
Anion gap: 7 (ref 5–15)
BUN: 37 mg/dL — ABNORMAL HIGH (ref 6–20)
CALCIUM: 9.2 mg/dL (ref 8.9–10.3)
CHLORIDE: 108 mmol/L (ref 101–111)
CO2: 20 mmol/L — ABNORMAL LOW (ref 22–32)
CREATININE: 2.77 mg/dL — AB (ref 0.61–1.24)
GFR calc non Af Amer: 21 mL/min — ABNORMAL LOW (ref >60)
GFR, EST AFRICAN AMERICAN: 24 mL/min — AB (ref >60)
Glucose, Bld: 180 mg/dL — ABNORMAL HIGH (ref 65–99)
Potassium: 5 mmol/L (ref 3.5–5.1)
SODIUM: 135 mmol/L (ref 135–145)

## 2016-03-26 LAB — CBC WITH DIFFERENTIAL/PLATELET
BASOS ABS: 0 10*3/uL (ref 0.0–0.1)
BASOS PCT: 0 %
EOS ABS: 0.2 10*3/uL (ref 0.0–0.7)
Eosinophils Relative: 2 %
HCT: 39 % (ref 39.0–52.0)
HEMOGLOBIN: 12.8 g/dL — AB (ref 13.0–17.0)
Lymphocytes Relative: 27 %
Lymphs Abs: 2 10*3/uL (ref 0.7–4.0)
MCH: 27.3 pg (ref 26.0–34.0)
MCHC: 32.8 g/dL (ref 30.0–36.0)
MCV: 83.2 fL (ref 78.0–100.0)
Monocytes Absolute: 0.6 10*3/uL (ref 0.1–1.0)
Monocytes Relative: 7 %
NEUTROS PCT: 64 %
Neutro Abs: 4.9 10*3/uL (ref 1.7–7.7)
Platelets: 197 10*3/uL (ref 150–400)
RBC: 4.69 MIL/uL (ref 4.22–5.81)
RDW: 13.9 % (ref 11.5–15.5)
WBC: 7.7 10*3/uL (ref 4.0–10.5)

## 2016-03-26 LAB — GLUCOSE, CAPILLARY
GLUCOSE-CAPILLARY: 116 mg/dL — AB (ref 65–99)
GLUCOSE-CAPILLARY: 171 mg/dL — AB (ref 65–99)
GLUCOSE-CAPILLARY: 120 mg/dL — AB (ref 65–99)
Glucose-Capillary: 162 mg/dL — ABNORMAL HIGH (ref 65–99)

## 2016-03-26 NOTE — Progress Notes (Signed)
Occupational Therapy Weekly Progress Note  Patient Details  Name: Caleb Coolman Sr. MRN: [id number] Date of Birth: 07/21/1940  Beginning of progress report period: March 17, 2016 End of progress report period: March 26, 2016  Today's Date: 03/26/2016 OT Individual Time: 4536-4680 and 1330-1400 OT Individual Time Calculation (min): 58 min and 30 min    Patient has met 3 of 11 long term goals.  Short term goals not set due to estimated length of stay.  Pt has made increased progress this week and is currently requiring overall supervision and cuing for navigation secondary to visual deficits. Pt utilizing [facility] for functional mobility/transfers. Room has been set up to simulate home environment and home safety evaluation set up for this week.  Patient continues to demonstrate the following deficits: muscle weakness, decreased cardiorespiratoy endurance, legally blind and decreased standing balance and decreased balance strategies and therefore will continue to benefit from skilled OT intervention to enhance overall performance with BADL and iADL.  See Patient's Care Plan for progression toward long term goals.  Patient progressing toward long term goals.  Continue plan of care.  Skilled Therapeutic Interventions/Progress Updates:    Session 1:Upon entering the room, pt seated in wheelchair and requesting to wash at sink today. Pt performed sit <>stand with supervision at sink for hygiene. Pt washing self and donning clothing items with overall supervision - set up A to obtain items. Pt utilized [facility] for ambulation to sink with min verbal cues for navigation. Pt seated in wheelchair for B hand strengthening and coordination exercises with use of red theraputty. Pt performed tasks with min verbal cues for proper technique. Call bell and all needed items within reach upon exiting the room.   Session 2: Upon entering the room, pt supine in bed with son present in the room. Pt had soiled self and bed  linen with BM and was unaware. Pt reports he was having stomach cramps and was experiencing loose stool prior to OT session. Pt ambulated with [facility] and supervision with cues for navigation into bathroom to remove clothing and clean self. Pt donned clean clothing items with set up A to to obtain items this session. Pt washing hands at sink with supervision and returning to bed as he did not feel well.   Therapy Documentation Precautions:  Precautions Precautions: Fall Precaution Comments: pt is blind, reliance on assistive device Restrictions Weight Bearing Restrictions: No General: General PT Missed Treatment Reason: Patient ill (Comment);Patient fatigue Vital Signs:  Pain: Pain Assessment Pain Assessment: No/denies pain Pain Score: 0-No pain ADL: ADL ADL Comments: Please see functional navigator Exercises:   Other Treatments:    See Function Navigator for Current Functional Status.   Therapy/Group: Individual Therapy  Gypsy Decant 03/26/2016, 12:50 PM

## 2016-03-26 NOTE — Progress Notes (Signed)
[place] PHYSICAL MEDICINE & REHABILITATION     PROGRESS NOTE  Subjective/Complaints:  Pt seen sitting up in his chair this AM.  He slept well overnight.    ROS: Denies CP, SOB, N/V/D.  Objective: Vital Signs: Blood pressure (!) 145/74, pulse 77, temperature 98.3 F (36.8 C), temperature source Oral, resp. rate 17, weight 89.7 kg (197 lb 11.2 oz), SpO2 98 %. No results found. No results for input(s): WBC, HGB, HCT, PLT in the last 72 hours. No results for input(s): NA, K, CL, GLUCOSE, BUN, CREATININE, CALCIUM in the last 72 hours.  Invalid input(s): CO CBG (last 3)   Recent Labs  03/25/16 1645 03/25/16 2103 03/26/16 0643  GLUCAP 159* 148* 116*    Wt Readings from Last 3 Encounters:  03/21/16 89.7 kg (197 lb 11.2 oz)  03/13/16 99.8 kg (220 lb)  01/03/16 91.6 kg (202 lb)    Physical Exam:  BP (!) 145/74 (BP Location: Right Arm)   Pulse 77   Temp 98.3 F (36.8 C) (Oral)   Resp 17   Wt 89.7 kg (197 lb 11.2 oz)   SpO2 98%   BMI 31.91 kg/m  Constitutional: No distress. Vital signs reviewed. Well-developed.  HENT: Normocephalicand atraumatic.  Eyes: Blind  Cardiovascular: RRR. No JVD. Respiratory: Effort normal breath sounds normal.  GI: Soft. Bowel sounds are normal.  Musculoskeletal: He exhibits no edema. No tenderness.  Neurological: He is alertand oriented.  Follows commands. Fair awareness and insight. Right central 7.  Motor: RUE 4+-5/5 deltoid, biceps, triceps, wrist, hand. LUE 5/5 (unchanged).  RLE 4+-5/5 HF, 4/5 KE and ADF/PF.  LLE 5/5.  Skin: Warm and dry. Intact.  Psychiatric: He has a normal mood and affect. His behavior is normal.    Assessment/Plan: 1. 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 Physiatrist is providing close team supervision and 24 hour management of active medical problems listed below. Physiatrist and rehab  team continue to assess barriers to discharge/monitor patient progress toward functional and medical goals.  Function:  Bathing Bathing position   Position: Shower  Bathing parts Body parts bathed by patient: Right arm, Left arm, Abdomen, Chest, Front perineal area, Buttocks, Right upper leg, Left upper leg, Right lower leg, Left lower leg Body parts bathed by helper: Back  Bathing assist Assist Level: Supervision or verbal cues, Set up      Upper Body Dressing/Undressing Upper body dressing   What is the patient wearing?: Pull over shirt/dress     Pull over shirt/dress - Perfomed by patient: Thread/unthread right sleeve, Thread/unthread left sleeve, Put head through opening, Pull shirt over trunk Pull over shirt/dress - Perfomed by helper: Thread/unthread right sleeve        Upper body assist Assist Level: Set up   Set up : To obtain clothing/put away  Lower Body Dressing/Undressing Lower body dressing   What is the patient wearing?: Underwear, Pants, Socks, Shoes Underwear - Performed by patient: Thread/unthread right underwear leg, Thread/unthread left underwear leg, Pull underwear up/down   Pants- Performed by patient: Thread/unthread right pants leg, Thread/unthread left pants leg, Pull pants up/down, Fasten/unfasten pants Pants- Performed by helper: Thread/unthread right pants leg Non-skid slipper socks- Performed by patient: Don/doff right sock, Don/doff left sock   Socks - Performed by patient: Don/doff right sock, Don/doff left sock   Shoes - Performed by patient: Don/doff right shoe, Don/doff left shoe  Lower body assist Assist for lower body dressing: Supervision or verbal cues, Set up   Set up : To obtain clothing/put away  Toileting Toileting Toileting activity did not occur: No continent bowel/bladder event Toileting steps completed by patient: Adjust clothing prior to toileting, Performs perineal hygiene, Adjust clothing after  toileting Toileting steps completed by helper: Performs perineal hygiene Toileting Assistive Devices: Grab bar or rail  Toileting assist Assist level: Touching or steadying assistance (Pt.75%)   Transfers Chair/bed transfer   Chair/bed transfer method: Stand pivot Chair/bed transfer assist level: Supervision or verbal cues Chair/bed transfer assistive device: Librarian, academic     Max distance: 45 ft Assist level: Touching or steadying assistance (Pt > 75%)   Wheelchair Wheelchair activity did not occur: N/A        Cognition Comprehension Comprehension assist level: Understands complex 90% of the time/cues 10% of the time  Expression Expression assist level: Expresses complex 90% of the time/cues < 10% of the time  Social Interaction Social Interaction assist level: Interacts appropriately 90% of the time - Needs monitoring or encouragement for participation or interaction.  Problem Solving Problem solving assist level: Solves basic problems with no assist  Memory Memory assist level: More than reasonable amount of time    Medical Problem List and Plan: 1. 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 DVT Prophylaxis/Anticoagulation: SCDs. Monitor for any signs of DVT 3. Pain Management: Tylenol as needed 4. Mood: Provide emotional support. Continue Prozac 20 mg daily. 5. Neuropsych: This patient iscapable of making decisions on hisown behalf. 6. Skin/Wound Care: Routine skin checks 7. Fluids/Electrolytes/Nutrition: Routine I&Os  education regarding appropriate food choices 8.Legally blind. Patient used a cane prior to admission was able to navigate about his apartment 9.Hypertension.   Cont Toprol-XL 75 mg daily.   Orthostatics negative  Cozaar 100 mg changed to qhs 2/28  Hydralazine 10 qhs started 3/2  Remains labile, but appears to be improving overall.  10.Diabetes mellitus peripheral neuropathy.  Hemoglobin A1c 7.2. Trandjenta 5 mg daily. Check blood sugars before meals and at bedtime. Diabetic teaching appropriate.  Overall controlled 3/5 11.CAD. Continue aspirin therapy. No chest pain or shortness of breath 12.BPH.Proscar 5mg  daily.   PVR 3 negative 13.Hyperlipidemia. Pravachol 20 mg daily 14.Constipation.linzess 290 mcg daily, Miralax daily 15. CKD III  With AKI, Cr 2.36 on 3/2  IVF qhs started 3/2  Labs pending  Cont to monitor  Encourage adequate po intake   LOS (Days) 10 A FACE TO FACE EVALUATION WAS PERFORMED  Letisha Yera Lorie Phenix 03/26/2016 9:05 AM

## 2016-03-26 NOTE — Progress Notes (Signed)
Physical Therapy Session Note  Patient Details  Name: Caleb Dunn. MRN: [id number] Date of Birth: 07/17/1940  Today's Date: 03/26/2016 PT Individual Time: 0900-0950 PT Individual Time Calculation (min): 50 min   Short Term Goals: Week 2:  PT Short Term Goal 1 (Week 2): = LTGs  Skilled Therapeutic Interventions/Progress Updates: Pt presented in w/c agreeable to therapy. Gait with SPC to rehab gym with HHA due to visual deficits. Pt with x1 LOB with minA from PTA for correction. NuStep L2 x 10 min for endurance and conditioning. Performed 4in toe taps for balance and promoting foot clearance during gait. Performed sit to/from stand 2 x 5 from mat with cues for technique due to increased posterior lean upon standing. Pt transferred to w/c due to fatigue and transferred to room. Ambulatory transfer to toilet and returning to bed. Mod I sit to supine in bed and pt left in bed with call bell within reach and needs met.      Therapy Documentation Precautions:  Precautions Precautions: Fall Precaution Comments: pt is blind, reliance on assistive device Restrictions Weight Bearing Restrictions: No General: PT Amount of Missed Time (min): 50 Minutes PT Missed Treatment Reason: Patient ill (Comment);Patient fatigue Vital Signs:  Pain: Pain Assessment Pain Assessment: No/denies pain Pain Score: 0-No pain  See Function Navigator for Current Functional Status.   Therapy/Group: Individual Therapy  Adaja Wander  Tildon Silveria, PTA  03/26/2016, 12:43 PM

## 2016-03-26 NOTE — Progress Notes (Signed)
Physical Therapy Note  Patient Details  Name: Caleb Calo Sr. MRN: [id number] Date of Birth: 22-Apr-1940 Today's Date: 03/26/2016    Time: 1030-1040 10 minutes  1:1 Pt with no c/o pain. Pt states he feels very fatigued and has been having diarrhea since last session.  Pt states he feels like he "can't exercise right now" but will be willing to try afternoon session.  PT spoke with pt about home eval tomorrow and PT assisted pt with calling his sister who states that she will be at pt's house tomorrow to let everyone in for the home eval.  Pt left in bed with needs at hand.   Lititia Sen 03/26/2016, 10:40 AM

## 2016-03-27 ENCOUNTER — Inpatient Hospital Stay (HOSPITAL_COMMUNITY): Payer: Medicare HMO | Admitting: Physical Therapy

## 2016-03-27 ENCOUNTER — Inpatient Hospital Stay (HOSPITAL_COMMUNITY): Payer: Medicare HMO | Admitting: *Deleted

## 2016-03-27 LAB — GLUCOSE, CAPILLARY
GLUCOSE-CAPILLARY: 121 mg/dL — AB (ref 65–99)
Glucose-Capillary: 126 mg/dL — ABNORMAL HIGH (ref 65–99)
Glucose-Capillary: 162 mg/dL — ABNORMAL HIGH (ref 65–99)
Glucose-Capillary: 157 mg/dL — ABNORMAL HIGH (ref 65–99)

## 2016-03-27 MED ORDER — HYDRALAZINE HCL 10 MG PO TABS
10.0000 mg | ORAL_TABLET | Freq: Three times a day (TID) | ORAL | Status: DC
  Administered 2016-03-27 – 2016-03-29 (×6): 10 mg via ORAL
  Filled 2016-03-27 (×6): qty 1

## 2016-03-27 MED ORDER — SODIUM CHLORIDE 0.9 % IV SOLN
INTRAVENOUS | Status: DC
  Administered 2016-03-27 – 2016-03-29 (×2): via INTRAVENOUS

## 2016-03-27 NOTE — Progress Notes (Signed)
[place] PHYSICAL MEDICINE & REHABILITATION     PROGRESS NOTE  Subjective/Complaints:  Pt seen sitting up in bed this AM.  He slept well overnight. He wants to know when he is going to be discharged.    ROS: Denies CP, SOB, N/V/D.  Objective: Vital Signs: Blood pressure (!) 160/66, pulse 65, temperature 98.2 F (36.8 C), temperature source Oral, resp. rate 17, weight 89.7 kg (197 lb 11.2 oz), SpO2 99 %. No results found.  Recent Labs  03/26/16 1047  WBC 7.7  HGB 12.8*  HCT 39.0  PLT 197    Recent Labs  03/26/16 1047  NA 135  K 5.0  CL 108  GLUCOSE 180*  BUN 37*  CREATININE 2.77*  CALCIUM 9.2   CBG (last 3)   Recent Labs  03/26/16 1653 03/26/16 2121 03/27/16 0639  GLUCAP 162* 120* 121*    Wt Readings from Last 3 Encounters:  03/21/16 89.7 kg (197 lb 11.2 oz)  03/13/16 99.8 kg (220 lb)  01/03/16 91.6 kg (202 lb)    Physical Exam:  BP (!) 160/66 (BP Location: Right Arm)   Pulse 65   Temp 98.2 F (36.8 C) (Oral)   Resp 17   Wt 89.7 kg (197 lb 11.2 oz)   SpO2 99%   BMI 31.91 kg/m  Constitutional: No distress. Vital signs reviewed. Well-developed.  HENT: Normocephalicand atraumatic.  Eyes: Blind  Cardiovascular: RRR. No JVD. Respiratory: Effort normal breath sounds normal.  GI: Soft. Bowel sounds are normal.  Musculoskeletal: He exhibits no edema. No tenderness.  Neurological: He is alertand oriented.  Follows commands. Fair awareness and insight. Right central 7.  Motor: RUE 4+-5/5 deltoid, biceps, triceps, wrist, hand. LUE 5/5 (stable).  RLE 4+-5/5 HF, 4/5 KE and ADF/PF.  LLE 5/5.  Skin: Warm and dry. Intact.  Psychiatric: He has a normal mood and affect. His behavior is normal.    Assessment/Plan: 1. 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 Physiatrist is providing close team supervision and 24 hour management of active  medical problems listed below. Physiatrist and rehab team continue to assess barriers to discharge/monitor patient progress toward functional and medical goals.  Function:  Bathing Bathing position   Position: Wheelchair/chair at sink  Bathing parts Body parts bathed by patient: Right arm, Left arm, Abdomen, Chest, Front perineal area, Buttocks, Right upper leg, Left upper leg, Right lower leg, Left lower leg, Back Body parts bathed by helper: Back  Bathing assist Assist Level: Supervision or verbal cues      Upper Body Dressing/Undressing Upper body dressing   What is the patient wearing?: Pull over shirt/dress     Pull over shirt/dress - Perfomed by patient: Thread/unthread right sleeve, Thread/unthread left sleeve, Put head through opening, Pull shirt over trunk Pull over shirt/dress - Perfomed by helper: Thread/unthread right sleeve        Upper body assist Assist Level: Set up   Set up : To obtain clothing/put away  Lower Body Dressing/Undressing Lower body dressing   What is the patient wearing?: Underwear, Pants, Socks, Shoes Underwear - Performed by patient: Thread/unthread right underwear leg, Thread/unthread left underwear leg, Pull underwear up/down   Pants- Performed by patient: Thread/unthread right pants leg, Thread/unthread left pants leg, Pull pants up/down, Fasten/unfasten pants Pants- Performed by helper: Thread/unthread right pants leg Non-skid slipper socks- Performed by patient: Don/doff right sock, Don/doff left sock   Socks - Performed  by patient: Don/doff right sock, Don/doff left sock   Shoes - Performed by patient: Don/doff right shoe, Don/doff left shoe            Lower body assist Assist for lower body dressing: Supervision or verbal cues, Set up   Set up : To obtain clothing/put away  Toileting Toileting Toileting activity did not occur: No continent bowel/bladder event Toileting steps completed by patient: Adjust clothing prior to  toileting, Performs perineal hygiene, Adjust clothing after toileting Toileting steps completed by helper: Performs perineal hygiene Toileting Assistive Devices: Grab bar or rail  Toileting assist Assist level: Touching or steadying assistance (Pt.75%)   Transfers Chair/bed transfer   Chair/bed transfer method: Stand pivot Chair/bed transfer assist level: Supervision or verbal cues Chair/bed transfer assistive device: Librarian, academic     Max distance: 45 ft Assist level: Touching or steadying assistance (Pt > 75%)   Wheelchair Wheelchair activity did not occur: N/A        Cognition Comprehension Comprehension assist level: Understands complex 90% of the time/cues 10% of the time  Expression Expression assist level: Expresses basic needs/ideas: With no assist  Social Interaction Social Interaction assist level: Interacts appropriately 90% of the time - Needs monitoring or encouragement for participation or interaction.  Problem Solving Problem solving assist level: Solves basic 90% of the time/requires cueing < 10% of the time  Memory Memory assist level: Recognizes or recalls 90% of the time/requires cueing < 10% of the time    Medical Problem List and Plan: 1. 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 DVT Prophylaxis/Anticoagulation: SCDs. Monitor for any signs of DVT 3. Pain Management: Tylenol as needed 4. Mood: Provide emotional support. Continue Prozac 20 mg daily. 5. Neuropsych: This patient iscapable of making decisions on hisown behalf. 6. Skin/Wound Care: Routine skin checks 7. Fluids/Electrolytes/Nutrition: Routine I&Os  education regarding appropriate food choices 8.Legally blind. Patient used a cane prior to admission was able to navigate about his apartment 9.Hypertension.   Cont Toprol-XL 75 mg daily.   Orthostatics negative  Cozaar 100 mg changed to qhs 2/28  Hydralazine 10 qhs started 3/2,  increased to TID on 3/6 10.Diabetes mellitus peripheral neuropathy. Hemoglobin A1c 7.2. Trandjenta 5 mg daily. Check blood sugars before meals and at bedtime. Diabetic teaching appropriate.  Overall controlled 3/6 11.CAD. Continue aspirin therapy. No chest pain or shortness of breath 12.BPH.Proscar 5mg  daily.   PVR 3 negative 13.Hyperlipidemia. Pravachol 20 mg daily 14.Constipation.linzess 290 mcg daily, Miralax daily 15. CKD III  With AKI, Cr 2.77 on 3/6  IVF qhs started 3/2  Cont to monitor  Encourage adequate po intake   LOS (Days) 11 A FACE TO FACE EVALUATION WAS PERFORMED  Judeen Geralds Lorie Phenix 03/27/2016 8:34 AM

## 2016-03-27 NOTE — Progress Notes (Signed)
Physical Therapy Note  Patient Details  Name: Caleb Fouch Sr. MRN: [id number] Date of Birth: 03/25/1940 Today's Date: 03/27/2016    Time: 1445-1510 25 minutes  1:1 No c/o pain. Pt performed gait 150' with supervision for path finding only.  Nu step level 4 x 9 minutes for UE/LE strengthening. Pt fatigued after nu step and used w/c with total A to get back to room. Pt able to perform transfer to bed and bed mobility with mod I.   Caleb Dunn 03/27/2016, 3:34 PM

## 2016-03-27 NOTE — Progress Notes (Signed)
Occupational Therapy Session Note  Patient Details  Name: Caleb Martine Sr. MRN: [id number] Date of Birth: [birth year]  Today's Date: 03/27/2016 OT Individual Time: 1000-1130 OT Individual Time Calculation (min): 90 min    Short Term Goals: Week 2:  OT Short Term Goal 1 (Week 2): short term goals= LTGs secondary to upcoming discharge  Skilled Therapeutic Interventions/Progress Updates:    Pt seen this session for home safety evaluation in preparation for upcoming hospital discharge. Pt required supervision with cues for navigation to exit [address] and walk up pathway into home. Pt had several family members present at home during evaluation. Pt ambulated in home without use of SPC in his natural environment. Pt able to verbalize each section of home as well as demonstrate ability to transfer into bed, step into and out of tub, use microwave, and fix drink and carry into sitting room. Pt performed all these tasks at mod I level with caregivers verbalizing pt appears to be at baseline with mobility and functional task. Pt also demonstrating floor transfer with min verbal cues for technique. OT recommended pt have life alert or cell phone on himself at all times once returning home. Pt returned to hospital and OT notified social worker of home evaluation outcome. OT discussed patient performance as well as upcoming discharge recommendations. Pt verbalized understanding and agreement. Pt returning to room call bell and all needed items within reach  Therapy Documentation Precautions:  Precautions Precautions: Fall Precaution Comments: pt is blind, reliance on assistive device Restrictions Weight Bearing Restrictions: No General:   Vital Signs:  Pain: Pain Assessment Pain Assessment: No/denies pain Pain Score: 0-No pain ADL: ADL ADL Comments: Please see functional navigator Exercises:   Other Treatments:    See Function Navigator for Current Functional Status.   Therapy/Group:  Individual Therapy  Gypsy Decant 03/27/2016, 12:38 PM

## 2016-03-27 NOTE — Progress Notes (Signed)
Physical Therapy Session Note  Patient Details  Name: Caleb Housholder Sr. MRN: [id number] Date of Birth: 02-03-1940  Today's Date: 03/27/2016 PT Individual Time: 0815-0900 1400-1415 PT Individual Time Calculation (min): 45 min & 15 min  Short Term Goals: Week 1:  PT Short Term Goal 1 (Week 1): =LTGs due to ELOS  Skilled Therapeutic Interventions/Progress Updates: Tx 1: Pt presented in bed agreeable to therapy. Supine to sit mod I with use of bed rail. Squat pivot transfer supervision with set up due to visual deficits. Seated heel cord stretch on wedge 1 min x 3. Gait 25f with SPT and min guard for directional support. NuStep L 2 x 10 min for endurance and conditioning. Performed side stepping and marching with HHA, cues for increased foot clearance. Pt required frequent rest breaks due to fatigue. Pt returned to room and returned to bed via same manner as previous with call bell within reach and needs met.   Tx2:Pt presented in bed agreeable to therapy. Answered pt's queries regarding d/c process and recommendation for HHPT after d/c. Supine to sit mod I with beds rail. Instructed pt in heel cord stretch via towel and recommend to do prior to gait. Gait 140 ft with HHA for visual deficits. Pt returned to room and returned to bed with all needs met and visitor present.      Therapy Documentation Precautions:  Precautions Precautions: Fall Precaution Comments: pt is blind, reliance on assistive device Restrictions Weight Bearing Restrictions: No General:   Vital Signs:    See Function Navigator for Current Functional Status.   Therapy/Group: Individual Therapy  Tatym Schermer  Keiran Gaffey, PTA  03/27/2016, 2:15 PM

## 2016-03-28 ENCOUNTER — Inpatient Hospital Stay (HOSPITAL_COMMUNITY): Payer: Medicare HMO | Admitting: Occupational Therapy

## 2016-03-28 ENCOUNTER — Inpatient Hospital Stay (HOSPITAL_COMMUNITY): Payer: Medicare HMO | Admitting: Physical Therapy

## 2016-03-28 LAB — GLUCOSE, CAPILLARY
GLUCOSE-CAPILLARY: 121 mg/dL — AB (ref 65–99)
GLUCOSE-CAPILLARY: 138 mg/dL — AB (ref 65–99)
Glucose-Capillary: 145 mg/dL — ABNORMAL HIGH (ref 65–99)
Glucose-Capillary: 109 mg/dL — ABNORMAL HIGH (ref 65–99)

## 2016-03-28 NOTE — Progress Notes (Signed)
Physical Therapy Session Note  Patient Details  Name: Caleb Doyle Sr. MRN: [id number] Date of Birth: 21-Dec-1940  Today's Date: 03/28/2016 PT Individual Time: [phone] and 1300-1345 PT Individual Time Calculation (min): 70 min and 45 min   Short Term Goals: Week 2:  PT Short Term Goal 1 (Week 2): = LTGs  Skilled Therapeutic Interventions/Progress Updates:  Tx 1:  Pt presented in w/c agreeable to therapy. Performed car transfer with set up only, bed mobility from flat surface mod I no rails. Gait with SPC 157f with cues for directional support. Pt wearing sneakers today demonstrating improved foot clearance and step length. Ascend/decend 12 steps 2 rails with min guard for vision deficits. Pt able to perform step through pattern ascending and step to descending. NuStep L2 x 10 min for increased endurance pt able to maintain 80-90 steps per minute. Performed standing balance activities performing side-steps, backwards steps x 4 with HHA and cues for postural correction. Pt returned to room via HHA and [place] x 1 seated break due to fatigue. Pt returned to bed after session with alarm on and call bell within reach.   Tx2: Pt in bed agreeable to therapy.  Supine to sit mod I with bed flat, ambulated [facility] rehab [facility]. Balance activities including toe taps on 4in step, marching in place with HHA, sit to stand no UE support and side stepping. Standing on wedge for heel cord stretch. Pt required x 1 cue for sit to stand for decreased lean against mat. Pt ambulated back to room with x 1 seated rest. Pt remained in w/c with call bell within reach and needs met.      Therapy Documentation Precautions:  Precautions Precautions: Fall Precaution Comments: pt is blind, reliance on assistive device Restrictions Weight Bearing Restrictions: No General:   Vital Signs: Therapy Vitals Temp: 97.3 F (36.3 C) Temp Source: Oral Pulse Rate: 64 Resp: 18 BP: (!) 143/63 Patient  Position (if appropriate): Lying Oxygen Therapy SpO2: 100 % O2 Device: Not Delivered Pain:   Mobility:   Locomotion : Ambulation Ambulation/Gait Assistance: 5: Supervision Ambulation Distance (Feet): 161 Feet Assistive device: Straight cane Ambulation/Gait Assistance Details: HHA for visual deficits improved foot clearance  Gait Gait velocity: decreased  Stairs / Additional Locomotion Stairs: Yes Stairs Assistance: 4: Min guard Stair Management Technique: Two rails Number of Stairs: 12 Height of Stairs: 6       See Function Navigator for Current Functional Status.   Therapy/Group: Individual Therapy  Caleb Dunn  Caleb Dunn, PTA  03/28/2016, 4:40 PM

## 2016-03-28 NOTE — Progress Notes (Signed)
[place] PHYSICAL MEDICINE & REHABILITATION     PROGRESS NOTE  Subjective/Complaints:  Pt seen laying in bed this AM.  He slept well overnight.  He denies complaints.   ROS: Denies CP, SOB, N/V/D.  Objective: Vital Signs: Blood pressure (!) 147/61, pulse 62, temperature 98.2 F (36.8 C), temperature source Oral, resp. rate 18, weight 89.7 kg (197 lb 11.2 oz), SpO2 100 %. No results found.  Recent Labs  03/26/16 1047  WBC 7.7  HGB 12.8*  HCT 39.0  PLT 197    Recent Labs  03/26/16 1047  NA 135  K 5.0  CL 108  GLUCOSE 180*  BUN 37*  CREATININE 2.77*  CALCIUM 9.2   CBG (last 3)   Recent Labs  03/27/16 1648 03/27/16 2052 03/28/16 0645  GLUCAP 162* 157* 121*    Wt Readings from Last 3 Encounters:  03/21/16 89.7 kg (197 lb 11.2 oz)  03/13/16 99.8 kg (220 lb)  01/03/16 91.6 kg (202 lb)    Physical Exam:  BP (!) 147/61 (BP Location: Left Arm)   Pulse 62   Temp 98.2 F (36.8 C) (Oral)   Resp 18   Wt 89.7 kg (197 lb 11.2 oz)   SpO2 100%   BMI 31.91 kg/m  Constitutional: No distress. Vital signs reviewed. Well-developed.  HENT: Normocephalicand atraumatic.  Eyes: Blind  Cardiovascular: RRR. No JVD. Respiratory: Effort normal breath sounds normal.  GI: Soft. Bowel sounds are normal.  Musculoskeletal: He exhibits no edema. No tenderness.  Neurological: He is alertand oriented.  Follows commands. Fair awareness and insight. Right central 7.  Motor: RUE 4+-5/5 deltoid, biceps, triceps, wrist, hand. LUE 5/5 (unchanged).  RLE 4+-5/5 HF, 4/5 KE and ADF/PF.  LLE 5/5.  Skin: Warm and dry. Intact.  Psychiatric: He has a normal mood and affect. His behavior is normal.    Assessment/Plan: 1. 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 Physiatrist is providing close team supervision and 24 hour management of active medical problems listed  below. Physiatrist and rehab team continue to assess barriers to discharge/monitor patient progress toward functional and medical goals.  Function:  Bathing Bathing position   Position: Wheelchair/chair at sink  Bathing parts Body parts bathed by patient: Right arm, Left arm, Abdomen, Chest, Front perineal area, Buttocks, Right upper leg, Left upper leg, Right lower leg, Left lower leg, Back Body parts bathed by helper: Back  Bathing assist Assist Level: Supervision or verbal cues      Upper Body Dressing/Undressing Upper body dressing   What is the patient wearing?: Pull over shirt/dress     Pull over shirt/dress - Perfomed by patient: Thread/unthread right sleeve, Thread/unthread left sleeve, Put head through opening, Pull shirt over trunk Pull over shirt/dress - Perfomed by helper: Thread/unthread right sleeve        Upper body assist Assist Level: Set up   Set up : To obtain clothing/put away  Lower Body Dressing/Undressing Lower body dressing   What is the patient wearing?: Underwear, Pants, Socks, Shoes Underwear - Performed by patient: Thread/unthread right underwear leg, Thread/unthread left underwear leg, Pull underwear up/down   Pants- Performed by patient: Thread/unthread right pants leg, Thread/unthread left pants leg, Pull pants up/down, Fasten/unfasten pants Pants- Performed by helper: Thread/unthread right pants leg Non-skid slipper socks- Performed by patient: Don/doff right sock, Don/doff left sock   Socks - Performed by patient: Don/doff right sock, Don/doff left sock  Shoes - Performed by patient: Don/doff right shoe, Don/doff left shoe            Lower body assist Assist for lower body dressing: Supervision or verbal cues, Set up   Set up : To obtain clothing/put away  Toileting Toileting Toileting activity did not occur: No continent bowel/bladder event Toileting steps completed by patient: Adjust clothing prior to toileting, Performs perineal  hygiene, Adjust clothing after toileting Toileting steps completed by helper: Performs perineal hygiene Toileting Assistive Devices: Grab bar or rail  Toileting assist Assist level: Touching or steadying assistance (Pt.75%)   Transfers Chair/bed transfer   Chair/bed transfer method: Stand pivot Chair/bed transfer assist level: Supervision or verbal cues Chair/bed transfer assistive device: Librarian, academic     Max distance: 45 ft Assist level: Touching or steadying assistance (Pt > 75%)   Wheelchair Wheelchair activity did not occur: N/A        Cognition Comprehension Comprehension assist level: Understands complex 90% of the time/cues 10% of the time  Expression Expression assist level: Expresses basic needs/ideas: With no assist  Social Interaction Social Interaction assist level: Interacts appropriately 90% of the time - Needs monitoring or encouragement for participation or interaction.  Problem Solving Problem solving assist level: Solves basic 90% of the time/requires cueing < 10% of the time  Memory Memory assist level: Recognizes or recalls 90% of the time/requires cueing < 10% of the time    Medical Problem List and Plan: 1. 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 DVT Prophylaxis/Anticoagulation: SCDs. Monitor for any signs of DVT 3. Pain Management: Tylenol as needed 4. Mood: Provide emotional support. Continue Prozac 20 mg daily. 5. Neuropsych: This patient iscapable of making decisions on hisown behalf. 6. Skin/Wound Care: Routine skin checks 7. Fluids/Electrolytes/Nutrition: Routine I&Os  education regarding appropriate food choices 8.Legally blind. Patient used a cane prior to admission was able to navigate about his apartment 9.Hypertension.   Cont Toprol-XL 75 mg daily.   Orthostatics negative  Cozaar 100 mg changed to qhs 2/28  Hydralazine 10 qhs started 3/2, increased to TID on  3/6  Improving 10.Diabetes mellitus peripheral neuropathy. Hemoglobin A1c 7.2. Trandjenta 5 mg daily. Check blood sugars before meals and at bedtime. Diabetic teaching appropriate.  Overall controlled 3/7 11.CAD. Continue aspirin therapy. No chest pain or shortness of breath 12.BPH.Proscar 5mg  daily.   PVR 3 negative 13.Hyperlipidemia. Pravachol 20 mg daily 14.Constipation.linzess 290 mcg daily, Miralax daily 15. CKD III  With AKI, Cr 2.77 on 3/6  IVF qhs started 3/2  Labs ordered for tomorrow  Cont to monitor  Encourage adequate po intake   LOS (Days) 12 A FACE TO FACE EVALUATION WAS PERFORMED  Dakota Stangl Lorie Phenix 03/28/2016 8:49 AM

## 2016-03-28 NOTE — Discharge Summary (Signed)
NAMEJAXSUN, CIAMPI NO.:  [id number]  MEDICAL RECORD NO.:  [id number]  LOCATION:  D33C                         FACILITY:  [facility]  PHYSICIAN:  Delice Lesch, MD        DATE OF BIRTH:  Dec 27, 1940  DATE OF ADMISSION:  03/13/2016 DATE OF DISCHARGE:  03/28/2016                              DISCHARGE SUMMARY   DISCHARGE DIAGNOSES: 1. Left parietal infarction. 2. SCDs for deep venous thrombosis prophylaxis. 3. Legally blind. 4. Hypertension. 5. Diabetes mellitus, peripheral neuropathy. 6. Coronary artery disease. 7. Chronic kidney disease, stage 3 with creatinine baseline of 2.04. 8. Benign prostatic hypertrophy. 9. Hyperlipidemia. 10.Constipation, resolved.  HISTORY OF PRESENT ILLNESS:  This is a 76 year old right-handed male, who is legally blind with history of CAD maintained on aspirin, type 2 diabetes mellitus.  He lives alone, used a cane.  Lives in independent living at [place].  His daughter and family members assist with finance, appointments, and his meals.  He presented on March 13, 2016, with dizziness, altered mental status, and unsteady gait.  Cranial CT scan negative.  MRI showed restricted diffusion left parietal lobe consistent with acute infarction.  Small area of subarachnoid hemorrhage and left parietal region felt to possibly be acute.  The patient did not receive tPA.  Echocardiogram with ejection fraction of 37% grade 1 diastolic dysfunction.  Carotid Dopplers with no ICA stenosis. Neurology consulted.  Maintained on aspirin therapy 325 mg daily.  No plan for TEE or loop recorder.  Tolerating a regular diet.  Physical and occupational therapy ongoing.  The patient was admitted for comprehensive rehab program.  PAST MEDICAL HISTORY:  See discharge diagnoses.  SOCIAL HISTORY:  Lives alone at independent living facility.  He used a cane prior to admission.  Family does assist with him attending his medical appointments and  finances.  FUNCTIONAL STATUS:  Upon admission to rehab services was minimal guard at 150 feet with a straight cane, minimal guard sit-to-stand; min-to-mod assist activities of daily living.  PHYSICAL EXAMINATION:  VITAL SIGNS:  Blood pressure 168/87, pulse 67, temperature 97, respirations 20. GENERAL:  This is an alert male, oriented to person, place, and time. Fair awareness of deficits.  Speech slightly dysarthric, but fully intelligible. CARDIAC:  Regular rate and rhythm.  No murmur. ABDOMEN:  Soft, nontender.  Good bowel sounds. LUNGS:  Clear to auscultation without wheeze.  REHABILITATION HOSPITAL COURSE:  The patient was admitted to inpatient rehab services with therapies initiated on a 3-hour daily basis, consisting of physical therapy, occupational therapy, and rehabilitation nursing.  The following issues were addressed during the patient's rehabilitation stay.  Pertaining to Mr. Siemen left parietal cerebrovascular accident remain stable, maintained on aspirin therapy. He would follow up with Neurology Services.  SCDs for DVT prophylaxis. No signs of deep vein thrombosis.  The patient was legally blind.  He used a cane prior to admission.  He was able to navigate about his own apartment on his own.  Blood pressures controlled.  He continued on hydralazine as well as Cozaar and Toprol.  He would follow up with his primary M,D.  His hydralazine had recently being increased to 10 mg every 8 hours.  Diabetes  mellitus, peripheral neuropathy, hemoglobin A1c of 7.2,  he remained on Trandjenta 5 mg daily with full diabetic teaching.  History of coronary artery disease, no chest pain or shortness of breath.  He was advised to continue aspirin therapy. Benign prostatic hypertrophy, maintained on Proscar.  Postvoid residuals were unremarkable.  He would continue Pravachol for hyperlipidemia. Chronic kidney disease, stage 3, creatinine baseline 2.04 elevated to 2.77.  He did  receive IV fluids at bedtime.  Followup chemistries improved.  The patient received weekly collaborative interdisciplinary team conferences to discuss estimated length of stay, family teaching, any barriers to his discharge.  He performed car transfers with setup only.  Bed mobility from flat surface modified independence, no rails. Ambulating straight point cane at 160 feet with cuing for directional support.  Demonstrated improved foot clearance and step length.  He could navigate up and down stairs with a hand rail and minimal guard for visual deficits.  The patient could move about in his room using a straight-point cane again supervision for visual deficits.  Gather belongings for activities of daily living and homemaking with simple set up.  Full teaching was completed and plan was discharge back to independent living facility.  DISCHARGE MEDICATIONS:  Included; 1. Aspirin 325 mg p.o. daily. 2. Proscar 5 mg p.o. daily. 3. Prozac 20 mg p.o. daily. 4. Hydralazine 10 mg p.o. every 8 hours. 5. Linzess 290 mcg p.o. daily. 6. Tradjenta 5 mg p.o. daily. 7. Cozaar 100 mg p.o. at bedtime. 8. Toprol-XL 75 mg p.o. daily. 9. Protonix 40 mg p.o. daily. 10.Pravachol 20 mg p.o. daily. 11.Tylenol as needed.  DIET:  Diabetic diet.  FOLLOWUP:  He would follow up with Dr. Posey Pronto at the outpatient rehab service office as directed; Dr. Antony Contras, Neurology Service, call for appointment; Dr. Lew Dawes, medical management.     Lauraine Rinne, P.A.   ______________________________ Delice Lesch, MD    DA/MEDQ  D:  03/28/2016  T:  03/28/2016  Job:  [id number]  cc:   Pramod P. Leonie Man, [place] Plot[place], MD Delice Lesch, MD

## 2016-03-28 NOTE — Discharge Summary (Signed)
Discharge summary job# [id number]

## 2016-03-28 NOTE — Progress Notes (Signed)
Occupational Therapy Discharge Summary  Patient Details  Name: Caleb Hooton Sr. MRN: [id number] Date of Birth: 08-Oct-1940  Today's Date: 03/28/2016 OT Individual Time: 1324-4010 OT Individual Time Calculation (min): 75 min    Patient has met 11 of 11 long term goals due to improved activity tolerance, improved balance, ability to compensate for deficits and improved awareness.  Patient to discharge at overall Modified Independent level. Pt's family will provide intermittent supervision with IADL tasks as they were doing prior to admission.  Reasons goals not met: all goals met  Recommendation:  Pt will does not require follow up OT intervention at discharge.  Equipment: No equipment provided  Reasons for discharge: treatment goals met  Patient/family agrees with progress made and goals achieved: Yes   OT Intervention: Upon entering the room, pt performing shower with sit <>stand from shower seat with use of grab bar at mod I level. Pt seated on EOB to don clothing items at mod I level as well. Pt engaged in B UE strengthening exercises shoulder flexion, shoulder horizontal abduction/adduction, and chest press with 4 lb hand weight. Pt remaining seated in wheelchair at end of session with call bell and all needed items within reach.   OT Discharge Precautions/Restrictions  Precautions Precautions: Fall Precaution Comments: pt is blind, reliance on assistive device Restrictions Weight Bearing Restrictions: No General   Vital Signs Therapy Vitals Temp: 98.2 F (36.8 C) Temp Source: Oral Pulse Rate: 62 Resp: 18 BP: (!) 147/61 Patient Position (if appropriate): Lying Oxygen Therapy SpO2: 100 % O2 Device: Not Delivered Pain Pain Assessment Pain Assessment: No/denies pain ADL ADL ADL Comments: Please see functional navigator Vision/Perception  Vision- History Baseline Vision/History: Legally blind Patient Visual Report: No change from baseline  Cognition Overall  Cognitive Status: Impaired/Different from baseline Arousal/Alertness: Awake/alert Orientation Level: Oriented X4 Sensation Sensation Light Touch: Appears Intact Stereognosis: Not tested Hot/Cold: Appears Intact Proprioception: Appears Intact Motor    Mobility  Bed Mobility Bed Mobility: Supine to Sit Supine to Sit: 6: Modified independent (Device/Increase time) Transfers Transfers: Sit to Stand;Stand to Sit Sit to Stand: 6: Modified independent (Device/Increase time) Stand to Sit: 6: Modified independent (Device/Increase time)  Trunk/Postural Assessment  Cervical Assessment Cervical Assessment: Within Functional Limits Thoracic Assessment Thoracic Assessment: Within Functional Limits Lumbar Assessment Lumbar Assessment: Within Functional Limits Postural Control Postural Control: Deficits on evaluation  Balance Balance Balance Assessed: Yes Dynamic Sitting Balance Dynamic Sitting - Balance Support: During functional activity Dynamic Sitting - Level of Assistance: 6: Modified independent (Device/Increase time) Static Standing Balance Static Standing - Balance Support: During functional activity Static Standing - Level of Assistance: 6: Modified independent (Device/Increase time) Dynamic Standing Balance Dynamic Standing - Level of Assistance: 6: Modified independent (Device/Increase time) Extremity/Trunk Assessment RUE Assessment RUE Assessment: Within Functional Limits LUE Assessment LUE Assessment: Within Functional Limits   See Function Navigator for Current Functional Status.  Darleen Crocker P 03/28/2016, 7:24 AM

## 2016-03-28 NOTE — Discharge Instructions (Signed)
Inpatient Rehab Discharge Instructions  Caleb Duval Sr. Discharge date and time: No discharge date for patient encounter.   Activities/Precautions/ Functional Status: Activity: activity as tolerated Diet: diabetic diet Wound Care: none needed Functional status:  ___ No restrictions     ___ Walk up steps independently ___ 24/7 supervision/assistance   ___ Walk up steps with assistance ___ Intermittent supervision/assistance  ___ Bathe/dress independently ___ Walk with walker     _x    STROKE/TIA DISCHARGE INSTRUCTIONS SMOKING Cigarette smoking nearly doubles your risk of having a stroke & is the single most alterable risk factor  If you smoke or have smoked in the last 12 months, you are advised to quit smoking for your health.  Most of the excess cardiovascular risk related to smoking disappears within a year of stopping.  Ask you doctor about anti-smoking medications  [place] Quit Line: 1-800-QUIT NOW  Free Smoking Cessation Classes [phone]  CHOLESTEROL Know your levels; limit fat & cholesterol in your diet  Lipid Panel     Component Value Date/Time   CHOL 132 03/14/2016 0321   TRIG 75 03/14/2016 0321   HDL 41 03/14/2016 0321   CHOLHDL 3.2 03/14/2016 0321   VLDL 15 03/14/2016 0321   LDLCALC 76 03/14/2016 0321      Many patients benefit from treatment even if their cholesterol is at goal.  Goal: Total Cholesterol (CHOL) less than 160  Goal:  Triglycerides (TRIG) less than 150  Goal:  HDL greater than 40  Goal:  LDL (LDLCALC) less than 100   BLOOD PRESSURE American Stroke Association blood pressure target is less that 120/80 mm/Hg  Your discharge blood pressure is:  BP: (!) 145/74  Monitor your blood pressure  Limit your salt and alcohol intake  Many individuals will require more than one medication for high blood pressure  DIABETES (A1c is a blood sugar average for last 3 months) Goal HGBA1c is under 7% (HBGA1c is blood sugar average for last 3 months)    Diabetes:     Lab Results  Component Value Date   HGBA1C 7.2 (H) 03/14/2016     Your HGBA1c can be lowered with medications, healthy diet, and exercise.  Check your blood sugar as directed by your physician  Call your physician if you experience unexplained or low blood sugars.  PHYSICAL ACTIVITY/REHABILITATION Goal is 30 minutes at least 4 days per week  Activity: Increase activity slowly, Therapies: Physical Therapy: Home Health Return to work:   Activity decreases your risk of heart attack and stroke and makes your heart stronger.  It helps control your weight and blood pressure; helps you relax and can improve your mood.  Participate in a regular exercise program.  Talk with your doctor about the best form of exercise for you (dancing, walking, swimming, cycling).  DIET/WEIGHT Goal is to maintain a healthy weight  Your discharge diet is: Diet heart healthy/carb modified Room service appropriate? Yes; Fluid consistency: Thin  liquids Your height is:    Your current weight is: Weight: 89.7 kg (197 lb 11.2 oz) Your Body Mass Index (BMI) is:     Following the type of diet specifically designed for you will help prevent another stroke.  Your goal weight range is:    Your goal Body Mass Index (BMI) is 19-24.  Healthy food habits can help reduce 3 risk factors for stroke:  High cholesterol, hypertension, and excess weight.  RESOURCES Stroke/Support Group:  Call [facility]  Stroke warning signs and symptoms How to activate emergency medical system (call 911). Medications prescribed at discharge. Need for follow-up after discharge. Personal risk factors for stroke. Pneumonia vaccine given:  Flu vaccine given:  My questions have been answered, the writing is legible, and I understand these instructions.  I will adhere to these goals & educational materials that have been provided to me after my discharge from the  hospital.   __ Bathe/dress with assistance ___ Walk Independently    ___ Shower independently ___ Walk with assistance    ___ Shower with assistance ___ No alcohol     ___ Return to work/school ________  COMMUNITY REFERRALS UPON DISCHARGE:    Home Health:   PT                     Agency:  [facility] Phone: [phone]       Special Instructions:    My questions have been answered and I understand these instructions. I will adhere to these goals and the provided educational materials after my discharge from the hospital.  Patient/Caregiver Signature _______________________________ Date __________  Clinician Signature _______________________________________ Date __________  Please bring this form and your medication list with you to all your follow-up doctor's appointments.

## 2016-03-28 NOTE — Discharge Summary (Deleted)
  The note originally documented on this encounter has been moved the the encounter in which it belongs.  

## 2016-03-29 LAB — BASIC METABOLIC PANEL
ANION GAP: 8 (ref 5–15)
BUN: 33 mg/dL — AB (ref 6–20)
CHLORIDE: 105 mmol/L (ref 101–111)
CO2: 23 mmol/L (ref 22–32)
Calcium: 8.9 mg/dL (ref 8.9–10.3)
Creatinine, Ser: 2.22 mg/dL — ABNORMAL HIGH (ref 0.61–1.24)
GFR calc Af Amer: 31 mL/min — ABNORMAL LOW (ref >60)
GFR, EST NON AFRICAN AMERICAN: 27 mL/min — AB (ref >60)
GLUCOSE: 115 mg/dL — AB (ref 65–99)
POTASSIUM: 4.6 mmol/L (ref 3.5–5.1)
Sodium: 136 mmol/L (ref 135–145)

## 2016-03-29 LAB — CBC WITH DIFFERENTIAL/PLATELET
BASOS ABS: 0 10*3/uL (ref 0.0–0.1)
Basophils Relative: 0 %
EOS PCT: 3 %
Eosinophils Absolute: 0.2 10*3/uL (ref 0.0–0.7)
HEMATOCRIT: 40 % (ref 39.0–52.0)
Hemoglobin: 13 g/dL (ref 13.0–17.0)
LYMPHS ABS: 2.3 10*3/uL (ref 0.7–4.0)
LYMPHS PCT: 34 %
MCH: 27.1 pg (ref 26.0–34.0)
MCHC: 32.5 g/dL (ref 30.0–36.0)
MCV: 83.3 fL (ref 78.0–100.0)
MONO ABS: 0.6 10*3/uL (ref 0.1–1.0)
Monocytes Relative: 9 %
NEUTROS ABS: 3.7 10*3/uL (ref 1.7–7.7)
Neutrophils Relative %: 54 %
PLATELETS: 199 10*3/uL (ref 150–400)
RBC: 4.8 MIL/uL (ref 4.22–5.81)
RDW: 14 % (ref 11.5–15.5)
WBC: 6.8 10*3/uL (ref 4.0–10.5)

## 2016-03-29 LAB — GLUCOSE, CAPILLARY: GLUCOSE-CAPILLARY: 102 mg/dL — AB (ref 65–99)

## 2016-03-29 MED ORDER — PANTOPRAZOLE SODIUM 40 MG PO TBEC: 40.0000 mg | DELAYED_RELEASE_TABLET | Freq: Every day | ORAL | 0 refills | Status: DC

## 2016-03-29 MED ORDER — LOSARTAN POTASSIUM 100 MG PO TABS: 100.0000 mg | ORAL_TABLET | Freq: Every day | ORAL | 1 refills | Status: DC

## 2016-03-29 MED ORDER — ASPIRIN 325 MG PO TBEC: 325.0000 mg | DELAYED_RELEASE_TABLET | Freq: Every day | ORAL | 0 refills | Status: DC

## 2016-03-29 MED ORDER — HYDRALAZINE HCL 10 MG PO TABS: 10.0000 mg | ORAL_TABLET | Freq: Three times a day (TID) | ORAL | 1 refills | Status: DC

## 2016-03-29 MED ORDER — LINAGLIPTIN 5 MG PO TABS: 5.0000 mg | ORAL_TABLET | Freq: Every day | ORAL | 1 refills | Status: DC

## 2016-03-29 MED ORDER — VITAMIN D3 25 MCG (1000 UNIT) PO TABS: 1000.0000 [IU] | ORAL_TABLET | Freq: Every day | ORAL | 3 refills | Status: AC

## 2016-03-29 MED ORDER — FLUOXETINE HCL 20 MG PO TABS: 20.0000 mg | ORAL_TABLET | Freq: Every day | ORAL | 5 refills | Status: DC

## 2016-03-29 MED ORDER — FINASTERIDE 5 MG PO TABS: 5.0000 mg | ORAL_TABLET | Freq: Every day | ORAL | 3 refills | Status: AC

## 2016-03-29 MED ORDER — LOVASTATIN 20 MG PO TABS: 20.0000 mg | ORAL_TABLET | Freq: Every day | ORAL | 3 refills | Status: DC

## 2016-03-29 MED ORDER — LINACLOTIDE 290 MCG PO CAPS: 290.0000 ug | ORAL_CAPSULE | Freq: Every day | ORAL | 1 refills | Status: DC

## 2016-03-29 MED ORDER — METOPROLOL SUCCINATE ER 25 MG PO TB24: 75.0000 mg | ORAL_TABLET | Freq: Every day | ORAL | 1 refills | Status: DC

## 2016-03-29 NOTE — Progress Notes (Signed)
Social Work  Discharge Note  The overall goal for the admission was met for:   Discharge location: Yes - returning [place]  Length of Stay: Yes - 13 days  Discharge activity level: Yes - modified independent  Home/community participation: Yes  Services provided included: MD, RD, PT, OT, SLP, RN, TR, Pharmacy and [place] [employer]  Follow-up services arranged: Home Health: PT via [facility] and Patient/Family has no preference for HH/DME agencies  Comments (or additional information):  Patient/Family verbalized understanding of follow-up arrangements: Yes  Individual responsible for coordination of the follow-up plan: pt  Confirmed correct DME delivered: NA -no needs    [name]

## 2016-03-29 NOTE — Progress Notes (Signed)
[facility] PHYSICAL MEDICINE & REHABILITATION     PROGRESS NOTE  Subjective/Complaints:  Pt seen laying in bed this AM.  He slept well overnight.  He is happy to go home 1 day early.   ROS: Denies CP, SOB, N/V/D.  Objective: Vital Signs: Blood pressure (!) 170/69, pulse 71, temperature 97.7 F (36.5 C), temperature source Oral, resp. rate 18, weight 89.7 kg (197 lb 11.2 oz), SpO2 96 %. No results found.  Recent Labs  03/26/16 1047 03/29/16 0612  WBC 7.7 6.8  HGB 12.8* 13.0  HCT 39.0 40.0  PLT 197 199    Recent Labs  03/26/16 1047 03/29/16 0612  NA 135 136  K 5.0 4.6  CL 108 105  GLUCOSE 180* 115*  BUN 37* 33*  CREATININE 2.77* 2.22*  CALCIUM 9.2 8.9   CBG (last 3)   Recent Labs  03/28/16 1650 03/28/16 2138 03/29/16 0649  GLUCAP 145* 109* 102*    Wt Readings from Last 3 Encounters:  03/21/16 89.7 kg (197 lb 11.2 oz)  03/13/16 99.8 kg (220 lb)  01/03/16 91.6 kg (202 lb)    Physical Exam:  BP (!) 170/69 (BP Location: Left Arm)   Pulse 71   Temp 97.7 F (36.5 C) (Oral)   Resp 18   Wt 89.7 kg (197 lb 11.2 oz)   SpO2 96%   BMI 31.91 kg/m  Constitutional: No distress. Vital signs reviewed. Well-developed.  HENT: Normocephalicand atraumatic.  Eyes: Blind  Cardiovascular: RRR. No JVD. Respiratory: Effort normal breath sounds normal.  GI: Soft. Bowel sounds are normal.  Musculoskeletal: He exhibits no edema. No tenderness.  Neurological: He is alertand oriented.  Follows commands. Fair awareness and insight. Right central 7.  Motor: RUE 4+-5/5 deltoid, biceps, triceps, wrist, hand. LUE 5/5 (stable).  RLE 4+-5/5 HF, 4/5 KE and ADF/PF.  LLE 5/5.  Skin: Warm and dry. Intact.  Psychiatric: He has a normal mood and affect. His behavior is normal.    Assessment/Plan: 1. 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 Physiatrist is  providing close team supervision and 24 hour management of active medical problems listed below. Physiatrist and rehab team continue to assess barriers to discharge/monitor patient progress toward functional and medical goals.  Function:  Bathing Bathing position   Position: Wheelchair/chair at sink  Bathing parts Body parts bathed by patient: Right arm, Left arm, Abdomen, Chest, Front perineal area, Buttocks, Right upper leg, Left upper leg, Right lower leg, Left lower leg, Back Body parts bathed by helper: Back  Bathing assist Assist Level: Supervision or verbal cues      Upper Body Dressing/Undressing Upper body dressing   What is the patient wearing?: Pull over shirt/dress     Pull over shirt/dress - Perfomed by patient: Thread/unthread right sleeve, Thread/unthread left sleeve, Put head through opening, Pull shirt over trunk Pull over shirt/dress - Perfomed by helper: Thread/unthread right sleeve        Upper body assist Assist Level: More than reasonable time   Set up : To obtain clothing/put away  Lower Body Dressing/Undressing Lower body dressing   What is the patient wearing?: Underwear, Pants, Socks, Shoes Underwear - Performed by patient: Thread/unthread right underwear leg, Thread/unthread left underwear leg, Pull underwear up/down   Pants- Performed by patient: Thread/unthread right pants leg, Thread/unthread left pants leg, Pull pants up/down, Fasten/unfasten pants Pants- Performed by helper: Thread/unthread right pants leg Non-skid slipper socks-  Performed by patient: Don/doff right sock, Don/doff left sock   Socks - Performed by patient: Don/doff right sock, Don/doff left sock   Shoes - Performed by patient: Don/doff right shoe, Don/doff left shoe            Lower body assist Assist for lower body dressing: More than reasonable time   Set up : To obtain clothing/put away  Toileting Toileting Toileting activity did not occur: No continent bowel/bladder  event Toileting steps completed by patient: Adjust clothing prior to toileting, Performs perineal hygiene, Adjust clothing after toileting Toileting steps completed by helper: Performs perineal hygiene Toileting Assistive Devices: Grab bar or rail  Toileting assist Assist level: More than reasonable time   Transfers Chair/bed transfer   Chair/bed transfer method: Stand pivot Chair/bed transfer assist level: Set up only Chair/bed transfer assistive device: Librarian, academic     Max distance: 161 ft Assist level: Supervision or verbal cues   Wheelchair Wheelchair activity did not occur: N/A        Cognition Comprehension Comprehension assist level: Understands complex 90% of the time/cues 10% of the time  Expression Expression assist level: Expresses basic needs/ideas: With no assist  Social Interaction Social Interaction assist level: Interacts appropriately 90% of the time - Needs monitoring or encouragement for participation or interaction.  Problem Solving Problem solving assist level: Solves basic 90% of the time/requires cueing < 10% of the time  Memory Memory assist level: Recognizes or recalls 90% of the time/requires cueing < 10% of the time    Medical Problem List and Plan: 1. Right hemiparesisand balance deficitssecondary to left parietal infarct  D/c today  Will see patient for transitional care management in 1-2 weeks 2. DVT Prophylaxis/Anticoagulation: SCDs. Monitor for any signs of DVT 3. Pain Management: Tylenol as needed 4. Mood: Provide emotional support. Continue Prozac 20 mg daily. 5. Neuropsych: This patient iscapable of making decisions on hisown behalf. 6. Skin/Wound Care: Routine skin checks 7. Fluids/Electrolytes/Nutrition: Routine I&Os  education regarding appropriate food choices 8.Legally blind. Patient used a cane prior to admission was able to navigate about his apartment 9.Hypertension.   Cont Toprol-XL 75 mg daily.    Orthostatics negative  Cozaar 100 mg changed to qhs 2/28  Hydralazine 10 qhs started 3/2, increased to TID on 3/6  Improving, but labile at times 10.Diabetes mellitus peripheral neuropathy. Hemoglobin A1c 7.2. Trandjenta 5 mg daily. Check blood sugars before meals and at bedtime. Diabetic teaching appropriate.  Overall controlled 3/8 11.CAD. Continue aspirin therapy. No chest pain or shortness of breath 12.BPH.Proscar 5mg  daily.   PVR 3 negative 13.Hyperlipidemia. Pravachol 20 mg daily 14.Constipation.linzess 290 mcg daily, Miralax daily 15. CKD III  With AKI, Cr 2.33 on 3/8  IVF qhs started 3/2  Will need close follow up as outpt with labs  Cont to monitor  Encourage adequate po intake   LOS (Days) 13 A FACE TO FACE EVALUATION WAS PERFORMED  Jsean Taussig Lorie Phenix 03/29/2016 9:37 AM

## 2016-03-29 NOTE — Progress Notes (Signed)
Pt discharged to home with family. Discharge instructions given to pt and family by Silvestre Mesi. PA with verbal understandingl

## 2016-03-29 NOTE — Patient Care Conference (Signed)
Inpatient RehabilitationTeam Conference and Plan of Care Update Date: 03/28/2016   Time: 2:30 PM    Patient Name: Caleel Kiner Sr.      Medical Record Number: [id number]  Date of Birth: July 07, 1940 Sex: Male         Room/Bed: 4W17C/4W17C-01 Payor Info: Payor: HU[employer]NA MEDICARE / Plan: HU[employer] MEDICARE HMO / Product Type: *No Product type* /    Admitting Diagnosis: L CVA  Admit Date/Time:  03/16/2016  4:10 PM Admission Comments: No comment available   Primary Diagnosis:  Parietal lobe infarction [facility]) Principal Problem: Parietal lobe infarction [facility])  Patient Active Problem List   Diagnosis Date Noted  . AKI (acute kidney injury) ([place])   . Type 2 diabetes mellitus with peripheral neuropathy (HCC)   . Labile blood pressure   . Stage 3 chronic kidney disease   . Benign essential HTN   . Blind   . 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 Acute cerebrovascular accident (CVA) ([place]) 03/13/2016  . Essential hypertension 03/13/2016  . Pruritus 01/03/2016  . UTI (urinary tract infection) 10/27/2015  . Impacted cerumen of left ear 10/03/2015  . Sore throat in the morning 09/23/2015  . Situational mixed anxiety and depressive disorder 06/17/2015  . Neuropathic pain 03/09/2015  . Insomnia 03/09/2015  . Left arm weakness 08/13/2014  . Herpes zoster 07/13/2014  . Constipation 07/13/2014  . Epididymitis 02/23/2014  . Pain in joint, ankle and foot 08/04/2013  . Dry mouth 05/05/2013  . 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 Dizziness 11/01/2011  . Obesity (BMI 30-39.9)   . Chronic kidney disease (CKD), stage IV (severe) ([place])   . Elevated PSA 08/17/2011  . Bladder neck obstruction 04/13/2011  . DM (diabetes mellitus), type 2 with ophthalmic complications ([place])   . Glaucoma associated with ocular disorder   . Carpal tunnel sundrome   . Erectile dysfunction   . GERD   . Cervical disc disorder with radiculopathy   . Hyperlipidemia   . Cholelithiases   . Hypertensive heart disease   .  Coronary atherosclerosis     Expected Discharge Date: Expected Discharge Date: 03/29/16  Team Members Present: Physician leading conference: Dr. Delice Lesch Social Worker Present: Lennart Pall, LCSW Nurse Present: Elliot Cousin, RN PT Present: Phylliss Bob, PTA;Rodney Lajuana Matte, PT OT Present: Benay Pillow, OT SLP Present: Weston Anna, SLP PPS Coordinator present : Daiva Nakayama, RN, CRRN     Current Status/Progress Goal Weekly Team Focus  Medical    Right hemiparesis and balance deficits secondary to left parietal infarct  Improve mobility, safety, AKI, BP  See above   Bowel/Bladder   continent of Bowel and Bladder LBM 03/26/16  min asist  continue plan of care   Swallow/Nutrition/ Hydration             ADL's   mod I - supervision for all self care tasks. Supervision with use of SPC in unfamilar hospital environment. Mod I during home evaluation on 3/6  mod I overall with supervision for shower and toilet transfer  d/c planning, balance, functional transfers, strengthening, safety   Mobility   supervision for gait, mod I transfers and bed mobility  Supervision/mod I functional mobilty  d/c planning   Communication             Safety/Cognition/ Behavioral Observations  no unsafe behavior  min assist  monitor q shift   Pain   no complaints of pain  keep pain less than or equal to 2  monitor pain q shift  Skin   skin is intact  no skin breakdown this admission  continue plan of care    Rehab Goals Patient on target to meet rehab goals: Yes *See Care Plan and progress notes for long and short-term goals.  Barriers to Discharge: Mobility, safety, AKI, HTN    Possible Resolutions to Barriers:  Therapies, adaptive equip, optimize HTN meds, follow labs, IVF    Discharge Planning/Teaching Needs:  [place] assistance of family  Teaching to be done prior to d/c   Team Discussion:  Adjusting BP meds and will recheck labs in  morning.  Home eval went really well and pt able to be mod ind in his home environment.  Ready to d/c tomorrow if medically cleared.  Revisions to Treatment Plan:  None   Continued Need for Acute Rehabilitation Level of Care: The patient requires daily medical management by a physician with specialized training in physical medicine and rehabilitation for the following conditions: Daily direction of a multidisciplinary physical rehabilitation program to ensure safe treatment while eliciting the highest outcome that is of practical value to the patient.: Yes Daily medical management of patient stability for increased activity during participation in an intensive rehabilitation regime.: Yes Daily analysis of laboratory values and/or radiology reports with any subsequent need for medication adjustment of medical intervention for : Neurological problems;Blood pressure problems;Renal problems  Maxie Debose 03/29/2016, 12:57 PM

## 2016-03-30 ENCOUNTER — Other Ambulatory Visit: Payer: Self-pay | Admitting: *Deleted

## 2016-03-30 ENCOUNTER — Telehealth: Payer: Self-pay | Admitting: *Deleted

## 2016-03-30 DIAGNOSIS — E1139 Type 2 diabetes mellitus with other diabetic ophthalmic complication: Secondary | ICD-10-CM | POA: Diagnosis not present

## 2016-03-30 DIAGNOSIS — I69351 Hemiplegia and hemiparesis following cerebral infarction affecting right dominant side: Secondary | ICD-10-CM | POA: Diagnosis not present

## 2016-03-30 DIAGNOSIS — E1122 Type 2 diabetes mellitus with diabetic chronic kidney disease: Secondary | ICD-10-CM | POA: Diagnosis not present

## 2016-03-30 DIAGNOSIS — E1142 Type 2 diabetes mellitus with diabetic polyneuropathy: Secondary | ICD-10-CM | POA: Diagnosis not present

## 2016-03-30 DIAGNOSIS — Z7982 Long term (current) use of aspirin: Secondary | ICD-10-CM | POA: Diagnosis not present

## 2016-03-30 DIAGNOSIS — I2510 Atherosclerotic heart disease of native coronary artery without angina pectoris: Secondary | ICD-10-CM | POA: Diagnosis not present

## 2016-03-30 DIAGNOSIS — H548 Legal blindness, as defined in USA: Secondary | ICD-10-CM | POA: Diagnosis not present

## 2016-03-30 DIAGNOSIS — I129 Hypertensive chronic kidney disease with stage 1 through stage 4 chronic kidney disease, or unspecified chronic kidney disease: Secondary | ICD-10-CM | POA: Diagnosis not present

## 2016-03-30 DIAGNOSIS — N184 Chronic kidney disease, stage 4 (severe): Secondary | ICD-10-CM | POA: Diagnosis not present

## 2016-03-30 NOTE — Patient Outreach (Signed)
[place] [facility]) Care Management  03/30/2016  Caleb Dittus Sr. [birth year] [id number]  Referral from [facility] Member discharged:  Per chart review patient has received Transition of care call from primary care office. Patient is not eligible for [facility]  Transition of Care Program.  Plan: Send to care management assistant to close out.   Sherrin Daisy, RN BSN [place] Management Coordinator [facility] Care Management  [phone]

## 2016-03-30 NOTE — Telephone Encounter (Signed)
Transitional Care call-spoke with Mr Haun    1. Are you/is patient experiencing any problems since coming home? Are there any questions regarding any aspect of care?No 2. Are there any questions regarding medications administration/dosing? Are meds being taken as prescribed? Patient should review meds with caller to confirm "Daughter is picking them up now" 3. Have there been any falls?No 4. Has Home Health been to the house and/or have they contacted you? If not, have you tried to contact them? Can we help you contact them?Yes therapy has contacted me 5. Are bowels and bladder emptying properly? Are there any unexpected incontinence issues? If applicable, is patient following bowel/bladder programs? No problems 6. Any fevers, problems with breathing, unexpected pain? No 7. Are there any skin problems or new areas of breakdown? No 8. Has the patient/family member arranged specialty MD follow up (ie cardiology/neurology/renal/surgical/etc)?  Can we help arrange? No, appt given for Dr Posey Pronto  9. Does the patient need any other services or support that we can help arrange? No 10. Are caregivers following through as expected in assisting the patient? Yes 11. Has the patient quit smoking, drinking alcohol, or using drugs as recommended? N/A  Appointment 04/11/16 @10 :00 Arrive by 9:30 for appt with Dr Posey Pronto [address] [phone]

## 2016-04-03 ENCOUNTER — Ambulatory Visit: Payer: Commercial Managed Care - HMO | Admitting: Internal Medicine

## 2016-04-03 DIAGNOSIS — E1139 Type 2 diabetes mellitus with other diabetic ophthalmic complication: Secondary | ICD-10-CM | POA: Diagnosis not present

## 2016-04-03 DIAGNOSIS — I2510 Atherosclerotic heart disease of native coronary artery without angina pectoris: Secondary | ICD-10-CM | POA: Diagnosis not present

## 2016-04-03 DIAGNOSIS — E1122 Type 2 diabetes mellitus with diabetic chronic kidney disease: Secondary | ICD-10-CM | POA: Diagnosis not present

## 2016-04-03 DIAGNOSIS — H548 Legal blindness, as defined in USA: Secondary | ICD-10-CM | POA: Diagnosis not present

## 2016-04-03 DIAGNOSIS — E1142 Type 2 diabetes mellitus with diabetic polyneuropathy: Secondary | ICD-10-CM | POA: Diagnosis not present

## 2016-04-03 DIAGNOSIS — N184 Chronic kidney disease, stage 4 (severe): Secondary | ICD-10-CM | POA: Diagnosis not present

## 2016-04-03 DIAGNOSIS — I129 Hypertensive chronic kidney disease with stage 1 through stage 4 chronic kidney disease, or unspecified chronic kidney disease: Secondary | ICD-10-CM | POA: Diagnosis not present

## 2016-04-03 DIAGNOSIS — I69351 Hemiplegia and hemiparesis following cerebral infarction affecting right dominant side: Secondary | ICD-10-CM | POA: Diagnosis not present

## 2016-04-03 DIAGNOSIS — Z7982 Long term (current) use of aspirin: Secondary | ICD-10-CM | POA: Diagnosis not present

## 2016-04-06 DIAGNOSIS — I2510 Atherosclerotic heart disease of native coronary artery without angina pectoris: Secondary | ICD-10-CM | POA: Diagnosis not present

## 2016-04-06 DIAGNOSIS — H548 Legal blindness, as defined in USA: Secondary | ICD-10-CM | POA: Diagnosis not present

## 2016-04-06 DIAGNOSIS — Z7982 Long term (current) use of aspirin: Secondary | ICD-10-CM | POA: Diagnosis not present

## 2016-04-06 DIAGNOSIS — E1122 Type 2 diabetes mellitus with diabetic chronic kidney disease: Secondary | ICD-10-CM | POA: Diagnosis not present

## 2016-04-06 DIAGNOSIS — N184 Chronic kidney disease, stage 4 (severe): Secondary | ICD-10-CM | POA: Diagnosis not present

## 2016-04-06 DIAGNOSIS — E1139 Type 2 diabetes mellitus with other diabetic ophthalmic complication: Secondary | ICD-10-CM | POA: Diagnosis not present

## 2016-04-06 DIAGNOSIS — E1142 Type 2 diabetes mellitus with diabetic polyneuropathy: Secondary | ICD-10-CM | POA: Diagnosis not present

## 2016-04-06 DIAGNOSIS — I69351 Hemiplegia and hemiparesis following cerebral infarction affecting right dominant side: Secondary | ICD-10-CM | POA: Diagnosis not present

## 2016-04-06 DIAGNOSIS — I129 Hypertensive chronic kidney disease with stage 1 through stage 4 chronic kidney disease, or unspecified chronic kidney disease: Secondary | ICD-10-CM | POA: Diagnosis not present

## 2016-04-06 NOTE — Progress Notes (Signed)
I agree with the above plan 

## 2016-04-10 DIAGNOSIS — E1142 Type 2 diabetes mellitus with diabetic polyneuropathy: Secondary | ICD-10-CM | POA: Diagnosis not present

## 2016-04-10 DIAGNOSIS — I69351 Hemiplegia and hemiparesis following cerebral infarction affecting right dominant side: Secondary | ICD-10-CM | POA: Diagnosis not present

## 2016-04-10 DIAGNOSIS — I129 Hypertensive chronic kidney disease with stage 1 through stage 4 chronic kidney disease, or unspecified chronic kidney disease: Secondary | ICD-10-CM | POA: Diagnosis not present

## 2016-04-10 DIAGNOSIS — Z7982 Long term (current) use of aspirin: Secondary | ICD-10-CM | POA: Diagnosis not present

## 2016-04-10 DIAGNOSIS — I2510 Atherosclerotic heart disease of native coronary artery without angina pectoris: Secondary | ICD-10-CM | POA: Diagnosis not present

## 2016-04-10 DIAGNOSIS — N184 Chronic kidney disease, stage 4 (severe): Secondary | ICD-10-CM | POA: Diagnosis not present

## 2016-04-10 DIAGNOSIS — E1139 Type 2 diabetes mellitus with other diabetic ophthalmic complication: Secondary | ICD-10-CM | POA: Diagnosis not present

## 2016-04-10 DIAGNOSIS — E1122 Type 2 diabetes mellitus with diabetic chronic kidney disease: Secondary | ICD-10-CM | POA: Diagnosis not present

## 2016-04-10 DIAGNOSIS — H548 Legal blindness, as defined in USA: Secondary | ICD-10-CM | POA: Diagnosis not present

## 2016-04-11 ENCOUNTER — Encounter: Payer: Medicare HMO | Attending: Physical Medicine & Rehabilitation | Admitting: Physical Medicine & Rehabilitation

## 2016-04-11 ENCOUNTER — Encounter: Payer: Self-pay | Admitting: Physical Medicine & Rehabilitation

## 2016-04-11 VITALS — BP 195/75 | HR 70 | Resp 14

## 2016-04-11 DIAGNOSIS — Z833 Family history of diabetes mellitus: Secondary | ICD-10-CM | POA: Insufficient documentation

## 2016-04-11 DIAGNOSIS — I2510 Atherosclerotic heart disease of native coronary artery without angina pectoris: Secondary | ICD-10-CM | POA: Insufficient documentation

## 2016-04-11 DIAGNOSIS — I10 Essential (primary) hypertension: Secondary | ICD-10-CM | POA: Diagnosis not present

## 2016-04-11 DIAGNOSIS — H547 Unspecified visual loss: Secondary | ICD-10-CM

## 2016-04-11 DIAGNOSIS — I129 Hypertensive chronic kidney disease with stage 1 through stage 4 chronic kidney disease, or unspecified chronic kidney disease: Secondary | ICD-10-CM | POA: Diagnosis not present

## 2016-04-11 DIAGNOSIS — E1142 Type 2 diabetes mellitus with diabetic polyneuropathy: Secondary | ICD-10-CM | POA: Insufficient documentation

## 2016-04-11 DIAGNOSIS — G5600 Carpal tunnel syndrome, unspecified upper limb: Secondary | ICD-10-CM | POA: Diagnosis not present

## 2016-04-11 DIAGNOSIS — I169 Hypertensive crisis, unspecified: Secondary | ICD-10-CM

## 2016-04-11 DIAGNOSIS — I69351 Hemiplegia and hemiparesis following cerebral infarction affecting right dominant side: Secondary | ICD-10-CM | POA: Insufficient documentation

## 2016-04-11 DIAGNOSIS — Z9049 Acquired absence of other specified parts of digestive tract: Secondary | ICD-10-CM | POA: Insufficient documentation

## 2016-04-11 DIAGNOSIS — N183 Chronic kidney disease, stage 3 unspecified: Secondary | ICD-10-CM

## 2016-04-11 DIAGNOSIS — Z8249 Family history of ischemic heart disease and other diseases of the circulatory system: Secondary | ICD-10-CM | POA: Diagnosis not present

## 2016-04-11 DIAGNOSIS — K5900 Constipation, unspecified: Secondary | ICD-10-CM | POA: Insufficient documentation

## 2016-04-11 DIAGNOSIS — I6389 Other cerebral infarction: Secondary | ICD-10-CM

## 2016-04-11 DIAGNOSIS — H548 Legal blindness, as defined in USA: Secondary | ICD-10-CM | POA: Diagnosis not present

## 2016-04-11 DIAGNOSIS — I252 Old myocardial infarction: Secondary | ICD-10-CM | POA: Diagnosis not present

## 2016-04-11 DIAGNOSIS — Z87891 Personal history of nicotine dependence: Secondary | ICD-10-CM | POA: Diagnosis not present

## 2016-04-11 DIAGNOSIS — I638 Other cerebral infarction: Secondary | ICD-10-CM | POA: Diagnosis not present

## 2016-04-11 DIAGNOSIS — Z841 Family history of disorders of kidney and ureter: Secondary | ICD-10-CM | POA: Insufficient documentation

## 2016-04-11 DIAGNOSIS — E1122 Type 2 diabetes mellitus with diabetic chronic kidney disease: Secondary | ICD-10-CM | POA: Insufficient documentation

## 2016-04-11 DIAGNOSIS — K219 Gastro-esophageal reflux disease without esophagitis: Secondary | ICD-10-CM | POA: Insufficient documentation

## 2016-04-11 DIAGNOSIS — H409 Unspecified glaucoma: Secondary | ICD-10-CM | POA: Insufficient documentation

## 2016-04-11 NOTE — Progress Notes (Signed)
Subjective:    Patient ID: Caleb Nim Sr., male    DOB: 1940-12-18, 76 y.o.   MRN: [id number]  HPI 76 year old right-handed male, who is legally blind with history of CAD maintained on aspirin, type 2 diabetes mellitus presents for transitional care management after receiving CIR for left parietal infarct.   DATE OF ADMISSION:  03/13/2016 DATE OF DISCHARGE:  03/28/2016  At that time, he was instructed to follow up with Neurology, which he has not made an appointment with.  He made an appointment with his PCP. Denies falls. His BP is extremely elevated, but states it has been been taking his medications.  He has been checking CBGs, running ~110.  Pt requests a letter stating his son can live with patient.   DME: None needed Therapies: 3/week. Mobility: Cane for mobility.   Pain Inventory Average Pain 0 Pain Right Now 0 My pain is no pain  In the last 24 hours, has pain interfered with the following? General activity 0 Relation with others 0 Enjoyment of life 0 What TIME of day is your pain at its worst? no pain Sleep (in general) no pain  Pain is worse with: no pain Pain improves with: no pain Relief from Meds: no pain  Mobility walk with assistance use a cane needs help with transfers  Function disabled: date disabled . I need assistance with the following:  meal prep, household duties and shopping  Neuro/Psych No problems in this area  Prior Studies hospital follow up  Physicians involved in your care hospital follow up   Family History  Problem Relation Age of Onset  . Hypertension Mother   . Kidney disease Father   . Hypertension Father   . Coronary artery disease      1st degree Male relative <60  . Diabetes      1st degree relative   Social History   Social History  . Marital status: Legally Separated    Spouse name: N/A  . Number of children: N/A  . Years of education: N/A   Occupational History  . Retired    Social History Main  Topics  . Smoking status: Former Research scientist (life sciences)  . Smokeless tobacco: Never Used  . Alcohol use No  . Drug use: No  . Sexual activity: No   Other Topics Concern  . None   Social History Narrative   Regular Exercise-No   Married, but separated   Past Surgical History:  Procedure Laterality Date  . CHOLECYSTECTOMY     Past Medical History:  Diagnosis Date  . Blindness   . CAD (coronary artery disease)   . Cholelithiasis   . CTS (carpal tunnel syndrome)    Left  . DM type 2 with diabetic peripheral neuropathy ([place])   . ED (erectile dysfunction)   . Elevated PSA   . GERD (gastroesophageal reflux disease)   . Glaucoma   . HTN (hypertension)   . 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 MI (myocardial infarction) 1991  . Thyroid nodule   . Type II or unspecified type diabetes mellitus without mention of complication, not stated as uncontrolled    BP (!) 195/75   Pulse 70   Resp 14   SpO2 98%   Opioid Risk Score:   Fall Risk Score:  `1  Depression screen PHQ 2/9  Depression screen [facility] 2/9 04/22/2015 04/21/2014  Decreased Interest 0 0  Down, Depressed, Hopeless 0 0  PHQ - 2 Score 0 0  Review of Systems  Constitutional: Negative.   HENT: Negative.   Eyes: Positive for visual disturbance.  Respiratory: Negative.   Cardiovascular: Negative.   Gastrointestinal: Negative.   Endocrine: Negative.   Genitourinary: Positive for frequency and urgency.  Musculoskeletal: Positive for back pain.  Skin: Negative.   Allergic/Immunologic: Negative.   Neurological: Negative.   Hematological: Negative.   Psychiatric/Behavioral: Negative.        Objective:   Physical Exam Constitutional: No distress. Vital signs reviewed. Well-developed.  HENT: Normocephalic and atraumatic.  Eyes: Blind  Cardiovascular: RRR. No JVD. Respiratory: Effort normal breath sounds normal.  GI: Soft. Bowel sounds are normal.  Musculoskeletal: He exhibits no edema. No tenderness.    Neurological: He is alert and oriented.  Follows commands. Fair awareness and insight.  Motor: RUE 5/5 deltoid, biceps, triceps, wrist, hand. LUE 5/5. RLE 5/5 HF, 5/5 KE and ADF/PF.  LLE 5/5.  Skin: Warm and dry. Intact.  Psychiatric: He has a normal mood and affect. His behavior is normal.     Assessment & Plan:  76 year old right-handed male, who is legally blind with history of CAD maintained on aspirin, type 2 diabetes mellitus presents for transitional care management after receiving CIR for left parietal infarct.   1. Right hemiparesis and balance deficits secondary to left parietal infarct  Cont therapies  Follow up with Neurology, need appointment  Cont meds  2. Legally blind  Will write letter for assistance at home.   3. Hypertension.   Elevated in office with hypertensive crisis  Cont meds  Pt has appointment with PCP tomorrow  4. Diabetes mellitus peripheral neuropathy  Cont meds  5. Constipation  Improved  6. CKD III  Labs per PCP, pt states he expects to have labs drawn tomorrow   Meds reviewed Referrals reviewed, needs appointment with Neurology All questions answered

## 2016-04-12 ENCOUNTER — Telehealth: Payer: Self-pay | Admitting: Internal Medicine

## 2016-04-12 ENCOUNTER — Other Ambulatory Visit (INDEPENDENT_AMBULATORY_CARE_PROVIDER_SITE_OTHER): Payer: Medicare HMO

## 2016-04-12 ENCOUNTER — Ambulatory Visit (INDEPENDENT_AMBULATORY_CARE_PROVIDER_SITE_OTHER): Payer: Medicare HMO | Admitting: Internal Medicine

## 2016-04-12 ENCOUNTER — Encounter: Payer: Self-pay | Admitting: Internal Medicine

## 2016-04-12 VITALS — BP 132/60 | HR 72 | Temp 97.6°F | Resp 16 | Ht 66.0 in | Wt 202.0 lb

## 2016-04-12 DIAGNOSIS — E11311 Type 2 diabetes mellitus with unspecified diabetic retinopathy with macular edema: Secondary | ICD-10-CM | POA: Diagnosis not present

## 2016-04-12 DIAGNOSIS — I119 Hypertensive heart disease without heart failure: Secondary | ICD-10-CM | POA: Diagnosis not present

## 2016-04-12 DIAGNOSIS — I10 Essential (primary) hypertension: Secondary | ICD-10-CM

## 2016-04-12 DIAGNOSIS — N184 Chronic kidney disease, stage 4 (severe): Secondary | ICD-10-CM | POA: Diagnosis not present

## 2016-04-12 DIAGNOSIS — I639 Cerebral infarction, unspecified: Secondary | ICD-10-CM

## 2016-04-12 DIAGNOSIS — F4323 Adjustment disorder with mixed anxiety and depressed mood: Secondary | ICD-10-CM

## 2016-04-12 LAB — BASIC METABOLIC PANEL
BUN: 23 mg/dL (ref 6–23)
CALCIUM: 9 mg/dL (ref 8.4–10.5)
CO2: 28 meq/L (ref 19–32)
Chloride: 107 mEq/L (ref 96–112)
Creatinine, Ser: 2.06 mg/dL — ABNORMAL HIGH (ref 0.40–1.50)
GFR: 40.52 mL/min — AB (ref >60.00)
Glucose, Bld: 169 mg/dL — ABNORMAL HIGH (ref 70–99)
Potassium: 4.1 mEq/L (ref 3.5–5.1)
Sodium: 139 mEq/L (ref 135–145)

## 2016-04-12 LAB — HEMOGLOBIN A1C: Hgb A1c MFr Bld: 7.5 % — ABNORMAL HIGH (ref 4.6–6.5)

## 2016-04-12 NOTE — Assessment & Plan Note (Addendum)
Cont w/Cozaar, Toprol BP Readings from Last 3 Encounters:  04/12/16 132/60  04/11/16 (!) 195/75  03/29/16 (!) 170/69

## 2016-04-12 NOTE — Assessment & Plan Note (Signed)
[place]

## 2016-04-12 NOTE — Assessment & Plan Note (Signed)
On Cozaar, Toprol

## 2016-04-12 NOTE — Telephone Encounter (Signed)
Routing to dr plotnikov, do you know of a way patient could get this service free?  Please advise, I will call patient back, thanks

## 2016-04-12 NOTE — Telephone Encounter (Signed)
Pt called and wanted to know if Dr could help him get on a life alert ?

## 2016-04-12 NOTE — Assessment & Plan Note (Signed)
Right hemiparesisand balance deficitssecondary to left parietal infarct ASA, Cozaar, Toprol

## 2016-04-12 NOTE — Assessment & Plan Note (Signed)
Fluoxetine  

## 2016-04-12 NOTE — Progress Notes (Signed)
Subjective:  Patient ID: Caleb Nim Sr., male    DOB: 10-16-1940  Age: 76 y.o. MRN: [id number]  CC: Hospitalization Follow-up (CVA- stable, )   HPI Caleb Caligiuri Sr. presents for post-hosp f/u. D/c'd from rehab on 3/7: Right hemiparesisand balance deficitssecondary to left parietal infarct F/u DM, OA, GERD f/u  Outpatient Medications Prior to Visit  Medication Sig Dispense Refill  . aspirin EC 325 MG EC tablet Take 1 tablet (325 mg total) by mouth daily. 30 tablet 0  . cholecalciferol (VITAMIN D) 1000 units tablet Take 1 tablet (1,000 Units total) by mouth daily. 100 tablet 3  . Emollient (EUCERIN) lotion Apply topically as needed for dry skin. Use bid 480 mL 3  . finasteride (PROSCAR) 5 MG tablet Take 1 tablet (5 mg total) by mouth daily. For Prostate 90 tablet 3  . FLUoxetine (PROZAC) 20 MG tablet Take 1 tablet (20 mg total) by mouth daily. 30 tablet 5  . hydrALAZINE (APRESOLINE) 10 MG tablet Take 1 tablet (10 mg total) by mouth every 8 (eight) hours. 90 tablet 1  . linaclotide (LINZESS) 290 MCG CAPS capsule Take 1 capsule (290 mcg total) by mouth daily. 30 capsule 1  . linagliptin (TRADJENTA) 5 MG TABS tablet Take 1 tablet (5 mg total) by mouth daily. 30 tablet 1  . losartan (COZAAR) 100 MG tablet Take 1 tablet (100 mg total) by mouth at bedtime. 30 tablet 1  . lovastatin (MEVACOR) 20 MG tablet Take 1 tablet (20 mg total) by mouth daily. For Cholesterol 90 tablet 3  . metoprolol succinate (TOPROL-XL) 25 MG 24 hr tablet Take 3 tablets (75 mg total) by mouth daily. Take with or immediately following a meal. 30 tablet 1  . pantoprazole (PROTONIX) 40 MG tablet Take 1 tablet (40 mg total) by mouth daily. 30 tablet 0  . polyethylene glycol (MIRALAX / GLYCOLAX) packet Take 17 g by mouth daily. (Patient taking differently: Take 17 g by mouth daily as needed for moderate constipation. ) 30 each 5   No facility-administered medications prior to visit.     ROS Review of Systems    Constitutional: Negative for appetite change, fatigue and unexpected weight change.  HENT: Negative for congestion, nosebleeds, sneezing, sore throat and trouble swallowing.   Eyes: Positive for visual disturbance. Negative for itching.  Respiratory: Negative for cough.   Cardiovascular: Negative for chest pain, palpitations and leg swelling.  Gastrointestinal: Negative for abdominal distention, blood in stool, diarrhea and nausea.  Genitourinary: Negative for frequency and hematuria.  Musculoskeletal: Positive for arthralgias and gait problem. Negative for back pain, joint swelling and neck pain.  Skin: Negative for rash.  Neurological: Positive for dizziness and weakness. Negative for tremors and speech difficulty.  Psychiatric/Behavioral: Negative for agitation, dysphoric mood and sleep disturbance. The patient is nervous/anxious.     Objective:  BP 132/60   Pulse 72   Temp 97.6 F (36.4 C) (Oral)   Resp 16   Ht 5\' 6"  (1.676 m)   Wt 202 lb (91.6 kg)   SpO2 99%   BMI 32.60 kg/m   BP Readings from Last 3 Encounters:  04/12/16 132/60  04/11/16 (!) 195/75  03/29/16 (!) 170/69    Wt Readings from Last 3 Encounters:  04/12/16 202 lb (91.6 kg)  03/21/16 197 lb 11.2 oz (89.7 kg)  03/13/16 220 lb (99.8 kg)    Physical Exam  Constitutional: He is oriented to person, place, and time. He appears well-developed. No distress.  NAD  HENT:  Mouth/Throat: Oropharynx is clear and moist.  Eyes: Conjunctivae are normal. Pupils are equal, round, and reactive to light.  Neck: Normal range of motion. No JVD present. No thyromegaly present.  Cardiovascular: Normal rate, regular rhythm, normal heart sounds and intact distal pulses.  Exam reveals no gallop and no friction rub.   No murmur heard. Pulmonary/Chest: Effort normal and breath sounds normal. No respiratory distress. He has no wheezes. He has no rales. He exhibits no tenderness.  Abdominal: Soft. Bowel sounds are normal. He  exhibits no distension and no mass. There is no tenderness. There is no rebound and no guarding.  Musculoskeletal: Normal range of motion. He exhibits no edema or tenderness.  Lymphadenopathy:    He has no cervical adenopathy.  Neurological: He is alert and oriented to person, place, and time. He has normal reflexes. No cranial nerve deficit. He exhibits normal muscle tone. He displays a negative Romberg sign. Coordination abnormal. Gait normal.  Skin: Skin is warm and dry. No rash noted.  Psychiatric: He has a normal mood and affect. His behavior is normal. Judgment and thought content normal.   Blind Cane   Lab Results  Component Value Date   WBC 6.8 03/29/2016   HGB 13.0 03/29/2016   HCT 40.0 03/29/2016   PLT 199 03/29/2016   GLUCOSE 115 (H) 03/29/2016   CHOL 132 03/14/2016   TRIG 75 03/14/2016   HDL 41 03/14/2016   LDLCALC 76 03/14/2016   ALT 22 03/19/2016   AST 17 03/19/2016   NA 136 03/29/2016   K 4.6 03/29/2016   CL 105 03/29/2016   CREATININE 2.22 (H) 03/29/2016   BUN 33 (H) 03/29/2016   CO2 23 03/29/2016   TSH 0.80 08/13/2011   PSA 8.93 (H) 11/23/2014   INR 1.06 03/13/2016   HGBA1C 7.2 (H) 03/14/2016    No results found.  Assessment & Plan:   There are no diagnoses linked to this encounter. I am having Mr. Knoth maintain his polyethylene glycol, eucerin, aspirin, cholecalciferol, finasteride, FLUoxetine, hydrALAZINE, linagliptin, linaclotide, losartan, lovastatin, metoprolol succinate, and pantoprazole.  No orders of the defined types were placed in this encounter.    Follow-up: No Follow-up on file.  Walker Kehr, MD

## 2016-04-12 NOTE — Progress Notes (Signed)
Pre-visit discussion using our clinic review tool. No additional management support is needed unless otherwise documented below in the visit note.  

## 2016-04-12 NOTE — Telephone Encounter (Signed)
Caleb Sidle,  Do you know how to go about it? Thx

## 2016-04-12 NOTE — Assessment & Plan Note (Signed)
Labs

## 2016-04-13 DIAGNOSIS — Z7982 Long term (current) use of aspirin: Secondary | ICD-10-CM | POA: Diagnosis not present

## 2016-04-13 DIAGNOSIS — H548 Legal blindness, as defined in USA: Secondary | ICD-10-CM | POA: Diagnosis not present

## 2016-04-13 DIAGNOSIS — E1139 Type 2 diabetes mellitus with other diabetic ophthalmic complication: Secondary | ICD-10-CM | POA: Diagnosis not present

## 2016-04-13 DIAGNOSIS — I129 Hypertensive chronic kidney disease with stage 1 through stage 4 chronic kidney disease, or unspecified chronic kidney disease: Secondary | ICD-10-CM | POA: Diagnosis not present

## 2016-04-13 DIAGNOSIS — I69351 Hemiplegia and hemiparesis following cerebral infarction affecting right dominant side: Secondary | ICD-10-CM | POA: Diagnosis not present

## 2016-04-13 DIAGNOSIS — E1142 Type 2 diabetes mellitus with diabetic polyneuropathy: Secondary | ICD-10-CM | POA: Diagnosis not present

## 2016-04-13 DIAGNOSIS — N184 Chronic kidney disease, stage 4 (severe): Secondary | ICD-10-CM | POA: Diagnosis not present

## 2016-04-13 DIAGNOSIS — I2510 Atherosclerotic heart disease of native coronary artery without angina pectoris: Secondary | ICD-10-CM | POA: Diagnosis not present

## 2016-04-13 DIAGNOSIS — E1122 Type 2 diabetes mellitus with diabetic chronic kidney disease: Secondary | ICD-10-CM | POA: Diagnosis not present

## 2016-04-13 NOTE — Telephone Encounter (Signed)
Advised patient he can sign up with the program and pay fee each month---if he needs assistance with cost, call [facility] to see if they have a program designed to assist with requests such as this----call us back if any further questions

## 2016-04-26 DIAGNOSIS — H548 Legal blindness, as defined in USA: Secondary | ICD-10-CM | POA: Diagnosis not present

## 2016-04-26 DIAGNOSIS — E1139 Type 2 diabetes mellitus with other diabetic ophthalmic complication: Secondary | ICD-10-CM | POA: Diagnosis not present

## 2016-04-26 DIAGNOSIS — I129 Hypertensive chronic kidney disease with stage 1 through stage 4 chronic kidney disease, or unspecified chronic kidney disease: Secondary | ICD-10-CM | POA: Diagnosis not present

## 2016-04-26 DIAGNOSIS — E1142 Type 2 diabetes mellitus with diabetic polyneuropathy: Secondary | ICD-10-CM | POA: Diagnosis not present

## 2016-04-26 DIAGNOSIS — E1122 Type 2 diabetes mellitus with diabetic chronic kidney disease: Secondary | ICD-10-CM | POA: Diagnosis not present

## 2016-04-26 DIAGNOSIS — Z87891 Personal history of nicotine dependence: Secondary | ICD-10-CM | POA: Diagnosis not present

## 2016-04-26 DIAGNOSIS — N184 Chronic kidney disease, stage 4 (severe): Secondary | ICD-10-CM | POA: Diagnosis not present

## 2016-04-26 DIAGNOSIS — I2510 Atherosclerotic heart disease of native coronary artery without angina pectoris: Secondary | ICD-10-CM | POA: Diagnosis not present

## 2016-04-26 DIAGNOSIS — Z7984 Long term (current) use of oral hypoglycemic drugs: Secondary | ICD-10-CM | POA: Diagnosis not present

## 2016-04-26 DIAGNOSIS — Z7982 Long term (current) use of aspirin: Secondary | ICD-10-CM | POA: Diagnosis not present

## 2016-04-26 DIAGNOSIS — I69351 Hemiplegia and hemiparesis following cerebral infarction affecting right dominant side: Secondary | ICD-10-CM | POA: Diagnosis not present

## 2016-05-03 DIAGNOSIS — E1142 Type 2 diabetes mellitus with diabetic polyneuropathy: Secondary | ICD-10-CM | POA: Diagnosis not present

## 2016-05-03 DIAGNOSIS — I129 Hypertensive chronic kidney disease with stage 1 through stage 4 chronic kidney disease, or unspecified chronic kidney disease: Secondary | ICD-10-CM | POA: Diagnosis not present

## 2016-05-03 DIAGNOSIS — Z87891 Personal history of nicotine dependence: Secondary | ICD-10-CM | POA: Diagnosis not present

## 2016-05-03 DIAGNOSIS — N184 Chronic kidney disease, stage 4 (severe): Secondary | ICD-10-CM | POA: Diagnosis not present

## 2016-05-03 DIAGNOSIS — Z7982 Long term (current) use of aspirin: Secondary | ICD-10-CM | POA: Diagnosis not present

## 2016-05-03 DIAGNOSIS — E1139 Type 2 diabetes mellitus with other diabetic ophthalmic complication: Secondary | ICD-10-CM | POA: Diagnosis not present

## 2016-05-03 DIAGNOSIS — E1122 Type 2 diabetes mellitus with diabetic chronic kidney disease: Secondary | ICD-10-CM | POA: Diagnosis not present

## 2016-05-03 DIAGNOSIS — I2510 Atherosclerotic heart disease of native coronary artery without angina pectoris: Secondary | ICD-10-CM | POA: Diagnosis not present

## 2016-05-03 DIAGNOSIS — H548 Legal blindness, as defined in USA: Secondary | ICD-10-CM | POA: Diagnosis not present

## 2016-05-03 DIAGNOSIS — I69351 Hemiplegia and hemiparesis following cerebral infarction affecting right dominant side: Secondary | ICD-10-CM | POA: Diagnosis not present

## 2016-05-03 DIAGNOSIS — Z7984 Long term (current) use of oral hypoglycemic drugs: Secondary | ICD-10-CM | POA: Diagnosis not present

## 2016-05-15 ENCOUNTER — Inpatient Hospital Stay (HOSPITAL_COMMUNITY)
Admission: EM | Admit: 2016-05-15 | Discharge: 2016-05-17 | DRG: 065 | Disposition: A | Discharge status: Skilled Nursing Facility | Payer: Medicare HMO | Attending: Nephrology | Admitting: Nephrology

## 2016-05-15 ENCOUNTER — Emergency Department (HOSPITAL_COMMUNITY): Payer: Medicare HMO

## 2016-05-15 ENCOUNTER — Encounter (HOSPITAL_COMMUNITY): Payer: Self-pay | Admitting: Emergency Medicine

## 2016-05-15 ENCOUNTER — Telehealth: Payer: Self-pay | Admitting: Internal Medicine

## 2016-05-15 DIAGNOSIS — W1839XA Other fall on same level, initial encounter: Secondary | ICD-10-CM | POA: Diagnosis present

## 2016-05-15 DIAGNOSIS — I129 Hypertensive chronic kidney disease with stage 1 through stage 4 chronic kidney disease, or unspecified chronic kidney disease: Secondary | ICD-10-CM | POA: Diagnosis present

## 2016-05-15 DIAGNOSIS — Z87891 Personal history of nicotine dependence: Secondary | ICD-10-CM

## 2016-05-15 DIAGNOSIS — Z951 Presence of aortocoronary bypass graft: Secondary | ICD-10-CM | POA: Diagnosis not present

## 2016-05-15 DIAGNOSIS — M6281 Muscle weakness (generalized): Secondary | ICD-10-CM | POA: Diagnosis not present

## 2016-05-15 DIAGNOSIS — N183 Chronic kidney disease, stage 3 unspecified: Secondary | ICD-10-CM | POA: Diagnosis present

## 2016-05-15 DIAGNOSIS — E1151 Type 2 diabetes mellitus with diabetic peripheral angiopathy without gangrene: Secondary | ICD-10-CM | POA: Diagnosis present

## 2016-05-15 DIAGNOSIS — Z841 Family history of disorders of kidney and ureter: Secondary | ICD-10-CM

## 2016-05-15 DIAGNOSIS — E1139 Type 2 diabetes mellitus with other diabetic ophthalmic complication: Secondary | ICD-10-CM | POA: Diagnosis present

## 2016-05-15 DIAGNOSIS — K5900 Constipation, unspecified: Secondary | ICD-10-CM | POA: Diagnosis not present

## 2016-05-15 DIAGNOSIS — I2510 Atherosclerotic heart disease of native coronary artery without angina pectoris: Secondary | ICD-10-CM | POA: Diagnosis present

## 2016-05-15 DIAGNOSIS — Z6833 Body mass index (BMI) 33.0-33.9, adult: Secondary | ICD-10-CM | POA: Diagnosis not present

## 2016-05-15 DIAGNOSIS — E669 Obesity, unspecified: Secondary | ICD-10-CM | POA: Diagnosis present

## 2016-05-15 DIAGNOSIS — H548 Legal blindness, as defined in USA: Secondary | ICD-10-CM | POA: Diagnosis not present

## 2016-05-15 DIAGNOSIS — I638 Other cerebral infarction: Principal | ICD-10-CM | POA: Diagnosis present

## 2016-05-15 DIAGNOSIS — H5316 Psychophysical visual disturbances: Secondary | ICD-10-CM | POA: Diagnosis not present

## 2016-05-15 DIAGNOSIS — R441 Visual hallucinations: Secondary | ICD-10-CM | POA: Diagnosis present

## 2016-05-15 DIAGNOSIS — K219 Gastro-esophageal reflux disease without esophagitis: Secondary | ICD-10-CM | POA: Diagnosis present

## 2016-05-15 DIAGNOSIS — E11319 Type 2 diabetes mellitus with unspecified diabetic retinopathy without macular edema: Secondary | ICD-10-CM | POA: Diagnosis present

## 2016-05-15 DIAGNOSIS — I63342 Cerebral infarction due to thrombosis of left cerebellar artery: Secondary | ICD-10-CM | POA: Diagnosis not present

## 2016-05-15 DIAGNOSIS — I69351 Hemiplegia and hemiparesis following cerebral infarction affecting right dominant side: Secondary | ICD-10-CM | POA: Diagnosis not present

## 2016-05-15 DIAGNOSIS — E785 Hyperlipidemia, unspecified: Secondary | ICD-10-CM | POA: Diagnosis present

## 2016-05-15 DIAGNOSIS — H409 Unspecified glaucoma: Secondary | ICD-10-CM | POA: Diagnosis not present

## 2016-05-15 DIAGNOSIS — E1122 Type 2 diabetes mellitus with diabetic chronic kidney disease: Secondary | ICD-10-CM | POA: Diagnosis present

## 2016-05-15 DIAGNOSIS — E11311 Type 2 diabetes mellitus with unspecified diabetic retinopathy with macular edema: Secondary | ICD-10-CM | POA: Diagnosis not present

## 2016-05-15 DIAGNOSIS — Z888 Allergy status to other drugs, medicaments and biological substances status: Secondary | ICD-10-CM

## 2016-05-15 DIAGNOSIS — E1142 Type 2 diabetes mellitus with diabetic polyneuropathy: Secondary | ICD-10-CM | POA: Diagnosis not present

## 2016-05-15 DIAGNOSIS — Z7982 Long term (current) use of aspirin: Secondary | ICD-10-CM

## 2016-05-15 DIAGNOSIS — E1165 Type 2 diabetes mellitus with hyperglycemia: Secondary | ICD-10-CM | POA: Diagnosis present

## 2016-05-15 DIAGNOSIS — N400 Enlarged prostate without lower urinary tract symptoms: Secondary | ICD-10-CM | POA: Diagnosis present

## 2016-05-15 DIAGNOSIS — E1136 Type 2 diabetes mellitus with diabetic cataract: Secondary | ICD-10-CM | POA: Diagnosis present

## 2016-05-15 DIAGNOSIS — I639 Cerebral infarction, unspecified: Secondary | ICD-10-CM

## 2016-05-15 DIAGNOSIS — Z9049 Acquired absence of other specified parts of digestive tract: Secondary | ICD-10-CM

## 2016-05-15 DIAGNOSIS — Z8673 Personal history of transient ischemic attack (TIA), and cerebral infarction without residual deficits: Secondary | ICD-10-CM

## 2016-05-15 DIAGNOSIS — W19XXXA Unspecified fall, initial encounter: Secondary | ICD-10-CM | POA: Diagnosis not present

## 2016-05-15 DIAGNOSIS — I252 Old myocardial infarction: Secondary | ICD-10-CM

## 2016-05-15 DIAGNOSIS — I25110 Atherosclerotic heart disease of native coronary artery with unstable angina pectoris: Secondary | ICD-10-CM

## 2016-05-15 DIAGNOSIS — I10 Essential (primary) hypertension: Secondary | ICD-10-CM | POA: Diagnosis present

## 2016-05-15 DIAGNOSIS — S0990XA Unspecified injury of head, initial encounter: Secondary | ICD-10-CM | POA: Diagnosis not present

## 2016-05-15 DIAGNOSIS — I69320 Aphasia following cerebral infarction: Secondary | ICD-10-CM | POA: Diagnosis not present

## 2016-05-15 DIAGNOSIS — Z79899 Other long term (current) drug therapy: Secondary | ICD-10-CM

## 2016-05-15 DIAGNOSIS — R296 Repeated falls: Secondary | ICD-10-CM | POA: Diagnosis not present

## 2016-05-15 DIAGNOSIS — S199XXA Unspecified injury of neck, initial encounter: Secondary | ICD-10-CM | POA: Diagnosis not present

## 2016-05-15 DIAGNOSIS — Z8249 Family history of ischemic heart disease and other diseases of the circulatory system: Secondary | ICD-10-CM

## 2016-05-15 DIAGNOSIS — I517 Cardiomegaly: Secondary | ICD-10-CM | POA: Diagnosis not present

## 2016-05-15 LAB — URINALYSIS, ROUTINE W REFLEX MICROSCOPIC
BILIRUBIN URINE: NEGATIVE
Bacteria, UA: NONE SEEN
Ketones, ur: NEGATIVE mg/dL
Leukocytes, UA: NEGATIVE
Nitrite: NEGATIVE
PH: 5 (ref 5.0–8.0)
Protein, ur: 100 mg/dL — AB
Specific Gravity, Urine: 1.025 (ref 1.005–1.030)

## 2016-05-15 LAB — BASIC METABOLIC PANEL
ANION GAP: 6 (ref 5–15)
BUN: 25 mg/dL — ABNORMAL HIGH (ref 6–20)
CALCIUM: 9.4 mg/dL (ref 8.9–10.3)
CHLORIDE: 105 mmol/L (ref 101–111)
CO2: 24 mmol/L (ref 22–32)
Creatinine, Ser: 2.09 mg/dL — ABNORMAL HIGH (ref 0.61–1.24)
GFR calc non Af Amer: 29 mL/min — ABNORMAL LOW (ref >60)
GFR, EST AFRICAN AMERICAN: 34 mL/min — AB (ref >60)
Glucose, Bld: 328 mg/dL — ABNORMAL HIGH (ref 65–99)
POTASSIUM: 4.9 mmol/L (ref 3.5–5.1)
Sodium: 135 mmol/L (ref 135–145)

## 2016-05-15 LAB — CBG MONITORING, ED: Glucose-Capillary: 148 mg/dL — ABNORMAL HIGH (ref 65–99)

## 2016-05-15 LAB — CBC
HEMATOCRIT: 40.3 % (ref 39.0–52.0)
HEMOGLOBIN: 13.4 g/dL (ref 13.0–17.0)
MCH: 27.2 pg (ref 26.0–34.0)
MCHC: 33.3 g/dL (ref 30.0–36.0)
MCV: 81.7 fL (ref 78.0–100.0)
Platelets: 198 10*3/uL (ref 150–400)
RBC: 4.93 MIL/uL (ref 4.22–5.81)
RDW: 13.6 % (ref 11.5–15.5)
WBC: 8.7 10*3/uL (ref 4.0–10.5)

## 2016-05-15 LAB — BRAIN NATRIURETIC PEPTIDE: B Natriuretic Peptide: 108.6 pg/mL — ABNORMAL HIGH (ref 0.0–100.0)

## 2016-05-15 LAB — GLUCOSE, CAPILLARY: GLUCOSE-CAPILLARY: 195 mg/dL — AB (ref 65–99)

## 2016-05-15 LAB — I-STAT TROPONIN, ED: Troponin i, poc: 0 ng/mL (ref 0.00–0.08)

## 2016-05-15 LAB — TROPONIN I: Troponin I: <0.03 ng/mL (ref <0.03)

## 2016-05-15 MED ORDER — PANTOPRAZOLE SODIUM 40 MG PO TBEC
40.0000 mg | DELAYED_RELEASE_TABLET | Freq: Every day | ORAL | Status: DC
  Administered 2016-05-16 – 2016-05-17 (×2): 40 mg via ORAL
  Filled 2016-05-15 (×2): qty 1

## 2016-05-15 MED ORDER — HYDRALAZINE HCL 10 MG PO TABS
10.0000 mg | ORAL_TABLET | Freq: Three times a day (TID) | ORAL | Status: DC
  Administered 2016-05-16 – 2016-05-17 (×6): 10 mg via ORAL
  Filled 2016-05-15 (×6): qty 1

## 2016-05-15 MED ORDER — LINAGLIPTIN 5 MG PO TABS: 5.0000 mg | ORAL_TABLET | Freq: Every day | ORAL | Status: DC

## 2016-05-15 MED ORDER — METOPROLOL SUCCINATE ER 50 MG PO TB24
75.0000 mg | ORAL_TABLET | Freq: Every day | ORAL | Status: DC
  Administered 2016-05-16 – 2016-05-17 (×2): 75 mg via ORAL
  Filled 2016-05-15 (×2): qty 1

## 2016-05-15 MED ORDER — SENNOSIDES-DOCUSATE SODIUM 8.6-50 MG PO TABS: 1.0000 | ORAL_TABLET | Freq: Every evening | ORAL | Status: DC | PRN

## 2016-05-15 MED ORDER — POLYETHYLENE GLYCOL 3350 17 G PO PACK: 17.0000 g | PACK | Freq: Every day | ORAL | Status: DC | PRN

## 2016-05-15 MED ORDER — CETAPHIL MOISTURIZING EX LOTN
TOPICAL_LOTION | CUTANEOUS | Status: DC | PRN
  Filled 2016-05-15: qty 473

## 2016-05-15 MED ORDER — ACETAMINOPHEN 650 MG RE SUPP: 650.0000 mg | RECTAL | Status: DC | PRN

## 2016-05-15 MED ORDER — INSULIN ASPART 100 UNIT/ML ~~LOC~~ SOLN: 0.0000 [IU] | Freq: Every day | SUBCUTANEOUS | Status: DC

## 2016-05-15 MED ORDER — INSULIN ASPART 100 UNIT/ML ~~LOC~~ SOLN
0.0000 [IU] | Freq: Three times a day (TID) | SUBCUTANEOUS | Status: DC
  Administered 2016-05-16: 1 [IU] via SUBCUTANEOUS
  Administered 2016-05-16 – 2016-05-17 (×2): 2 [IU] via SUBCUTANEOUS
  Administered 2016-05-17: 1 [IU] via SUBCUTANEOUS

## 2016-05-15 MED ORDER — ACETAMINOPHEN 160 MG/5ML PO SOLN: 650.0000 mg | ORAL | Status: DC | PRN

## 2016-05-15 MED ORDER — STROKE: EARLY STAGES OF RECOVERY BOOK
Freq: Once | Status: DC
  Filled 2016-05-15: qty 1

## 2016-05-15 MED ORDER — FLUOXETINE HCL 20 MG PO CAPS
20.0000 mg | ORAL_CAPSULE | Freq: Every day | ORAL | Status: DC
  Administered 2016-05-16 – 2016-05-17 (×2): 20 mg via ORAL
  Filled 2016-05-15 (×3): qty 1

## 2016-05-15 MED ORDER — ENOXAPARIN SODIUM 40 MG/0.4ML ~~LOC~~ SOLN
40.0000 mg | Freq: Every day | SUBCUTANEOUS | Status: DC
  Administered 2016-05-16 (×2): 40 mg via SUBCUTANEOUS
  Filled 2016-05-15 (×2): qty 0.4

## 2016-05-15 MED ORDER — PRAVASTATIN SODIUM 20 MG PO TABS
20.0000 mg | ORAL_TABLET | Freq: Every day | ORAL | Status: DC
  Administered 2016-05-16: 20 mg via ORAL
  Filled 2016-05-15: qty 1

## 2016-05-15 MED ORDER — ACETAMINOPHEN 325 MG PO TABS
650.0000 mg | ORAL_TABLET | ORAL | Status: DC | PRN
Start: 2016-05-15 — End: 2016-05-17

## 2016-05-15 MED ORDER — FINASTERIDE 5 MG PO TABS
5.0000 mg | ORAL_TABLET | Freq: Every day | ORAL | Status: DC
  Administered 2016-05-16 – 2016-05-17 (×2): 5 mg via ORAL
  Filled 2016-05-15 (×2): qty 1

## 2016-05-15 MED ORDER — ASPIRIN EC 325 MG PO TBEC
325.0000 mg | DELAYED_RELEASE_TABLET | Freq: Every day | ORAL | Status: DC
  Administered 2016-05-16 – 2016-05-17 (×2): 325 mg via ORAL
  Filled 2016-05-15 (×2): qty 1

## 2016-05-15 MED ORDER — SODIUM CHLORIDE 0.9 % IV SOLN
INTRAVENOUS | Status: DC
  Administered 2016-05-15: 75 mL/h via INTRAVENOUS

## 2016-05-15 MED ORDER — LINACLOTIDE 290 MCG PO CAPS
290.0000 ug | ORAL_CAPSULE | Freq: Every day | ORAL | Status: DC
Start: 2016-05-16 — End: 2016-05-17
  Administered 2016-05-17: 290 ug via ORAL
  Filled 2016-05-15 (×2): qty 1

## 2016-05-15 MED ORDER — INSULIN ASPART 100 UNIT/ML ~~LOC~~ SOLN: 5.0000 [IU] | SUBCUTANEOUS | Status: AC

## 2016-05-15 MED ORDER — LOSARTAN POTASSIUM 50 MG PO TABS
100.0000 mg | ORAL_TABLET | Freq: Every day | ORAL | Status: DC
  Administered 2016-05-16: 100 mg via ORAL
  Filled 2016-05-15: qty 2

## 2016-05-15 NOTE — Telephone Encounter (Signed)
Please advise.   Okay to write?

## 2016-05-15 NOTE — ED Provider Notes (Signed)
Pt singed out to me at shift change. Pt with hx of CVA, rpesented today for worsening dizziness while ambulating, onset last night.  Workup negative so far. MR pending.   7:28 PM MRI showing acute vs subacute CVA in left cerebellum. Discussed with family and patient, all questions answered.  Neuro exam non focal. Pt in NAD. Lungs clear bilaterally. Regular HR and rhythm.  Discussed with Dr. Cheral Marker, advised admission, he will consult.  Spoke with triad, will admit.   Vitals:   05/15/16 1600 05/15/16 2015 05/15/16 2030 05/15/16 2036  BP: (!) 191/96 (!) 181/99 (!) 152/90   Pulse: 72 77 76   Resp: 17 12 16    Temp:    98.4 F (36.9 C)  TempSrc:      SpO2: 100% 100% 100%   Weight:      Height:            Jeannett Senior, PA-C 05/15/16 2046    Fatima Blank, MD 05/16/16 [phone]

## 2016-05-15 NOTE — H&P (Addendum)
History and Physical    [name] PJK:[id number] DOB: July 22, 1940 DOA: 05/15/2016  Referring MD/NP/PA: Leatha Gilding, PA-C PCP: Walker Kehr, MD  Patient coming from: Home  Chief Complaint: off balance   HPI: Caleb Handel Sr. is a 76 y.o. male with medical history significant of HTN, DM type 2, CAD s/p CABG, CVA in 02/2016, and legally blind; who presents with complaints of feeling off balance and not quite himself. Patient at baseline lives alone, but son has been living in the household following his previous stroke in February. He has to work and is not in the home 24/7 and multiple doctors have recommended that the patient needs 24-hour care. However, they have had issues with the patient's insurance who states that he does not need 24-hour nursing care. Patient's daughter is present at bedside and helps provide additional history. For the last 2 days patient has not been his normal self he reportedly fell at home in the bathroom and his son had to help him up. Son noted that the patient likely had multiple falls although the patient denies this. He has been more confused normal as the daughter states that he called her on the phone and was talking to her and keep stating that he needed to call her. Associated symptoms include  intermittent chest pain(last episode 2 days ago lasting 2-3 minutes and self resolving), right-sided groin pain, and reports of  seeing things such as children that he walks through. The patient is legally blind and previously had issues with see things, but family states that they only for a few months following his previous stroke. Patient reports taking his diabetes medications as prescribed although currently not on insulin.  ED Course:  Upon admission into the emergency department patient was seen to be afebrile with blood pressures elevated up to 191/96, and all other vital signs relatively within normal limits. Labs revealed Cr 2.069, BUN 25,  creatinine 328. MRI of the brain reveals signs of acute/subacute stroke of the left lateral cerebellar hemisphere. Neurology was consulted and will see the patient.  Review of Systems: As per HPI otherwise 10 point review of systems negative.   Past Medical History:  Diagnosis Date  . Blindness   . CAD (coronary artery disease)   . Cholelithiasis   . CTS (carpal tunnel syndrome)    Left  . DM type 2 with diabetic peripheral neuropathy ([place])   . ED (erectile dysfunction)   . Elevated PSA   . GERD (gastroesophageal reflux disease)   . Glaucoma   . HTN (hypertension)   . 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 MI (myocardial infarction) ([place]) 1991  . Thyroid nodule   . Type II or unspecified type diabetes mellitus without mention of complication, not stated as uncontrolled     Past Surgical History:  Procedure Laterality Date  . CHOLECYSTECTOMY       reports that he has quit smoking. He has never used smokeless tobacco. He reports that he does not drink alcohol or use drugs.  Allergies  Allergen Reactions  . Hydroxyzine Other (See Comments)    Visual hallucinations  . Verapamil Other (See Comments)    Hallucinations   . Benazepril Hcl Cough    Family History  Problem Relation Age of Onset  . Hypertension Mother   . Kidney disease Father   . Hypertension Father   . Coronary artery disease      1st degree Male relative <60  . Diabetes  1st degree relative    Prior to Admission medications   Medication Sig Start Date End Date Taking? Authorizing Provider  aspirin EC 325 MG EC tablet Take 1 tablet (325 mg total) by mouth daily. 03/29/16   Lavon Paganini Angiulli, PA-C  cholecalciferol (VITAMIN D) 1000 units tablet Take 1 tablet (1,000 Units total) by mouth daily. 03/29/16   Lavon Paganini Angiulli, PA-C  Emollient (EUCERIN) lotion Apply topically as needed for dry skin. Use bid 01/03/16   Aleksei Plotnikov V, MD  finasteride (PROSCAR) 5 MG tablet Take 1 tablet (5 mg  total) by mouth daily. For Prostate 03/29/16   Lavon Paganini Angiulli, PA-C  FLUoxetine (PROZAC) 20 MG tablet Take 1 tablet (20 mg total) by mouth daily. 03/29/16   Lavon Paganini Angiulli, PA-C  hydrALAZINE (APRESOLINE) 10 MG tablet Take 1 tablet (10 mg total) by mouth every 8 (eight) hours. 03/29/16   Lavon Paganini Angiulli, PA-C  linaclotide (LINZESS) 290 MCG CAPS capsule Take 1 capsule (290 mcg total) by mouth daily. 03/29/16   Lavon Paganini Angiulli, PA-C  linagliptin (TRADJENTA) 5 MG TABS tablet Take 1 tablet (5 mg total) by mouth daily. 03/29/16   Lavon Paganini Angiulli, PA-C  losartan (COZAAR) 100 MG tablet Take 1 tablet (100 mg total) by mouth at bedtime. 03/29/16   Lavon Paganini Angiulli, PA-C  lovastatin (MEVACOR) 20 MG tablet Take 1 tablet (20 mg total) by mouth daily. For Cholesterol 03/29/16   Lavon Paganini Angiulli, PA-C  metoprolol succinate (TOPROL-XL) 25 MG 24 hr tablet Take 3 tablets (75 mg total) by mouth daily. Take with or immediately following a meal. 03/29/16   Lavon Paganini Angiulli, PA-C  pantoprazole (PROTONIX) 40 MG tablet Take 1 tablet (40 mg total) by mouth daily. 03/29/16   Lavon Paganini Angiulli, PA-C  polyethylene glycol (MIRALAX / GLYCOLAX) packet Take 17 g by mouth daily. Patient taking differently: Take 17 g by mouth daily as needed for moderate constipation.  11/01/15   Flossie Buffy, NP    Physical Exam: Constitutional: NAD, calm, comfortable Vitals:   05/15/16 1034 05/15/16 1239 05/15/16 1600  BP: (!) 142/68 (!) 150/65 (!) 191/96  Pulse: 75 68 72  Resp: 16 16 17   Temp: 98.3 F (36.8 C) 98.4 F (36.9 C)   TempSrc: Oral Oral   SpO2: 100% 100% 100%  Weight: 95.3 kg (210 lb)    Height: 5\' 6"  (1.676 m)     Eyes: blind in both eyes ENMT: Mucous membranes are moist. Posterior pharynx clear of any exudate or lesions.  Neck: normal, supple, no masses, no thyromegaly Respiratory: clear to auscultation bilaterally, no wheezing, no crackles. Normal respiratory effort. No accessory muscle use.  Cardiovascular:   Distant heart sounds, but regular rate and rhythm, no murmurs / rubs / gallops. No extremity edema. 2+ pedal pulses. No carotid bruits.  Abdomen: no tenderness, no masses palpated. No hepatosplenomegaly. Bowel sounds positive.  Musculoskeletal: no clubbing / cyanosis. No joint deformity upper and lower extremities. Good ROM, no contractures. Normal muscle tone.  Skin: no rashes, lesions, ulcers. No induration Neurologic: CN 2-12 grossly intact. Sensation abnormal, DTR normal. Strength 5/5 in all 4.  Resting tremor noted on the left upper extremity. Did not ambulate patient. Psychiatric: Normal judgment and insight. Alert and oriented x 3. Normal mood.     Labs on Admission: I have personally reviewed following labs and imaging studies  CBC:  Recent Labs Lab 05/15/16 1114  WBC 8.7  HGB 13.4  HCT 40.3  MCV 81.7  PLT 659   Basic Metabolic Panel:  Recent Labs Lab 05/15/16 1114  NA 135  K 4.9  CL 105  CO2 24  GLUCOSE 328*  BUN 25*  CREATININE 2.09*  CALCIUM 9.4   GFR: Estimated Creatinine Clearance: 32.5 mL/min (A) (by C-G formula based on SCr of 2.09 mg/dL (H)). Liver Function Tests: No results for input(s): AST, ALT, ALKPHOS, BILITOT, PROT, ALBUMIN in the last 168 hours. No results for input(s): LIPASE, AMYLASE in the last 168 hours. No results for input(s): AMMONIA in the last 168 hours. Coagulation Profile: No results for input(s): INR, PROTIME in the last 168 hours. Cardiac Enzymes: No results for input(s): CKTOTAL, CKMB, CKMBINDEX, TROPONINI in the last 168 hours. BNP (last 3 results) No results for input(s): PROBNP in the last 8760 hours. HbA1C: No results for input(s): HGBA1C in the last 72 hours. CBG: No results for input(s): GLUCAP in the last 168 hours. Lipid Profile: No results for input(s): CHOL, HDL, LDLCALC, TRIG, CHOLHDL, LDLDIRECT in the last 72 hours. Thyroid Function Tests: No results for input(s): TSH, T4TOTAL, FREET4, T3FREE, THYROIDAB in the  last 72 hours. Anemia Panel: No results for input(s): VITAMINB12, FOLATE, FERRITIN, TIBC, IRON, RETICCTPCT in the last 72 hours. Urine analysis:    Component Value Date/Time   COLORURINE YELLOW 05/15/2016 1631   APPEARANCEUR HAZY (A) 05/15/2016 1631   LABSPEC 1.025 05/15/2016 1631   PHURINE 5.0 05/15/2016 1631   GLUCOSEU >=500 (A) 05/15/2016 1631   GLUCOSEU NEGATIVE 11/01/2015 1709   HGBUR SMALL (A) 05/15/2016 1631   BILIRUBINUR NEGATIVE 05/15/2016 1631   KETONESUR NEGATIVE 05/15/2016 1631   PROTEINUR 100 (A) 05/15/2016 1631   UROBILINOGEN 0.2 11/01/2015 1709   NITRITE NEGATIVE 05/15/2016 1631   LEUKOCYTESUR NEGATIVE 05/15/2016 1631   Sepsis Labs: No results found for this or any previous visit (from the past 240 hour(s)).   Radiological Exams on Admission: Dg Chest 2 View  Result Date: 05/15/2016 CLINICAL DATA:  Gait imbalance beginning last night. EXAM: CHEST  2 VIEW COMPARISON:  Chest radiograph March 21, 2016 FINDINGS: Cardiac silhouette is upper limits of normal in size, status post median sternotomy for CABG. No pleural effusion or focal consolidation. No pneumothorax. Soft tissue planes and included osseous structures are nonsuspicious. IMPRESSION: Stable examination: Borderline cardiomegaly, no acute pulmonary process. Electronically Signed   By: Elon Alas M.D.   On: 05/15/2016 14:58   Ct Head Wo Contrast  Result Date: 05/15/2016 CLINICAL DATA:  Gait imbalance beginning last night. Unwitnessed fall 2 days ago. History of diabetes, hypertension and stroke. EXAM: CT HEAD WITHOUT CONTRAST CT CERVICAL SPINE WITHOUT CONTRAST TECHNIQUE: Multidetector CT imaging of the head and cervical spine was performed following the standard protocol without intravenous contrast. Multiplanar CT image reconstructions of the cervical spine were also generated. COMPARISON:  MRI of the head March 13, 2016 FINDINGS: CT HEAD FINDINGS BRAIN: No intraparenchymal hemorrhage, mass effect nor  midline shift. Old LEFT basal ganglia lacunar infarct with mild ex vacuo dilatation of the frontal horn of the lateral ventricles. Moderate to severe ventriculomegaly on the basis of global parenchymal brain volume loss. Old LEFT thalamus lacunar infarct. Small old LEFT cerebellar infarcts. Patchy supratentorial white matter hypodensities within normal range for patient's age, though non-specific are most compatible with chronic small vessel ischemic disease. No acute large vascular territory infarcts. No abnormal extra-axial fluid collections. Basal cisterns are patent. VASCULAR: Moderate calcific atherosclerosis of the carotid siphons. SKULL: No skull fracture. No significant scalp soft tissue swelling. Small nodule  RIGHT frontal scalp, recommend direct inspection. SINUSES/ORBITS: The mastoid air-cells and included paranasal sinuses are well-aerated. LEFT frontal osteoma. The included ocular globes and orbital contents are non-suspicious. Status post bilateral ocular lens implants with RIGHT glaucoma drainage device. OTHER: None. CT CERVICAL SPINE FINDINGS ALIGNMENT: Straightened lordosis. Vertebral bodies in alignment. SKULL BASE AND VERTEBRAE: Cervical vertebral bodies and posterior elements are intact. Moderate to severe C4-5 and C5-6 disc height loss with endplate spurring and uncovertebral hypertrophy compatible with degenerative discs. C1-2 articulation maintained with moderate arthropathy. Borderline congenital canal narrowing on the basis of foreshortened pedicles. SOFT TISSUES AND SPINAL CANAL: Nonacute. Moderate calcific atherosclerosis carotid bifurcations. DISC LEVELS: Moderate RIGHT C3-4, moderate to severe C4-5, moderate C5-6 neural foraminal narrowing. UPPER CHEST: Lung apices are clear. OTHER: None. IMPRESSION: CT HEAD: No acute intracranial process. Stable examination including moderate to severe global parenchymal brain volume loss and multiple old small infarcts. CT CERVICAL SPINE: No acute  fracture or malalignment. Moderate to severe C4-5 neural foraminal narrowing. Electronically Signed   By: Elon Alas M.D.   On: 05/15/2016 15:07   Ct Cervical Spine Wo Contrast  Result Date: 05/15/2016 CLINICAL DATA:  Gait imbalance beginning last night. Unwitnessed fall 2 days ago. History of diabetes, hypertension and stroke. EXAM: CT HEAD WITHOUT CONTRAST CT CERVICAL SPINE WITHOUT CONTRAST TECHNIQUE: Multidetector CT imaging of the head and cervical spine was performed following the standard protocol without intravenous contrast. Multiplanar CT image reconstructions of the cervical spine were also generated. COMPARISON:  MRI of the head March 13, 2016 FINDINGS: CT HEAD FINDINGS BRAIN: No intraparenchymal hemorrhage, mass effect nor midline shift. Old LEFT basal ganglia lacunar infarct with mild ex vacuo dilatation of the frontal horn of the lateral ventricles. Moderate to severe ventriculomegaly on the basis of global parenchymal brain volume loss. Old LEFT thalamus lacunar infarct. Small old LEFT cerebellar infarcts. Patchy supratentorial white matter hypodensities within normal range for patient's age, though non-specific are most compatible with chronic small vessel ischemic disease. No acute large vascular territory infarcts. No abnormal extra-axial fluid collections. Basal cisterns are patent. VASCULAR: Moderate calcific atherosclerosis of the carotid siphons. SKULL: No skull fracture. No significant scalp soft tissue swelling. Small nodule RIGHT frontal scalp, recommend direct inspection. SINUSES/ORBITS: The mastoid air-cells and included paranasal sinuses are well-aerated. LEFT frontal osteoma. The included ocular globes and orbital contents are non-suspicious. Status post bilateral ocular lens implants with RIGHT glaucoma drainage device. OTHER: None. CT CERVICAL SPINE FINDINGS ALIGNMENT: Straightened lordosis. Vertebral bodies in alignment. SKULL BASE AND VERTEBRAE: Cervical vertebral  bodies and posterior elements are intact. Moderate to severe C4-5 and C5-6 disc height loss with endplate spurring and uncovertebral hypertrophy compatible with degenerative discs. C1-2 articulation maintained with moderate arthropathy. Borderline congenital canal narrowing on the basis of foreshortened pedicles. SOFT TISSUES AND SPINAL CANAL: Nonacute. Moderate calcific atherosclerosis carotid bifurcations. DISC LEVELS: Moderate RIGHT C3-4, moderate to severe C4-5, moderate C5-6 neural foraminal narrowing. UPPER CHEST: Lung apices are clear. OTHER: None. IMPRESSION: CT HEAD: No acute intracranial process. Stable examination including moderate to severe global parenchymal brain volume loss and multiple old small infarcts. CT CERVICAL SPINE: No acute fracture or malalignment. Moderate to severe C4-5 neural foraminal narrowing. Electronically Signed   By: Elon Alas M.D.   On: 05/15/2016 15:07   Mr Brain Wo Contrast  Result Date: 05/15/2016 CLINICAL DATA:  Unwitnessed fall in new subjective balance deficits. EXAM: MRI HEAD WITHOUT CONTRAST TECHNIQUE: Multiplanar, multiecho pulse sequences of the brain and surrounding structures were obtained without  intravenous contrast. COMPARISON:  03/13/2016 MRI of the head. FINDINGS: Brain: Subcentimeter focus of diffusion restriction within the lateral left cerebellar hemisphere compatible with acute/ early subacute infarction. No additional diffusion signal abnormality of the brain. Stable foci of T2 FLAIR hyperintense signal abnormality within subcortical and periventricular white matter as well as the pons compatible with chronic microvascular ischemic changes. Stable brain parenchymal volume loss. Stable small chronic lacunar infarctions within the left thalamus and left cerebellar hemisphere. No new susceptibility hypointensity to indicate intracranial hemorrhage. Ventricle size is commensurate to the degree of volume loss. No focal mass effect. No extra-axial  collection. No effacement of basilar cisterns. Vascular: Normal flow voids. Skull and upper cervical spine: Normal marrow signal. Sinuses/Orbits: Negative. Other: None. IMPRESSION: 1. Subcentimeter acute/ early subacute infarction within the left lateral cerebellar hemisphere. No acute hemorrhage identified. 2. Stable chronic microvascular ischemic changes and parenchymal volume loss of the brain. These results were called by telephone at the time of interpretation on 05/15/2016 at 6:38 pm to Arnold, who verbally acknowledged these results. Electronically Signed   By: Kristine Garbe M.D.   On: 05/15/2016 18:41    EKG: Independently reviewed. Sinus rhythm  Assessment/Plan CVA: Acute/subacute: Patient presents with feelings of off balance, recent falls, and confusion. MRI of the brain shows infarct of the left lateral cerebellar hemisphere without acute hemorrhage. Risk factor modification - Admit to telemetry bed - Stroke order set initiated - Neuro checks - PT/OT to eval and treat - Check Hemoglobin A1c and lipid panel in a.m. - ASA - Social work consult patient likely in need of 24-hour care  - [place] neurology consultative services, will follow-up for need of repeat echo considering recent CVA 2 months ago.  [place] Needs syndrome: Patient with reports of confusion and visual hallucinations. Initial workup shows no acute signs of infection to cause symptoms. Could be also related to the above.  - continue to monitor  Falls - Physical therapy to evaluate  Diabetes mellitus type 2, uncontrolled: Patient's last hemoglobin A1c was noted to be 7.5 on 3/22. He presents with elevated blood glucose of 328 on admission. - Hypoglycemic protocols - Check hemoglobin A1c  - Held Trajenda - CBGs with SSI  Chronic kidney disease stage III: Stable. - Continue to monitor  Essential hypertension - Continue metoprolol and hydralazine  CAD s/p CABG - continue  ASA  Hyperlipidemia - Continue pharmacy substitution of pravastatin for lovastatin. Patient may benefit from being changed to a a high dose statin  BPH  - Continue finasteride   GERD - Continue Protonix  Family Communicdiscuss plan of care with the patient and family present at bedside Disposition Plan: TBD Consults called: Neurology Admission status: Inpatient  Norval Morton MD [facility] Pager [phone]  If 7PM-7AM, please contact night-coverage [email or web address] Password [facility]  05/15/2016, 7:38 PM    \

## 2016-05-15 NOTE — ED Notes (Signed)
Patient transported to CT 

## 2016-05-15 NOTE — ED Notes (Signed)
Pt's family member requesting to speak with charge RN charge RN aware. Pt informed of delay and that can be moved to next available room.

## 2016-05-15 NOTE — ED Notes (Signed)
Pt aware that urine sample is needed, but is unable to provide one at this time 

## 2016-05-15 NOTE — ED Provider Notes (Signed)
[facility] DEPT Provider Note   CSN: [id number] Arrival date & time: 05/15/16  1022     History   Chief Complaint Chief Complaint  Patient presents with  . Weakness    HPI Caleb Mckercher Sr. is a 76 y.o. 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  Patient notes that his symptoms are similar to the deficits he has had recently since CVA, but notes today's symptoms are higher in intensity and last longer also now left side feels weak instead of the right.  Patient reports two recent, unwitnessed, falls at home last fall was 2 days ago.  Patient unsure if he struck his head, but denies LOC. He denies CP, SOB, palpitations prior to falls.  Patient is on daily aspirin. Patient reports "short episode of chest pain" 3 days ago while at rest that lasted 2-3 minutes associated with nausea, but no SOB, dizziness, palpitations.    HPI  Past Medical History:  Diagnosis Date  . Blindness   . CAD (coronary artery disease)   . Cholelithiasis   . CTS (carpal tunnel syndrome)    Left  . DM type 2 with diabetic peripheral neuropathy ([place])   . ED (erectile dysfunction)   . Elevated PSA   . GERD (gastroesophageal reflux disease)   . Glaucoma   . HTN (hypertension)   . 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 MI (myocardial infarction) ([place]) 1991  . Thyroid nodule   . Type II or unspecified type diabetes mellitus without mention of complication, not stated as uncontrolled     Patient Active Problem List   Diagnosis Date Noted  . AKI (acute kidney injury) ([place])   . Type 2 diabetes mellitus with peripheral neuropathy ([facility])   . Labile blood pressure   . Stage 3 chronic  kidney disease   . Essential hypertension   . Blind   . 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 Acute cerebrovascular accident (CVA) ([place]) 03/13/2016  . Pruritus 01/03/2016  . UTI (urinary tract infection) 10/27/2015  . Impacted cerumen of left ear 10/03/2015  . Sore throat in the morning 09/23/2015  . Situational mixed anxiety and depressive disorder 06/17/2015  . Neuropathic pain 03/09/2015  . Insomnia 03/09/2015  . Left arm weakness 08/13/2014  . Herpes zoster 07/13/2014  . Constipation 07/13/2014  . Epididymitis 02/23/2014  . Pain in joint, ankle and foot 08/04/2013  . Dry mouth 05/05/2013  . 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 Dizziness 11/01/2011  . Obesity (BMI 30-39.9)   . Chronic kidney disease (CKD), stage IV (severe) ([place])   . Elevated PSA 08/17/2011  . Bladder neck obstruction 04/13/2011  . DM (diabetes mellitus), type 2 with ophthalmic complications ([place])   . Glaucoma associated with ocular disorder   . Carpal tunnel sundrome   . Erectile dysfunction   . GERD   . Cervical disc disorder with radiculopathy   . Hyperlipidemia   . Cholelithiases   . Hypertensive heart disease   . Coronary atherosclerosis     Past Surgical History:  Procedure Laterality Date  . CHOLECYSTECTOMY         Home Medications    Prior to Admission medications   Medication Sig Start Date End  Date Taking? Authorizing Provider  aspirin EC 325 MG EC tablet Take 1 tablet (325 mg total) by mouth daily. 03/29/16   Lavon Paganini Angiulli, PA-C  cholecalciferol (VITAMIN D) 1000 units tablet Take 1 tablet (1,000 Units total) by mouth daily. 03/29/16   Lavon Paganini Angiulli, PA-C  Emollient (EUCERIN) lotion Apply topically as needed for dry skin. Use bid 01/03/16   Aleksei Plotnikov V, MD  finasteride (PROSCAR) 5 MG tablet Take 1 tablet (5 mg total) by mouth daily. For Prostate 03/29/16   Lavon Paganini Angiulli, PA-C  FLUoxetine (PROZAC) 20 MG tablet Take 1 tablet (20 mg total) by mouth daily. 03/29/16    Lavon Paganini Angiulli, PA-C  hydrALAZINE (APRESOLINE) 10 MG tablet Take 1 tablet (10 mg total) by mouth every 8 (eight) hours. 03/29/16   Lavon Paganini Angiulli, PA-C  linaclotide (LINZESS) 290 MCG CAPS capsule Take 1 capsule (290 mcg total) by mouth daily. 03/29/16   Lavon Paganini Angiulli, PA-C  linagliptin (TRADJENTA) 5 MG TABS tablet Take 1 tablet (5 mg total) by mouth daily. 03/29/16   Lavon Paganini Angiulli, PA-C  losartan (COZAAR) 100 MG tablet Take 1 tablet (100 mg total) by mouth at bedtime. 03/29/16   Lavon Paganini Angiulli, PA-C  lovastatin (MEVACOR) 20 MG tablet Take 1 tablet (20 mg total) by mouth daily. For Cholesterol 03/29/16   Lavon Paganini Angiulli, PA-C  metoprolol succinate (TOPROL-XL) 25 MG 24 hr tablet Take 3 tablets (75 mg total) by mouth daily. Take with or immediately following a meal. 03/29/16   Lavon Paganini Angiulli, PA-C  pantoprazole (PROTONIX) 40 MG tablet Take 1 tablet (40 mg total) by mouth daily. 03/29/16   Lavon Paganini Angiulli, PA-C  polyethylene glycol (MIRALAX / GLYCOLAX) packet Take 17 g by mouth daily. Patient taking differently: Take 17 g by mouth daily as needed for moderate constipation.  11/01/15   Flossie Buffy, NP    Family History Family History  Problem Relation Age of Onset  . Hypertension Mother   . Kidney disease Father   . Hypertension Father   . Coronary artery disease      1st degree Male relative <60  . Diabetes      1st degree relative    Social History Social History  Substance Use Topics  . Smoking status: Former Research scientist (life sciences)  . Smokeless tobacco: Never Used  . Alcohol use No     Allergies   Hydroxyzine; Verapamil; and Benazepril hcl   Review of Systems Review of Systems  Constitutional: Negative for chills and fever.  HENT: Negative for congestion and trouble swallowing.   Eyes: Positive for visual disturbance (blindness, unchanged).  Respiratory: Negative for cough and shortness of breath.   Cardiovascular: Positive for chest pain. Negative for palpitations.    Gastrointestinal: Positive for nausea. Negative for abdominal pain, blood in stool, constipation, diarrhea and vomiting.  Genitourinary: Negative for difficulty urinating, dysuria and flank pain.  Musculoskeletal: Positive for gait problem (balance issues). Negative for myalgias.  Skin: Negative for rash.  Neurological: Positive for weakness (left sided) and light-headedness. Negative for dizziness, tremors, syncope, speech difficulty, numbness and headaches.  Hematological: Negative.   Psychiatric/Behavioral: Negative.      Physical Exam Updated Vital Signs BP (!) 150/65 (BP Location: Right Arm)   Pulse 68   Temp 98.4 F (36.9 C) (Oral)   Resp 16   Ht _0  (1.676 m)   Wt 95.3 kg   SpO2 100%   BMI 33.89 kg/m   Physical Exam  Constitutional:  He appears well-developed and well-nourished. No distress.  Non toxic appearing  HENT:  Head: Normocephalic and atraumatic.  Nose: Nose normal.  Moist mucous membranes Normal oropharynx and tonsils TMs normal  Eyes: Right eye exhibits abnormal extraocular motion. Left eye exhibits abnormal extraocular motion. Right pupil is not reactive. Left pupil is not reactive.  Non reactive, fixed pupils bilaterally Abnormal EOMs  (blindness)  No scleral or conjunctival injection  Neck: Normal range of motion. Neck supple. No JVD present.  Cardiovascular: Normal rate, regular rhythm, normal heart sounds and intact distal pulses.   No murmur heard. Pulmonary/Chest: Effort normal and breath sounds normal. No respiratory distress. He has no wheezes. He has no rales.  Abdominal: Soft. Bowel sounds are normal. There is no tenderness.  Musculoskeletal: Normal range of motion. He exhibits no deformity.  Lymphadenopathy:    He has no cervical adenopathy.  Neurological:  Pt is alert and oriented.   Speech and phonation normal.   Thought process coherent.   Strength 5/5 in upper and lower extremities.   Sensation to light touch intact in upper and  lower extremities.  Gait normal.   Negative Romberg. No leg drift.   Unable to perform finger to nose test, chronic blindness CN I not tested CN II visual fields not tested CN III, IV, VI PEERL and EOM abnormal CN V light touch intact in all 3 divisions of trigeminal nerve CN VII facial nerve movements intact, symmetric CN VIII hearing intact to finger rub CN IX, X no uvula deviation, symmetric soft palate rise CN XI 5/5 SCM and trapezius strength  CN XII Tongue midline with symmetric L/R movement   Skin: Skin is warm and dry. Capillary refill takes less than 2 seconds.  Psychiatric: He has a normal mood and affect. His behavior is normal. Judgment and thought content normal.  Nursing note and vitals reviewed.    ED Treatments / Results  Labs (all labs ordered are listed, but only abnormal results are displayed) Labs Reviewed  BASIC METABOLIC PANEL - Abnormal; Notable for the following:       Result Value   Glucose, Bld 328 (*)    BUN 25 (*)    Creatinine, Ser 2.09 (*)    GFR calc non Af Amer 29 (*)    GFR calc Af Amer 34 (*)    All other components within normal limits  URINALYSIS, ROUTINE W REFLEX MICROSCOPIC - Abnormal; Notable for the following:    APPearance HAZY (*)    Glucose, UA >=500 (*)    Hgb urine dipstick SMALL (*)    Protein, ur 100 (*)    Squamous Epithelial / LPF 0-5 (*)    All other components within normal limits  CBC  BRAIN NATRIURETIC PEPTIDE  I-STAT TROPOININ, ED  CBG MONITORING, ED    EKG  EKG Interpretation None       Radiology Dg Chest 2 View  Result Date: 05/15/2016 CLINICAL DATA:  Gait imbalance beginning last night. EXAM: CHEST  2 VIEW COMPARISON:  Chest radiograph March 21, 2016 FINDINGS: Cardiac silhouette is upper limits of normal in size, status post median sternotomy for CABG. No pleural effusion or focal consolidation. No pneumothorax. Soft tissue planes and included osseous structures are nonsuspicious. IMPRESSION: Stable  examination: Borderline cardiomegaly, no acute pulmonary process. Electronically Signed   By: Elon Alas M.D.   On: 05/15/2016 14:58   Ct Head Wo Contrast  Result Date: 05/15/2016 CLINICAL DATA:  Gait imbalance beginning last night. Unwitnessed fall  2 days ago. History of diabetes, hypertension and stroke. EXAM: CT HEAD WITHOUT CONTRAST CT CERVICAL SPINE WITHOUT CONTRAST TECHNIQUE: Multidetector CT imaging of the head and cervical spine was performed following the standard protocol without intravenous contrast. Multiplanar CT image reconstructions of the cervical spine were also generated. COMPARISON:  MRI of the head March 13, 2016 FINDINGS: CT HEAD FINDINGS BRAIN: No intraparenchymal hemorrhage, mass effect nor midline shift. Old LEFT basal ganglia lacunar infarct with mild ex vacuo dilatation of the frontal horn of the lateral ventricles. Moderate to severe ventriculomegaly on the basis of global parenchymal brain volume loss. Old LEFT thalamus lacunar infarct. Small old LEFT cerebellar infarcts. Patchy supratentorial white matter hypodensities within normal range for patient's age, though non-specific are most compatible with chronic small vessel ischemic disease. No acute large vascular territory infarcts. No abnormal extra-axial fluid collections. Basal cisterns are patent. VASCULAR: Moderate calcific atherosclerosis of the carotid siphons. SKULL: No skull fracture. No significant scalp soft tissue swelling. Small nodule RIGHT frontal scalp, recommend direct inspection. SINUSES/ORBITS: The mastoid air-cells and included paranasal sinuses are well-aerated. LEFT frontal osteoma. The included ocular globes and orbital contents are non-suspicious. Status post bilateral ocular lens implants with RIGHT glaucoma drainage device. OTHER: None. CT CERVICAL SPINE FINDINGS ALIGNMENT: Straightened lordosis. Vertebral bodies in alignment. SKULL BASE AND VERTEBRAE: Cervical vertebral bodies and posterior  elements are intact. Moderate to severe C4-5 and C5-6 disc height loss with endplate spurring and uncovertebral hypertrophy compatible with degenerative discs. C1-2 articulation maintained with moderate arthropathy. Borderline congenital canal narrowing on the basis of foreshortened pedicles. SOFT TISSUES AND SPINAL CANAL: Nonacute. Moderate calcific atherosclerosis carotid bifurcations. DISC LEVELS: Moderate RIGHT C3-4, moderate to severe C4-5, moderate C5-6 neural foraminal narrowing. UPPER CHEST: Lung apices are clear. OTHER: None. IMPRESSION: CT HEAD: No acute intracranial process. Stable examination including moderate to severe global parenchymal brain volume loss and multiple old small infarcts. CT CERVICAL SPINE: No acute fracture or malalignment. Moderate to severe C4-5 neural foraminal narrowing. Electronically Signed   By: Elon Alas M.D.   On: 05/15/2016 15:07   Ct Cervical Spine Wo Contrast  Result Date: 05/15/2016 CLINICAL DATA:  Gait imbalance beginning last night. Unwitnessed fall 2 days ago. History of diabetes, hypertension and stroke. EXAM: CT HEAD WITHOUT CONTRAST CT CERVICAL SPINE WITHOUT CONTRAST TECHNIQUE: Multidetector CT imaging of the head and cervical spine was performed following the standard protocol without intravenous contrast. Multiplanar CT image reconstructions of the cervical spine were also generated. COMPARISON:  MRI of the head March 13, 2016 FINDINGS: CT HEAD FINDINGS BRAIN: No intraparenchymal hemorrhage, mass effect nor midline shift. Old LEFT basal ganglia lacunar infarct with mild ex vacuo dilatation of the frontal horn of the lateral ventricles. Moderate to severe ventriculomegaly on the basis of global parenchymal brain volume loss. Old LEFT thalamus lacunar infarct. Small old LEFT cerebellar infarcts. Patchy supratentorial white matter hypodensities within normal range for patient's age, though non-specific are most compatible with chronic small vessel  ischemic disease. No acute large vascular territory infarcts. No abnormal extra-axial fluid collections. Basal cisterns are patent. VASCULAR: Moderate calcific atherosclerosis of the carotid siphons. SKULL: No skull fracture. No significant scalp soft tissue swelling. Small nodule RIGHT frontal scalp, recommend direct inspection. SINUSES/ORBITS: The mastoid air-cells and included paranasal sinuses are well-aerated. LEFT frontal osteoma. The included ocular globes and orbital contents are non-suspicious. Status post bilateral ocular lens implants with RIGHT glaucoma drainage device. OTHER: None. CT CERVICAL SPINE FINDINGS ALIGNMENT: Straightened lordosis. Vertebral bodies in alignment. SKULL  BASE AND VERTEBRAE: Cervical vertebral bodies and posterior elements are intact. Moderate to severe C4-5 and C5-6 disc height loss with endplate spurring and uncovertebral hypertrophy compatible with degenerative discs. C1-2 articulation maintained with moderate arthropathy. Borderline congenital canal narrowing on the basis of foreshortened pedicles. SOFT TISSUES AND SPINAL CANAL: Nonacute. Moderate calcific atherosclerosis carotid bifurcations. DISC LEVELS: Moderate RIGHT C3-4, moderate to severe C4-5, moderate C5-6 neural foraminal narrowing. UPPER CHEST: Lung apices are clear. OTHER: None. IMPRESSION: CT HEAD: No acute intracranial process. Stable examination including moderate to severe global parenchymal brain volume loss and multiple old small infarcts. CT CERVICAL SPINE: No acute fracture or malalignment. Moderate to severe C4-5 neural foraminal narrowing. Electronically Signed   By: Elon Alas M.D.   On: 05/15/2016 15:07    Procedures Procedures (including critical care time)  Medications Ordered in ED Medications - No data to display   Initial Impression / Assessment and Plan / ED Course  I have reviewed the triage vital signs and the nursing notes.  Pertinent labs & imaging results that were  available during my care of the patient were reviewed by me and considered in my medical decision making (see chart for details).  Clinical Course as of May 15 1700  Tue May 15, 2016  1513 Temp: 98.3 F (36.8 C) [CG]  1513 Pulse Rate: 75 [CG]  1513 BP: (!) 142/68 [CG]  1513 Resp: 16 [CG]  1513 SpO2: 100 % [CG]  1513 Nonischemic  ED EKG [CG]  1514 IMPRESSION: Stable examination: Borderline cardiomegaly, no acute pulmonary process. DG Chest 2 View [CG]  6834 IMPRESSION: CT HEAD: No acute intracranial process.  Stable examination including moderate to severe global parenchymal brain volume loss and multiple old small infarcts.  CT CERVICAL SPINE: No acute fracture or malalignment.  Moderate to severe C4-5 neural foraminal narrowing. CT HEAD WO CONTRAST [CG]  1515 WBC: 8.7 [CG]  1515 Hemoglobin: 13.4 [CG]  1515 Sodium: 135 [CG]  1515 Potassium: 4.9 [CG]  1515 Chloride: 105 [CG]  1515 CO2: 24 [CG]  1515 Creatinine: (!) 2.09 [CG]  1515 Glucose: (!) 328 [CG]  1515 EGFR (African American): (!) 34 [CG]    Clinical Course User Index [CG] Kinnie Feil, PA-C   76 yo male with h/o T2DM (on Tradjenta) with diabetic peripheral neuropathy and retinopathy, CAD, blindness, CKD, recent acute CVA (parietal infarct with SAH, 03/13/26) with right hemiparesis and balance deficits presents to ED for subjective worsening balance issues and left sided "weakness" that started at midnight today.  Patient reports 2 unwitnessed mechanical falls since CVA, last fall was 2 days ago, patient denied LOC but unsure if he hit his head.  Patient lives by himself is completely blind and ambulates with a cane.  Patient's daughter notes patient does not use his cane as much as he should.  On exam VS are wnl.  Patient is alert and oriented to self, place and time.  Other than non reactive, fixed pupils and abnormal EOMs (h/o blindness) neurological exam completely normal. No objective focal weakness or balance  deficits. Given h/o of CVA, new subjective reported symptoms, unwitnessed falls will get CT head/cervical spine.  Will also work up CP given h/o CAD, risk factors and single episode of non exertional CP with nausea 3 days ago.   CT scan head/cervical spine negative for acute findings.  CBC unremarkable. BMP remarkable for elevated creatinine 2.09 and GFR 34 (unchanged from baseline).  EKG nonischemic.  CXR unremarkable. I consulted neurology for recommendations (  Dr. Leonel Ramsay) who recommended MRI in ED and discharge with close neurology f/u.  Admission if abnormal.  At shift change, BNP, I-troponin, U/A pending. Patient was handed off to oncoming PA who will f/u on MRI results, BNP, troponin and U/A. Suspect discharge with neurology f/u if MRI negative.  Patient will need education on ambulation at home and fall precautions if discharged.  Patient, ED treatment and discharge plan was discussed with supervising physician who is agreeable with plan.   Final Clinical Impressions(s) / ED Diagnoses   Final diagnoses:  Fall    New Prescriptions New Prescriptions   No medications on file     Kinnie Feil, Hershal Coria 05/15/16 [place], MD 05/16/16 1431

## 2016-05-15 NOTE — Consult Note (Signed)
Referring Physician: Dr. Dayna Barker    Chief Complaint: Dizziness  HPI: Caleb Pilot Sr. is an 76 y.o. male who presented to the ED for evaluation of sensation of being off balance while walking. He also endorses a sensation of vertigo. Symptoms began on Sunday night. He has fallen a total of 3 times. He also feels that new left sided weakness has accompanied the unsteadiness. Of note, the patient is legally blind and able to see light/dark only; he states his vision loss is chronic and from a variety of conditions, including cataracts; he also has a diagnosis of diabetic retinopathy.   His PMHx includes DM2, diabetic peripheral neuropathy, CKD, CAD and recent stroke with residual right hemiparesis and balance difficulty (parietal infarction with [facility] in February).   Home medications include ASA and lovastatin.     MRI obtained in the ED revealed a subcentimeter acute/ early subacute infarction within the left lateral cerebellar hemisphere. Also noted are stable chronic microvascular ischemic changes and parenchymal volume loss of the brain.  Past Medical History:  Diagnosis Date  . Blindness   . CAD (coronary artery disease)   . Cholelithiasis   . CTS (carpal tunnel syndrome)    Left  . DM type 2 with diabetic peripheral neuropathy ([place])   . ED (erectile dysfunction)   . Elevated PSA   . GERD (gastroesophageal reflux disease)   . Glaucoma   . HTN (hypertension)   . Legal blindness due to diabetes mellitus ([place])   . MI (myocardial infarction) ([place]) 1991  . Thyroid nodule   . Type II or unspecified type diabetes mellitus without mention of complication, not stated as uncontrolled     Past Surgical History:  Procedure Laterality Date  . CHOLECYSTECTOMY      Family History  Problem Relation Age of Onset  . Hypertension Mother   . Kidney disease Father   . Hypertension Father   . Coronary artery disease      1st degree Male relative <60  . Diabetes      1st degree relative    Social History:  reports that he has quit smoking. He has never used smokeless tobacco. He reports that he does not drink alcohol or use drugs.  Allergies:  Allergies  Allergen Reactions  . Hydroxyzine Other (See Comments)    Visual hallucinations  . Verapamil Other (See Comments)    Hallucinations   . Benazepril Hcl Cough    Medications:  Prior to Admission:  Prescriptions Prior to Admission  Medication Sig Dispense Refill Last Dose  . aspirin EC 325 MG EC tablet Take 1 tablet (325 mg total) by mouth daily. 30 tablet 0 Taking  . cholecalciferol (VITAMIN D) 1000 units tablet Take 1 tablet (1,000 Units total) by mouth daily. 100 tablet 3 Taking  . Emollient (EUCERIN) lotion Apply topically as needed for dry skin. Use bid 480 mL 3 Taking  . finasteride (PROSCAR) 5 MG tablet Take 1 tablet (5 mg total) by mouth daily. For Prostate 90 tablet 3 Taking  . FLUoxetine (PROZAC) 20 MG tablet Take 1 tablet (20 mg total) by mouth daily. 30 tablet 5 Taking  . hydrALAZINE (APRESOLINE) 10 MG tablet Take 1 tablet (10 mg total) by mouth every 8 (eight) hours. 90 tablet 1 Taking  . linaclotide (LINZESS) 290 MCG CAPS capsule Take 1 capsule (290 mcg total) by mouth daily. 30 capsule 1 Taking  . linagliptin (TRADJENTA) 5 MG TABS tablet Take 1 tablet (5 mg total) by mouth daily. [place]  tablet 1 Taking  . losartan (COZAAR) 100 MG tablet Take 1 tablet (100 mg total) by mouth at bedtime. 30 tablet 1 Taking  . lovastatin (MEVACOR) 20 MG tablet Take 1 tablet (20 mg total) by mouth daily. For Cholesterol 90 tablet 3 Taking  . metoprolol succinate (TOPROL-XL) 25 MG 24 hr tablet Take 3 tablets (75 mg total) by mouth daily. Take with or immediately following a meal. 30 tablet 1 Taking  . pantoprazole (PROTONIX) 40 MG tablet Take 1 tablet (40 mg total) by mouth daily. 30 tablet 0 Taking  . polyethylene glycol (MIRALAX / GLYCOLAX) packet Take 17 g by mouth daily. (Patient taking differently: Take 17 g by mouth daily as  needed for moderate constipation. ) 30 each 5 Taking   Scheduled: .  stroke: mapping our early stages of recovery book   Does not apply Once  . aspirin  325 mg Oral Daily  . enoxaparin (LOVENOX) injection  40 mg Subcutaneous QHS  . finasteride  5 mg Oral Daily  . FLUoxetine  20 mg Oral Daily  . hydrALAZINE  10 mg Oral Q8H  . insulin aspart  0-5 Units Subcutaneous QHS  . insulin aspart  0-9 Units Subcutaneous TID WC  . insulin aspart  5 Units Subcutaneous STAT  . linaclotide  290 mcg Oral QAC breakfast  . losartan  100 mg Oral QHS  . metoprolol succinate  75 mg Oral Daily  . pantoprazole  40 mg Oral Daily  . pravastatin  20 mg Oral q1800    ROS: No current SOB or chest pain. Other ROS as per HPI.   Physical Examination: Blood pressure (!) 191/96, pulse 72, temperature 98.4 F (36.9 C), temperature source Oral, resp. rate 17, height _0  (1.676 m), weight 95.3 kg (210 lb), SpO2 100 %.  HEENT: [place]/AT. Left corneal opacity noted.  Lungs: Respirations unlabored Ext: Warm and well-perfused.  Neurologic Examination: Mental Status: Alert, oriented, thought content appropriate.  Speech fluent without evidence of aphasia.  Able to follow all commands without difficulty. Cranial Nerves: II:  Visual loss OU; able to sense light/dark with right eye. Pupils unreactive at 3 mm bilaterally.  III,IV, VI: Ptosis on the left, able to open to command. Spontaneous left beating nystagmus bilaterally. Horizontal and vertical saccades are normal.  V,VII: smile symmetric, facial temp sensation normal bilaterally VIII: hearing intact to conversation IX,X: no hypophonia or hoarseness XI: No asymmetry XII: Unremarkable Motor: RUE: 5/5 RLE: 5/5 LUE: 4+/5 LLE: 4+/5 Sensory: Temperature and light touch sensation intact to proximal upper and lower extremities without extinction.  Deep Tendon Reflexes:  2+ bilateral upper and lower extremities.  Plantars: Right: downgoing   Left: downgoing Cerebellar:  No ataxia with finger-to-navel bilaterally. Some dysdiadochokinesia with RAM bilaterally. No ataxia with heel-shin bilaterally.  Gait: Deferred   Results for orders placed or performed during the hospital encounter of 05/15/16 (from the past 48 hour(s))  Basic metabolic panel     Status: Abnormal   Collection Time: 05/15/16 11:14 AM  Result Value Ref Range   Sodium 135 135 - 145 mmol/L   Potassium 4.9 3.5 - 5.1 mmol/L   Chloride 105 101 - 111 mmol/L   CO2 24 22 - 32 mmol/L   Glucose, Bld 328 (H) 65 - 99 mg/dL   BUN 25 (H) 6 - 20 mg/dL   Creatinine, Ser 2.09 (H) 0.61 - 1.24 mg/dL   Calcium 9.4 8.9 - 10.3 mg/dL   GFR calc non Af Amer 29 (L) >60  mL/min   GFR calc Af Amer 34 (L) >60 mL/min    Comment: (NOTE) The eGFR has been calculated using the CKD EPI equation. This calculation has not been validated in all clinical situations. eGFR's persistently <60 mL/min signify possible Chronic Kidney Disease.    Anion gap 6 5 - 15  CBC     Status: None   Collection Time: 05/15/16 11:14 AM  Result Value Ref Range   WBC 8.7 4.0 - 10.5 K/uL   RBC 4.93 4.22 - 5.81 MIL/uL   Hemoglobin 13.4 13.0 - 17.0 g/dL   HCT 40.3 39.0 - 52.0 %   MCV 81.7 78.0 - 100.0 fL   MCH 27.2 26.0 - 34.0 pg   MCHC 33.3 30.0 - 36.0 g/dL   RDW 13.6 11.5 - 15.5 %   Platelets 198 150 - 400 K/uL  Brain natriuretic peptide     Status: Abnormal   Collection Time: 05/15/16  4:14 PM  Result Value Ref Range   B Natriuretic Peptide 108.6 (H) 0.0 - 100.0 pg/mL  Urinalysis, Routine w reflex microscopic     Status: Abnormal   Collection Time: 05/15/16  4:31 PM  Result Value Ref Range   Color, Urine YELLOW YELLOW   APPearance HAZY (A) CLEAR   Specific Gravity, Urine 1.025 1.005 - 1.030   pH 5.0 5.0 - 8.0   Glucose, UA >=500 (A) NEGATIVE mg/dL   Hgb urine dipstick SMALL (A) NEGATIVE   Bilirubin Urine NEGATIVE NEGATIVE   Ketones, ur NEGATIVE NEGATIVE mg/dL   Protein, ur 100 (A) NEGATIVE mg/dL   Nitrite NEGATIVE NEGATIVE    Leukocytes, UA NEGATIVE NEGATIVE   RBC / HPF 0-5 0 - 5 RBC/hpf   WBC, UA 0-5 0 - 5 WBC/hpf   Bacteria, UA NONE SEEN NONE SEEN   Squamous Epithelial / LPF 0-5 (A) NONE SEEN   Mucous PRESENT    Hyaline Casts, UA PRESENT   I-Stat Troponin, ED (not at [facility])     Status: None   Collection Time: 05/15/16  4:40 PM  Result Value Ref Range   Troponin i, poc 0.00 0.00 - 0.08 ng/mL   Comment 3            Comment: Due to the release kinetics of cTnI, a negative result within the first hours of the onset of symptoms does not rule out myocardial infarction with certainty. If myocardial infarction is still suspected, repeat the test at appropriate intervals.    Dg Chest 2 View  Result Date: 05/15/2016 CLINICAL DATA:  Gait imbalance beginning last night. EXAM: CHEST  2 VIEW COMPARISON:  Chest radiograph March 21, 2016 FINDINGS: Cardiac silhouette is upper limits of normal in size, status post median sternotomy for CABG. No pleural effusion or focal consolidation. No pneumothorax. Soft tissue planes and included osseous structures are nonsuspicious. IMPRESSION: Stable examination: Borderline cardiomegaly, no acute pulmonary process. Electronically Signed   By: Elon Alas M.D.   On: 05/15/2016 14:58   Ct Head Wo Contrast  Result Date: 05/15/2016 CLINICAL DATA:  Gait imbalance beginning last night. Unwitnessed fall 2 days ago. History of diabetes, hypertension and stroke. EXAM: CT HEAD WITHOUT CONTRAST CT CERVICAL SPINE WITHOUT CONTRAST TECHNIQUE: Multidetector CT imaging of the head and cervical spine was performed following the standard protocol without intravenous contrast. Multiplanar CT image reconstructions of the cervical spine were also generated. COMPARISON:  MRI of the head March 13, 2016 FINDINGS: CT HEAD FINDINGS BRAIN: No intraparenchymal hemorrhage, mass effect nor midline shift.  Old LEFT basal ganglia lacunar infarct with mild ex vacuo dilatation of the frontal horn of the lateral  ventricles. Moderate to severe ventriculomegaly on the basis of global parenchymal brain volume loss. Old LEFT thalamus lacunar infarct. Small old LEFT cerebellar infarcts. Patchy supratentorial white matter hypodensities within normal range for patient's age, though non-specific are most compatible with chronic small vessel ischemic disease. No acute large vascular territory infarcts. No abnormal extra-axial fluid collections. Basal cisterns are patent. VASCULAR: Moderate calcific atherosclerosis of the carotid siphons. SKULL: No skull fracture. No significant scalp soft tissue swelling. Small nodule RIGHT frontal scalp, recommend direct inspection. SINUSES/ORBITS: The mastoid air-cells and included paranasal sinuses are well-aerated. LEFT frontal osteoma. The included ocular globes and orbital contents are non-suspicious. Status post bilateral ocular lens implants with RIGHT glaucoma drainage device. OTHER: None. CT CERVICAL SPINE FINDINGS ALIGNMENT: Straightened lordosis. Vertebral bodies in alignment. SKULL BASE AND VERTEBRAE: Cervical vertebral bodies and posterior elements are intact. Moderate to severe C4-5 and C5-6 disc height loss with endplate spurring and uncovertebral hypertrophy compatible with degenerative discs. C1-2 articulation maintained with moderate arthropathy. Borderline congenital canal narrowing on the basis of foreshortened pedicles. SOFT TISSUES AND SPINAL CANAL: Nonacute. Moderate calcific atherosclerosis carotid bifurcations. DISC LEVELS: Moderate RIGHT C3-4, moderate to severe C4-5, moderate C5-6 neural foraminal narrowing. UPPER CHEST: Lung apices are clear. OTHER: None. IMPRESSION: CT HEAD: No acute intracranial process. Stable examination including moderate to severe global parenchymal brain volume loss and multiple old small infarcts. CT CERVICAL SPINE: No acute fracture or malalignment. Moderate to severe C4-5 neural foraminal narrowing. Electronically Signed   By: Elon Alas  M.D.   On: 05/15/2016 15:07   Ct Cervical Spine Wo Contrast  Result Date: 05/15/2016 CLINICAL DATA:  Gait imbalance beginning last night. Unwitnessed fall 2 days ago. History of diabetes, hypertension and stroke. EXAM: CT HEAD WITHOUT CONTRAST CT CERVICAL SPINE WITHOUT CONTRAST TECHNIQUE: Multidetector CT imaging of the head and cervical spine was performed following the standard protocol without intravenous contrast. Multiplanar CT image reconstructions of the cervical spine were also generated. COMPARISON:  MRI of the head March 13, 2016 FINDINGS: CT HEAD FINDINGS BRAIN: No intraparenchymal hemorrhage, mass effect nor midline shift. Old LEFT basal ganglia lacunar infarct with mild ex vacuo dilatation of the frontal horn of the lateral ventricles. Moderate to severe ventriculomegaly on the basis of global parenchymal brain volume loss. Old LEFT thalamus lacunar infarct. Small old LEFT cerebellar infarcts. Patchy supratentorial white matter hypodensities within normal range for patient's age, though non-specific are most compatible with chronic small vessel ischemic disease. No acute large vascular territory infarcts. No abnormal extra-axial fluid collections. Basal cisterns are patent. VASCULAR: Moderate calcific atherosclerosis of the carotid siphons. SKULL: No skull fracture. No significant scalp soft tissue swelling. Small nodule RIGHT frontal scalp, recommend direct inspection. SINUSES/ORBITS: The mastoid air-cells and included paranasal sinuses are well-aerated. LEFT frontal osteoma. The included ocular globes and orbital contents are non-suspicious. Status post bilateral ocular lens implants with RIGHT glaucoma drainage device. OTHER: None. CT CERVICAL SPINE FINDINGS ALIGNMENT: Straightened lordosis. Vertebral bodies in alignment. SKULL BASE AND VERTEBRAE: Cervical vertebral bodies and posterior elements are intact. Moderate to severe C4-5 and C5-6 disc height loss with endplate spurring and  uncovertebral hypertrophy compatible with degenerative discs. C1-2 articulation maintained with moderate arthropathy. Borderline congenital canal narrowing on the basis of foreshortened pedicles. SOFT TISSUES AND SPINAL CANAL: Nonacute. Moderate calcific atherosclerosis carotid bifurcations. DISC LEVELS: Moderate RIGHT C3-4, moderate to severe C4-5, moderate C5-6 neural foraminal  narrowing. UPPER CHEST: Lung apices are clear. OTHER: None. IMPRESSION: CT HEAD: No acute intracranial process. Stable examination including moderate to severe global parenchymal brain volume loss and multiple old small infarcts. CT CERVICAL SPINE: No acute fracture or malalignment. Moderate to severe C4-5 neural foraminal narrowing. Electronically Signed   By: Elon Alas M.D.   On: 05/15/2016 15:07   Mr Brain Wo Contrast  Result Date: 05/15/2016 CLINICAL DATA:  Unwitnessed fall in new subjective balance deficits. EXAM: MRI HEAD WITHOUT CONTRAST TECHNIQUE: Multiplanar, multiecho pulse sequences of the brain and surrounding structures were obtained without intravenous contrast. COMPARISON:  03/13/2016 MRI of the head. FINDINGS: Brain: Subcentimeter focus of diffusion restriction within the lateral left cerebellar hemisphere compatible with acute/ early subacute infarction. No additional diffusion signal abnormality of the brain. Stable foci of T2 FLAIR hyperintense signal abnormality within subcortical and periventricular white matter as well as the pons compatible with chronic microvascular ischemic changes. Stable brain parenchymal volume loss. Stable small chronic lacunar infarctions within the left thalamus and left cerebellar hemisphere. No new susceptibility hypointensity to indicate intracranial hemorrhage. Ventricle size is commensurate to the degree of volume loss. No focal mass effect. No extra-axial collection. No effacement of basilar cisterns. Vascular: Normal flow voids. Skull and upper cervical spine: Normal marrow  signal. Sinuses/Orbits: Negative. Other: None. IMPRESSION: 1. Subcentimeter acute/ early subacute infarction within the left lateral cerebellar hemisphere. No acute hemorrhage identified. 2. Stable chronic microvascular ischemic changes and parenchymal volume loss of the brain. These results were called by telephone at the time of interpretation on 05/15/2016 at 6:38 pm to [place], who verbally acknowledged these results. Electronically Signed   By: Kristine Garbe M.D.   On: 05/15/2016 18:41    Assessment: 76 y.o. male with subacute left cerebellar ischemic infarction.  1. Classifiable as having failed ASA monotherapy.  2. Stroke Risk Factors - CAD, DM2, HTN, prior MI, prior stroke.  3. Legally blind. 4. Recent prior stroke.   Plan: 1. Add Plavix to ASA.   2. MRA of brain without contrast 3. Echocardiogram 4. Carotid dopplers 5. PT consult, OT consult, Speech consult 6. Continue pravastatin 7. BP management. Out of permissive HTN time window  8. Telemetry monitoring 9. Frequent neuro checks 10. HgbA1c, fasting lipid panel   _0  signed: Dr. Kerney Elbe  05/15/2016, 7:29 PM

## 2016-05-15 NOTE — ED Notes (Signed)
Patient transported to MRI 

## 2016-05-15 NOTE — Telephone Encounter (Signed)
Pt son called in and said that they needs a letter stating that pt needs 24 hour care.  They are trying to get a nurses aid or in a home.  Can this be done?    Pt would like a call today ?   Pt would like to pick this up if this can be done?    Best number [phone]

## 2016-05-15 NOTE — ED Triage Notes (Signed)
Pt states since last night while he's walking he feels off balance, pt denies any falling. Pt is is legally blind. Speech is clear, grip strengths are equal no drift in arms or legs. No other neuro deficits in triage. Pt is alert and 0x4.

## 2016-05-16 ENCOUNTER — Encounter (HOSPITAL_COMMUNITY): Payer: Self-pay

## 2016-05-16 ENCOUNTER — Inpatient Hospital Stay (HOSPITAL_COMMUNITY): Payer: Medicare HMO

## 2016-05-16 ENCOUNTER — Ambulatory Visit: Payer: Self-pay | Admitting: Neurology

## 2016-05-16 DIAGNOSIS — W19XXXA Unspecified fall, initial encounter: Secondary | ICD-10-CM

## 2016-05-16 DIAGNOSIS — N183 Chronic kidney disease, stage 3 (moderate): Secondary | ICD-10-CM

## 2016-05-16 DIAGNOSIS — I10 Essential (primary) hypertension: Secondary | ICD-10-CM

## 2016-05-16 DIAGNOSIS — Z8673 Personal history of transient ischemic attack (TIA), and cerebral infarction without residual deficits: Secondary | ICD-10-CM

## 2016-05-16 DIAGNOSIS — I63342 Cerebral infarction due to thrombosis of left cerebellar artery: Secondary | ICD-10-CM

## 2016-05-16 DIAGNOSIS — E11311 Type 2 diabetes mellitus with unspecified diabetic retinopathy with macular edema: Secondary | ICD-10-CM

## 2016-05-16 LAB — GLUCOSE, CAPILLARY
GLUCOSE-CAPILLARY: 128 mg/dL — AB (ref 65–99)
GLUCOSE-CAPILLARY: 184 mg/dL — AB (ref 65–99)
Glucose-Capillary: 114 mg/dL — ABNORMAL HIGH (ref 65–99)
Glucose-Capillary: 152 mg/dL — ABNORMAL HIGH (ref 65–99)

## 2016-05-16 LAB — BASIC METABOLIC PANEL
ANION GAP: 7 (ref 5–15)
BUN: 22 mg/dL — ABNORMAL HIGH (ref 6–20)
CALCIUM: 9 mg/dL (ref 8.9–10.3)
CO2: 25 mmol/L (ref 22–32)
Chloride: 106 mmol/L (ref 101–111)
Creatinine, Ser: 1.86 mg/dL — ABNORMAL HIGH (ref 0.61–1.24)
GFR calc Af Amer: 39 mL/min — ABNORMAL LOW (ref >60)
GFR, EST NON AFRICAN AMERICAN: 34 mL/min — AB (ref >60)
GLUCOSE: 177 mg/dL — AB (ref 65–99)
POTASSIUM: 4.2 mmol/L (ref 3.5–5.1)
SODIUM: 138 mmol/L (ref 135–145)

## 2016-05-16 LAB — CBC WITH DIFFERENTIAL/PLATELET
BASOS ABS: 0 10*3/uL (ref 0.0–0.1)
BASOS PCT: 0 %
EOS PCT: 2 %
Eosinophils Absolute: 0.1 10*3/uL (ref 0.0–0.7)
HCT: 38.4 % — ABNORMAL LOW (ref 39.0–52.0)
Hemoglobin: 12.4 g/dL — ABNORMAL LOW (ref 13.0–17.0)
LYMPHS PCT: 36 %
Lymphs Abs: 2.1 10*3/uL (ref 0.7–4.0)
MCH: 26.3 pg (ref 26.0–34.0)
MCHC: 32.3 g/dL (ref 30.0–36.0)
MCV: 81.5 fL (ref 78.0–100.0)
MONO ABS: 0.4 10*3/uL (ref 0.1–1.0)
Monocytes Relative: 7 %
Neutro Abs: 3.3 10*3/uL (ref 1.7–7.7)
Neutrophils Relative %: 55 %
PLATELETS: 170 10*3/uL (ref 150–400)
RBC: 4.71 MIL/uL (ref 4.22–5.81)
RDW: 13.9 % (ref 11.5–15.5)
WBC: 5.9 10*3/uL (ref 4.0–10.5)

## 2016-05-16 LAB — LIPID PANEL
CHOL/HDL RATIO: 3.6 ratio
Cholesterol: 131 mg/dL (ref 0–200)
HDL: 36 mg/dL — AB (ref >40)
LDL CALC: 72 mg/dL (ref 0–99)
TRIGLYCERIDES: 117 mg/dL (ref <150)
VLDL: 23 mg/dL (ref 0–40)

## 2016-05-16 MED ORDER — CLOPIDOGREL BISULFATE 75 MG PO TABS
75.0000 mg | ORAL_TABLET | Freq: Every day | ORAL | Status: DC
  Administered 2016-05-16 – 2016-05-17 (×2): 75 mg via ORAL
  Filled 2016-05-16 (×2): qty 1

## 2016-05-16 NOTE — Progress Notes (Signed)
PROGRESS NOTE    Caleb Kotch Sr.  ZWC:[id number] DOB: Aug 29, 1940 DOA: 05/15/2016 PCP: Walker Kehr, MD   Brief Narrative: 76 y.o. male with medical history significant of HTN, DM type 2, CAD s/p CABG, CVA in 02/2016, and legally blind; who presents with complaints of feeling off balance and not quite himself.MRI of the brain reveals signs of acute/subacute stroke of the left lateral cerebellar hemisphere. Neurology was consulted.  Assessment & Plan:  #  Acute/subacute infarction of left lateral cerebellar hemisphere: -MRA unremarkable -Patient had echocardiogram on 2/21 showed normal LV size with EF of 55% -Also had vascular Doppler ultrasound of the intracranial vessels on 2/23, defer to neurology for further investigation. -Added Plavix, continue aspirin and a statin. -PT, OT and social worker evaluation.  -LDL 72, A1c pending  #Falls: Continue physical therapy and supportive care  #Type 2 diabetes: Monitor blood sugar level. Follow-up A1c level.  #Chronic kidney disease is stage III: Creatinine level around baseline. Avoid nephrotoxins  #Essential hypertension: Continue hydralazine, losartan and metoprolol. Monitor blood pressure.  #Hyperlipidemia: Continue pravastatin. LDL acceptable  Principal Problem:   CVA (cerebral vascular accident) ([place]) Active Problems:   Coronary atherosclerosis   GERD   DM (diabetes mellitus), type 2 with ophthalmic complications ([place])   Sherran Needs syndrome   Stage 3 chronic kidney disease   Essential hypertension  DVT prophylaxis: Lovenox subcutaneous Code Status: Full code Family Communication: Discussed with the patient's son at bedside Disposition Plan: Likely discharge to rehabilitation in 1-2 days    Consultants:   Neurologist  Procedures: MRI Antimicrobials: None  Subjective: Patient was seen and examined at bedside. Denied headache, dizziness, nausea, vomiting, chest pain, shortness of breath.  Objective: Vitals:     05/16/16 0200 05/16/16 0357 05/16/16 0600 05/16/16 0957  BP: (!) 149/97 (!) 170/88 (!) 166/86 (!) 162/78  Pulse: 88 87 97 88  Resp:    18  Temp: 98.2 F (36.8 C) 98.2 F (36.8 C) 97.5 F (36.4 C) 98.5 F (36.9 C)  TempSrc: Oral Oral Oral Oral  SpO2: 100% 100% 100% 100%  Weight:      Height:       No intake or output data in the 24 hours ending 05/16/16 1333 Filed Weights   05/15/16 1034 05/15/16 2200  Weight: 95.3 kg (210 lb) 87 kg (191 lb 14.4 oz)    Examination:  General exam: Appears calm and comfortable , Legally blind Respiratory system: Clear to auscultation. Respiratory effort normal. No wheezing or crackle Cardiovascular system: S1 & S2 heard, RRR.  No pedal edema. Gastrointestinal system: Abdomen is nondistended, soft and nontender. Normal bowel sounds heard. Central nervous system: Alert awake and following commands Skin: No rashes, lesions or ulcers    Data Reviewed: I have personally reviewed following labs and imaging studies  CBC:  Recent Labs Lab 05/15/16 1114 05/16/16 0850  WBC 8.7 5.9  NEUTROABS  --  3.3  HGB 13.4 12.4*  HCT 40.3 38.4*  MCV 81.7 81.5  PLT 198 235   Basic Metabolic Panel:  Recent Labs Lab 05/15/16 1114 05/16/16 0850  NA 135 138  K 4.9 4.2  CL 105 106  CO2 24 25  GLUCOSE 328* 177*  BUN 25* 22*  CREATININE 2.09* 1.86*  CALCIUM 9.4 9.0   GFR: Estimated Creatinine Clearance: 36.2 mL/min (A) (by C-G formula based on SCr of 1.86 mg/dL (H)). Liver Function Tests: No results for input(s): AST, ALT, ALKPHOS, BILITOT, PROT, ALBUMIN in the last 168 hours. No results for  input(s): LIPASE, AMYLASE in the last 168 hours. No results for input(s): AMMONIA in the last 168 hours. Coagulation Profile: No results for input(s): INR, PROTIME in the last 168 hours. Cardiac Enzymes:  Recent Labs Lab 05/15/16 2241  TROPONINI <0.03   BNP (last 3 results) No results for input(s): PROBNP in the last 8760 hours. HbA1C: No results  for input(s): HGBA1C in the last 72 hours. CBG:  Recent Labs Lab 05/15/16 2058 05/15/16 2208 05/16/16 0636 05/16/16 1204  GLUCAP 148* 195* 128* 114*   Lipid Profile:  Recent Labs  05/16/16 0850  CHOL 131  HDL 36*  LDLCALC 72  TRIG 117  CHOLHDL 3.6   Thyroid Function Tests: No results for input(s): TSH, T4TOTAL, FREET4, T3FREE, THYROIDAB in the last 72 hours. Anemia Panel: No results for input(s): VITAMINB12, FOLATE, FERRITIN, TIBC, IRON, RETICCTPCT in the last 72 hours. Sepsis Labs: No results for input(s): PROCALCITON, LATICACIDVEN in the last 168 hours.  No results found for this or any previous visit (from the past 240 hour(s)).       Radiology Studies: Dg Chest 2 View  Result Date: 05/15/2016 CLINICAL DATA:  Gait imbalance beginning last night. EXAM: CHEST  2 VIEW COMPARISON:  Chest radiograph March 21, 2016 FINDINGS: Cardiac silhouette is upper limits of normal in size, status post median sternotomy for CABG. No pleural effusion or focal consolidation. No pneumothorax. Soft tissue planes and included osseous structures are nonsuspicious. IMPRESSION: Stable examination: Borderline cardiomegaly, no acute pulmonary process. Electronically Signed   By: Elon Alas M.D.   On: 05/15/2016 14:58   Ct Head Wo Contrast  Result Date: 05/15/2016 CLINICAL DATA:  Gait imbalance beginning last night. Unwitnessed fall 2 days ago. History of diabetes, hypertension and stroke. EXAM: CT HEAD WITHOUT CONTRAST CT CERVICAL SPINE WITHOUT CONTRAST TECHNIQUE: Multidetector CT imaging of the head and cervical spine was performed following the standard protocol without intravenous contrast. Multiplanar CT image reconstructions of the cervical spine were also generated. COMPARISON:  MRI of the head March 13, 2016 FINDINGS: CT HEAD FINDINGS BRAIN: No intraparenchymal hemorrhage, mass effect nor midline shift. Old LEFT basal ganglia lacunar infarct with mild ex vacuo dilatation of the  frontal horn of the lateral ventricles. Moderate to severe ventriculomegaly on the basis of global parenchymal brain volume loss. Old LEFT thalamus lacunar infarct. Small old LEFT cerebellar infarcts. Patchy supratentorial white matter hypodensities within normal range for patient's age, though non-specific are most compatible with chronic small vessel ischemic disease. No acute large vascular territory infarcts. No abnormal extra-axial fluid collections. Basal cisterns are patent. VASCULAR: Moderate calcific atherosclerosis of the carotid siphons. SKULL: No skull fracture. No significant scalp soft tissue swelling. Small nodule RIGHT frontal scalp, recommend direct inspection. SINUSES/ORBITS: The mastoid air-cells and included paranasal sinuses are well-aerated. LEFT frontal osteoma. The included ocular globes and orbital contents are non-suspicious. Status post bilateral ocular lens implants with RIGHT glaucoma drainage device. OTHER: None. CT CERVICAL SPINE FINDINGS ALIGNMENT: Straightened lordosis. Vertebral bodies in alignment. SKULL BASE AND VERTEBRAE: Cervical vertebral bodies and posterior elements are intact. Moderate to severe C4-5 and C5-6 disc height loss with endplate spurring and uncovertebral hypertrophy compatible with degenerative discs. C1-2 articulation maintained with moderate arthropathy. Borderline congenital canal narrowing on the basis of foreshortened pedicles. SOFT TISSUES AND SPINAL CANAL: Nonacute. Moderate calcific atherosclerosis carotid bifurcations. DISC LEVELS: Moderate RIGHT C3-4, moderate to severe C4-5, moderate C5-6 neural foraminal narrowing. UPPER CHEST: Lung apices are clear. OTHER: None. IMPRESSION: CT HEAD: No acute intracranial process.  Stable examination including moderate to severe global parenchymal brain volume loss and multiple old small infarcts. CT CERVICAL SPINE: No acute fracture or malalignment. Moderate to severe C4-5 neural foraminal narrowing. Electronically  Signed   By: Elon Alas M.D.   On: 05/15/2016 15:07   Ct Cervical Spine Wo Contrast  Result Date: 05/15/2016 CLINICAL DATA:  Gait imbalance beginning last night. Unwitnessed fall 2 days ago. History of diabetes, hypertension and stroke. EXAM: CT HEAD WITHOUT CONTRAST CT CERVICAL SPINE WITHOUT CONTRAST TECHNIQUE: Multidetector CT imaging of the head and cervical spine was performed following the standard protocol without intravenous contrast. Multiplanar CT image reconstructions of the cervical spine were also generated. COMPARISON:  MRI of the head March 13, 2016 FINDINGS: CT HEAD FINDINGS BRAIN: No intraparenchymal hemorrhage, mass effect nor midline shift. Old LEFT basal ganglia lacunar infarct with mild ex vacuo dilatation of the frontal horn of the lateral ventricles. Moderate to severe ventriculomegaly on the basis of global parenchymal brain volume loss. Old LEFT thalamus lacunar infarct. Small old LEFT cerebellar infarcts. Patchy supratentorial white matter hypodensities within normal range for patient's age, though non-specific are most compatible with chronic small vessel ischemic disease. No acute large vascular territory infarcts. No abnormal extra-axial fluid collections. Basal cisterns are patent. VASCULAR: Moderate calcific atherosclerosis of the carotid siphons. SKULL: No skull fracture. No significant scalp soft tissue swelling. Small nodule RIGHT frontal scalp, recommend direct inspection. SINUSES/ORBITS: The mastoid air-cells and included paranasal sinuses are well-aerated. LEFT frontal osteoma. The included ocular globes and orbital contents are non-suspicious. Status post bilateral ocular lens implants with RIGHT glaucoma drainage device. OTHER: None. CT CERVICAL SPINE FINDINGS ALIGNMENT: Straightened lordosis. Vertebral bodies in alignment. SKULL BASE AND VERTEBRAE: Cervical vertebral bodies and posterior elements are intact. Moderate to severe C4-5 and C5-6 disc height loss with  endplate spurring and uncovertebral hypertrophy compatible with degenerative discs. C1-2 articulation maintained with moderate arthropathy. Borderline congenital canal narrowing on the basis of foreshortened pedicles. SOFT TISSUES AND SPINAL CANAL: Nonacute. Moderate calcific atherosclerosis carotid bifurcations. DISC LEVELS: Moderate RIGHT C3-4, moderate to severe C4-5, moderate C5-6 neural foraminal narrowing. UPPER CHEST: Lung apices are clear. OTHER: None. IMPRESSION: CT HEAD: No acute intracranial process. Stable examination including moderate to severe global parenchymal brain volume loss and multiple old small infarcts. CT CERVICAL SPINE: No acute fracture or malalignment. Moderate to severe C4-5 neural foraminal narrowing. Electronically Signed   By: Elon Alas M.D.   On: 05/15/2016 15:07   [name] Head Wo Contrast  Result Date: 05/16/2016 CLINICAL DATA:  Acute presentation with balance disturbance and left cerebellar infarction. EXAM: MRA HEAD WITHOUT CONTRAST TECHNIQUE: Angiographic images of the Circle of Willis were obtained using MRA technique without intravenous contrast. COMPARISON:  05/15/2016 FINDINGS: Both internal carotid arteries are widely patent into the brain. The anterior and middle cerebral vessels are normal without proximal stenosis, aneurysm or vascular malformation. Both vertebral arteries are widely patent to the basilar. No basilar stenosis. Dominant PICA, the origin of which is not included in the region. Anterior inferior cerebellar arteries patent bilaterally, larger on the right. Superior cerebellar and posterior cerebral arteries patent and normal. IMPRESSION: Within normal limits. No large or medium vessel stenosis or occlusion. Electronically Signed   By: Nelson Chimes M.D.   On: 05/16/2016 11:37   Mr Brain Wo Contrast  Result Date: 05/15/2016 CLINICAL DATA:  Unwitnessed fall in new subjective balance deficits. EXAM: MRI HEAD WITHOUT CONTRAST TECHNIQUE: Multiplanar,  multiecho pulse sequences of the brain and surrounding  structures were obtained without intravenous contrast. COMPARISON:  03/13/2016 MRI of the head. FINDINGS: Brain: Subcentimeter focus of diffusion restriction within the lateral left cerebellar hemisphere compatible with acute/ early subacute infarction. No additional diffusion signal abnormality of the brain. Stable foci of T2 FLAIR hyperintense signal abnormality within subcortical and periventricular white matter as well as the pons compatible with chronic microvascular ischemic changes. Stable brain parenchymal volume loss. Stable small chronic lacunar infarctions within the left thalamus and left cerebellar hemisphere. No new susceptibility hypointensity to indicate intracranial hemorrhage. Ventricle size is commensurate to the degree of volume loss. No focal mass effect. No extra-axial collection. No effacement of basilar cisterns. Vascular: Normal flow voids. Skull and upper cervical spine: Normal marrow signal. Sinuses/Orbits: Negative. Other: None. IMPRESSION: 1. Subcentimeter acute/ early subacute infarction within the left lateral cerebellar hemisphere. No acute hemorrhage identified. 2. Stable chronic microvascular ischemic changes and parenchymal volume loss of the brain. These results were called by telephone at the time of interpretation on 05/15/2016 at 6:38 pm to [place], who verbally acknowledged these results. Electronically Signed   By: Kristine Garbe M.D.   On: 05/15/2016 18:41        Scheduled Meds: .  stroke: mapping our early stages of recovery book   Does not apply Once  . aspirin  325 mg Oral Daily  . clopidogrel  75 mg Oral Daily  . enoxaparin (LOVENOX) injection  40 mg Subcutaneous QHS  . finasteride  5 mg Oral Daily  . FLUoxetine  20 mg Oral Daily  . hydrALAZINE  10 mg Oral Q8H  . insulin aspart  0-5 Units Subcutaneous QHS  . insulin aspart  0-9 Units Subcutaneous TID WC  . insulin aspart  5 Units  Subcutaneous STAT  . linaclotide  290 mcg Oral QAC breakfast  . losartan  100 mg Oral QHS  . metoprolol succinate  75 mg Oral Daily  . pantoprazole  40 mg Oral Daily  . pravastatin  20 mg Oral q1800   Continuous Infusions: . sodium chloride 75 mL/hr (05/15/16 2052)     LOS: 1 day    Tehani Mersman Tanna Furry, MD [facility] Pager [phone]  If 7PM-7AM, please contact night-coverage [email or web address] Password [email or web address] 05/16/2016, 1:33 PM

## 2016-05-16 NOTE — Telephone Encounter (Signed)
Yes Pls write a letter citing blindness and other med problems incl DM Thx

## 2016-05-16 NOTE — Progress Notes (Signed)
Pt arrived to unit via bed and walked to bed from hallway. No s/s of pain or discomfort at this time. VS taken and pt oriented to unit.

## 2016-05-16 NOTE — Progress Notes (Signed)
CM and CSW met with the patient and his brother at the request of patients brother. Family is concerned about the patient needing 24/7 care at d/c. CM provided him a private duty sitter list and encouraged the family to contact [employer] to see about pts benefits and see if he has any long term health benefits.  PT/OT have not assessed the patient yet. CSW also went over the possibility of patient needing rehab at d/c dependent upon PT/OT recs. CM following.

## 2016-05-16 NOTE — Telephone Encounter (Signed)
Letter written and picked up by son, Juluis Rainier below

## 2016-05-16 NOTE — Telephone Encounter (Signed)
Patients son called this morning. [name] informed we could have it for him in the next hour. He also states his dad had another stroke last night and is in the hospital.

## 2016-05-16 NOTE — Progress Notes (Signed)
STROKE TEAM PROGRESS NOTE   HISTORY OF PRESENT ILLNESS (per record) Caleb Poon Sr. is an 76 y.o. male who presented to the ED for evaluation of sensation of being off balance while walking. He also endorses a sensation of vertigo. Symptoms began on Sunday night 05/13/2016 ([facility]KW). He has fallen a total of 3 times. He also feels that new left sided weakness has accompanied the unsteadiness. Of note, the patient is legally blind and able to see light/dark only; he states his vision loss is chronic and from a variety of conditions, including cataracts; he also has a diagnosis of diabetic retinopathy.   His PMHx includes DM2, diabetic peripheral neuropathy, CKD, CAD and recent stroke with residual right hemiparesis and balance difficulty (parietal infarction with [facility] in February).   Home medications include ASA and lovastatin.    MRI obtained in the ED revealed a subcentimeter acute/ early subacute infarction within the left lateral cerebellar hemisphere. Also noted are stable chronic microvascular ischemic changes and parenchymal volume loss of the brain.  Patient was not administered IV t-PA secondary to delay in arrival. He was admitted for further evaluation and treatment.   SUBJECTIVE (INTERVAL HISTORY) No family at bedside. He is eating assisted by nursing assistant. He denies vertigo currently but admitted that he fell several times at home due to imbalance and dizziness. He is blind bilaterally and not even see flash light.    OBJECTIVE Temp:  [97.5 F (36.4 C)-98.5 F (36.9 C)] 98.5 F (36.9 C) (04/25 0957) Pulse Rate:  [68-97] 88 (04/25 0957) Cardiac Rhythm: Normal sinus rhythm (04/25 0700) Resp:  [12-20] 18 (04/25 0957) BP: (148-191)/(65-99) 162/78 (04/25 0957) SpO2:  [100 %] 100 % (04/25 0957) Weight:  [87 kg (191 lb 14.4 oz)] 87 kg (191 lb 14.4 oz) (04/24 2200)  CBC:  Recent Labs Lab 05/15/16 1114 05/16/16 0850  WBC 8.7 5.9  NEUTROABS  --  3.3  HGB 13.4 12.4*   HCT 40.3 38.4*  MCV 81.7 81.5  PLT 198 845    Basic Metabolic Panel:  Recent Labs Lab 05/15/16 1114 05/16/16 0850  NA 135 138  K 4.9 4.2  CL 105 106  CO2 24 25  GLUCOSE 328* 177*  BUN 25* 22*  CREATININE 2.09* 1.86*  CALCIUM 9.4 9.0    Lipid Panel:    Component Value Date/Time   CHOL 131 05/16/2016 0850   TRIG 117 05/16/2016 0850   HDL 36 (L) 05/16/2016 0850   CHOLHDL 3.6 05/16/2016 0850   VLDL 23 05/16/2016 0850   LDLCALC 72 05/16/2016 0850   HgbA1c:  Lab Results  Component Value Date   HGBA1C 7.5 (H) 04/12/2016   Urine Drug Screen: No results found for: LABOPIA, COCAINSCRNUR, LABBENZ, AMPHETMU, THCU, LABBARB  Alcohol Level No results found for: [place] I have personally reviewed the radiological images below and agree with the radiology interpretations.  Ct Head Wo Contrast 05/15/2016 No acute intracranial process. Stable examination including moderate to severe global parenchymal brain volume loss and multiple old small infarcts.   Ct Cervical Spine Wo Contrast 05/15/2016 No acute fracture or malalignment. Moderate to severe C4-5 neural foraminal narrowing.   Caleb Brain Wo Contrast 05/15/2016 1. Subcentimeter acute/ early subacute infarction within the left lateral cerebellar hemisphere. No acute hemorrhage identified. 2. Stable chronic microvascular ischemic changes and parenchymal volume loss of the brain.   Caleb Dunn Head Wo Contrast 05/16/2016 Within normal limits. No large or medium vessel stenosis or occlusion.    PHYSICAL EXAM  Temp:  [97.5 F (36.4 C)-98.7 F (37.1 C)] 98.7 F (37.1 C) (04/25 1752) Pulse Rate:  [65-97] 65 (04/25 1752) Resp:  [12-20] 18 (04/25 1752) BP: (148-181)/(61-99) 153/61 (04/25 1752) SpO2:  [100 %] 100 % (04/25 1752) Weight:  [191 lb 14.4 oz (87 kg)] 191 lb 14.4 oz (87 kg) (04/24 2200)  General - Well nourished, well developed, in no apparent distress.  Ophthalmologic - Fundi not visualized.  Cardiovascular -  Regular rate and rhythm.  Mental Status -  Level of arousal and orientation to time, place, and person were intact. Language including expression, naming, repetition, comprehension was assessed and found intact, mild dysarthria  Cranial Nerves II - XII - II - total blind bilaterally, no light perception. III, IV, VI - Extraocular horizontal movements incomplete bilaterally.  V - Facial sensation intact bilaterally. VII - left nasolabial fold flattening. VIII - Hearing & vestibular intact bilaterally, spontaneous nystagmus, fast direction to the left. X - Palate elevates symmetrically, mild dysarthria. XI - Chin turning & shoulder shrug intact bilaterally. XII - Tongue protrusion intact.  Motor Strength - The patient's strength was normal in all extremities and pronator drift was absent.  Bulk was normal and fasciculations were absent.   Motor Tone - Muscle tone was assessed at the neck and appendages and was normal.  Reflexes - The patient's reflexes were 1+ in all extremities and he had no pathological reflexes.  Sensory - Light touch, temperature/pinprick were assessed and were symmetrical.    Coordination - The patient had normal movements in the hands with no ataxia or dysmetria.  Tremor was absent.  Gait and Station - deferred   ASSESSMENT/PLAN Caleb. Zakye Baby Sr. is a 76 y.o. male with history of previous stroke, HTN, HLD, DB, CAD, blindness, cataract, CKD, GERD, glaucoma, carpal tunnel syndrome, peripheral neuropathy, ED, elevated PSA presenting with dizziness, off balance when walking. He did not receive IV t-PA d/t delay in arrival.  Stroke:  Small L lateral cerebellar infarct likely secondary to small vessel disease source  Resultant  Dizziness and off balance  CT head no acute abnormality. Atrophy. Mult old small infarcts  CT CSpine no acute finding. Mod to severe C4-5 foraminal narrowing  MRI  Small L lateral cerebellar infarct. small vessel disease. Atrophy.  Old left cerebellar lacune  MRA  unremarkable  Carotid Doppler  Unremarkable 03/14/16  2D Echo  Normal EF 03/14/16  LDL 72  HgbA1c 7.5 in March  Lovenox 40 mg sq daily for VTE prophylaxis  Diet heart healthy/carb modified Room service appropriate? Yes; Fluid consistency: Thin  aspirin 325 mg daily prior to admission, now on aspirin 325 mg daily and clopidogrel 75 mg daily. Continue DAPT for 3 weeks and then plavix alone according to CHANCE trial  Patient counseled to be compliant with his antithrombotic medications  Ongoing aggressive stroke risk factor management  Therapy recommendations:  pending   Disposition:  pending (pt in senior apt, son pursuing 24h care prior to admission)  Hx of stroke  02/2016 Left parietal / CR infarct likely small vessel disease  02/2016 small L parietal SAH due to fall  Due to total blindness and fall risk not felt to be a good TEE/loop/event monitoring candidate  Follows with Dr. Leonie Man as outpt  Hypertension  Elevated up to 191/96 but Stable  Permissive hypertension (OK if < 220/120) but gradually normalize in 5-7 days  Long-term BP goal normotensive  Hyperlipidemia  Home meds:  mevacor 20, resumed in hospital (formulary change to  pravachol)  LDL 72, goal < 70  Continue statin at discharge  Diabetes type II Diabetic retinopathy  HgbA1c 7.5 in March, goal < 7.0  Uncontrolled  SSI  Close follow up with PCP  Other Stroke Risk Factors  Advanced age  Former Cigarette smoker  UDS / ETOH level not performed   Overweight, Body mass index is 29.18 kg/m., recommend weight loss, diet and exercise as appropriate   Coronary artery disease - MI  Other Active Problems  Total blindness bilaterally  Cataracts  CKD  GERD  Constipation  Situational mild anxiety and depressive d/o  Hospital day # 1  Neurology will sign off. Please call with questions. Pt will follow up with Dr. Leonie Man at [facility] in about 8 weeks. Thanks for  the consult.  Rosalin Hawking, MD PhD Stroke Neurology 05/16/2016 6:33 PM   To contact Stroke Continuity provider, please refer to [email or web address] After hours, contact General Neurology

## 2016-05-16 NOTE — Progress Notes (Signed)
OT Cancellation Note  Patient Details Name: Caleb Sao Sr. MRN: [id number] DOB: 03/19/1940   Cancelled Treatment:    Reason Eval/Treat Not Completed: Patient at procedure or test/ unavailable. OT will check back as schedule allows.   [name] 05/16/2016, 11:08 AM  Hulda Humphrey OTR/L [phone]

## 2016-05-16 NOTE — Progress Notes (Signed)
Rehab Admissions Coordinator Note:  Patient was screened by Cleatrice Burke for appropriateness for an Inpatient Acute Rehab Consult per therapy recommendations. Recent d/c from inpt rehab.  At this time, we are recommending Inpatient Rehab consult. Please place order for consult.  Cleatrice Burke 05/16/2016, 8:09 PM  I can be reached at [phone].

## 2016-05-16 NOTE — Clinical Social Work Note (Signed)
CSW was consulted. CSW spoke with pt's son regarding needing 24 hour care for pt at d/c. At this time CSW explained multiple options. CSW explalined PT will work with pt and determine recommendations based off of pt's physical abilities. At that time we can determine if pt will need SNF placement versus home with home health. Pt's son states they would prefer long term care. CSW explained their is a options to get into SNF and transfer to long term. CSW explained the importance of applying for Medicaid in order to pay for long term care. CSW will continue to follow.   [place], [place]

## 2016-05-16 NOTE — Care Management Note (Signed)
Case Management Note  Patient Details  Name: Caleb Keaney Sr. MRN: [id number] Date of Birth: 1940/09/22  Subjective/Objective:   Pt admitted with CVA. He is from a senior apartment.                  Action/Plan: Awaiting PT/OT recommendations. CM following for d/c needs, physician orders.   Expected Discharge Date:                  Expected Discharge Plan:     In-House Referral:     Discharge planning Services     Post Acute Care Choice:    Choice offered to:     DME Arranged:    DME Agency:     HH Arranged:    HH Agency:     Status of Service:  In process, will continue to follow  If discussed at Long Length of Stay Meetings, dates discussed:    Additional Comments:  Pollie Friar, RN 05/16/2016, 10:43 AM

## 2016-05-16 NOTE — Evaluation (Signed)
Occupational Therapy Evaluation Patient Details Name: Caleb Dunn. MRN: [id number] DOB: 1940-02-12 Today's Date: 05/16/2016    History of Present Illness Pt is a 76 y.o. male with medical history significant of HTN, DM type 2, CAD s/p CABG, CVA in 02/2016, and legally blind; who presents with complaints of feeling off balance and not quite himself. Pt was recently dc from [facility]  03/28/16 after L parietal lobe infarct. Latest MRI revealed subacute infarction within the left lateral cerebellar hemisphere.   Clinical Impression   PTA Pt mod I at home after recently returning home from [facility] for ADL and mobility. Pt currently min A for ADL and mobility with SPC. Pt presents with significant change in balance which impacts his ability to function independently in his living environment. Pt currently limited functionally due to the problems listed. (See OT problem list.)   Pt will benefit from OT to maximize function and safety in order to get ready for next venue listed below. Next session to focus on tub transfer.    Follow Up Recommendations  CIR;Supervision/Assistance - 24 hour    Equipment Recommendations  Other (comment) (defer to next venue)    Recommendations for Other Services Rehab consult     Precautions / Restrictions Precautions Precautions: Fall Precaution Comments: History of Falls - multiple recent falls; Pt is blind Restrictions Weight Bearing Restrictions: No      Mobility Bed Mobility               General bed mobility comments: NT, pt in the chair  Transfers Overall transfer level: Needs assistance Equipment used: Straight cane Transfers: Sit to/from Stand Sit to Stand: Min assist         General transfer comment: mild stability assist    Balance Overall balance assessment: Needs assistance Sitting-balance support: Feet supported;No upper extremity supported Sitting balance-Leahy Scale: Fair       Standing balance-Leahy Scale: Fair Standing  balance comment: statically fair, but safer with cane for more than a moment               High Level Balance Comments: truncal movements with eye/head movements added while keeping feet stationary produced instability, but not overt LOB.  Pt need guarding for these tasks.           ADL either performed or assessed with clinical judgement   ADL Overall ADL's : Needs assistance/impaired Eating/Feeding: Set up;Sitting   Grooming: Minimal assistance;Standing;Wash/dry hands Grooming Details (indicate cue type and reason): min A for balance, and vc for location of items due to blindness Upper Body Bathing: Min guard;Sitting   Lower Body Bathing: Min guard;Sitting/lateral leans Lower Body Bathing Details (indicate cue type and reason): Pt has tub chair that he uses at home Upper Body Dressing : Min guard;Sitting   Lower Body Dressing: Minimal assistance;Sit to/from stand Lower Body Dressing Details (indicate cue type and reason): Pt able to don/doff socks by crossing legs Toilet Transfer: Moderate assistance;Ambulation;Regular Toilet;Grab bars;Cueing for safety (SPC (blind))   Toileting- Clothing Manipulation and Hygiene: Minimal assistance;Sit to/from stand   Tub/ Shower Transfer: Tub transfer;Moderate assistance;Cueing for safety;Cueing for sequencing;Ambulation;Shower seat [facility])   Functional mobility during ADLs: Moderate assistance;Cane General ADL Comments: Pt with increased need for balance and assistance navigating environment. Pt with decreased awareness of deficits this session, and stated "I just don't understand why taking care of myself is so hard now"     Vision Baseline Vision/History: Legally blind       Perception  Praxis      Pertinent Vitals/Pain Pain Assessment: 0-10 Pain Score: 2  Pain Location: chronic back pain Pain Descriptors / Indicators: Sore Pain Intervention(s): Monitored during session;Repositioned     Hand Dominance Right    Extremity/Trunk Assessment Upper Extremity Assessment Upper Extremity Assessment: Overall WFL for tasks assessed (no overt coordination/sensation issues on MMT)   Lower Extremity Assessment Lower Extremity Assessment: Defer to PT evaluation   Cervical / Trunk Assessment Cervical / Trunk Assessment: Normal   Communication Communication Communication: No difficulties   Cognition Arousal/Alertness: Awake/alert Behavior During Therapy: WFL for tasks assessed/performed Overall Cognitive Status: Within Functional Limits for tasks assessed                                     General Comments       Exercises     Shoulder Instructions      Home Living Family/patient expects to be discharged to:: Private residence Living Arrangements: Alone Available Help at Discharge: Family;Available PRN/intermittently Type of Home: Other(Comment) (Dunn living Apt) Home Access: Level entry     Home Layout: One level     Bathroom Shower/Tub: Corporate investment banker: Standard Bathroom Accessibility: Yes How Accessible: Accessible via walker Home Equipment: Cane - single point;Shower seat;Hand held shower head;Grab bars - toilet;Grab bars - tub/shower          Prior Functioning/Environment Level of Independence: Needs assistance  Gait / Transfers Assistance Needed: Utilizing cane ADL's / Homemaking Assistance Needed: Daughters assist with financial management, cooking, cleaning, and providing set-up for ADL.   Comments: Enjoys spending time with family and listening to the TV; family takes him out to eat and to the store - enjoys being out in the community        OT Problem List: Decreased activity tolerance;Impaired balance (sitting and/or standing);Decreased safety awareness;Decreased knowledge of use of DME or AE      OT Treatment/Interventions: Self-care/ADL training;Neuromuscular education;Energy conservation;DME and/or AE instruction;Therapeutic  activities;Patient/family education;Balance training    OT Goals(Current goals can be found in the care plan section) Acute Rehab OT Goals Patient Stated Goal: I would really like to get back home. OT Goal Formulation: With patient Time For Goal Achievement: 05/30/16 Potential to Achieve Goals: Good ADL Goals Pt Will Perform Upper Body Bathing: with modified independence;sitting Pt Will Perform Lower Body Bathing: with modified independence;sit to/from stand Pt Will Transfer to Toilet: with modified independence;ambulating;regular height toilet;grab bars Pt Will Perform Toileting - Clothing Manipulation and hygiene: with modified independence;sit to/from stand Pt Will Perform Tub/Shower Transfer: Tub transfer;with modified independence;ambulating;shower seat (SPC) Additional ADL Goal #1: Pt will recall 3 ways of preventing falls in the home to [facility] safety without any verbal cues.  OT Frequency: Min 2X/week   Barriers to D/C: Decreased caregiver support  Pt lives alone - has good family support       Co-evaluation PT/OT/SLP Co-Evaluation/Treatment: Yes Reason for Co-Treatment: Complexity of the patient's impairments (multi-system involvement) PT goals addressed during session: Mobility/safety with mobility OT goals addressed during session: ADL's and self-care      End of Session Equipment Utilized During Treatment: Gait belt Nurse Communication: Mobility status  Activity Tolerance: Patient tolerated treatment well Patient left: in chair;with chair alarm set;with call bell/phone within reach  OT Visit Diagnosis: Unsteadiness on feet (R26.81);Other abnormalities of gait and mobility (R26.89);History of falling (Z91.81);Other symptoms and signs involving the nervous system (R29.898)  Time: 6553-7482 OT Time Calculation (min): 40 min Charges:  OT General Charges $OT Visit: 1 Procedure OT Evaluation $OT Eval Moderate Complexity: 1 Procedure OT Treatments $Self  Care/Home Management : 8-22 mins G-Codes:     Hulda Humphrey OTR/L [place] 05/16/2016, 5:21 PM

## 2016-05-16 NOTE — Evaluation (Signed)
Physical Therapy Evaluation Patient Details Name: Caleb Juhasz Sr. MRN: [id number] DOB: Sep 03, 1940 Today's Date: 05/16/2016   History of Present Illness  Pt is a 76 y.o. male with medical history significant of HTN, DM type 2, CAD s/p CABG, CVA in 02/2016, and legally blind; who presents with complaints of feeling off balance and not quite himself. Pt was recently dc from CIR  03/28/16 after L parietal lobe infarct. Latest MRI revealed subacute infarction within the left lateral cerebellar hemisphere.  Clinical Impression  Pt admitted with/for feeling off balance, MRI showing L lateral cerebellar infarct.  Pt currently limited functionally due to the problems listed. ( See problems list.)   Pt will benefit from PT to maximize function and safety in order to get ready for next venue listed below.     Follow Up Recommendations CIR    Equipment Recommendations       Recommendations for Other Services Rehab consult     Precautions / Restrictions Precautions Precautions: Fall Precaution Comments: History of Falls - multiple recent falls; Pt is blind Restrictions Weight Bearing Restrictions: No      Mobility  Bed Mobility               General bed mobility comments: NT, pt in the chair  Transfers Overall transfer level: Needs assistance   Transfers: Sit to/from Stand Sit to Stand: Min assist         General transfer comment: mild stability assist  Ambulation/Gait Ambulation/Gait assistance: Min assist;Min guard Ambulation Distance (Feet): 100 Feet Assistive device: Straight cane (AND rail/wall) Gait Pattern/deviations: Step-through pattern;Decreased step length - right;Decreased step length - left Gait velocity: slow Gait velocity interpretation: Below normal speed for age/gender General Gait Details: steady with stationary assistive device (rail) with mild dysequilibrium at times.  Mild dysequilibrium most of the time with cane only.  Close guard at all times, min  stability assist with turns or when ambulating too long without grabing a stationary object.  Pt compensates well for his blindness, but can't safely compensate fully for his scewed sense of balance.  Stairs            Wheelchair Mobility    Modified Rankin (Stroke Patients Only) Modified Rankin (Stroke Patients Only) Pre-Morbid Rankin Score: Moderate disability Modified Rankin: Moderately severe disability     Balance Overall balance assessment: Needs assistance Sitting-balance support: Feet supported;No upper extremity supported Sitting balance-Leahy Scale: Fair       Standing balance-Leahy Scale: Fair Standing balance comment: statically fair, but safer with cane for more than a moment               High Level Balance Comments: truncal movements with eye/head movements added while keeping feet stationary produced instability, but not overt LOB.  Pt need guarding for these tasks.             Pertinent Vitals/Pain Pain Assessment: 0-10 Pain Score: 2  Pain Location: chronic back pain Pain Descriptors / Indicators: Sore Pain Intervention(s): Monitored during session    Home Living Family/patient expects to be discharged to:: Private residence Living Arrangements: Alone Available Help at Discharge: Family;Available PRN/intermittently Type of Home: Other(Comment) (Sr living Apt) Home Access: Level entry     Home Layout: One level Home Equipment: Cane - single point;Shower seat;Hand held shower head;Grab bars - toilet;Grab bars - tub/shower      Prior Function Level of Independence: Needs assistance   Gait / Transfers Assistance Needed: Utilizing cane  ADL's / Homemaking Assistance Needed:  Daughters assist with financial management, cooking, cleaning, and providing set-up for ADL.  Comments: Enjoys spending time with family and listening to the TV; family takes him out to eat and to the store - enjoys being out in the community     Hand Dominance    Dominant Hand: Right    Extremity/Trunk Assessment   Upper Extremity Assessment Upper Extremity Assessment: Defer to OT evaluation    Lower Extremity Assessment Lower Extremity Assessment: Overall WFL for tasks assessed (no overt coordination/sensation issues on MMT)       Communication   Communication: No difficulties  Cognition Arousal/Alertness: Awake/alert Behavior During Therapy: WFL for tasks assessed/performed Overall Cognitive Status: Within Functional Limits for tasks assessed                                        General Comments      Exercises     Assessment/Plan    PT Assessment Patient needs continued PT services  PT Problem List Decreased activity tolerance;Decreased balance;Decreased mobility;Decreased coordination;Decreased knowledge of use of DME       PT Treatment Interventions Gait training;Functional mobility training;Therapeutic activities;Balance training;Patient/family education    PT Goals (Current goals can be found in the Care Plan section)  Acute Rehab PT Goals Patient Stated Goal: I would really like to get back home. PT Goal Formulation: With patient Time For Goal Achievement: 05/30/16 Potential to Achieve Goals: Good    Frequency Min 3X/week   Barriers to discharge Decreased caregiver support      Co-evaluation PT/OT/SLP Co-Evaluation/Treatment: Yes Reason for Co-Treatment: Complexity of the patient's impairments (multi-system involvement) PT goals addressed during session: Mobility/safety with mobility OT goals addressed during session: ADL's and self-care       End of Session   Activity Tolerance: Patient tolerated treatment well Patient left: in chair;with call bell/phone within reach;with chair alarm set Nurse Communication: Mobility status PT Visit Diagnosis: Unsteadiness on feet (R26.81);Other abnormalities of gait and mobility (R26.89);Other symptoms and signs involving the nervous system (R29.898)     Time: 5208-0223 PT Time Calculation (min) (ACUTE ONLY): 40 min   Charges:   PT Evaluation $PT Eval Moderate Complexity: 1 Procedure     PT G Codes:        05/22/16  Donnella Sham, PT [phone]  (pager)  Caleb Dunn 22-May-2016, 4:27 PM

## 2016-05-17 ENCOUNTER — Encounter: Payer: Self-pay | Admitting: Neurology

## 2016-05-17 DIAGNOSIS — K5909 Other constipation: Secondary | ICD-10-CM | POA: Diagnosis not present

## 2016-05-17 DIAGNOSIS — F341 Dysthymic disorder: Secondary | ICD-10-CM | POA: Diagnosis not present

## 2016-05-17 DIAGNOSIS — K219 Gastro-esophageal reflux disease without esophagitis: Secondary | ICD-10-CM | POA: Diagnosis not present

## 2016-05-17 DIAGNOSIS — M6281 Muscle weakness (generalized): Secondary | ICD-10-CM | POA: Diagnosis not present

## 2016-05-17 DIAGNOSIS — I119 Hypertensive heart disease without heart failure: Secondary | ICD-10-CM | POA: Diagnosis not present

## 2016-05-17 DIAGNOSIS — R531 Weakness: Secondary | ICD-10-CM | POA: Diagnosis not present

## 2016-05-17 DIAGNOSIS — R296 Repeated falls: Secondary | ICD-10-CM | POA: Diagnosis not present

## 2016-05-17 DIAGNOSIS — I69320 Aphasia following cerebral infarction: Secondary | ICD-10-CM | POA: Diagnosis not present

## 2016-05-17 DIAGNOSIS — I63342 Cerebral infarction due to thrombosis of left cerebellar artery: Secondary | ICD-10-CM | POA: Diagnosis not present

## 2016-05-17 DIAGNOSIS — I69354 Hemiplegia and hemiparesis following cerebral infarction affecting left non-dominant side: Secondary | ICD-10-CM | POA: Diagnosis not present

## 2016-05-17 DIAGNOSIS — N183 Chronic kidney disease, stage 3 (moderate): Secondary | ICD-10-CM | POA: Diagnosis not present

## 2016-05-17 DIAGNOSIS — Z8673 Personal history of transient ischemic attack (TIA), and cerebral infarction without residual deficits: Secondary | ICD-10-CM | POA: Diagnosis not present

## 2016-05-17 DIAGNOSIS — I10 Essential (primary) hypertension: Secondary | ICD-10-CM | POA: Diagnosis not present

## 2016-05-17 DIAGNOSIS — E11311 Type 2 diabetes mellitus with unspecified diabetic retinopathy with macular edema: Secondary | ICD-10-CM | POA: Diagnosis not present

## 2016-05-17 LAB — GLUCOSE, CAPILLARY
GLUCOSE-CAPILLARY: 124 mg/dL — AB (ref 65–99)
Glucose-Capillary: 192 mg/dL — ABNORMAL HIGH (ref 65–99)

## 2016-05-17 LAB — HEMOGLOBIN A1C
HEMOGLOBIN A1C: 8.5 % — AB (ref 4.8–5.6)
MEAN PLASMA GLUCOSE: 197 mg/dL

## 2016-05-17 MED ORDER — CLOPIDOGREL BISULFATE 75 MG PO TABS: 75.0000 mg | ORAL_TABLET | Freq: Every day | ORAL | Status: DC

## 2016-05-17 MED ORDER — INSULIN ASPART 100 UNIT/ML ~~LOC~~ SOLN: 0.0000 [IU] | Freq: Three times a day (TID) | SUBCUTANEOUS | 0 refills | Status: DC

## 2016-05-17 NOTE — Progress Notes (Signed)
Physical Therapy Treatment Patient Details Name: Caleb Gear Sr. MRN: [id number] DOB: December 07, 1940 Today's Date: 05/17/2016    History of Present Illness Pt is a 76 y.o. male with medical history significant of HTN, DM type 2, CAD s/p CABG, CVA in 02/2016, and legally blind; who presents with complaints of feeling off balance and not quite himself. Pt was recently dc from CIR  03/28/16 after L parietal lobe infarct. Latest MRI revealed subacute infarction within the left lateral cerebellar hemisphere.    PT Comments    Improving slowly,  Needs therapy to assist recovery of his sense of balance.  At this time in his recovery, he does not need to be living alone.   Follow Up Recommendations  SNF;Other (comment) (does not need to be alone at this point in recovery)     Equipment Recommendations       Recommendations for Other Services       Precautions / Restrictions Precautions Precautions: Fall Precaution Comments: History of Falls - multiple recent falls; Pt is blind    Mobility  Bed Mobility               General bed mobility comments: NT, pt in the chair  Transfers Overall transfer level: Needs assistance Equipment used: Straight cane Transfers: Sit to/from Stand Sit to Stand: Min guard         General transfer comment: mild stability assist once standing  Ambulation/Gait Ambulation/Gait assistance: Min assist;Min guard Ambulation Distance (Feet): 125 Feet Assistive device: Straight cane (rail) Gait Pattern/deviations: Step-through pattern;Decreased step length - right;Decreased step length - left Gait velocity: slow Gait velocity interpretation: Below normal speed for age/gender General Gait Details: As stated in last note, pt steady with stationary assistive device (rail) with mild dysequilibrium at times.  Mild dysequilibrium most of the time with cane only.  Close guard at all times, min stability assist with turns or when ambulating too long without  grabing a stationary object.  Pt compensates well for his blindness, but can't safely compensate fully for his scewed sense of balance.   Stairs            Wheelchair Mobility    Modified Rankin (Stroke Patients Only) Modified Rankin (Stroke Patients Only) Pre-Morbid Rankin Score: Moderate disability Modified Rankin: Moderately severe disability     Balance Overall balance assessment: Needs assistance Sitting-balance support: Feet supported;No upper extremity supported Sitting balance-Leahy Scale: Fair       Standing balance-Leahy Scale: Fair Standing balance comment: can stand unassisted, more safe with cane                            Cognition Arousal/Alertness: Awake/alert Behavior During Therapy: WFL for tasks assessed/performed Overall Cognitive Status: Within Functional Limits for tasks assessed                                        Exercises      General Comments        Pertinent Vitals/Pain Pain Assessment: No/denies pain    Home Living                      Prior Function            PT Goals (current goals can now be found in the care plan section) Acute Rehab PT Goals Patient Stated Goal: I  would really like to get back home. PT Goal Formulation: With patient Time For Goal Achievement: 05/30/16 Potential to Achieve Goals: Good Progress towards PT goals: Progressing toward goals    Frequency    Min 3X/week      PT Plan Discharge plan needs to be updated    Co-evaluation             End of Session   Activity Tolerance: Patient tolerated treatment well Patient left: in chair;with call bell/phone within reach;with chair alarm set Nurse Communication: Mobility status PT Visit Diagnosis: Unsteadiness on feet (R26.81);Other abnormalities of gait and mobility (R26.89);Other symptoms and signs involving the nervous system (R29.898)     Time: 0375-4360 PT Time Calculation (min) (ACUTE ONLY): 17  min  Charges:  $Gait Training: 8-22 mins                    G Codes:       Jun 12, 2016  Donnella Sham, PT [phone]  (pager)   Tessie Fass Caleb Dunn Jun 12, 2016, 2:16 PM

## 2016-05-17 NOTE — Progress Notes (Signed)
CM and CSW met with the patient and talked to his daughter Caleb Dunn over the phone in his room. Per Caleb Dunn family is interested in having patient faxed out to SNF in [facility] for rehab. She states she is the best contact person and will notify the rest of her family on the bed choices. CSW aware. CM following.

## 2016-05-17 NOTE — Clinical Social Work Note (Signed)
Clinical Social Work Assessment  Patient Details  Name: Caleb Certain Sr. MRN: [id number] Date of Birth: 07-24-1940  Date of referral:  05/16/16               Reason for consult:  Facility Placement                Permission sought to share information with:  Family Supports Permission granted to share information::     Name::     Caleb Dunn  Agency::     Relationship::  Son  Sport and exercise psychologist Information:     Housing/Transportation Living arrangements for the past 2 months:  [place] of Information:  Adult Children Patient Interpreter Needed:  None Criminal Activity/Legal Involvement Pertinent to Current Situation/Hospitalization:  No - Comment as needed Significant Relationships:  Adult Children Lives with:  Self Do you feel safe going back to the place where you live?    Need for family participation in patient care:     Care giving concerns:  Pt was not in the room at time of assessment. Pt's son had requested to speak to CSW.   Social Worker assessment / plan:  CSW spoke with pt's son at bedside. Pt's son was concerned because pt will need 24 hour care after discharge. CSW and RNCM exaplained the multiple options. CSW explained the SNF option. CSW prepared brother for SNF backup in the case that CIR couldn't take pt. On 4/26 Pt's daughter agreeable to [facility] f/o. CSW will present bed offers once available.   Employment status:  Retired Nurse, adult PT Recommendations:  Inpatient [facility] / Referral to community resources:  [place]  Patient/Family's Response to care:  Pt's son verbalized understanding of CSW role and expressed appreciation for support. Pt's son denies any concern regarding pt care at this time.   Patient/Family's Understanding of and Emotional Response to Diagnosis, Current Treatment, and Prognosis:  Pt's spn understanding and realistic regarding physical limitations. Pt's son understands  the need for SNF placement at d/c. Pt's son agreeable to SNF placement at d/c, at this time. Pt's son denies any concern regarding treatment plan at this time. CSW will continue to provide support and facilitate d/c needs.   Emotional Assessment Appearance:  Appears stated age Attitude/Demeanor/Rapport:  Unable to Assess Affect (typically observed):  Unable to Assess Orientation:  Oriented to Self, Oriented to Place, Oriented to  Time, Oriented to Situation Alcohol / Substance use:  Not Applicable Psych involvement (Current and /or in the community):  No (Comment)  Discharge Needs  Concerns to be addressed:  No discharge needs identified Readmission within the last 30 days:  No Current discharge risk:  Dependent with Mobility, Lives alone Barriers to Discharge:  Continued Medical Work up, [employer], LCSW 05/17/2016, 11:13 AM

## 2016-05-17 NOTE — NC FL2 (Signed)
[place] LEVEL OF CARE SCREENING TOOL     IDENTIFICATION  Patient Name: Caleb Dunn. Birthdate: 1940/05/18 Sex: male Admission Date (Current Location): 05/15/2016  [facility] and [place] Number:  Herbalist and Address:  [facility], [place] [address] [place], [place] [address]      Provider Number: [id number]  Attending Physician Name and Address:  Rosita Fire, MD  Relative Name and Phone Number:       Current Level of Care: Hospital Recommended Level of Care: [place] Prior Approval Number:    Date Approved/Denied:   PASRR Number: [id number]  Discharge Plan: SNF    Current Diagnoses: Patient Active Problem List   Diagnosis Date Noted  . Fall   . History of stroke   . Stroke ([place]) 05/15/2016  . AKI (acute kidney injury) ([place])   . Type 2 diabetes mellitus with peripheral neuropathy (HCC)   . Labile blood pressure   . Stage 3 chronic kidney disease   . Essential hypertension   . Blind   . 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 Acute cerebrovascular accident (CVA) ([place]) 03/13/2016  . Pruritus 01/03/2016  . UTI (urinary tract infection) 10/27/2015  . Impacted cerumen of left ear 10/03/2015  . Sore throat in the morning 09/23/2015  . Situational mixed anxiety and depressive disorder 06/17/2015  . Neuropathic pain 03/09/2015  . Insomnia 03/09/2015  . Left arm weakness 08/13/2014  . Herpes zoster 07/13/2014  . Constipation 07/13/2014  . Epididymitis 02/23/2014  . Pain in joint, ankle and foot 08/04/2013  . Dry mouth 05/05/2013  . 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 Dizziness 11/01/2011  . Obesity (BMI 30-39.9)   . Chronic kidney disease (CKD), stage IV (severe) ([place])   . Elevated PSA 08/17/2011  . Bladder neck obstruction 04/13/2011  . DM (diabetes mellitus), type 2 with ophthalmic complications ([place])   . Glaucoma associated with ocular disorder   . Carpal tunnel sundrome    . Erectile dysfunction   . GERD   . Cervical disc disorder with radiculopathy   . Hyperlipidemia   . Cholelithiases   . Hypertensive heart disease   . Coronary atherosclerosis     Orientation RESPIRATION BLADDER Height & Weight     Self, Time, Situation, Place  Normal Continent Weight: 191 lb 14.4 oz (87 kg) Height:  5\' 8"  (172.7 cm)  BEHAVIORAL SYMPTOMS/MOOD NEUROLOGICAL BOWEL NUTRITION STATUS      Continent  (Please see d/c summary)  AMBULATORY STATUS COMMUNICATION OF NEEDS Skin   Limited Assist Verbally Normal                       Personal Care Assistance Level of Assistance  Bathing, Feeding, Dressing Bathing Assistance: Limited assistance Feeding assistance: Independent Dressing Assistance: Limited assistance     Functional Limitations Info  Sight, Hearing, Speech Sight Info: Impaired (Blind) Hearing Info: Adequate Speech Info: Adequate    SPECIAL CARE FACTORS FREQUENCY  PT (By licensed PT), OT (By licensed OT)     PT Frequency: min 3x week OT Frequency: min 3x week            Contractures Contractures Info: Not present    Additional Factors Info  Code Status, Allergies Code Status Info: Full Code Allergies Info: Hydroxyzine, Verapamil, Benazepril Hcl           Current Medications (05/17/2016):  This is the current hospital active medication list Current Facility-Administered Medications  Medication Dose Route Frequency Provider Last Rate Last Dose  .  stroke: mapping our early stages of recovery book   Does not apply Once Norval Morton, MD      . 0.9 %  sodium chloride infusion   Intravenous Continuous Norval Morton, MD 75 mL/hr at 05/15/16 2052 75 mL/hr at 05/15/16 2052  . acetaminophen (TYLENOL) tablet 650 mg  650 mg Oral Q4H PRN Norval Morton, MD       Or  . acetaminophen (TYLENOL) solution 650 mg  650 mg Per Tube Q4H PRN Norval Morton, MD       Or  . acetaminophen (TYLENOL) suppository 650 mg  650 mg Rectal Q4H PRN Norval Morton, MD      . aspirin EC tablet 325 mg  325 mg Oral Daily Norval Morton, MD   325 mg at 05/17/16 1057  . cetaphil (CETAPHIL) lotion   Topical PRN Norval Morton, MD      . clopidogrel (PLAVIX) tablet 75 mg  75 mg Oral Daily Dron Tanna Furry, MD   75 mg at 05/17/16 1057  . enoxaparin (LOVENOX) injection 40 mg  40 mg Subcutaneous QHS Norval Morton, MD   40 mg at 05/16/16 2238  . finasteride (PROSCAR) tablet 5 mg  5 mg Oral Daily Norval Morton, MD   5 mg at 05/17/16 1057  . FLUoxetine (PROZAC) capsule 20 mg  20 mg Oral Daily Rondell A Tamala Julian, MD   20 mg at 05/17/16 1057  . hydrALAZINE (APRESOLINE) tablet 10 mg  10 mg Oral Q8H Rondell Charmayne Sheer, MD   10 mg at 05/17/16 0538  . insulin aspart (novoLOG) injection 0-5 Units  0-5 Units Subcutaneous QHS Rondell A Smith, MD      . insulin aspart (novoLOG) injection 0-9 Units  0-9 Units Subcutaneous TID WC Norval Morton, MD   1 Units at 05/17/16 [phone]  . linaclotide (LINZESS) capsule 290 mcg  290 mcg Oral QAC breakfast Norval Morton, MD   290 mcg at 05/17/16 0538  . losartan (COZAAR) tablet 100 mg  100 mg Oral QHS Norval Morton, MD   100 mg at 05/16/16 2238  . metoprolol succinate (TOPROL-XL) 24 hr tablet 75 mg  75 mg Oral Daily Norval Morton, MD   75 mg at 05/17/16 1057  . pantoprazole (PROTONIX) EC tablet 40 mg  40 mg Oral Daily Norval Morton, MD   40 mg at 05/17/16 1057  . polyethylene glycol (MIRALAX / GLYCOLAX) packet 17 g  17 g Oral Daily PRN Norval Morton, MD      . pravastatin (PRAVACHOL) tablet 20 mg  20 mg Oral q1800 Norval Morton, MD   20 mg at 05/16/16 1721  . senna-docusate (Senokot-S) tablet 1 tablet  1 tablet Oral QHS PRN Norval Morton, MD         Discharge Medications: Please see discharge summary for a list of discharge medications.  Relevant Imaging Results:  Relevant Lab Results:   Additional Information SSN: [id number]  Eileen Stanford, LCSW

## 2016-05-17 NOTE — Clinical Social Work Note (Signed)
At this time is stating pt does not have someone with him 24/7. Family wants to pursue SNF placement. Pt's daughter agreeable to [facility] f/o. CSW will contact daughter once bed offers available.  [place], [place]

## 2016-05-17 NOTE — Consult Note (Signed)
Physical Medicine and Rehabilitation Consult   Reason for Consult: Balance deficits Referring Physician: Dr. Carolin Sicks   HPI: Caleb Nim Sr. is a 76 y.o. male with history of T2DM with peripheral neuropathy, CKD, blindness, CAD, recent parietal infarct with [facility] 02/2016 who was discharged to home after CIR stay 3/7 with recommendations for 24 hours supervision.  He was readmitted on 05/15/16 with reports of being off balance, dizziness, multiple falls and increasing confusion with hallucinations. He was found to have acute/early subacute infarct in left lateral cerebellar hemisphere with stable chronic vascular changes. MRA brain negative for large vessel occlusion. CT C-spine with moderate to severe C4/5 foraminal narrowing. Dr. Erlinda Hong recommended DAPT for 3 weeks then plavix alone per CHANCE trial. PT/OT evaluations done yesterday revealing poor safety awareness with decrease awareness of deficits, decrease in balance and mild dysequilibrium. CIR recommended by rehab team.    Review of Systems  HENT: Negative for ear pain, hearing loss and tinnitus.   Eyes:       Blind for 8 years  Respiratory: Negative for cough and shortness of breath.   Cardiovascular: Positive for chest pain (on and off for years).  Gastrointestinal: Negative for constipation, heartburn and nausea.  Genitourinary: Negative for dysuria and urgency.  Musculoskeletal: Positive for back pain (chronic --worse due to falls), falls (due to ambulating independently) and myalgias.  Skin: Negative for itching and rash.  Neurological: Negative for dizziness, focal weakness and headaches.  Psychiatric/Behavioral: Negative for depression. The patient is not nervous/anxious and does not have insomnia.       Past Medical History:  Diagnosis Date  . Blindness   . CAD (coronary artery disease)   . Cholelithiasis   . CTS (carpal tunnel syndrome)    Left  . DM type 2 with diabetic peripheral neuropathy ([place])   . ED  (erectile dysfunction)   . Elevated PSA   . GERD (gastroesophageal reflux disease)   . Glaucoma   . HTN (hypertension)   . 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 MI (myocardial infarction) ([place]) 1991  . Thyroid nodule   . Type II or unspecified type diabetes mellitus without mention of complication, not stated as uncontrolled     Past Surgical History:  Procedure Laterality Date  . CHOLECYSTECTOMY      Family History  Problem Relation Age of Onset  . Hypertension Mother   . Kidney disease Father   . Hypertension Father   . Coronary artery disease      1st degree Male relative <60  . Diabetes      1st degree relative    Social History:   Lives in an independent living. Son works but family checks in during the day. He uses cane "off and on" and furniture walks. Retired from [employer]. He   reports that he has quit smoking in 1991./  He has never used smokeless tobacco. He reports that he does not drink alcohol or use drugs.    Allergies  Allergen Reactions  . Hydroxyzine Other (See Comments)    Visual hallucinations  . Verapamil Other (See Comments)    Hallucinations   . Benazepril Hcl Cough    Medications Prior to Admission  Medication Sig Dispense Refill  . aspirin EC 325 MG EC tablet Take 1 tablet (325 mg total) by mouth daily. 30 tablet 0  . cholecalciferol (VITAMIN D) 1000 units tablet Take 1 tablet (1,000 Units total) by mouth daily. 100 tablet 3  .  Emollient (EUCERIN) lotion Apply topically as needed for dry skin. Use bid 480 mL 3  . finasteride (PROSCAR) 5 MG tablet Take 1 tablet (5 mg total) by mouth daily. For Prostate 90 tablet 3  . FLUoxetine (PROZAC) 20 MG tablet Take 1 tablet (20 mg total) by mouth daily. 30 tablet 5  . hydrALAZINE (APRESOLINE) 10 MG tablet Take 1 tablet (10 mg total) by mouth every 8 (eight) hours. 90 tablet 1  . linaclotide (LINZESS) 290 MCG CAPS capsule Take 1 capsule (290 mcg total) by mouth daily. 30 capsule 1  .  linagliptin (TRADJENTA) 5 MG TABS tablet Take 1 tablet (5 mg total) by mouth daily. 30 tablet 1  . losartan (COZAAR) 100 MG tablet Take 1 tablet (100 mg total) by mouth at bedtime. 30 tablet 1  . lovastatin (MEVACOR) 20 MG tablet Take 1 tablet (20 mg total) by mouth daily. For Cholesterol 90 tablet 3  . metoprolol succinate (TOPROL-XL) 25 MG 24 hr tablet Take 3 tablets (75 mg total) by mouth daily. Take with or immediately following a meal. 30 tablet 1  . metoprolol succinate (TOPROL-XL) 50 MG 24 hr tablet Take 50 mg by mouth daily. Take with or immediately following a meal.    . pantoprazole (PROTONIX) 40 MG tablet Take 1 tablet (40 mg total) by mouth daily. 30 tablet 0  . polyethylene glycol (MIRALAX / GLYCOLAX) packet Take 17 g by mouth daily. (Patient taking differently: Take 17 g by mouth daily as needed for moderate constipation. ) 30 each 5  . sitaGLIPtin (JANUVIA) 50 MG tablet Take 50 mg by mouth daily.      Home: Home Living Family/patient expects to be discharged to:: Private residence Living Arrangements: Alone Available Help at Discharge: Family, Available PRN/intermittently Type of Home: Other(Comment) (Sr living Apt) Home Access: Level entry Home Layout: One level Bathroom Shower/Tub: Tub/shower unit, Architectural technologist: Standard Bathroom Accessibility: Yes Home Equipment: Cane - single point, Shower seat, Hand held shower head, Grab bars - toilet, Grab bars - tub/shower  Functional History: Prior Function Level of Independence: Needs assistance Gait / Transfers Assistance Needed: Utilizing cane ADL's / Homemaking Assistance Needed: Daughters assist with financial management, cooking, cleaning, and providing set-up for ADL. Comments: Enjoys spending time with family and listening to the TV; family takes him out to eat and to the store - enjoys being out in the community Functional Status:  Mobility: Bed Mobility General bed mobility comments: NT, pt in the  chair Transfers Overall transfer level: Needs assistance Equipment used: Straight cane Transfers: Sit to/from Stand Sit to Stand: Min assist General transfer comment: mild stability assist Ambulation/Gait Ambulation/Gait assistance: Min assist, Min guard Ambulation Distance (Feet): 100 Feet Assistive device: Straight cane (AND rail/wall) Gait Pattern/deviations: Step-through pattern, Decreased step length - right, Decreased step length - left General Gait Details: steady with stationary assistive device (rail) with mild dysequilibrium at times.  Mild dysequilibrium most of the time with cane only.  Close guard at all times, min stability assist with turns or when ambulating too long without grabing a stationary object.  Pt compensates well for his blindness, but can't safely compensate fully for his scewed sense of balance. Gait velocity: slow Gait velocity interpretation: Below normal speed for age/gender    ADL: ADL Overall ADL's : Needs assistance/impaired Eating/Feeding: Set up, Sitting Grooming: Minimal assistance, Standing, Wash/dry hands Grooming Details (indicate cue type and reason): min A for balance, and vc for location of items due to blindness Upper Body  Bathing: Min guard, Sitting Lower Body Bathing: Min guard, Sitting/lateral leans Lower Body Bathing Details (indicate cue type and reason): Pt has tub chair that he uses at home Upper Body Dressing : Min guard, Sitting Lower Body Dressing: Minimal assistance, Sit to/from stand Lower Body Dressing Details (indicate cue type and reason): Pt able to don/doff socks by crossing legs Toilet Transfer: Moderate assistance, Ambulation, Regular Toilet, Grab bars, Cueing for safety (SPC (blind)) Toileting- Clothing Manipulation and Hygiene: Minimal assistance, Sit to/from stand Tub/ Shower Transfer: Tub transfer, Moderate assistance, Cueing for safety, Cueing for sequencing, Ambulation, Shower seat [facility]) Functional mobility during  ADLs: Moderate assistance, Cane General ADL Comments: Pt with increased need for balance and assistance navigating environment. Pt with decreased awareness of deficits this session, and stated "I just don't understand why taking care of myself is so hard now"  Cognition: Cognition Overall Cognitive Status: Within Functional Limits for tasks assessed Orientation Level: Oriented X4 Cognition Arousal/Alertness: Awake/alert Behavior During Therapy: WFL for tasks assessed/performed Overall Cognitive Status: Within Functional Limits for tasks assessed   Blood pressure (!) 143/57, pulse 63, temperature 97.5 F (36.4 C), temperature source Oral, resp. rate 18, height 5\' 8"  (1.727 m), weight 87 kg (191 lb 14.4 oz), SpO2 100 %. Physical Exam  Nursing note and vitals reviewed. Constitutional: He is oriented to person, place, and time. He appears well-developed and well-nourished.  HENT:  Head: Normocephalic and atraumatic.  Mouth/Throat: Oropharynx is clear and moist.  Neck: Normal range of motion. Neck supple.  Cardiovascular: Normal rate and regular rhythm.   Respiratory: Effort normal and breath sounds normal. No respiratory distress. He has no wheezes.  GI: Soft. Bowel sounds are normal. He exhibits no distension. There is no tenderness.  Musculoskeletal: He exhibits no edema or tenderness.  Neurological: He is alert and oriented to person, place, and time.  Able to follow simple motor commands without difficulty.    Skin: Skin is warm and dry.    Results for orders placed or performed during the hospital encounter of 05/15/16 (from the past 24 hour(s))  Hemoglobin A1c     Status: Abnormal   Collection Time: 05/16/16  8:50 AM  Result Value Ref Range   Hgb A1c MFr Bld 8.5 (H) 4.8 - 5.6 %   Mean Plasma Glucose 197 mg/dL  Lipid panel     Status: Abnormal   Collection Time: 05/16/16  8:50 AM  Result Value Ref Range   Cholesterol 131 0 - 200 mg/dL   Triglycerides 117 <150 mg/dL   HDL 36  (L) >40 mg/dL   Total CHOL/HDL Ratio 3.6 RATIO   VLDL 23 0 - 40 mg/dL   LDL Cholesterol 72 0 - 99 mg/dL  CBC with Differential/Platelet     Status: Abnormal   Collection Time: 05/16/16  8:50 AM  Result Value Ref Range   WBC 5.9 4.0 - 10.5 K/uL   RBC 4.71 4.22 - 5.81 MIL/uL   Hemoglobin 12.4 (L) 13.0 - 17.0 g/dL   HCT 38.4 (L) 39.0 - 52.0 %   MCV 81.5 78.0 - 100.0 fL   MCH 26.3 26.0 - 34.0 pg   MCHC 32.3 30.0 - 36.0 g/dL   RDW 13.9 11.5 - 15.5 %   Platelets 170 150 - 400 K/uL   Neutrophils Relative % 55 %   Neutro Abs 3.3 1.7 - 7.7 K/uL   Lymphocytes Relative 36 %   Lymphs Abs 2.1 0.7 - 4.0 K/uL   Monocytes Relative 7 %  Monocytes Absolute 0.4 0.1 - 1.0 K/uL   Eosinophils Relative 2 %   Eosinophils Absolute 0.1 0.0 - 0.7 K/uL   Basophils Relative 0 %   Basophils Absolute 0.0 0.0 - 0.1 K/uL  Basic metabolic panel     Status: Abnormal   Collection Time: 05/16/16  8:50 AM  Result Value Ref Range   Sodium 138 135 - 145 mmol/L   Potassium 4.2 3.5 - 5.1 mmol/L   Chloride 106 101 - 111 mmol/L   CO2 25 22 - 32 mmol/L   Glucose, Bld 177 (H) 65 - 99 mg/dL   BUN 22 (H) 6 - 20 mg/dL   Creatinine, Ser 1.86 (H) 0.61 - 1.24 mg/dL   Calcium 9.0 8.9 - 10.3 mg/dL   GFR calc non Af Amer 34 (L) >60 mL/min   GFR calc Af Amer 39 (L) >60 mL/min   Anion gap 7 5 - 15  Glucose, capillary     Status: Abnormal   Collection Time: 05/16/16 12:04 PM  Result Value Ref Range   Glucose-Capillary 114 (H) 65 - 99 mg/dL  Glucose, capillary     Status: Abnormal   Collection Time: 05/16/16  5:05 PM  Result Value Ref Range   Glucose-Capillary 184 (H) 65 - 99 mg/dL  Glucose, capillary     Status: Abnormal   Collection Time: 05/16/16  9:33 PM  Result Value Ref Range   Glucose-Capillary 152 (H) 65 - 99 mg/dL   Comment 1 Notify RN    Comment 2 Document in Chart   Glucose, capillary     Status: Abnormal   Collection Time: 05/17/16  6:12 AM  Result Value Ref Range   Glucose-Capillary 124 (H) 65 - 99 mg/dL    Comment 1 Notify RN    Comment 2 Document in Chart    Dg Chest 2 View  Result Date: 05/15/2016 CLINICAL DATA:  Gait imbalance beginning last night. EXAM: CHEST  2 VIEW COMPARISON:  Chest radiograph March 21, 2016 FINDINGS: Cardiac silhouette is upper limits of normal in size, status post median sternotomy for CABG. No pleural effusion or focal consolidation. No pneumothorax. Soft tissue planes and included osseous structures are nonsuspicious. IMPRESSION: Stable examination: Borderline cardiomegaly, no acute pulmonary process. Electronically Signed   By: Elon Alas M.D.   On: 05/15/2016 14:58   Ct Head Wo Contrast  Result Date: 05/15/2016 CLINICAL DATA:  Gait imbalance beginning last night. Unwitnessed fall 2 days ago. History of diabetes, hypertension and stroke. EXAM: CT HEAD WITHOUT CONTRAST CT CERVICAL SPINE WITHOUT CONTRAST TECHNIQUE: Multidetector CT imaging of the head and cervical spine was performed following the standard protocol without intravenous contrast. Multiplanar CT image reconstructions of the cervical spine were also generated. COMPARISON:  MRI of the head March 13, 2016 FINDINGS: CT HEAD FINDINGS BRAIN: No intraparenchymal hemorrhage, mass effect nor midline shift. Old LEFT basal ganglia lacunar infarct with mild ex vacuo dilatation of the frontal horn of the lateral ventricles. Moderate to severe ventriculomegaly on the basis of global parenchymal brain volume loss. Old LEFT thalamus lacunar infarct. Small old LEFT cerebellar infarcts. Patchy supratentorial white matter hypodensities within normal range for patient's age, though non-specific are most compatible with chronic small vessel ischemic disease. No acute large vascular territory infarcts. No abnormal extra-axial fluid collections. Basal cisterns are patent. VASCULAR: Moderate calcific atherosclerosis of the carotid siphons. SKULL: No skull fracture. No significant scalp soft tissue swelling. Small nodule RIGHT  frontal scalp, recommend direct inspection. SINUSES/ORBITS: The mastoid air-cells and  included paranasal sinuses are well-aerated. LEFT frontal osteoma. The included ocular globes and orbital contents are non-suspicious. Status post bilateral ocular lens implants with RIGHT glaucoma drainage device. OTHER: None. CT CERVICAL SPINE FINDINGS ALIGNMENT: Straightened lordosis. Vertebral bodies in alignment. SKULL BASE AND VERTEBRAE: Cervical vertebral bodies and posterior elements are intact. Moderate to severe C4-5 and C5-6 disc height loss with endplate spurring and uncovertebral hypertrophy compatible with degenerative discs. C1-2 articulation maintained with moderate arthropathy. Borderline congenital canal narrowing on the basis of foreshortened pedicles. SOFT TISSUES AND SPINAL CANAL: Nonacute. Moderate calcific atherosclerosis carotid bifurcations. DISC LEVELS: Moderate RIGHT C3-4, moderate to severe C4-5, moderate C5-6 neural foraminal narrowing. UPPER CHEST: Lung apices are clear. OTHER: None. IMPRESSION: CT HEAD: No acute intracranial process. Stable examination including moderate to severe global parenchymal brain volume loss and multiple old small infarcts. CT CERVICAL SPINE: No acute fracture or malalignment. Moderate to severe C4-5 neural foraminal narrowing. Electronically Signed   By: Elon Alas M.D.   On: 05/15/2016 15:07   Ct Cervical Spine Wo Contrast  Result Date: 05/15/2016 CLINICAL DATA:  Gait imbalance beginning last night. Unwitnessed fall 2 days ago. History of diabetes, hypertension and stroke. EXAM: CT HEAD WITHOUT CONTRAST CT CERVICAL SPINE WITHOUT CONTRAST TECHNIQUE: Multidetector CT imaging of the head and cervical spine was performed following the standard protocol without intravenous contrast. Multiplanar CT image reconstructions of the cervical spine were also generated. COMPARISON:  MRI of the head March 13, 2016 FINDINGS: CT HEAD FINDINGS BRAIN: No intraparenchymal  hemorrhage, mass effect nor midline shift. Old LEFT basal ganglia lacunar infarct with mild ex vacuo dilatation of the frontal horn of the lateral ventricles. Moderate to severe ventriculomegaly on the basis of global parenchymal brain volume loss. Old LEFT thalamus lacunar infarct. Small old LEFT cerebellar infarcts. Patchy supratentorial white matter hypodensities within normal range for patient's age, though non-specific are most compatible with chronic small vessel ischemic disease. No acute large vascular territory infarcts. No abnormal extra-axial fluid collections. Basal cisterns are patent. VASCULAR: Moderate calcific atherosclerosis of the carotid siphons. SKULL: No skull fracture. No significant scalp soft tissue swelling. Small nodule RIGHT frontal scalp, recommend direct inspection. SINUSES/ORBITS: The mastoid air-cells and included paranasal sinuses are well-aerated. LEFT frontal osteoma. The included ocular globes and orbital contents are non-suspicious. Status post bilateral ocular lens implants with RIGHT glaucoma drainage device. OTHER: None. CT CERVICAL SPINE FINDINGS ALIGNMENT: Straightened lordosis. Vertebral bodies in alignment. SKULL BASE AND VERTEBRAE: Cervical vertebral bodies and posterior elements are intact. Moderate to severe C4-5 and C5-6 disc height loss with endplate spurring and uncovertebral hypertrophy compatible with degenerative discs. C1-2 articulation maintained with moderate arthropathy. Borderline congenital canal narrowing on the basis of foreshortened pedicles. SOFT TISSUES AND SPINAL CANAL: Nonacute. Moderate calcific atherosclerosis carotid bifurcations. DISC LEVELS: Moderate RIGHT C3-4, moderate to severe C4-5, moderate C5-6 neural foraminal narrowing. UPPER CHEST: Lung apices are clear. OTHER: None. IMPRESSION: CT HEAD: No acute intracranial process. Stable examination including moderate to severe global parenchymal brain volume loss and multiple old small infarcts. CT  CERVICAL SPINE: No acute fracture or malalignment. Moderate to severe C4-5 neural foraminal narrowing. Electronically Signed   By: Elon Alas M.D.   On: 05/15/2016 15:07   [name] Head Wo Contrast  Result Date: 05/16/2016 CLINICAL DATA:  Acute presentation with balance disturbance and left cerebellar infarction. EXAM: MRA HEAD WITHOUT CONTRAST TECHNIQUE: Angiographic images of the Circle of Willis were obtained using MRA technique without intravenous contrast. COMPARISON:  05/15/2016 FINDINGS: Both internal  carotid arteries are widely patent into the brain. The anterior and middle cerebral vessels are normal without proximal stenosis, aneurysm or vascular malformation. Both vertebral arteries are widely patent to the basilar. No basilar stenosis. Dominant PICA, the origin of which is not included in the region. Anterior inferior cerebellar arteries patent bilaterally, larger on the right. Superior cerebellar and posterior cerebral arteries patent and normal. IMPRESSION: Within normal limits. No large or medium vessel stenosis or occlusion. Electronically Signed   By: Nelson Chimes M.D.   On: 05/16/2016 11:37   Mr Brain Wo Contrast  Result Date: 05/15/2016 CLINICAL DATA:  Unwitnessed fall in new subjective balance deficits. EXAM: MRI HEAD WITHOUT CONTRAST TECHNIQUE: Multiplanar, multiecho pulse sequences of the brain and surrounding structures were obtained without intravenous contrast. COMPARISON:  03/13/2016 MRI of the head. FINDINGS: Brain: Subcentimeter focus of diffusion restriction within the lateral left cerebellar hemisphere compatible with acute/ early subacute infarction. No additional diffusion signal abnormality of the brain. Stable foci of T2 FLAIR hyperintense signal abnormality within subcortical and periventricular white matter as well as the pons compatible with chronic microvascular ischemic changes. Stable brain parenchymal volume loss. Stable small chronic lacunar infarctions within  the left thalamus and left cerebellar hemisphere. No new susceptibility hypointensity to indicate intracranial hemorrhage. Ventricle size is commensurate to the degree of volume loss. No focal mass effect. No extra-axial collection. No effacement of basilar cisterns. Vascular: Normal flow voids. Skull and upper cervical spine: Normal marrow signal. Sinuses/Orbits: Negative. Other: None. IMPRESSION: 1. Subcentimeter acute/ early subacute infarction within the left lateral cerebellar hemisphere. No acute hemorrhage identified. 2. Stable chronic microvascular ischemic changes and parenchymal volume loss of the brain. These results were called by telephone at the time of interpretation on 05/15/2016 at 6:38 pm to [place], who verbally acknowledged these results. Electronically Signed   By: Kristine Garbe M.D.   On: 05/15/2016 18:41    Assessment/Plan: Diagnosis: left cerebellar infarct 1. Does the need for close, 24 hr/day medical supervision in concert with the patient's rehab needs make it unreasonable for this patient to be served in a less intensive setting? No 2. Co-Morbidities requiring supervision/potential complications:   3. Due to bladder management, bowel management and safety, does the patient require 24 hr/day rehab nursing? No 4. Does the patient require coordinated care of a physician, rehab nurse, na to address physical and functional deficits in the context of the above medical diagnosis(es)? No Addressing deficits in the following areas:   5. Can the patient actively participate in an intensive therapy program of at least 3 hrs of therapy per day at least 5 days per week? Potentially 6. The potential for patient to make measurable gains while on inpatient rehab is fair 7. Anticipated functional outcomes upon discharge from inpatient rehab are n/a  with PT, n/a with OT, n/a with SLP. 8. Estimated rehab length of stay to reach the above functional goals is: n/a 9. Does the  patient have adequate social supports and living environment to accommodate these discharge functional goals? N/A 10. Anticipated D/C setting: Home 11. Anticipated post D/C treatments: [place] therapy 12. Overall Rehab/Functional Prognosis: fair  RECOMMENDATIONS: This patient's condition is appropriate for continued rehabilitative care in the following setting: [facility] Therapy Patient has agreed to participate in recommended program. N/A Note that insurance prior authorization may be required for reimbursement for recommended care.  Comment: Pt was discharged home last month at supervision level. Already at min guard assist level hospital day 3. Can't justify readmission  to [place], MD, [place] [facility] 05/17/2016    Bary Leriche, Hershal Coria 05/17/2016

## 2016-05-17 NOTE — Progress Notes (Signed)
Occupational Therapy; Discharge Recommendation Update:  Pt not seen for a treatment session today, however after talking with case management and Physical Therapist, changing discharge recommendation to SNF because family is not able to have 24 hour care upon dc. Please feel free to contact me with any questions or concerns. Pt continues to require SNF level therapy upon dc.     05/17/16 1300  OT Assessment/Plan  OT Plan Discharge plan needs to be updated  OT Frequency (ACUTE ONLY) Min 2X/week  Follow Up Recommendations SNF;Supervision/Assistance - 24 hour  OT Equipment Other (comment) (defer to next venue)    Hulda Humphrey OTR/L [phone]

## 2016-05-17 NOTE — Progress Notes (Signed)
Report called to [place] at [facility]. Pt. Discharged via wheelchair with family.

## 2016-05-17 NOTE — Progress Notes (Signed)
Noted per Dr. Naaman Plummer of [facility] with supervision assist of family. Noted 24/7 supervision level is not available therefore SNF is dispo. We will sign off. [phone]

## 2016-05-17 NOTE — Discharge Summary (Addendum)
Physician Discharge Summary  Caleb Greenup Sr. UXN:[id number] DOB: [birth year] DOA: 05/15/2016  PCP: Walker Kehr, MD  Admit date: 05/15/2016 Discharge date: 05/17/2016  Admitted From:Home Disposition:SNF  Recommendations for Outpatient Follow-up:  1. Follow up with PCP in 1-2 weeks 2. Please obtain BMP/CBC in one week 3. As per Neurologist, please continue aspirin and plavix for 3 weeks and then plavix only.  Home Health:SNF Equipment/Devices:none Discharge Condition:stable CODE STATUS:full Diet recommendation:carb modified heart healthy diet  Brief/Interim Summary: 76 y.o.malewith medical history significant of HTN, DM type 2, CAD s/p CABG, CVA in 02/2016, and legally blind; who presents with complaints of feeling off balance and not quite himself.MRI of the brain reveals signs of acute/subacute stroke of the left lateral cerebellar hemisphere. Neurology was consulted.  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 -MRA unremarkable -Patient had echocardiogram on 2/21 showed normal LV size with EF of 55% -Also had vascular Doppler ultrasound of the intracranial vessels on 2/23 -Added Plavix, continue aspirin and a statin. Neurology recommended asa and plavix for 3 weeks and the plavix only. -PT, OT and social worker evaluated The patient and discharge him to skilled nursing facility. -LDL 72 -Weakness is improved.  #Falls: Continue physical therapy and supportive care  #Type 2 diabetes: Monitor blood sugar level. A1c elevated to 8.5. Resume home oral medication and he started insulin sliding scale. He recommended to follow-up with PCP. Patient may need adjustment of insulin depending on the blood sugar level.  #Chronic kidney disease is stage III: Creatinine level around baseline. Avoid nephrotoxins  #Essential hypertension: Continue hydralazine, losartan and metoprolol.  Monitor blood pressure.  #Hyperlipidemia: Continue pravastatin. LDL acceptable  Patient is clinically stable. Discharge and to skilled nursing facility.  Discharge Diagnoses:  Principal Problem:   Stroke [facility]) Active Problems:   Coronary atherosclerosis   GERD   DM (diabetes mellitus), type 2 with ophthalmic complications ([place])   Sherran Needs syndrome   Stage 3 chronic kidney disease   Essential hypertension   Fall   History of stroke    Discharge Instructions  Discharge Instructions    Ambulatory referral to Neurology    Complete by:  As directed    Follow up with Dr. Leonie Man at [facility] in 2 months. Thanks.   Call MD for:  difficulty breathing, headache or visual disturbances    Complete by:  As directed    Call MD for:  extreme fatigue    Complete by:  As directed    Call MD for:  hives    Complete by:  As directed    Call MD for:  persistant dizziness or light-headedness    Complete by:  As directed    Call MD for:  persistant nausea and vomiting    Complete by:  As directed    Call MD for:  severe uncontrolled pain    Complete by:  As directed    Call MD for:  temperature >100.4    Complete by:  As directed    Diet - low sodium heart healthy    Complete by:  As directed    Diet Carb Modified    Complete by:  As directed    Discharge instructions    Complete by:  As directed    As per Neurologist, please continue aspirin and plavix for 3 weeks and then plavix only. Follow up with neurologist.   Increase activity slowly    Complete by:  As  directed      Allergies as of 05/17/2016      Reactions   Hydroxyzine Other (See Comments)   Visual hallucinations   Verapamil Other (See Comments)   Hallucinations    Benazepril Hcl Cough      Medication List    TAKE these medications   aspirin 325 MG EC tablet Take 1 tablet (325 mg total) by mouth daily.   cholecalciferol 1000 units tablet Commonly known as:  VITAMIN D Take 1 tablet (1,000 Units total) by mouth  daily.   clopidogrel 75 MG tablet Commonly known as:  PLAVIX Take 1 tablet (75 mg total) by mouth daily. Start taking on:  05/18/2016   eucerin lotion Apply topically as needed for dry skin. Use bid   finasteride 5 MG tablet Commonly known as:  PROSCAR Take 1 tablet (5 mg total) by mouth daily. For Prostate   FLUoxetine 20 MG tablet Commonly known as:  PROZAC Take 1 tablet (20 mg total) by mouth daily.   hydrALAZINE 10 MG tablet Commonly known as:  APRESOLINE Take 1 tablet (10 mg total) by mouth every 8 (eight) hours.   insulin aspart 100 UNIT/ML injection Commonly known as:  novoLOG Inject 0-9 Units into the skin 3 (three) times daily with meals.   linaclotide 290 MCG Caps capsule Commonly known as:  LINZESS Take 1 capsule (290 mcg total) by mouth daily.   linagliptin 5 MG Tabs tablet Commonly known as:  TRADJENTA Take 1 tablet (5 mg total) by mouth daily.   losartan 100 MG tablet Commonly known as:  COZAAR Take 1 tablet (100 mg total) by mouth at bedtime.   lovastatin 20 MG tablet Commonly known as:  MEVACOR Take 1 tablet (20 mg total) by mouth daily. For Cholesterol   metoprolol succinate 25 MG 24 hr tablet Commonly known as:  TOPROL-XL Take 3 tablets (75 mg total) by mouth daily. Take with or immediately following a meal. What changed:  Another medication with the same name was removed. Continue taking this medication, and follow the directions you see here.   pantoprazole 40 MG tablet Commonly known as:  PROTONIX Take 1 tablet (40 mg total) by mouth daily.   polyethylene glycol packet Commonly known as:  MIRALAX / GLYCOLAX Take 17 g by mouth daily. What changed:  when to take this  reasons to take this   sitaGLIPtin 50 MG tablet Commonly known as:  JANUVIA Take 50 mg by mouth daily.      Follow-up Information    SETHI,PRAMOD, MD. Schedule an appointment as soon as possible for a visit in 8 week(s).   Specialties:  Neurology, Radiology Contact  information: [address] [place] [address] [phone]        Walker Kehr, MD. Schedule an appointment as soon as possible for a visit in 1 week(s).   Specialty:  Internal Medicine Contact information: [place] [address] [phone]          Allergies  Allergen Reactions  . Hydroxyzine Other (See Comments)    Visual hallucinations  . Verapamil Other (See Comments)    Hallucinations   . Benazepril Hcl Cough    Consultations: Neurologist  Procedures/Studies: MRI  Subjective: Patient was seen and examined at bedside. Denied headache, dizziness, nausea, Bonnie, Zestril, shortness of breath.  Discharge Exam: Vitals:   05/17/16 1008 05/17/16 1343  BP: (!) 142/59 (!) 164/67  Pulse: 70 66  Resp: 17 18  Temp: 97.6 F (36.4  C) 97.7 F (36.5 C)   Vitals:   05/16/16 2131 05/17/16 0110 05/17/16 1008 05/17/16 1343  BP: (!) 184/89 (!) 143/57 (!) 142/59 (!) 164/67  Pulse: 75 63 70 66  Resp: 18 18 17 18   Temp:  97.5 F (36.4 C) 97.6 F (36.4 C) 97.7 F (36.5 C)  TempSrc: Oral Oral Oral Oral  SpO2: 100% 100% 100% 100%  Weight:      Height:        General: Pt is alert, awake, not in acute distress Cardiovascular: RRR, S1/S2 +, no rubs, no gallops Respiratory: CTA bilaterally, no wheezing, no rhonchi Abdominal: Soft, NT, ND, bowel sounds + Extremities: no edema, no cyanosis    The results of significant diagnostics from this hospitalization (including imaging, microbiology, ancillary and laboratory) are listed below for reference.     Microbiology: No results found for this or any previous visit (from the past 240 hour(s)).   Labs: BNP (last 3 results)  Recent Labs  05/15/16 1614  BNP 220.2*   Basic Metabolic Panel:  Recent Labs Lab 05/15/16 1114 05/16/16 0850  NA 135 138  K 4.9 4.2  CL 105 106  CO2 24 25  GLUCOSE 328* 177*  BUN 25* 22*  CREATININE 2.09* 1.86*  CALCIUM 9.4 9.0   Liver Function  Tests: No results for input(s): AST, ALT, ALKPHOS, BILITOT, PROT, ALBUMIN in the last 168 hours. No results for input(s): LIPASE, AMYLASE in the last 168 hours. No results for input(s): AMMONIA in the last 168 hours. CBC:  Recent Labs Lab 05/15/16 1114 05/16/16 0850  WBC 8.7 5.9  NEUTROABS  --  3.3  HGB 13.4 12.4*  HCT 40.3 38.4*  MCV 81.7 81.5  PLT 198 170   Cardiac Enzymes:  Recent Labs Lab 05/15/16 2241  TROPONINI <0.03   BNP: Invalid input(s): POCBNP CBG:  Recent Labs Lab 05/16/16 1204 05/16/16 1705 05/16/16 2133 05/17/16 0612 05/17/16 1121  GLUCAP 114* 184* 152* 124* 192*   D-Dimer No results for input(s): DDIMER in the last 72 hours. Hgb A1c  Recent Labs  05/16/16 0850  HGBA1C 8.5*   Lipid Profile  Recent Labs  05/16/16 0850  CHOL 131  HDL 36*  LDLCALC 72  TRIG 117  CHOLHDL 3.6   Thyroid function studies No results for input(s): TSH, T4TOTAL, T3FREE, THYROIDAB in the last 72 hours.  Invalid input(s): FREET3 Anemia work up No results for input(s): VITAMINB12, FOLATE, FERRITIN, TIBC, IRON, RETICCTPCT in the last 72 hours. Urinalysis    Component Value Date/Time   COLORURINE YELLOW 05/15/2016 1631   APPEARANCEUR HAZY (A) 05/15/2016 1631   LABSPEC 1.025 05/15/2016 1631   PHURINE 5.0 05/15/2016 1631   GLUCOSEU >=500 (A) 05/15/2016 1631   GLUCOSEU NEGATIVE 11/01/2015 1709   HGBUR SMALL (A) 05/15/2016 1631   BILIRUBINUR NEGATIVE 05/15/2016 1631   KETONESUR NEGATIVE 05/15/2016 1631   PROTEINUR 100 (A) 05/15/2016 1631   UROBILINOGEN 0.2 11/01/2015 1709   NITRITE NEGATIVE 05/15/2016 1631   LEUKOCYTESUR NEGATIVE 05/15/2016 1631   Sepsis Labs Invalid input(s): PROCALCITONIN,  WBC,  LACTICIDVEN Microbiology No results found for this or any previous visit (from the past 240 hour(s)).   Time coordinating discharge: 27 minutes  SIGNED:   Rosita Fire, MD  [facility] 05/17/2016, 2:04 PM  If 7PM-7AM, please contact  night-coverage [email or web address] Password [email or web address]

## 2016-05-17 NOTE — Consult Note (Signed)
   [facility] CM Inpatient Consult   05/17/2016  Caleb Kari Sr. 1940/04/25 [id number]   Chart reviewed for frequent admissions in the past 6 months.  Chart reviewed and reveals that the patient is Caleb Veley Sr. is a 76 y.o. male with history of T2DM with peripheral neuropathy, CKD, blindness, CAD, recent parietal infarct with [facility] 02/2016 who was discharged to home after CIR stay 3/7 with recommendations for 24 hours supervision.  He was readmitted on 05/15/16 with reports of being off balance, dizziness, multiple falls and increasing confusion with hallucinations. He was found to have acute/early subacute infarct in left lateral cerebellar hemisphere with stable chronic vascular changes. MRA brain negative for large vessel occlusion. CT C-spine with moderate to severe C4/5 foraminal narrowing. Dr. Erlinda Hong recommended DAPT for 3 weeks then plavix alone per CHANCE trial. PT/OT evaluations done yesterday revealing poor safety awareness with decrease awareness of deficits, decrease in balance and mild dysequilibrium. He is currently being recommended for skilled nursing facility for the need of 24 hour supervision and care.  No [facility] Care Management for community follow up noted at this time.  Natividad Brood, RN BSN [facility] Liaison  [phone] business mobile phone Toll free office [phone]

## 2016-05-17 NOTE — Progress Notes (Signed)
Patient will discharge to [facility] Anticipated discharge date: 4/26 Family notified: pt son, Bobbie Stack by family Report#: [phone]  [place] signing off.  Jorge Ny, LCSW Clinical Social Worker [phone]

## 2016-05-18 ENCOUNTER — Non-Acute Institutional Stay (SKILLED_NURSING_FACILITY): Payer: Medicare HMO | Admitting: Internal Medicine

## 2016-05-18 ENCOUNTER — Encounter: Payer: Self-pay | Admitting: Internal Medicine

## 2016-05-18 ENCOUNTER — Telehealth: Payer: Self-pay | Admitting: *Deleted

## 2016-05-18 DIAGNOSIS — F329 Major depressive disorder, single episode, unspecified: Secondary | ICD-10-CM

## 2016-05-18 DIAGNOSIS — K219 Gastro-esophageal reflux disease without esophagitis: Secondary | ICD-10-CM

## 2016-05-18 DIAGNOSIS — N183 Chronic kidney disease, stage 3 unspecified: Secondary | ICD-10-CM

## 2016-05-18 DIAGNOSIS — F341 Dysthymic disorder: Secondary | ICD-10-CM

## 2016-05-18 DIAGNOSIS — K5909 Other constipation: Secondary | ICD-10-CM

## 2016-05-18 DIAGNOSIS — I119 Hypertensive heart disease without heart failure: Secondary | ICD-10-CM | POA: Diagnosis not present

## 2016-05-18 DIAGNOSIS — E11311 Type 2 diabetes mellitus with unspecified diabetic retinopathy with macular edema: Secondary | ICD-10-CM | POA: Diagnosis not present

## 2016-05-18 DIAGNOSIS — I69354 Hemiplegia and hemiparesis following cerebral infarction affecting left non-dominant side: Secondary | ICD-10-CM

## 2016-05-18 DIAGNOSIS — Z8673 Personal history of transient ischemic attack (TIA), and cerebral infarction without residual deficits: Secondary | ICD-10-CM | POA: Diagnosis not present

## 2016-05-18 DIAGNOSIS — R531 Weakness: Secondary | ICD-10-CM

## 2016-05-18 NOTE — Progress Notes (Signed)
LOCATION: [place]  PCP: Walker Kehr, MD   Code Status: DNR  Goals of care: Advanced Directive information Advanced Directives 05/18/2016  Does Patient Have a Medical Advance Directive? Yes  Type of Advance Directive Out of facility DNR (pink MOST or yellow form)  Does patient want to make changes to medical advance directive? No - Patient declined  Would patient like information on creating a medical advance directive? -       Extended Emergency Contact Information Primary Emergency Contact: Geater,Margaret Address: [address], [place] Phone: [phone] Relation: Daughter Secondary Emergency Contact: Candiss Norse [place] Phone: [phone] Relation: Son   Allergies  Allergen Reactions  . Hydroxyzine Other (See Comments)    Visual hallucinations  . Verapamil Other (See Comments)    Hallucinations   . Benazepril Hcl Cough    Chief Complaint  Patient presents with  . New Admit To SNF    New Admission Visit      HPI:  Patient is a 76 y.o. 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 He was seen by neurology and placed on aspirin, plavix and statin. He has PMH of HTN, DM, CAD, CVA in 2/18, legally blind. He is seen in his room today.   Review of Systems:  Constitutional: Negative for fever, chills, diaphoresis. Feels weak and tired. HENT: Negative for headache, congestion, nasal discharge, sore throat, difficulty swallowing.   Eyes: Negative for discharge.  Respiratory: Negative for cough, shortness of breath  Cardiovascular: Negative for chest pain, palpitations.  Gastrointestinal: Negative for heartburn, nausea, vomiting, abdominal pain, loss of appetite. Last bowel movement was 2 days ago. Genitourinary: Negative for dysuria.  Musculoskeletal: Negative for  back pain, fall.  Skin: Negative for itching, rash.  Neurological: Negative for dizziness. Psychiatric/Behavioral: Negative for depression   Past Medical History:  Diagnosis Date  . Blindness   . CAD (coronary artery disease)   . Cholelithiasis   . CTS (carpal tunnel syndrome)    Left  . DM type 2 with diabetic peripheral neuropathy ([place])   . ED (erectile dysfunction)   . Elevated PSA   . GERD (gastroesophageal reflux disease)   . Glaucoma   . HTN (hypertension)   . 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 MI (myocardial infarction) ([place]) 1991  . Thyroid nodule   . Type II or unspecified type diabetes mellitus without mention of complication, not stated as uncontrolled    Past Surgical History:  Procedure Laterality Date  . CHOLECYSTECTOMY     Social History:   reports that he has quit smoking. He has never used smokeless tobacco. He reports that he does not drink alcohol or use drugs.  Family History  Problem Relation Age of Onset  . Hypertension Mother   . Kidney disease Father   . Hypertension Father   . Coronary artery disease      1st degree Male relative <60  . Diabetes      1st degree relative    Medications: Allergies as of 05/18/2016      Reactions   Hydroxyzine Other (See Comments)   Visual hallucinations   Verapamil Other (See Comments)   Hallucinations    Benazepril Hcl Cough      Medication List       Accurate as of  05/18/16  3:04 PM. Always use your most recent med list.          aspirin 325 MG EC tablet Take 1 tablet (325 mg total) by mouth daily.   cholecalciferol 1000 units tablet Commonly known as:  VITAMIN D Take 1 tablet (1,000 Units total) by mouth daily.   clopidogrel 75 MG tablet Commonly known as:  PLAVIX Take 1 tablet (75 mg total) by mouth daily.   eucerin lotion Apply topically as needed for dry skin. Use bid   finasteride 5 MG tablet Commonly known as:  PROSCAR Take 1 tablet (5 mg total) by mouth daily.  For Prostate   FLUoxetine 20 MG tablet Commonly known as:  PROZAC Take 1 tablet (20 mg total) by mouth daily.   hydrALAZINE 10 MG tablet Commonly known as:  APRESOLINE Take 1 tablet (10 mg total) by mouth every 8 (eight) hours.   insulin aspart 100 UNIT/ML injection Commonly known as:  novoLOG Inject 0-9 Units into the skin 3 (three) times daily with meals.   linaclotide 290 MCG Caps capsule Commonly known as:  LINZESS Take 1 capsule (290 mcg total) by mouth daily.   linagliptin 5 MG Tabs tablet Commonly known as:  TRADJENTA Take 1 tablet (5 mg total) by mouth daily.   losartan 100 MG tablet Commonly known as:  COZAAR Take 1 tablet (100 mg total) by mouth at bedtime.   lovastatin 20 MG tablet Commonly known as:  MEVACOR Take 1 tablet (20 mg total) by mouth daily. For Cholesterol   metoprolol succinate 25 MG 24 hr tablet Commonly known as:  TOPROL-XL Take 3 tablets (75 mg total) by mouth daily. Take with or immediately following a meal.   pantoprazole 40 MG tablet Commonly known as:  PROTONIX Take 1 tablet (40 mg total) by mouth daily.   polyethylene glycol packet Commonly known as:  MIRALAX / GLYCOLAX Take 17 g by mouth daily.   sitaGLIPtin 50 MG tablet Commonly known as:  JANUVIA Take 50 mg by mouth daily.       Immunizations: Immunization History  Administered Date(s) Administered  . Influenza Split 12/27/2010, 11/01/2011  . Influenza Whole 10/15/2008, 11/18/2009  . Influenza, High Dose Seasonal PF 10/03/2015  . Influenza,inj,Quad PF,36+ Mos 10/03/2012, 11/06/2013, 11/23/2014  . PPD Test 05/17/2016  . Pneumococcal Conjugate-13 01/02/2013  . Pneumococcal Polysaccharide-23 09/04/2005  . Tdap 12/27/2010     Physical Exam: Vitals:   05/18/16 1500  BP: 125/69  Pulse: 65  Resp: 18  Temp: 98.7 F (37.1 C)  TempSrc: Oral  SpO2: 98%  Weight: 199 lb 3.2 oz (90.4 kg)  Height: 5\' 8"  (1.727 m)   Body mass index is 30.29 kg/m.  General- elderly male,  obese, in no acute distress Head- normocephalic, atraumatic Nose- no nasal discharge Throat- moist mucus membrane Eyes- legally blind Neck- no cervical lymphadenopathy Cardiovascular- normal s1,s2, no murmur Respiratory- bilateral clear to auscultation, no wheeze, no rhonchi, no crackles, no use of accessory muscles Abdomen- bowel sounds present, soft, non tender, no guarding or rigidity Musculoskeletal- able to move all 4 extremities, weakness more prominent to left side, no leg edema Neurological- alert and oriented to person, place and time Skin- warm and dry Psychiatry- normal mood and affect    Labs reviewed: Basic Metabolic Panel:  Recent Labs  04/12/16 1107 05/15/16 1114 05/16/16 0850  NA 139 135 138  K 4.1 4.9 4.2  CL 107 105 106  CO2 28 24 25   GLUCOSE 169* 328* 177*  BUN 23 25* 22*  CREATININE 2.06* 2.09* 1.86*  CALCIUM 9.0 9.4 9.0   Liver Function Tests:  Recent Labs  03/13/16 1301 03/14/16 0321 03/19/16 0721  AST 22 18 17   ALT 28 23 22   ALKPHOS 73 68 66  BILITOT 0.4 0.4 0.7  PROT 7.6 7.0 7.5  ALBUMIN 3.5 3.1* 3.3*   No results for input(s): LIPASE, AMYLASE in the last 8760 hours. No results for input(s): AMMONIA in the last 8760 hours. CBC:  Recent Labs  03/26/16 1047 03/29/16 0612 05/15/16 1114 05/16/16 0850  WBC 7.7 6.8 8.7 5.9  NEUTROABS 4.9 3.7  --  3.3  HGB 12.8* 13.0 13.4 12.4*  HCT 39.0 40.0 40.3 38.4*  MCV 83.2 83.3 81.7 81.5  PLT 197 199 198 170   Cardiac Enzymes:  Recent Labs  05/15/16 2241  TROPONINI <0.03   BNP: Invalid input(s): POCBNP CBG:  Recent Labs  05/16/16 2133 05/17/16 0612 05/17/16 1121  GLUCAP 152* 124* 192*    Radiological Exams: Dg Chest 2 View  Result Date: 05/15/2016 CLINICAL DATA:  Gait imbalance beginning last night. EXAM: CHEST  2 VIEW COMPARISON:  Chest radiograph March 21, 2016 FINDINGS: Cardiac silhouette is upper limits of normal in size, status post median sternotomy for CABG. No  pleural effusion or focal consolidation. No pneumothorax. Soft tissue planes and included osseous structures are nonsuspicious. IMPRESSION: Stable examination: Borderline cardiomegaly, no acute pulmonary process. Electronically Signed   By: Elon Alas M.D.   On: 05/15/2016 14:58   Ct Head Wo Contrast  Result Date: 05/15/2016 CLINICAL DATA:  Gait imbalance beginning last night. Unwitnessed fall 2 days ago. History of diabetes, hypertension and stroke. EXAM: CT HEAD WITHOUT CONTRAST CT CERVICAL SPINE WITHOUT CONTRAST TECHNIQUE: Multidetector CT imaging of the head and cervical spine was performed following the standard protocol without intravenous contrast. Multiplanar CT image reconstructions of the cervical spine were also generated. COMPARISON:  MRI of the head March 13, 2016 FINDINGS: CT HEAD FINDINGS BRAIN: No intraparenchymal hemorrhage, mass effect nor midline shift. Old LEFT basal ganglia lacunar infarct with mild ex vacuo dilatation of the frontal horn of the lateral ventricles. Moderate to severe ventriculomegaly on the basis of global parenchymal brain volume loss. Old LEFT thalamus lacunar infarct. Small old LEFT cerebellar infarcts. Patchy supratentorial white matter hypodensities within normal range for patient's age, though non-specific are most compatible with chronic small vessel ischemic disease. No acute large vascular territory infarcts. No abnormal extra-axial fluid collections. Basal cisterns are patent. VASCULAR: Moderate calcific atherosclerosis of the carotid siphons. SKULL: No skull fracture. No significant scalp soft tissue swelling. Small nodule RIGHT frontal scalp, recommend direct inspection. SINUSES/ORBITS: The mastoid air-cells and included paranasal sinuses are well-aerated. LEFT frontal osteoma. The included ocular globes and orbital contents are non-suspicious. Status post bilateral ocular lens implants with RIGHT glaucoma drainage device. OTHER: None. CT CERVICAL SPINE  FINDINGS ALIGNMENT: Straightened lordosis. Vertebral bodies in alignment. SKULL BASE AND VERTEBRAE: Cervical vertebral bodies and posterior elements are intact. Moderate to severe C4-5 and C5-6 disc height loss with endplate spurring and uncovertebral hypertrophy compatible with degenerative discs. C1-2 articulation maintained with moderate arthropathy. Borderline congenital canal narrowing on the basis of foreshortened pedicles. SOFT TISSUES AND SPINAL CANAL: Nonacute. Moderate calcific atherosclerosis carotid bifurcations. DISC LEVELS: Moderate RIGHT C3-4, moderate to severe C4-5, moderate C5-6 neural foraminal narrowing. UPPER CHEST: Lung apices are clear. OTHER: None. IMPRESSION: CT HEAD: No acute intracranial process. Stable examination including moderate to severe global parenchymal brain volume loss and  multiple old small infarcts. CT CERVICAL SPINE: No acute fracture or malalignment. Moderate to severe C4-5 neural foraminal narrowing. Electronically Signed   By: Elon Alas M.D.   On: 05/15/2016 15:07   Ct Cervical Spine Wo Contrast  Result Date: 05/15/2016 CLINICAL DATA:  Gait imbalance beginning last night. Unwitnessed fall 2 days ago. History of diabetes, hypertension and stroke. EXAM: CT HEAD WITHOUT CONTRAST CT CERVICAL SPINE WITHOUT CONTRAST TECHNIQUE: Multidetector CT imaging of the head and cervical spine was performed following the standard protocol without intravenous contrast. Multiplanar CT image reconstructions of the cervical spine were also generated. COMPARISON:  MRI of the head March 13, 2016 FINDINGS: CT HEAD FINDINGS BRAIN: No intraparenchymal hemorrhage, mass effect nor midline shift. Old LEFT basal ganglia lacunar infarct with mild ex vacuo dilatation of the frontal horn of the lateral ventricles. Moderate to severe ventriculomegaly on the basis of global parenchymal brain volume loss. Old LEFT thalamus lacunar infarct. Small old LEFT cerebellar infarcts. Patchy  supratentorial white matter hypodensities within normal range for patient's age, though non-specific are most compatible with chronic small vessel ischemic disease. No acute large vascular territory infarcts. No abnormal extra-axial fluid collections. Basal cisterns are patent. VASCULAR: Moderate calcific atherosclerosis of the carotid siphons. SKULL: No skull fracture. No significant scalp soft tissue swelling. Small nodule RIGHT frontal scalp, recommend direct inspection. SINUSES/ORBITS: The mastoid air-cells and included paranasal sinuses are well-aerated. LEFT frontal osteoma. The included ocular globes and orbital contents are non-suspicious. Status post bilateral ocular lens implants with RIGHT glaucoma drainage device. OTHER: None. CT CERVICAL SPINE FINDINGS ALIGNMENT: Straightened lordosis. Vertebral bodies in alignment. SKULL BASE AND VERTEBRAE: Cervical vertebral bodies and posterior elements are intact. Moderate to severe C4-5 and C5-6 disc height loss with endplate spurring and uncovertebral hypertrophy compatible with degenerative discs. C1-2 articulation maintained with moderate arthropathy. Borderline congenital canal narrowing on the basis of foreshortened pedicles. SOFT TISSUES AND SPINAL CANAL: Nonacute. Moderate calcific atherosclerosis carotid bifurcations. DISC LEVELS: Moderate RIGHT C3-4, moderate to severe C4-5, moderate C5-6 neural foraminal narrowing. UPPER CHEST: Lung apices are clear. OTHER: None. IMPRESSION: CT HEAD: No acute intracranial process. Stable examination including moderate to severe global parenchymal brain volume loss and multiple old small infarcts. CT CERVICAL SPINE: No acute fracture or malalignment. Moderate to severe C4-5 neural foraminal narrowing. Electronically Signed   By: Elon Alas M.D.   On: 05/15/2016 15:07   [name] Head Wo Contrast  Result Date: 05/16/2016 CLINICAL DATA:  Acute presentation with balance disturbance and left cerebellar infarction.  EXAM: MRA HEAD WITHOUT CONTRAST TECHNIQUE: Angiographic images of the Circle of Willis were obtained using MRA technique without intravenous contrast. COMPARISON:  05/15/2016 FINDINGS: Both internal carotid arteries are widely patent into the brain. The anterior and middle cerebral vessels are normal without proximal stenosis, aneurysm or vascular malformation. Both vertebral arteries are widely patent to the basilar. No basilar stenosis. Dominant PICA, the origin of which is not included in the region. Anterior inferior cerebellar arteries patent bilaterally, larger on the right. Superior cerebellar and posterior cerebral arteries patent and normal. IMPRESSION: Within normal limits. No large or medium vessel stenosis or occlusion. Electronically Signed   By: Nelson Chimes M.D.   On: 05/16/2016 11:37   Mr Brain Wo Contrast  Result Date: 05/15/2016 CLINICAL DATA:  Unwitnessed fall in new subjective balance deficits. EXAM: MRI HEAD WITHOUT CONTRAST TECHNIQUE: Multiplanar, multiecho pulse sequences of the brain and surrounding structures were obtained without intravenous contrast. COMPARISON:  03/13/2016 MRI of the head.  FINDINGS: Brain: Subcentimeter focus of diffusion restriction within the lateral left cerebellar hemisphere compatible with acute/ early subacute infarction. No additional diffusion signal abnormality of the brain. Stable foci of T2 FLAIR hyperintense signal abnormality within subcortical and periventricular white matter as well as the pons compatible with chronic microvascular ischemic changes. Stable brain parenchymal volume loss. Stable small chronic lacunar infarctions within the left thalamus and left cerebellar hemisphere. No new susceptibility hypointensity to indicate intracranial hemorrhage. Ventricle size is commensurate to the degree of volume loss. No focal mass effect. No extra-axial collection. No effacement of basilar cisterns. Vascular: Normal flow voids. Skull and upper cervical  spine: Normal marrow signal. Sinuses/Orbits: Negative. Other: None. IMPRESSION: 1. Subcentimeter acute/ early subacute infarction within the left lateral cerebellar hemisphere. No acute hemorrhage identified. 2. Stable chronic microvascular ischemic changes and parenchymal volume loss of the brain. These results were called by telephone at the time of interpretation on 05/15/2016 at 6:38 pm to [place], who verbally acknowledged these results. Electronically Signed   By: Kristine Garbe M.D.   On: 05/15/2016 18:41    Assessment/Plan  Generalized weakness From deconditioning. Will have him work with physical therapy and occupational therapy team to help with gait training and muscle strengthening exercises.fall precautions. Skin care. Encourage to be out of bed.   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 Will have patient work with PT/OT as tolerated to regain strength and restore function.  Fall precautions are in place.  History of CVA Continue aspirin and plavix for 3 months and then plavix daily. Continue lovastatin 20 mg daily. Make follow up with neurology.   Hypertension Continue cozaar 100 mg daily, hydralazine 10 mg tid and metoprolol succinate er 75 mg daily, monitor BP and BMP  BPH Continue proscar 5 mg daily and monitor  Dm type 2 Lab Results  Component Value Date   HGBA1C 8.5 (H) 05/16/2016   Monitor cbg, continue tradjenta 5 mg daily, sitagliptin 50 mg daily. Continue novolog SSI. Continue aspirin and statin. He is legally blind. Provide supportive care.   Chronic constipation Continue linzess 290 mcg daily and miralax 17 g daily.  Chronic depression Continue fluoxetine and monitor.   gerd Continue protonix 40 mg daily and monitor  ckd stage 3 Monitor daily   Goals of care: short term rehabilitation   Labs/tests ordered: cbc, bmp 05/21/16   Family/ staff Communication: reviewed care plan with patient and nursing  supervisor    Blanchie Serve, MD Internal Medicine [place], [place] [address] Cell Phone (Monday-Friday 8 am - 5 pm): [phone] On Call: [phone] and follow prompts after 5 pm and on weekends Office Phone: [phone] Office Fax: [phone]

## 2016-05-18 NOTE — Telephone Encounter (Signed)
Pt was on TCM list admitted 05/15/16 for Acute/subacute infarction of left lateral cerebellar hemisphere:Small L lateral cerebellar infarct likely secondary to small vessel disease.pt was D/C 05/17/16 and sent to SNF. In d/c summary after being release from SN pt will need to f/u w/PCP 1-2 weeks....Johny Chess

## 2016-05-25 ENCOUNTER — Non-Acute Institutional Stay (SKILLED_NURSING_FACILITY): Payer: Medicare HMO | Admitting: Internal Medicine

## 2016-05-25 ENCOUNTER — Encounter: Payer: Self-pay | Admitting: Internal Medicine

## 2016-05-25 DIAGNOSIS — Z8673 Personal history of transient ischemic attack (TIA), and cerebral infarction without residual deficits: Secondary | ICD-10-CM | POA: Diagnosis not present

## 2016-05-25 DIAGNOSIS — K5909 Other constipation: Secondary | ICD-10-CM

## 2016-05-25 DIAGNOSIS — E11311 Type 2 diabetes mellitus with unspecified diabetic retinopathy with macular edema: Secondary | ICD-10-CM | POA: Diagnosis not present

## 2016-05-25 DIAGNOSIS — K219 Gastro-esophageal reflux disease without esophagitis: Secondary | ICD-10-CM | POA: Diagnosis not present

## 2016-05-25 DIAGNOSIS — R531 Weakness: Secondary | ICD-10-CM | POA: Diagnosis not present

## 2016-05-25 DIAGNOSIS — I10 Essential (primary) hypertension: Secondary | ICD-10-CM

## 2016-05-25 NOTE — Progress Notes (Signed)
LOCATION: [place]  PCP: Walker Kehr, MD   Code Status: DNR  Goals of care: Advanced Directive information Advanced Directives 05/18/2016  Does Patient Have a Medical Advance Directive? Yes  Type of Advance Directive Out of facility DNR (pink MOST or yellow form)  Does patient want to make changes to medical advance directive? No - Patient declined  Would patient like information on creating a medical advance directive? -       Extended Emergency Contact Information Primary Emergency Contact: Geater,Margaret Address: [place], [place] Phone: [phone] Relation: Daughter Secondary Emergency Contact: Candiss [place] Phone: [phone] Relation: Son   Allergies  Allergen Reactions  . Hydroxyzine Other (See Comments)    Visual hallucinations  . Verapamil Other (See Comments)    Hallucinations   . Benazepril Hcl Cough    Chief Complaint  Patient presents with  . Discharge Note    Discharge Visit      HPI:  Patient is a 76 y.o. male seen today for discharge visit. 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 He has PMH of HTN, DM, CAD, CVA in 2/18, legally blind. He is seen in his room today. He has worked with therapy team and made improvement.   Review of Systems:  Constitutional: Negative for fever, chills HENT: Negative for headache, congestion, nasal discharge Eyes: Negative for discharge. he is legally blind.  Respiratory: Negative for cough, shortness of breath  Cardiovascular: Negative for chest pain, palpitations.  Gastrointestinal: Negative for heartburn, nausea, vomiting, abdominal pain, loss of appetite.  Genitourinary: Negative for dysuria.  Musculoskeletal: Negative for back pain, fall.  Skin: Negative for itching, rash.  Neurological: Negative  for dizziness. Psychiatric/Behavioral: Negative for depression   Past Medical History:  Diagnosis Date  . Blindness   . CAD (coronary artery disease)   . Cholelithiasis   . CTS (carpal tunnel syndrome)    Left  . DM type 2 with diabetic peripheral neuropathy ([place])   . ED (erectile dysfunction)   . Elevated PSA   . GERD (gastroesophageal reflux disease)   . Glaucoma   . HTN (hypertension)   . 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 MI (myocardial infarction) ([place]) 1991  . Thyroid nodule   . Type II or unspecified type diabetes mellitus without mention of complication, not stated as uncontrolled    Past Surgical History:  Procedure Laterality Date  . CHOLECYSTECTOMY     Social History:   reports that he has quit smoking. He has never used smokeless tobacco. He reports that he does not drink alcohol or use drugs.  Family History  Problem Relation Age of Onset  . Hypertension Mother   . Kidney disease Father   . Hypertension Father   . Coronary artery disease      1st degree Male relative <60  . Diabetes      1st degree relative    Medications: Allergies as of 05/25/2016      Reactions   Hydroxyzine Other (See Comments)   Visual hallucinations   Verapamil Other (See Comments)   Hallucinations    Benazepril Hcl Cough      Medication List       Accurate as of 05/25/16  3:22 PM. Always use your most recent med list.  aspirin 325 MG EC tablet Take 1 tablet (325 mg total) by mouth daily.   cholecalciferol 1000 units tablet Commonly known as:  VITAMIN D Take 1 tablet (1,000 Units total) by mouth daily.   clopidogrel 75 MG tablet Commonly known as:  PLAVIX Take 1 tablet (75 mg total) by mouth daily.   eucerin cream Apply 1 application topically 2 (two) times daily as needed for dry skin.   finasteride 5 MG tablet Commonly known as:  PROSCAR Take 1 tablet (5 mg total) by mouth daily. For Prostate   FLUoxetine 20 MG tablet Commonly  known as:  PROZAC Take 1 tablet (20 mg total) by mouth daily.   hydrALAZINE 10 MG tablet Commonly known as:  APRESOLINE Take 1 tablet (10 mg total) by mouth every 8 (eight) hours.   insulin aspart 100 UNIT/ML injection Commonly known as:  novoLOG Inject 0-9 Units into the skin 3 (three) times daily with meals.   linaclotide 290 MCG Caps capsule Commonly known as:  LINZESS Take 1 capsule (290 mcg total) by mouth daily.   linagliptin 5 MG Tabs tablet Commonly known as:  TRADJENTA Take 1 tablet (5 mg total) by mouth daily.   lovastatin 20 MG tablet Commonly known as:  MEVACOR Take 1 tablet (20 mg total) by mouth daily. For Cholesterol   metoprolol succinate 25 MG 24 hr tablet Commonly known as:  TOPROL-XL Take 3 tablets (75 mg total) by mouth daily. Take with or immediately following a meal.   pantoprazole 40 MG tablet Commonly known as:  PROTONIX Take 1 tablet (40 mg total) by mouth daily.   polyethylene glycol packet Commonly known as:  MIRALAX / GLYCOLAX Take 17 g by mouth daily.   sitaGLIPtin 50 MG tablet Commonly known as:  JANUVIA Take 50 mg by mouth daily.       Immunizations: Immunization History  Administered Date(s) Administered  . Influenza Split 12/27/2010, 11/01/2011  . Influenza Whole 10/15/2008, 11/18/2009  . Influenza, High Dose Seasonal PF 10/03/2015  . Influenza,inj,Quad PF,36+ Mos 10/03/2012, 11/06/2013, 11/23/2014  . PPD Test 05/17/2016  . Pneumococcal Conjugate-13 01/02/2013  . Pneumococcal Polysaccharide-23 09/04/2005  . Tdap 12/27/2010     Physical Exam: Vitals:   05/25/16 1517  BP: 135/80  Pulse: 80  Resp: 20  Temp: 97.4 F (36.3 C)  TempSrc: Oral  SpO2: 100%  Weight: 201 lb (91.2 kg)  Height: 5\' 6"  (1.676 m)   Body mass index is 32.44 kg/m.  General- elderly male, obese, in no acute distress Head- normocephalic, atraumatic Nose- no nasal discharge Throat- moist mucus membrane Eyes- legally blind Neck- no cervical  lymphadenopathy Cardiovascular- normal s1,s2, no murmur Respiratory- bilateral clear to auscultation, no wheeze, no rhonchi, no crackles, no use of accessory muscles Abdomen- bowel sounds present, soft, non tender, no guarding or rigidity Musculoskeletal- able to move all 4 extremities, no leg edema Neurological- alert and oriented to person, place and time Skin- warm and dry Psychiatry- normal mood and affect    Labs reviewed: Basic Metabolic Panel:  Recent Labs  04/12/16 1107 05/15/16 1114 05/16/16 0850  NA 139 135 138  K 4.1 4.9 4.2  CL 107 105 106  CO2 28 24 25   GLUCOSE 169* 328* 177*  BUN 23 25* 22*  CREATININE 2.06* 2.09* 1.86*  CALCIUM 9.0 9.4 9.0   Liver Function Tests:  Recent Labs  03/13/16 1301 03/14/16 0321 03/19/16 0721  AST 22 18 17   ALT 28 23 22   ALKPHOS 73 68 66  BILITOT 0.4 0.4 0.7  PROT 7.6 7.0 7.5  ALBUMIN 3.5 3.1* 3.3*   No results for input(s): LIPASE, AMYLASE in the last 8760 hours. No results for input(s): AMMONIA in the last 8760 hours. CBC:  Recent Labs  03/26/16 1047 03/29/16 0612 05/15/16 1114 05/16/16 0850  WBC 7.7 6.8 8.7 5.9  NEUTROABS 4.9 3.7  --  3.3  HGB 12.8* 13.0 13.4 12.4*  HCT 39.0 40.0 40.3 38.4*  MCV 83.2 83.3 81.7 81.5  PLT 197 199 198 170   Cardiac Enzymes:  Recent Labs  05/15/16 2241  TROPONINI <0.03   BNP: Invalid input(s): POCBNP CBG:  Recent Labs  05/16/16 2133 05/17/16 0612 05/17/16 1121  GLUCAP 152* 124* 192*    Radiological Exams: Dg Chest 2 View  Result Date: 05/15/2016 CLINICAL DATA:  Gait imbalance beginning last night. EXAM: CHEST  2 VIEW COMPARISON:  Chest radiograph March 21, 2016 FINDINGS: Cardiac silhouette is upper limits of normal in size, status post median sternotomy for CABG. No pleural effusion or focal consolidation. No pneumothorax. Soft tissue planes and included osseous structures are nonsuspicious. IMPRESSION: Stable examination: Borderline cardiomegaly, no acute  pulmonary process. Electronically Signed   By: Elon Alas M.D.   On: 05/15/2016 14:58   Ct Head Wo Contrast  Result Date: 05/15/2016 CLINICAL DATA:  Gait imbalance beginning last night. Unwitnessed fall 2 days ago. History of diabetes, hypertension and stroke. EXAM: CT HEAD WITHOUT CONTRAST CT CERVICAL SPINE WITHOUT CONTRAST TECHNIQUE: Multidetector CT imaging of the head and cervical spine was performed following the standard protocol without intravenous contrast. Multiplanar CT image reconstructions of the cervical spine were also generated. COMPARISON:  MRI of the head March 13, 2016 FINDINGS: CT HEAD FINDINGS BRAIN: No intraparenchymal hemorrhage, mass effect nor midline shift. Old LEFT basal ganglia lacunar infarct with mild ex vacuo dilatation of the frontal horn of the lateral ventricles. Moderate to severe ventriculomegaly on the basis of global parenchymal brain volume loss. Old LEFT thalamus lacunar infarct. Small old LEFT cerebellar infarcts. Patchy supratentorial white matter hypodensities within normal range for patient's age, though non-specific are most compatible with chronic small vessel ischemic disease. No acute large vascular territory infarcts. No abnormal extra-axial fluid collections. Basal cisterns are patent. VASCULAR: Moderate calcific atherosclerosis of the carotid siphons. SKULL: No skull fracture. No significant scalp soft tissue swelling. Small nodule RIGHT frontal scalp, recommend direct inspection. SINUSES/ORBITS: The mastoid air-cells and included paranasal sinuses are well-aerated. LEFT frontal osteoma. The included ocular globes and orbital contents are non-suspicious. Status post bilateral ocular lens implants with RIGHT glaucoma drainage device. OTHER: None. CT CERVICAL SPINE FINDINGS ALIGNMENT: Straightened lordosis. Vertebral bodies in alignment. SKULL BASE AND VERTEBRAE: Cervical vertebral bodies and posterior elements are intact. Moderate to severe C4-5 and C5-6  disc height loss with endplate spurring and uncovertebral hypertrophy compatible with degenerative discs. C1-2 articulation maintained with moderate arthropathy. Borderline congenital canal narrowing on the basis of foreshortened pedicles. SOFT TISSUES AND SPINAL CANAL: Nonacute. Moderate calcific atherosclerosis carotid bifurcations. DISC LEVELS: Moderate RIGHT C3-4, moderate to severe C4-5, moderate C5-6 neural foraminal narrowing. UPPER CHEST: Lung apices are clear. OTHER: None. IMPRESSION: CT HEAD: No acute intracranial process. Stable examination including moderate to severe global parenchymal brain volume loss and multiple old small infarcts. CT CERVICAL SPINE: No acute fracture or malalignment. Moderate to severe C4-5 neural foraminal narrowing. Electronically Signed   By: Elon Alas M.D.   On: 05/15/2016 15:07   Ct Cervical Spine Wo Contrast  Result Date: 05/15/2016 CLINICAL  DATA:  Gait imbalance beginning last night. Unwitnessed fall 2 days ago. History of diabetes, hypertension and stroke. EXAM: CT HEAD WITHOUT CONTRAST CT CERVICAL SPINE WITHOUT CONTRAST TECHNIQUE: Multidetector CT imaging of the head and cervical spine was performed following the standard protocol without intravenous contrast. Multiplanar CT image reconstructions of the cervical spine were also generated. COMPARISON:  MRI of the head March 13, 2016 FINDINGS: CT HEAD FINDINGS BRAIN: No intraparenchymal hemorrhage, mass effect nor midline shift. Old LEFT basal ganglia lacunar infarct with mild ex vacuo dilatation of the frontal horn of the lateral ventricles. Moderate to severe ventriculomegaly on the basis of global parenchymal brain volume loss. Old LEFT thalamus lacunar infarct. Small old LEFT cerebellar infarcts. Patchy supratentorial white matter hypodensities within normal range for patient's age, though non-specific are most compatible with chronic small vessel ischemic disease. No acute large vascular territory  infarcts. No abnormal extra-axial fluid collections. Basal cisterns are patent. VASCULAR: Moderate calcific atherosclerosis of the carotid siphons. SKULL: No skull fracture. No significant scalp soft tissue swelling. Small nodule RIGHT frontal scalp, recommend direct inspection. SINUSES/ORBITS: The mastoid air-cells and included paranasal sinuses are well-aerated. LEFT frontal osteoma. The included ocular globes and orbital contents are non-suspicious. Status post bilateral ocular lens implants with RIGHT glaucoma drainage device. OTHER: None. CT CERVICAL SPINE FINDINGS ALIGNMENT: Straightened lordosis. Vertebral bodies in alignment. SKULL BASE AND VERTEBRAE: Cervical vertebral bodies and posterior elements are intact. Moderate to severe C4-5 and C5-6 disc height loss with endplate spurring and uncovertebral hypertrophy compatible with degenerative discs. C1-2 articulation maintained with moderate arthropathy. Borderline congenital canal narrowing on the basis of foreshortened pedicles. SOFT TISSUES AND SPINAL CANAL: Nonacute. Moderate calcific atherosclerosis carotid bifurcations. DISC LEVELS: Moderate RIGHT C3-4, moderate to severe C4-5, moderate C5-6 neural foraminal narrowing. UPPER CHEST: Lung apices are clear. OTHER: None. IMPRESSION: CT HEAD: No acute intracranial process. Stable examination including moderate to severe global parenchymal brain volume loss and multiple old small infarcts. CT CERVICAL SPINE: No acute fracture or malalignment. Moderate to severe C4-5 neural foraminal narrowing. Electronically Signed   By: Elon Alas M.D.   On: 05/15/2016 15:07   [name] Head Wo Contrast  Result Date: 05/16/2016 CLINICAL DATA:  Acute presentation with balance disturbance and left cerebellar infarction. EXAM: MRA HEAD WITHOUT CONTRAST TECHNIQUE: Angiographic images of the Circle of Willis were obtained using MRA technique without intravenous contrast. COMPARISON:  05/15/2016 FINDINGS: Both internal  carotid arteries are widely patent into the brain. The anterior and middle cerebral vessels are normal without proximal stenosis, aneurysm or vascular malformation. Both vertebral arteries are widely patent to the basilar. No basilar stenosis. Dominant PICA, the origin of which is not included in the region. Anterior inferior cerebellar arteries patent bilaterally, larger on the right. Superior cerebellar and posterior cerebral arteries patent and normal. IMPRESSION: Within normal limits. No large or medium vessel stenosis or occlusion. Electronically Signed   By: Nelson Chimes M.D.   On: 05/16/2016 11:37   Mr Brain Wo Contrast  Result Date: 05/15/2016 CLINICAL DATA:  Unwitnessed fall in new subjective balance deficits. EXAM: MRI HEAD WITHOUT CONTRAST TECHNIQUE: Multiplanar, multiecho pulse sequences of the brain and surrounding structures were obtained without intravenous contrast. COMPARISON:  03/13/2016 MRI of the head. FINDINGS: Brain: Subcentimeter focus of diffusion restriction within the lateral left cerebellar hemisphere compatible with acute/ early subacute infarction. No additional diffusion signal abnormality of the brain. Stable foci of T2 FLAIR hyperintense signal abnormality within subcortical and periventricular white matter as well as the  pons compatible with chronic microvascular ischemic changes. Stable brain parenchymal volume loss. Stable small chronic lacunar infarctions within the left thalamus and left cerebellar hemisphere. No new susceptibility hypointensity to indicate intracranial hemorrhage. Ventricle size is commensurate to the degree of volume loss. No focal mass effect. No extra-axial collection. No effacement of basilar cisterns. Vascular: Normal flow voids. Skull and upper cervical spine: Normal marrow signal. Sinuses/Orbits: Negative. Other: None. IMPRESSION: 1. Subcentimeter acute/ early subacute infarction within the left lateral cerebellar hemisphere. No acute hemorrhage  identified. 2. Stable chronic microvascular ischemic changes and parenchymal volume loss of the brain. These results were called by telephone at the time of interpretation on 05/15/2016 at 6:38 pm to [place], who verbally acknowledged these results. Electronically Signed   By: Kristine Garbe M.D.   On: 05/15/2016 18:41    Assessment/Plan  Generalized weakness Post CVA. Will have him work with physical therapy and occupational therapy team to help with gait training and muscle strengthening exercises at home.fall precautions.    History of CVA Continue aspirin and plavix for 3 months and then plavix daily. Continue lovastatin 20 mg daily. He will need to follow up with neurology. PT, OT and SLP to work with him at home for gait strengthening and cognition.   Hypertension Continue cozaar 100 mg daily, hydralazine 10 mg tid and metoprolol succinate er 75 mg daily  BPH Continue proscar 5 mg daily and monitor  Dm type 2 with ophthalmic complications Lab Results  Component Value Date   HGBA1C 8.5 (H) 05/16/2016   Monitor cbg, continue tradjenta 5 mg daily, sitagliptin 50 mg daily.   Chronic constipation Continue linzess 290 mcg daily and miralax 17 g daily.  gerd Continue protonix 40 mg daily  Patient is being discharged with home health services:  Home health PT, OT, SLP  Patient is being discharged with the following durable medical equipment:  none.   Patient has been advised to f/u with their PCP in 1-2 weeks to bring them up to date on their rehab stay.  They were provided with a 30 day supply of scripts for prescription medications and refills must be obtained from their PCP.      Blanchie Serve, MD Internal Medicine [facility] [address] [place], [place] [address] Cell Phone (Monday-Friday 8 am - 5 pm): [phone] On Call: [phone] and follow prompts after 5 pm and on weekends Office Phone: [phone] Office Fax:  [phone]

## 2016-05-29 ENCOUNTER — Telehealth: Payer: Self-pay | Admitting: Internal Medicine

## 2016-05-29 DIAGNOSIS — I129 Hypertensive chronic kidney disease with stage 1 through stage 4 chronic kidney disease, or unspecified chronic kidney disease: Secondary | ICD-10-CM | POA: Diagnosis not present

## 2016-05-29 DIAGNOSIS — I69320 Aphasia following cerebral infarction: Secondary | ICD-10-CM | POA: Diagnosis not present

## 2016-05-29 DIAGNOSIS — N183 Chronic kidney disease, stage 3 (moderate): Secondary | ICD-10-CM | POA: Diagnosis not present

## 2016-05-29 DIAGNOSIS — I69354 Hemiplegia and hemiparesis following cerebral infarction affecting left non-dominant side: Secondary | ICD-10-CM | POA: Diagnosis not present

## 2016-05-29 DIAGNOSIS — I2510 Atherosclerotic heart disease of native coronary artery without angina pectoris: Secondary | ICD-10-CM | POA: Diagnosis not present

## 2016-05-29 DIAGNOSIS — E1139 Type 2 diabetes mellitus with other diabetic ophthalmic complication: Secondary | ICD-10-CM | POA: Diagnosis not present

## 2016-05-29 DIAGNOSIS — H548 Legal blindness, as defined in USA: Secondary | ICD-10-CM | POA: Diagnosis not present

## 2016-05-29 DIAGNOSIS — E11311 Type 2 diabetes mellitus with unspecified diabetic retinopathy with macular edema: Secondary | ICD-10-CM | POA: Diagnosis not present

## 2016-05-29 DIAGNOSIS — E1122 Type 2 diabetes mellitus with diabetic chronic kidney disease: Secondary | ICD-10-CM | POA: Diagnosis not present

## 2016-05-29 NOTE — Telephone Encounter (Signed)
[phone] Madison Hickman called and needs to talk to you about patients medications. She states since he got him they are unsure what all he needs to be taking and such. Please follow up with her as soon as possible. Thank you.

## 2016-05-30 DIAGNOSIS — I2510 Atherosclerotic heart disease of native coronary artery without angina pectoris: Secondary | ICD-10-CM | POA: Diagnosis not present

## 2016-05-30 DIAGNOSIS — E1139 Type 2 diabetes mellitus with other diabetic ophthalmic complication: Secondary | ICD-10-CM | POA: Diagnosis not present

## 2016-05-30 DIAGNOSIS — I129 Hypertensive chronic kidney disease with stage 1 through stage 4 chronic kidney disease, or unspecified chronic kidney disease: Secondary | ICD-10-CM | POA: Diagnosis not present

## 2016-05-30 DIAGNOSIS — E11311 Type 2 diabetes mellitus with unspecified diabetic retinopathy with macular edema: Secondary | ICD-10-CM | POA: Diagnosis not present

## 2016-05-30 DIAGNOSIS — H548 Legal blindness, as defined in USA: Secondary | ICD-10-CM | POA: Diagnosis not present

## 2016-05-30 DIAGNOSIS — I69354 Hemiplegia and hemiparesis following cerebral infarction affecting left non-dominant side: Secondary | ICD-10-CM | POA: Diagnosis not present

## 2016-05-30 DIAGNOSIS — I69320 Aphasia following cerebral infarction: Secondary | ICD-10-CM | POA: Diagnosis not present

## 2016-05-30 DIAGNOSIS — N183 Chronic kidney disease, stage 3 (moderate): Secondary | ICD-10-CM | POA: Diagnosis not present

## 2016-05-30 DIAGNOSIS — E1122 Type 2 diabetes mellitus with diabetic chronic kidney disease: Secondary | ICD-10-CM | POA: Diagnosis not present

## 2016-05-30 NOTE — Telephone Encounter (Signed)
[name], Can you please follow back up with [name] as soon as possible.

## 2016-05-30 NOTE — Telephone Encounter (Signed)
Med list was updated Thx

## 2016-05-31 NOTE — Telephone Encounter (Signed)
LMTCB

## 2016-06-01 DIAGNOSIS — I69354 Hemiplegia and hemiparesis following cerebral infarction affecting left non-dominant side: Secondary | ICD-10-CM | POA: Diagnosis not present

## 2016-06-01 DIAGNOSIS — I69320 Aphasia following cerebral infarction: Secondary | ICD-10-CM | POA: Diagnosis not present

## 2016-06-01 DIAGNOSIS — E1122 Type 2 diabetes mellitus with diabetic chronic kidney disease: Secondary | ICD-10-CM | POA: Diagnosis not present

## 2016-06-01 DIAGNOSIS — H548 Legal blindness, as defined in USA: Secondary | ICD-10-CM | POA: Diagnosis not present

## 2016-06-01 DIAGNOSIS — N183 Chronic kidney disease, stage 3 (moderate): Secondary | ICD-10-CM | POA: Diagnosis not present

## 2016-06-01 DIAGNOSIS — I129 Hypertensive chronic kidney disease with stage 1 through stage 4 chronic kidney disease, or unspecified chronic kidney disease: Secondary | ICD-10-CM | POA: Diagnosis not present

## 2016-06-01 DIAGNOSIS — E11311 Type 2 diabetes mellitus with unspecified diabetic retinopathy with macular edema: Secondary | ICD-10-CM | POA: Diagnosis not present

## 2016-06-01 DIAGNOSIS — E1139 Type 2 diabetes mellitus with other diabetic ophthalmic complication: Secondary | ICD-10-CM | POA: Diagnosis not present

## 2016-06-01 DIAGNOSIS — I2510 Atherosclerotic heart disease of native coronary artery without angina pectoris: Secondary | ICD-10-CM | POA: Diagnosis not present

## 2016-06-01 NOTE — Telephone Encounter (Signed)
Spoke with [name] and went through patient medications and gave verbal for medication management to help pt with getting his medications correct

## 2016-06-04 DIAGNOSIS — H548 Legal blindness, as defined in USA: Secondary | ICD-10-CM | POA: Diagnosis not present

## 2016-06-04 DIAGNOSIS — I69354 Hemiplegia and hemiparesis following cerebral infarction affecting left non-dominant side: Secondary | ICD-10-CM | POA: Diagnosis not present

## 2016-06-04 DIAGNOSIS — E1122 Type 2 diabetes mellitus with diabetic chronic kidney disease: Secondary | ICD-10-CM | POA: Diagnosis not present

## 2016-06-04 DIAGNOSIS — I69320 Aphasia following cerebral infarction: Secondary | ICD-10-CM | POA: Diagnosis not present

## 2016-06-04 DIAGNOSIS — I2510 Atherosclerotic heart disease of native coronary artery without angina pectoris: Secondary | ICD-10-CM | POA: Diagnosis not present

## 2016-06-04 DIAGNOSIS — E11311 Type 2 diabetes mellitus with unspecified diabetic retinopathy with macular edema: Secondary | ICD-10-CM | POA: Diagnosis not present

## 2016-06-04 DIAGNOSIS — N183 Chronic kidney disease, stage 3 (moderate): Secondary | ICD-10-CM | POA: Diagnosis not present

## 2016-06-04 DIAGNOSIS — I129 Hypertensive chronic kidney disease with stage 1 through stage 4 chronic kidney disease, or unspecified chronic kidney disease: Secondary | ICD-10-CM | POA: Diagnosis not present

## 2016-06-04 DIAGNOSIS — E1139 Type 2 diabetes mellitus with other diabetic ophthalmic complication: Secondary | ICD-10-CM | POA: Diagnosis not present

## 2016-06-05 DIAGNOSIS — H548 Legal blindness, as defined in USA: Secondary | ICD-10-CM | POA: Diagnosis not present

## 2016-06-05 DIAGNOSIS — I2510 Atherosclerotic heart disease of native coronary artery without angina pectoris: Secondary | ICD-10-CM | POA: Diagnosis not present

## 2016-06-05 DIAGNOSIS — E1122 Type 2 diabetes mellitus with diabetic chronic kidney disease: Secondary | ICD-10-CM | POA: Diagnosis not present

## 2016-06-05 DIAGNOSIS — E11311 Type 2 diabetes mellitus with unspecified diabetic retinopathy with macular edema: Secondary | ICD-10-CM | POA: Diagnosis not present

## 2016-06-05 DIAGNOSIS — I69354 Hemiplegia and hemiparesis following cerebral infarction affecting left non-dominant side: Secondary | ICD-10-CM | POA: Diagnosis not present

## 2016-06-05 DIAGNOSIS — N183 Chronic kidney disease, stage 3 (moderate): Secondary | ICD-10-CM | POA: Diagnosis not present

## 2016-06-05 DIAGNOSIS — I69320 Aphasia following cerebral infarction: Secondary | ICD-10-CM | POA: Diagnosis not present

## 2016-06-05 DIAGNOSIS — I129 Hypertensive chronic kidney disease with stage 1 through stage 4 chronic kidney disease, or unspecified chronic kidney disease: Secondary | ICD-10-CM | POA: Diagnosis not present

## 2016-06-05 DIAGNOSIS — E1139 Type 2 diabetes mellitus with other diabetic ophthalmic complication: Secondary | ICD-10-CM | POA: Diagnosis not present

## 2016-06-06 ENCOUNTER — Telehealth: Payer: Self-pay

## 2016-06-06 DIAGNOSIS — I2510 Atherosclerotic heart disease of native coronary artery without angina pectoris: Secondary | ICD-10-CM | POA: Diagnosis not present

## 2016-06-06 DIAGNOSIS — N183 Chronic kidney disease, stage 3 (moderate): Secondary | ICD-10-CM | POA: Diagnosis not present

## 2016-06-06 DIAGNOSIS — E1122 Type 2 diabetes mellitus with diabetic chronic kidney disease: Secondary | ICD-10-CM | POA: Diagnosis not present

## 2016-06-06 DIAGNOSIS — I69320 Aphasia following cerebral infarction: Secondary | ICD-10-CM | POA: Diagnosis not present

## 2016-06-06 DIAGNOSIS — I129 Hypertensive chronic kidney disease with stage 1 through stage 4 chronic kidney disease, or unspecified chronic kidney disease: Secondary | ICD-10-CM | POA: Diagnosis not present

## 2016-06-06 DIAGNOSIS — E11311 Type 2 diabetes mellitus with unspecified diabetic retinopathy with macular edema: Secondary | ICD-10-CM | POA: Diagnosis not present

## 2016-06-06 DIAGNOSIS — E1139 Type 2 diabetes mellitus with other diabetic ophthalmic complication: Secondary | ICD-10-CM | POA: Diagnosis not present

## 2016-06-06 DIAGNOSIS — H548 Legal blindness, as defined in USA: Secondary | ICD-10-CM | POA: Diagnosis not present

## 2016-06-06 DIAGNOSIS — I69354 Hemiplegia and hemiparesis following cerebral infarction affecting left non-dominant side: Secondary | ICD-10-CM | POA: Diagnosis not present

## 2016-06-06 MED ORDER — CLOPIDOGREL BISULFATE 75 MG PO TABS: 75.0000 mg | ORAL_TABLET | Freq: Every day | ORAL | 1 refills | Status: AC

## 2016-06-06 MED ORDER — LINAGLIPTIN 5 MG PO TABS: 5.0000 mg | ORAL_TABLET | Freq: Every day | ORAL | 1 refills | Status: DC

## 2016-06-06 MED ORDER — INSULIN ASPART 100 UNIT/ML ~~LOC~~ SOLN: 0.0000 [IU] | Freq: Three times a day (TID) | SUBCUTANEOUS | 5 refills | Status: DC

## 2016-06-06 MED ORDER — HYDRALAZINE HCL 10 MG PO TABS: 10.0000 mg | ORAL_TABLET | Freq: Three times a day (TID) | ORAL | 1 refills | Status: DC

## 2016-06-06 NOTE — Telephone Encounter (Signed)
[facility] nurse called and stated that there is a discrepancy in the med list the patient has and their list.   Meds rx'ed to [facility].  Verbal okay for SN 1 time a wk for 5 wks.

## 2016-06-09 DIAGNOSIS — N183 Chronic kidney disease, stage 3 (moderate): Secondary | ICD-10-CM | POA: Diagnosis not present

## 2016-06-09 DIAGNOSIS — I69354 Hemiplegia and hemiparesis following cerebral infarction affecting left non-dominant side: Secondary | ICD-10-CM | POA: Diagnosis not present

## 2016-06-09 DIAGNOSIS — I2510 Atherosclerotic heart disease of native coronary artery without angina pectoris: Secondary | ICD-10-CM | POA: Diagnosis not present

## 2016-06-09 DIAGNOSIS — I69320 Aphasia following cerebral infarction: Secondary | ICD-10-CM | POA: Diagnosis not present

## 2016-06-09 DIAGNOSIS — H548 Legal blindness, as defined in USA: Secondary | ICD-10-CM | POA: Diagnosis not present

## 2016-06-09 DIAGNOSIS — E11311 Type 2 diabetes mellitus with unspecified diabetic retinopathy with macular edema: Secondary | ICD-10-CM | POA: Diagnosis not present

## 2016-06-09 DIAGNOSIS — I129 Hypertensive chronic kidney disease with stage 1 through stage 4 chronic kidney disease, or unspecified chronic kidney disease: Secondary | ICD-10-CM | POA: Diagnosis not present

## 2016-06-09 DIAGNOSIS — E1122 Type 2 diabetes mellitus with diabetic chronic kidney disease: Secondary | ICD-10-CM | POA: Diagnosis not present

## 2016-06-09 DIAGNOSIS — E1139 Type 2 diabetes mellitus with other diabetic ophthalmic complication: Secondary | ICD-10-CM | POA: Diagnosis not present

## 2016-06-11 DIAGNOSIS — I69354 Hemiplegia and hemiparesis following cerebral infarction affecting left non-dominant side: Secondary | ICD-10-CM | POA: Diagnosis not present

## 2016-06-11 DIAGNOSIS — I129 Hypertensive chronic kidney disease with stage 1 through stage 4 chronic kidney disease, or unspecified chronic kidney disease: Secondary | ICD-10-CM | POA: Diagnosis not present

## 2016-06-11 DIAGNOSIS — E1139 Type 2 diabetes mellitus with other diabetic ophthalmic complication: Secondary | ICD-10-CM | POA: Diagnosis not present

## 2016-06-11 DIAGNOSIS — E11311 Type 2 diabetes mellitus with unspecified diabetic retinopathy with macular edema: Secondary | ICD-10-CM | POA: Diagnosis not present

## 2016-06-11 DIAGNOSIS — H548 Legal blindness, as defined in USA: Secondary | ICD-10-CM | POA: Diagnosis not present

## 2016-06-11 DIAGNOSIS — N183 Chronic kidney disease, stage 3 (moderate): Secondary | ICD-10-CM | POA: Diagnosis not present

## 2016-06-11 DIAGNOSIS — I69320 Aphasia following cerebral infarction: Secondary | ICD-10-CM | POA: Diagnosis not present

## 2016-06-11 DIAGNOSIS — E1122 Type 2 diabetes mellitus with diabetic chronic kidney disease: Secondary | ICD-10-CM | POA: Diagnosis not present

## 2016-06-11 DIAGNOSIS — I2510 Atherosclerotic heart disease of native coronary artery without angina pectoris: Secondary | ICD-10-CM | POA: Diagnosis not present

## 2016-06-12 DIAGNOSIS — I129 Hypertensive chronic kidney disease with stage 1 through stage 4 chronic kidney disease, or unspecified chronic kidney disease: Secondary | ICD-10-CM | POA: Diagnosis not present

## 2016-06-12 DIAGNOSIS — E11311 Type 2 diabetes mellitus with unspecified diabetic retinopathy with macular edema: Secondary | ICD-10-CM | POA: Diagnosis not present

## 2016-06-12 DIAGNOSIS — I69320 Aphasia following cerebral infarction: Secondary | ICD-10-CM | POA: Diagnosis not present

## 2016-06-12 DIAGNOSIS — E1139 Type 2 diabetes mellitus with other diabetic ophthalmic complication: Secondary | ICD-10-CM | POA: Diagnosis not present

## 2016-06-12 DIAGNOSIS — N183 Chronic kidney disease, stage 3 (moderate): Secondary | ICD-10-CM | POA: Diagnosis not present

## 2016-06-12 DIAGNOSIS — I69354 Hemiplegia and hemiparesis following cerebral infarction affecting left non-dominant side: Secondary | ICD-10-CM | POA: Diagnosis not present

## 2016-06-12 DIAGNOSIS — I2510 Atherosclerotic heart disease of native coronary artery without angina pectoris: Secondary | ICD-10-CM | POA: Diagnosis not present

## 2016-06-12 DIAGNOSIS — H548 Legal blindness, as defined in USA: Secondary | ICD-10-CM | POA: Diagnosis not present

## 2016-06-12 DIAGNOSIS — E1122 Type 2 diabetes mellitus with diabetic chronic kidney disease: Secondary | ICD-10-CM | POA: Diagnosis not present

## 2016-06-13 ENCOUNTER — Encounter: Payer: Self-pay | Admitting: Internal Medicine

## 2016-06-13 ENCOUNTER — Telehealth: Payer: Self-pay | Admitting: Internal Medicine

## 2016-06-13 ENCOUNTER — Ambulatory Visit (INDEPENDENT_AMBULATORY_CARE_PROVIDER_SITE_OTHER): Payer: Medicare HMO | Admitting: Internal Medicine

## 2016-06-13 DIAGNOSIS — I119 Hypertensive heart disease without heart failure: Secondary | ICD-10-CM | POA: Diagnosis not present

## 2016-06-13 DIAGNOSIS — E11311 Type 2 diabetes mellitus with unspecified diabetic retinopathy with macular edema: Secondary | ICD-10-CM | POA: Diagnosis not present

## 2016-06-13 DIAGNOSIS — N184 Chronic kidney disease, stage 4 (severe): Secondary | ICD-10-CM

## 2016-06-13 DIAGNOSIS — I129 Hypertensive chronic kidney disease with stage 1 through stage 4 chronic kidney disease, or unspecified chronic kidney disease: Secondary | ICD-10-CM | POA: Diagnosis not present

## 2016-06-13 DIAGNOSIS — K5900 Constipation, unspecified: Secondary | ICD-10-CM | POA: Diagnosis not present

## 2016-06-13 DIAGNOSIS — I10 Essential (primary) hypertension: Secondary | ICD-10-CM | POA: Diagnosis not present

## 2016-06-13 DIAGNOSIS — E785 Hyperlipidemia, unspecified: Secondary | ICD-10-CM | POA: Diagnosis not present

## 2016-06-13 DIAGNOSIS — I25110 Atherosclerotic heart disease of native coronary artery with unstable angina pectoris: Secondary | ICD-10-CM | POA: Diagnosis not present

## 2016-06-13 DIAGNOSIS — K219 Gastro-esophageal reflux disease without esophagitis: Secondary | ICD-10-CM

## 2016-06-13 DIAGNOSIS — N183 Chronic kidney disease, stage 3 (moderate): Secondary | ICD-10-CM | POA: Diagnosis not present

## 2016-06-13 DIAGNOSIS — H5316 Psychophysical visual disturbances: Secondary | ICD-10-CM | POA: Diagnosis not present

## 2016-06-13 DIAGNOSIS — E1142 Type 2 diabetes mellitus with diabetic polyneuropathy: Secondary | ICD-10-CM | POA: Diagnosis not present

## 2016-06-13 DIAGNOSIS — H548 Legal blindness, as defined in USA: Secondary | ICD-10-CM | POA: Diagnosis not present

## 2016-06-13 DIAGNOSIS — E669 Obesity, unspecified: Secondary | ICD-10-CM

## 2016-06-13 DIAGNOSIS — I2510 Atherosclerotic heart disease of native coronary artery without angina pectoris: Secondary | ICD-10-CM | POA: Diagnosis not present

## 2016-06-13 DIAGNOSIS — E1122 Type 2 diabetes mellitus with diabetic chronic kidney disease: Secondary | ICD-10-CM | POA: Diagnosis not present

## 2016-06-13 DIAGNOSIS — E1139 Type 2 diabetes mellitus with other diabetic ophthalmic complication: Secondary | ICD-10-CM | POA: Diagnosis not present

## 2016-06-13 DIAGNOSIS — I69354 Hemiplegia and hemiparesis following cerebral infarction affecting left non-dominant side: Secondary | ICD-10-CM | POA: Diagnosis not present

## 2016-06-13 DIAGNOSIS — I69320 Aphasia following cerebral infarction: Secondary | ICD-10-CM | POA: Diagnosis not present

## 2016-06-13 MED ORDER — REPAGLINIDE 1 MG PO TABS: 1.0000 mg | ORAL_TABLET | Freq: Three times a day (TID) | ORAL | 11 refills | Status: DC

## 2016-06-13 NOTE — Assessment & Plan Note (Signed)
Cont w/ASA, Cozaar, Toprol

## 2016-06-13 NOTE — Assessment & Plan Note (Signed)
Prandin added. D/c insulin - pt is unable to use it Tradjenta if not too $$$ Labs in 3 mo

## 2016-06-13 NOTE — Assessment & Plan Note (Signed)
  Lovastatin, ASA 

## 2016-06-13 NOTE — Assessment & Plan Note (Signed)
Lovastatin 

## 2016-06-13 NOTE — Assessment & Plan Note (Signed)
Wt Readings from Last 3 Encounters:  06/13/16 203 lb (92.1 kg)  05/25/16 201 lb (91.2 kg)  05/18/16 199 lb 3.2 oz (90.4 kg)

## 2016-06-13 NOTE — Assessment & Plan Note (Signed)
On Tradjenta, off Glimeperide Prandin added. D/c insulin - pt is unable to use it Tradjenta if not too $$$ Labs in 3 mo

## 2016-06-13 NOTE — Telephone Encounter (Signed)
FYI

## 2016-06-13 NOTE — Assessment & Plan Note (Signed)
Linzess prn 

## 2016-06-13 NOTE — Assessment & Plan Note (Signed)
Omeprazole

## 2016-06-13 NOTE — Telephone Encounter (Signed)
Can they send a SW to see the pt? Insulin was d/c'd Thx

## 2016-06-13 NOTE — Assessment & Plan Note (Signed)
No relapse 

## 2016-06-13 NOTE — Assessment & Plan Note (Signed)
Cozaar, Toprol

## 2016-06-13 NOTE — Assessment & Plan Note (Signed)
Monitor labs 

## 2016-06-13 NOTE — Telephone Encounter (Signed)
The pts home health nurse called to let Dr Alain Marion know that the pt is not taking his medication like he should. She said that the family is being non-complaint and not caring for him like they should. She said that she will go over to check on him and his pill boxes are empty and it does not appear that the family is refilling his medications like they should. He is not using his insulin because he is blind and can not read the number to know how much he should give him self. His blood sugar is only checked on Tuesday and Saturday while the daughter is there with him. Otherwise he is there mostly alone and this concerns her. She wanted Dr Alain Marion to be aware of what she has observed.

## 2016-06-13 NOTE — Progress Notes (Signed)
Subjective:  Patient ID: Caleb Dunn., male    DOB: 09-Jul-1940  Age: 76 y.o. MRN: [id number]  CC: No chief complaint on file.   HPI Caleb Dunn. presents for DM, HTN, CRF f/u. He was d/c on insulin - no one at home to give it...  Outpatient Medications Prior to Visit  Medication Sig Dispense Refill  . aspirin EC 325 MG EC tablet Take 1 tablet (325 mg total) by mouth daily. 30 tablet 0  . cholecalciferol (VITAMIN D) 1000 units tablet Take 1 tablet (1,000 Units total) by mouth daily. 100 tablet 3  . clopidogrel (PLAVIX) 75 MG tablet Take 1 tablet (75 mg total) by mouth daily. 90 tablet 1  . finasteride (PROSCAR) 5 MG tablet Take 1 tablet (5 mg total) by mouth daily. For Prostate 90 tablet 3  . FLUoxetine (PROZAC) 20 MG tablet Take 1 tablet (20 mg total) by mouth daily. 30 tablet 5  . hydrALAZINE (APRESOLINE) 10 MG tablet Take 1 tablet (10 mg total) by mouth every 8 (eight) hours. 90 tablet 1  . insulin aspart (NOVOLOG) 100 UNIT/ML injection Inject 0-9 Units into the skin 3 (three) times daily with meals. 10 mL 5  . linaclotide (LINZESS) 290 MCG CAPS capsule Take 1 capsule (290 mcg total) by mouth daily. 30 capsule 1  . linagliptin (TRADJENTA) 5 MG TABS tablet Take 1 tablet (5 mg total) by mouth daily. 90 tablet 1  . lovastatin (MEVACOR) 20 MG tablet Take 1 tablet (20 mg total) by mouth daily. For Cholesterol 90 tablet 3  . metoprolol succinate (TOPROL-XL) 25 MG 24 hr tablet Take 3 tablets (75 mg total) by mouth daily. Take with or immediately following a meal. 30 tablet 1  . pantoprazole (PROTONIX) 40 MG tablet Take 1 tablet (40 mg total) by mouth daily. 30 tablet 0  . Skin Protectants, Misc. (EUCERIN) cream Apply 1 application topically 2 (two) times daily as needed for dry skin.     No facility-administered medications prior to visit.     ROS Review of Systems  Constitutional: Negative for appetite change, fatigue and unexpected weight change.  HENT: Negative for  congestion, nosebleeds, sneezing, sore throat and trouble swallowing.   Eyes: Positive for visual disturbance. Negative for itching.  Respiratory: Negative for cough.   Cardiovascular: Negative for chest pain, palpitations and leg swelling.  Gastrointestinal: Negative for abdominal distention, blood in stool, diarrhea and nausea.  Genitourinary: Negative for frequency and hematuria.  Musculoskeletal: Positive for arthralgias. Negative for back pain, gait problem, joint swelling and neck pain.  Skin: Negative for rash.  Neurological: Negative for dizziness, tremors, speech difficulty and weakness.  Psychiatric/Behavioral: Positive for decreased concentration. Negative for agitation, dysphoric mood, sleep disturbance and suicidal ideas. The patient is not nervous/anxious.     Objective:  BP 132/84 (BP Location: Left Arm, Patient Position: Sitting, Cuff Size: Large)   Pulse 74   Temp 97.7 F (36.5 C) (Oral)   Ht 5\' 6"  (1.676 m)   Wt 203 lb (92.1 kg)   SpO2 98%   BMI 32.77 kg/m   BP Readings from Last 3 Encounters:  06/13/16 132/84  05/25/16 135/80  05/18/16 125/69    Wt Readings from Last 3 Encounters:  06/13/16 203 lb (92.1 kg)  05/25/16 201 lb (91.2 kg)  05/18/16 199 lb 3.2 oz (90.4 kg)    Physical Exam  Constitutional: He is oriented to person, place, and time. He appears well-developed. No distress.  NAD  HENT:  Mouth/Throat:  Oropharynx is clear and moist.  Eyes: Conjunctivae are normal. Pupils are equal, round, and reactive to light.  Neck: Normal range of motion. No JVD present. No thyromegaly present.  Cardiovascular: Normal rate, regular rhythm, normal heart sounds and intact distal pulses.  Exam reveals no gallop and no friction rub.   No murmur heard. Pulmonary/Chest: Effort normal and breath sounds normal. No respiratory distress. He has no wheezes. He has no rales. He exhibits no tenderness.  Abdominal: Soft. Bowel sounds are normal. He exhibits no distension and  no mass. There is no tenderness. There is no rebound and no guarding.  Musculoskeletal: Normal range of motion. He exhibits no edema or tenderness.  Lymphadenopathy:    He has no cervical adenopathy.  Neurological: He is alert and oriented to person, place, and time. He has normal reflexes. No cranial nerve deficit. He exhibits normal muscle tone. He displays a negative Romberg sign. Coordination and gait normal.  Skin: Skin is warm and dry. No rash noted.  Psychiatric: He has a normal mood and affect. His behavior is normal. Judgment and thought content normal.  blind  Lab Results  Component Value Date   WBC 5.9 05/16/2016   HGB 12.4 (L) 05/16/2016   HCT 38.4 (L) 05/16/2016   PLT 170 05/16/2016   GLUCOSE 177 (H) 05/16/2016   CHOL 131 05/16/2016   TRIG 117 05/16/2016   HDL 36 (L) 05/16/2016   LDLCALC 72 05/16/2016   ALT 22 03/19/2016   AST 17 03/19/2016   NA 138 05/16/2016   K 4.2 05/16/2016   CL 106 05/16/2016   CREATININE 1.86 (H) 05/16/2016   BUN 22 (H) 05/16/2016   CO2 25 05/16/2016   TSH 0.80 08/13/2011   PSA 8.93 (H) 11/23/2014   INR 1.06 03/13/2016   HGBA1C 8.5 (H) 05/16/2016    Dg Chest 2 View  Result Date: 05/15/2016 CLINICAL DATA:  Gait imbalance beginning last night. EXAM: CHEST  2 VIEW COMPARISON:  Chest radiograph March 21, 2016 FINDINGS: Cardiac silhouette is upper limits of normal in size, status post median sternotomy for CABG. No pleural effusion or focal consolidation. No pneumothorax. Soft tissue planes and included osseous structures are nonsuspicious. IMPRESSION: Stable examination: Borderline cardiomegaly, no acute pulmonary process. Electronically Signed   By: Elon Alas M.D.   On: 05/15/2016 14:58   Ct Head Wo Contrast  Result Date: 05/15/2016 CLINICAL DATA:  Gait imbalance beginning last night. Unwitnessed fall 2 days ago. History of diabetes, hypertension and stroke. EXAM: CT HEAD WITHOUT CONTRAST CT CERVICAL SPINE WITHOUT CONTRAST  TECHNIQUE: Multidetector CT imaging of the head and cervical spine was performed following the standard protocol without intravenous contrast. Multiplanar CT image reconstructions of the cervical spine were also generated. COMPARISON:  MRI of the head March 13, 2016 FINDINGS: CT HEAD FINDINGS BRAIN: No intraparenchymal hemorrhage, mass effect nor midline shift. Old LEFT basal ganglia lacunar infarct with mild ex vacuo dilatation of the frontal horn of the lateral ventricles. Moderate to severe ventriculomegaly on the basis of global parenchymal brain volume loss. Old LEFT thalamus lacunar infarct. Small old LEFT cerebellar infarcts. Patchy supratentorial white matter hypodensities within normal range for patient's age, though non-specific are most compatible with chronic small vessel ischemic disease. No acute large vascular territory infarcts. No abnormal extra-axial fluid collections. Basal cisterns are patent. VASCULAR: Moderate calcific atherosclerosis of the carotid siphons. SKULL: No skull fracture. No significant scalp soft tissue swelling. Small nodule RIGHT frontal scalp, recommend direct inspection. SINUSES/ORBITS: The mastoid air-cells  and included paranasal sinuses are well-aerated. LEFT frontal osteoma. The included ocular globes and orbital contents are non-suspicious. Status post bilateral ocular lens implants with RIGHT glaucoma drainage device. OTHER: None. CT CERVICAL SPINE FINDINGS ALIGNMENT: Straightened lordosis. Vertebral bodies in alignment. SKULL BASE AND VERTEBRAE: Cervical vertebral bodies and posterior elements are intact. Moderate to severe C4-5 and C5-6 disc height loss with endplate spurring and uncovertebral hypertrophy compatible with degenerative discs. C1-2 articulation maintained with moderate arthropathy. Borderline congenital canal narrowing on the basis of foreshortened pedicles. SOFT TISSUES AND SPINAL CANAL: Nonacute. Moderate calcific atherosclerosis carotid bifurcations.  DISC LEVELS: Moderate RIGHT C3-4, moderate to severe C4-5, moderate C5-6 neural foraminal narrowing. UPPER CHEST: Lung apices are clear. OTHER: None. IMPRESSION: CT HEAD: No acute intracranial process. Stable examination including moderate to severe global parenchymal brain volume loss and multiple old small infarcts. CT CERVICAL SPINE: No acute fracture or malalignment. Moderate to severe C4-5 neural foraminal narrowing. Electronically Signed   By: Elon Alas M.D.   On: 05/15/2016 15:07   Ct Cervical Spine Wo Contrast  Result Date: 05/15/2016 CLINICAL DATA:  Gait imbalance beginning last night. Unwitnessed fall 2 days ago. History of diabetes, hypertension and stroke. EXAM: CT HEAD WITHOUT CONTRAST CT CERVICAL SPINE WITHOUT CONTRAST TECHNIQUE: Multidetector CT imaging of the head and cervical spine was performed following the standard protocol without intravenous contrast. Multiplanar CT image reconstructions of the cervical spine were also generated. COMPARISON:  MRI of the head March 13, 2016 FINDINGS: CT HEAD FINDINGS BRAIN: No intraparenchymal hemorrhage, mass effect nor midline shift. Old LEFT basal ganglia lacunar infarct with mild ex vacuo dilatation of the frontal horn of the lateral ventricles. Moderate to severe ventriculomegaly on the basis of global parenchymal brain volume loss. Old LEFT thalamus lacunar infarct. Small old LEFT cerebellar infarcts. Patchy supratentorial white matter hypodensities within normal range for patient's age, though non-specific are most compatible with chronic small vessel ischemic disease. No acute large vascular territory infarcts. No abnormal extra-axial fluid collections. Basal cisterns are patent. VASCULAR: Moderate calcific atherosclerosis of the carotid siphons. SKULL: No skull fracture. No significant scalp soft tissue swelling. Small nodule RIGHT frontal scalp, recommend direct inspection. SINUSES/ORBITS: The mastoid air-cells and included paranasal  sinuses are well-aerated. LEFT frontal osteoma. The included ocular globes and orbital contents are non-suspicious. Status post bilateral ocular lens implants with RIGHT glaucoma drainage device. OTHER: None. CT CERVICAL SPINE FINDINGS ALIGNMENT: Straightened lordosis. Vertebral bodies in alignment. SKULL BASE AND VERTEBRAE: Cervical vertebral bodies and posterior elements are intact. Moderate to severe C4-5 and C5-6 disc height loss with endplate spurring and uncovertebral hypertrophy compatible with degenerative discs. C1-2 articulation maintained with moderate arthropathy. Borderline congenital canal narrowing on the basis of foreshortened pedicles. SOFT TISSUES AND SPINAL CANAL: Nonacute. Moderate calcific atherosclerosis carotid bifurcations. DISC LEVELS: Moderate RIGHT C3-4, moderate to severe C4-5, moderate C5-6 neural foraminal narrowing. UPPER CHEST: Lung apices are clear. OTHER: None. IMPRESSION: CT HEAD: No acute intracranial process. Stable examination including moderate to severe global parenchymal brain volume loss and multiple old small infarcts. CT CERVICAL SPINE: No acute fracture or malalignment. Moderate to severe C4-5 neural foraminal narrowing. Electronically Signed   By: Elon Alas M.D.   On: 05/15/2016 15:07   [name] Head Wo Contrast  Result Date: 05/16/2016 CLINICAL DATA:  Acute presentation with balance disturbance and left cerebellar infarction. EXAM: MRA HEAD WITHOUT CONTRAST TECHNIQUE: Angiographic images of the Circle of Willis were obtained using MRA technique without intravenous contrast. COMPARISON:  05/15/2016 FINDINGS: Both  internal carotid arteries are widely patent into the brain. The anterior and middle cerebral vessels are normal without proximal stenosis, aneurysm or vascular malformation. Both vertebral arteries are widely patent to the basilar. No basilar stenosis. Dominant PICA, the origin of which is not included in the region. Anterior inferior cerebellar  arteries patent bilaterally, larger on the right. Superior cerebellar and posterior cerebral arteries patent and normal. IMPRESSION: Within normal limits. No large or medium vessel stenosis or occlusion. Electronically Signed   By: Nelson Chimes M.D.   On: 05/16/2016 11:37   Mr Brain Wo Contrast  Result Date: 05/15/2016 CLINICAL DATA:  Unwitnessed fall in new subjective balance deficits. EXAM: MRI HEAD WITHOUT CONTRAST TECHNIQUE: Multiplanar, multiecho pulse sequences of the brain and surrounding structures were obtained without intravenous contrast. COMPARISON:  03/13/2016 MRI of the head. FINDINGS: Brain: Subcentimeter focus of diffusion restriction within the lateral left cerebellar hemisphere compatible with acute/ early subacute infarction. No additional diffusion signal abnormality of the brain. Stable foci of T2 FLAIR hyperintense signal abnormality within subcortical and periventricular white matter as well as the pons compatible with chronic microvascular ischemic changes. Stable brain parenchymal volume loss. Stable small chronic lacunar infarctions within the left thalamus and left cerebellar hemisphere. No new susceptibility hypointensity to indicate intracranial hemorrhage. Ventricle size is commensurate to the degree of volume loss. No focal mass effect. No extra-axial collection. No effacement of basilar cisterns. Vascular: Normal flow voids. Skull and upper cervical spine: Normal marrow signal. Sinuses/Orbits: Negative. Other: None. IMPRESSION: 1. Subcentimeter acute/ early subacute infarction within the left lateral cerebellar hemisphere. No acute hemorrhage identified. 2. Stable chronic microvascular ischemic changes and parenchymal volume loss of the brain. These results were called by telephone at the time of interpretation on 05/15/2016 at 6:38 pm to [place], who verbally acknowledged these results. Electronically Signed   By: Kristine Garbe M.D.   On: 05/15/2016 18:41     Assessment & Plan:   There are no diagnoses linked to this encounter. I am having Mr. Basnett maintain his aspirin, cholecalciferol, finasteride, FLUoxetine, linaclotide, lovastatin, metoprolol succinate, pantoprazole, eucerin, clopidogrel, hydrALAZINE, insulin aspart, and linagliptin.  No orders of the defined types were placed in this encounter.    Follow-up: No Follow-up on file.  Walker Kehr, MD

## 2016-06-14 ENCOUNTER — Encounter: Payer: Medicare HMO | Attending: Physical Medicine & Rehabilitation | Admitting: Physical Medicine & Rehabilitation

## 2016-06-14 DIAGNOSIS — G5600 Carpal tunnel syndrome, unspecified upper limb: Secondary | ICD-10-CM | POA: Insufficient documentation

## 2016-06-14 DIAGNOSIS — Z9049 Acquired absence of other specified parts of digestive tract: Secondary | ICD-10-CM | POA: Insufficient documentation

## 2016-06-14 DIAGNOSIS — Z87891 Personal history of nicotine dependence: Secondary | ICD-10-CM | POA: Insufficient documentation

## 2016-06-14 DIAGNOSIS — Z8249 Family history of ischemic heart disease and other diseases of the circulatory system: Secondary | ICD-10-CM | POA: Insufficient documentation

## 2016-06-14 DIAGNOSIS — H409 Unspecified glaucoma: Secondary | ICD-10-CM | POA: Insufficient documentation

## 2016-06-14 DIAGNOSIS — Z841 Family history of disorders of kidney and ureter: Secondary | ICD-10-CM | POA: Insufficient documentation

## 2016-06-14 DIAGNOSIS — E11311 Type 2 diabetes mellitus with unspecified diabetic retinopathy with macular edema: Secondary | ICD-10-CM | POA: Diagnosis not present

## 2016-06-14 DIAGNOSIS — I69320 Aphasia following cerebral infarction: Secondary | ICD-10-CM | POA: Diagnosis not present

## 2016-06-14 DIAGNOSIS — E1142 Type 2 diabetes mellitus with diabetic polyneuropathy: Secondary | ICD-10-CM | POA: Insufficient documentation

## 2016-06-14 DIAGNOSIS — N183 Chronic kidney disease, stage 3 (moderate): Secondary | ICD-10-CM | POA: Insufficient documentation

## 2016-06-14 DIAGNOSIS — H548 Legal blindness, as defined in USA: Secondary | ICD-10-CM | POA: Diagnosis not present

## 2016-06-14 DIAGNOSIS — I252 Old myocardial infarction: Secondary | ICD-10-CM | POA: Insufficient documentation

## 2016-06-14 DIAGNOSIS — I638 Other cerebral infarction: Secondary | ICD-10-CM | POA: Insufficient documentation

## 2016-06-14 DIAGNOSIS — E1139 Type 2 diabetes mellitus with other diabetic ophthalmic complication: Secondary | ICD-10-CM | POA: Diagnosis not present

## 2016-06-14 DIAGNOSIS — I2510 Atherosclerotic heart disease of native coronary artery without angina pectoris: Secondary | ICD-10-CM | POA: Diagnosis not present

## 2016-06-14 DIAGNOSIS — I129 Hypertensive chronic kidney disease with stage 1 through stage 4 chronic kidney disease, or unspecified chronic kidney disease: Secondary | ICD-10-CM | POA: Insufficient documentation

## 2016-06-14 DIAGNOSIS — K5900 Constipation, unspecified: Secondary | ICD-10-CM | POA: Insufficient documentation

## 2016-06-14 DIAGNOSIS — E1122 Type 2 diabetes mellitus with diabetic chronic kidney disease: Secondary | ICD-10-CM | POA: Diagnosis not present

## 2016-06-14 DIAGNOSIS — K219 Gastro-esophageal reflux disease without esophagitis: Secondary | ICD-10-CM | POA: Insufficient documentation

## 2016-06-14 DIAGNOSIS — I69351 Hemiplegia and hemiparesis following cerebral infarction affecting right dominant side: Secondary | ICD-10-CM | POA: Insufficient documentation

## 2016-06-14 DIAGNOSIS — I69354 Hemiplegia and hemiparesis following cerebral infarction affecting left non-dominant side: Secondary | ICD-10-CM | POA: Diagnosis not present

## 2016-06-14 DIAGNOSIS — Z833 Family history of diabetes mellitus: Secondary | ICD-10-CM | POA: Insufficient documentation

## 2016-06-14 NOTE — Telephone Encounter (Signed)
Contacted nurse and informed her and im waiting for a call back to further discuss this because she was at a clients house.

## 2016-06-15 DIAGNOSIS — I69354 Hemiplegia and hemiparesis following cerebral infarction affecting left non-dominant side: Secondary | ICD-10-CM | POA: Diagnosis not present

## 2016-06-15 DIAGNOSIS — E1122 Type 2 diabetes mellitus with diabetic chronic kidney disease: Secondary | ICD-10-CM | POA: Diagnosis not present

## 2016-06-15 DIAGNOSIS — E11311 Type 2 diabetes mellitus with unspecified diabetic retinopathy with macular edema: Secondary | ICD-10-CM | POA: Diagnosis not present

## 2016-06-15 DIAGNOSIS — I69320 Aphasia following cerebral infarction: Secondary | ICD-10-CM | POA: Diagnosis not present

## 2016-06-15 DIAGNOSIS — E1139 Type 2 diabetes mellitus with other diabetic ophthalmic complication: Secondary | ICD-10-CM | POA: Diagnosis not present

## 2016-06-15 DIAGNOSIS — I129 Hypertensive chronic kidney disease with stage 1 through stage 4 chronic kidney disease, or unspecified chronic kidney disease: Secondary | ICD-10-CM | POA: Diagnosis not present

## 2016-06-15 DIAGNOSIS — N183 Chronic kidney disease, stage 3 (moderate): Secondary | ICD-10-CM | POA: Diagnosis not present

## 2016-06-15 DIAGNOSIS — I2510 Atherosclerotic heart disease of native coronary artery without angina pectoris: Secondary | ICD-10-CM | POA: Diagnosis not present

## 2016-06-15 DIAGNOSIS — H548 Legal blindness, as defined in USA: Secondary | ICD-10-CM | POA: Diagnosis not present

## 2016-06-19 ENCOUNTER — Telehealth: Payer: Self-pay

## 2016-06-19 DIAGNOSIS — I69320 Aphasia following cerebral infarction: Secondary | ICD-10-CM | POA: Diagnosis not present

## 2016-06-19 DIAGNOSIS — E1139 Type 2 diabetes mellitus with other diabetic ophthalmic complication: Secondary | ICD-10-CM | POA: Diagnosis not present

## 2016-06-19 DIAGNOSIS — N183 Chronic kidney disease, stage 3 (moderate): Secondary | ICD-10-CM | POA: Diagnosis not present

## 2016-06-19 DIAGNOSIS — E1122 Type 2 diabetes mellitus with diabetic chronic kidney disease: Secondary | ICD-10-CM | POA: Diagnosis not present

## 2016-06-19 DIAGNOSIS — I69354 Hemiplegia and hemiparesis following cerebral infarction affecting left non-dominant side: Secondary | ICD-10-CM | POA: Diagnosis not present

## 2016-06-19 DIAGNOSIS — I2510 Atherosclerotic heart disease of native coronary artery without angina pectoris: Secondary | ICD-10-CM | POA: Diagnosis not present

## 2016-06-19 DIAGNOSIS — I129 Hypertensive chronic kidney disease with stage 1 through stage 4 chronic kidney disease, or unspecified chronic kidney disease: Secondary | ICD-10-CM | POA: Diagnosis not present

## 2016-06-19 DIAGNOSIS — H548 Legal blindness, as defined in USA: Secondary | ICD-10-CM | POA: Diagnosis not present

## 2016-06-19 DIAGNOSIS — E11311 Type 2 diabetes mellitus with unspecified diabetic retinopathy with macular edema: Secondary | ICD-10-CM | POA: Diagnosis not present

## 2016-06-19 NOTE — Telephone Encounter (Signed)
Key: [id number]    Pt could not be verified.

## 2016-06-20 DIAGNOSIS — I129 Hypertensive chronic kidney disease with stage 1 through stage 4 chronic kidney disease, or unspecified chronic kidney disease: Secondary | ICD-10-CM | POA: Diagnosis not present

## 2016-06-20 DIAGNOSIS — E1139 Type 2 diabetes mellitus with other diabetic ophthalmic complication: Secondary | ICD-10-CM | POA: Diagnosis not present

## 2016-06-20 DIAGNOSIS — E1122 Type 2 diabetes mellitus with diabetic chronic kidney disease: Secondary | ICD-10-CM | POA: Diagnosis not present

## 2016-06-20 DIAGNOSIS — I2510 Atherosclerotic heart disease of native coronary artery without angina pectoris: Secondary | ICD-10-CM | POA: Diagnosis not present

## 2016-06-20 DIAGNOSIS — I69320 Aphasia following cerebral infarction: Secondary | ICD-10-CM | POA: Diagnosis not present

## 2016-06-20 DIAGNOSIS — N183 Chronic kidney disease, stage 3 (moderate): Secondary | ICD-10-CM | POA: Diagnosis not present

## 2016-06-20 DIAGNOSIS — H548 Legal blindness, as defined in USA: Secondary | ICD-10-CM | POA: Diagnosis not present

## 2016-06-20 DIAGNOSIS — E11311 Type 2 diabetes mellitus with unspecified diabetic retinopathy with macular edema: Secondary | ICD-10-CM | POA: Diagnosis not present

## 2016-06-20 DIAGNOSIS — I69354 Hemiplegia and hemiparesis following cerebral infarction affecting left non-dominant side: Secondary | ICD-10-CM | POA: Diagnosis not present

## 2016-06-20 NOTE — Telephone Encounter (Addendum)
Not able to verify patient.  Medication is no longer on patients medication list. Closing note.

## 2016-06-21 DIAGNOSIS — E11311 Type 2 diabetes mellitus with unspecified diabetic retinopathy with macular edema: Secondary | ICD-10-CM | POA: Diagnosis not present

## 2016-06-21 DIAGNOSIS — I69354 Hemiplegia and hemiparesis following cerebral infarction affecting left non-dominant side: Secondary | ICD-10-CM | POA: Diagnosis not present

## 2016-06-21 DIAGNOSIS — F329 Major depressive disorder, single episode, unspecified: Secondary | ICD-10-CM | POA: Diagnosis not present

## 2016-06-21 DIAGNOSIS — I129 Hypertensive chronic kidney disease with stage 1 through stage 4 chronic kidney disease, or unspecified chronic kidney disease: Secondary | ICD-10-CM | POA: Diagnosis not present

## 2016-06-21 DIAGNOSIS — H548 Legal blindness, as defined in USA: Secondary | ICD-10-CM | POA: Diagnosis not present

## 2016-06-21 DIAGNOSIS — E785 Hyperlipidemia, unspecified: Secondary | ICD-10-CM

## 2016-06-21 DIAGNOSIS — H409 Unspecified glaucoma: Secondary | ICD-10-CM | POA: Diagnosis not present

## 2016-06-21 DIAGNOSIS — I2510 Atherosclerotic heart disease of native coronary artery without angina pectoris: Secondary | ICD-10-CM | POA: Diagnosis not present

## 2016-06-21 DIAGNOSIS — N183 Chronic kidney disease, stage 3 (moderate): Secondary | ICD-10-CM | POA: Diagnosis not present

## 2016-06-21 DIAGNOSIS — Z7982 Long term (current) use of aspirin: Secondary | ICD-10-CM

## 2016-06-21 DIAGNOSIS — K219 Gastro-esophageal reflux disease without esophagitis: Secondary | ICD-10-CM

## 2016-06-21 DIAGNOSIS — E1122 Type 2 diabetes mellitus with diabetic chronic kidney disease: Secondary | ICD-10-CM | POA: Diagnosis not present

## 2016-06-21 DIAGNOSIS — K5909 Other constipation: Secondary | ICD-10-CM

## 2016-06-21 DIAGNOSIS — I252 Old myocardial infarction: Secondary | ICD-10-CM

## 2016-06-21 DIAGNOSIS — K581 Irritable bowel syndrome with constipation: Secondary | ICD-10-CM

## 2016-06-21 DIAGNOSIS — N400 Enlarged prostate without lower urinary tract symptoms: Secondary | ICD-10-CM | POA: Diagnosis not present

## 2016-06-21 DIAGNOSIS — E1139 Type 2 diabetes mellitus with other diabetic ophthalmic complication: Secondary | ICD-10-CM | POA: Diagnosis not present

## 2016-06-21 DIAGNOSIS — I69320 Aphasia following cerebral infarction: Secondary | ICD-10-CM | POA: Diagnosis not present

## 2016-06-22 DIAGNOSIS — I69354 Hemiplegia and hemiparesis following cerebral infarction affecting left non-dominant side: Secondary | ICD-10-CM | POA: Diagnosis not present

## 2016-06-22 DIAGNOSIS — N183 Chronic kidney disease, stage 3 (moderate): Secondary | ICD-10-CM | POA: Diagnosis not present

## 2016-06-22 DIAGNOSIS — I2510 Atherosclerotic heart disease of native coronary artery without angina pectoris: Secondary | ICD-10-CM | POA: Diagnosis not present

## 2016-06-22 DIAGNOSIS — E11311 Type 2 diabetes mellitus with unspecified diabetic retinopathy with macular edema: Secondary | ICD-10-CM | POA: Diagnosis not present

## 2016-06-22 DIAGNOSIS — E1122 Type 2 diabetes mellitus with diabetic chronic kidney disease: Secondary | ICD-10-CM | POA: Diagnosis not present

## 2016-06-22 DIAGNOSIS — H548 Legal blindness, as defined in USA: Secondary | ICD-10-CM | POA: Diagnosis not present

## 2016-06-22 DIAGNOSIS — E1139 Type 2 diabetes mellitus with other diabetic ophthalmic complication: Secondary | ICD-10-CM | POA: Diagnosis not present

## 2016-06-22 DIAGNOSIS — I129 Hypertensive chronic kidney disease with stage 1 through stage 4 chronic kidney disease, or unspecified chronic kidney disease: Secondary | ICD-10-CM | POA: Diagnosis not present

## 2016-06-22 DIAGNOSIS — I69320 Aphasia following cerebral infarction: Secondary | ICD-10-CM | POA: Diagnosis not present

## 2016-06-26 DIAGNOSIS — E1139 Type 2 diabetes mellitus with other diabetic ophthalmic complication: Secondary | ICD-10-CM | POA: Diagnosis not present

## 2016-06-26 DIAGNOSIS — I69354 Hemiplegia and hemiparesis following cerebral infarction affecting left non-dominant side: Secondary | ICD-10-CM | POA: Diagnosis not present

## 2016-06-26 DIAGNOSIS — I2510 Atherosclerotic heart disease of native coronary artery without angina pectoris: Secondary | ICD-10-CM | POA: Diagnosis not present

## 2016-06-26 DIAGNOSIS — E1122 Type 2 diabetes mellitus with diabetic chronic kidney disease: Secondary | ICD-10-CM | POA: Diagnosis not present

## 2016-06-26 DIAGNOSIS — I129 Hypertensive chronic kidney disease with stage 1 through stage 4 chronic kidney disease, or unspecified chronic kidney disease: Secondary | ICD-10-CM | POA: Diagnosis not present

## 2016-06-26 DIAGNOSIS — E11311 Type 2 diabetes mellitus with unspecified diabetic retinopathy with macular edema: Secondary | ICD-10-CM | POA: Diagnosis not present

## 2016-06-26 DIAGNOSIS — H548 Legal blindness, as defined in USA: Secondary | ICD-10-CM | POA: Diagnosis not present

## 2016-06-26 DIAGNOSIS — N183 Chronic kidney disease, stage 3 (moderate): Secondary | ICD-10-CM | POA: Diagnosis not present

## 2016-06-26 DIAGNOSIS — I69320 Aphasia following cerebral infarction: Secondary | ICD-10-CM | POA: Diagnosis not present

## 2016-06-27 ENCOUNTER — Telehealth: Payer: Self-pay | Admitting: Internal Medicine

## 2016-06-27 DIAGNOSIS — I69354 Hemiplegia and hemiparesis following cerebral infarction affecting left non-dominant side: Secondary | ICD-10-CM | POA: Diagnosis not present

## 2016-06-27 DIAGNOSIS — E11311 Type 2 diabetes mellitus with unspecified diabetic retinopathy with macular edema: Secondary | ICD-10-CM | POA: Diagnosis not present

## 2016-06-27 DIAGNOSIS — E1139 Type 2 diabetes mellitus with other diabetic ophthalmic complication: Secondary | ICD-10-CM | POA: Diagnosis not present

## 2016-06-27 DIAGNOSIS — I2510 Atherosclerotic heart disease of native coronary artery without angina pectoris: Secondary | ICD-10-CM | POA: Diagnosis not present

## 2016-06-27 DIAGNOSIS — H548 Legal blindness, as defined in USA: Secondary | ICD-10-CM | POA: Diagnosis not present

## 2016-06-27 DIAGNOSIS — I69320 Aphasia following cerebral infarction: Secondary | ICD-10-CM | POA: Diagnosis not present

## 2016-06-27 DIAGNOSIS — N183 Chronic kidney disease, stage 3 (moderate): Secondary | ICD-10-CM | POA: Diagnosis not present

## 2016-06-27 DIAGNOSIS — I129 Hypertensive chronic kidney disease with stage 1 through stage 4 chronic kidney disease, or unspecified chronic kidney disease: Secondary | ICD-10-CM | POA: Diagnosis not present

## 2016-06-27 DIAGNOSIS — E1122 Type 2 diabetes mellitus with diabetic chronic kidney disease: Secondary | ICD-10-CM | POA: Diagnosis not present

## 2016-06-27 MED ORDER — METOPROLOL SUCCINATE ER 25 MG PO TB24: 75.0000 mg | ORAL_TABLET | Freq: Every day | ORAL | 1 refills | Status: DC

## 2016-06-27 NOTE — Telephone Encounter (Signed)
Pls Fluoxetine x6 mo Thx

## 2016-06-27 NOTE — Telephone Encounter (Signed)
States patient no longer wants to take protonix b/c it is causing him to have frequent bowel movements.  Patient also needs refills on metoprolol and fluoxetine. Needs to be sent to [id number] on [address].  States patient has not taken prandin since it was prescribed.  Patient also has losartan that is not on their med list.  Needs to confirm if patient needs to take this med.

## 2016-06-27 NOTE — Telephone Encounter (Signed)
Metoprolol RX sent, pharmacy has refills on fluoxetine. Please advise on rest.

## 2016-06-28 DIAGNOSIS — H548 Legal blindness, as defined in USA: Secondary | ICD-10-CM | POA: Diagnosis not present

## 2016-06-28 DIAGNOSIS — E1122 Type 2 diabetes mellitus with diabetic chronic kidney disease: Secondary | ICD-10-CM | POA: Diagnosis not present

## 2016-06-28 DIAGNOSIS — I69354 Hemiplegia and hemiparesis following cerebral infarction affecting left non-dominant side: Secondary | ICD-10-CM | POA: Diagnosis not present

## 2016-06-28 DIAGNOSIS — N183 Chronic kidney disease, stage 3 (moderate): Secondary | ICD-10-CM | POA: Diagnosis not present

## 2016-06-28 DIAGNOSIS — I2510 Atherosclerotic heart disease of native coronary artery without angina pectoris: Secondary | ICD-10-CM | POA: Diagnosis not present

## 2016-06-28 DIAGNOSIS — I69320 Aphasia following cerebral infarction: Secondary | ICD-10-CM | POA: Diagnosis not present

## 2016-06-28 DIAGNOSIS — I129 Hypertensive chronic kidney disease with stage 1 through stage 4 chronic kidney disease, or unspecified chronic kidney disease: Secondary | ICD-10-CM | POA: Diagnosis not present

## 2016-06-28 DIAGNOSIS — E11311 Type 2 diabetes mellitus with unspecified diabetic retinopathy with macular edema: Secondary | ICD-10-CM | POA: Diagnosis not present

## 2016-06-28 DIAGNOSIS — E1139 Type 2 diabetes mellitus with other diabetic ophthalmic complication: Secondary | ICD-10-CM | POA: Diagnosis not present

## 2016-06-29 NOTE — Telephone Encounter (Signed)
Noted, please advise about protonix.

## 2016-06-29 NOTE — Telephone Encounter (Signed)
OK Protonix 12 mo Thx

## 2016-07-02 DIAGNOSIS — N183 Chronic kidney disease, stage 3 (moderate): Secondary | ICD-10-CM | POA: Diagnosis not present

## 2016-07-02 DIAGNOSIS — E1139 Type 2 diabetes mellitus with other diabetic ophthalmic complication: Secondary | ICD-10-CM | POA: Diagnosis not present

## 2016-07-02 DIAGNOSIS — E1122 Type 2 diabetes mellitus with diabetic chronic kidney disease: Secondary | ICD-10-CM | POA: Diagnosis not present

## 2016-07-02 DIAGNOSIS — I69320 Aphasia following cerebral infarction: Secondary | ICD-10-CM | POA: Diagnosis not present

## 2016-07-02 DIAGNOSIS — I129 Hypertensive chronic kidney disease with stage 1 through stage 4 chronic kidney disease, or unspecified chronic kidney disease: Secondary | ICD-10-CM | POA: Diagnosis not present

## 2016-07-02 DIAGNOSIS — I2510 Atherosclerotic heart disease of native coronary artery without angina pectoris: Secondary | ICD-10-CM | POA: Diagnosis not present

## 2016-07-02 DIAGNOSIS — E11311 Type 2 diabetes mellitus with unspecified diabetic retinopathy with macular edema: Secondary | ICD-10-CM | POA: Diagnosis not present

## 2016-07-02 DIAGNOSIS — H548 Legal blindness, as defined in USA: Secondary | ICD-10-CM | POA: Diagnosis not present

## 2016-07-02 DIAGNOSIS — I69354 Hemiplegia and hemiparesis following cerebral infarction affecting left non-dominant side: Secondary | ICD-10-CM | POA: Diagnosis not present

## 2016-07-05 DIAGNOSIS — I129 Hypertensive chronic kidney disease with stage 1 through stage 4 chronic kidney disease, or unspecified chronic kidney disease: Secondary | ICD-10-CM | POA: Diagnosis not present

## 2016-07-05 DIAGNOSIS — E11311 Type 2 diabetes mellitus with unspecified diabetic retinopathy with macular edema: Secondary | ICD-10-CM | POA: Diagnosis not present

## 2016-07-05 DIAGNOSIS — I69354 Hemiplegia and hemiparesis following cerebral infarction affecting left non-dominant side: Secondary | ICD-10-CM | POA: Diagnosis not present

## 2016-07-05 DIAGNOSIS — N183 Chronic kidney disease, stage 3 (moderate): Secondary | ICD-10-CM | POA: Diagnosis not present

## 2016-07-05 DIAGNOSIS — I69320 Aphasia following cerebral infarction: Secondary | ICD-10-CM | POA: Diagnosis not present

## 2016-07-05 DIAGNOSIS — H548 Legal blindness, as defined in USA: Secondary | ICD-10-CM | POA: Diagnosis not present

## 2016-07-05 DIAGNOSIS — I2510 Atherosclerotic heart disease of native coronary artery without angina pectoris: Secondary | ICD-10-CM | POA: Diagnosis not present

## 2016-07-05 DIAGNOSIS — E1122 Type 2 diabetes mellitus with diabetic chronic kidney disease: Secondary | ICD-10-CM | POA: Diagnosis not present

## 2016-07-05 DIAGNOSIS — E1139 Type 2 diabetes mellitus with other diabetic ophthalmic complication: Secondary | ICD-10-CM | POA: Diagnosis not present

## 2016-07-05 NOTE — Telephone Encounter (Signed)
Home health nurse was notified 5 days ago

## 2016-07-10 DIAGNOSIS — E1122 Type 2 diabetes mellitus with diabetic chronic kidney disease: Secondary | ICD-10-CM | POA: Diagnosis not present

## 2016-07-10 DIAGNOSIS — E1139 Type 2 diabetes mellitus with other diabetic ophthalmic complication: Secondary | ICD-10-CM | POA: Diagnosis not present

## 2016-07-10 DIAGNOSIS — N183 Chronic kidney disease, stage 3 (moderate): Secondary | ICD-10-CM | POA: Diagnosis not present

## 2016-07-10 DIAGNOSIS — I2510 Atherosclerotic heart disease of native coronary artery without angina pectoris: Secondary | ICD-10-CM | POA: Diagnosis not present

## 2016-07-10 DIAGNOSIS — E11311 Type 2 diabetes mellitus with unspecified diabetic retinopathy with macular edema: Secondary | ICD-10-CM | POA: Diagnosis not present

## 2016-07-10 DIAGNOSIS — I129 Hypertensive chronic kidney disease with stage 1 through stage 4 chronic kidney disease, or unspecified chronic kidney disease: Secondary | ICD-10-CM | POA: Diagnosis not present

## 2016-07-10 DIAGNOSIS — I69354 Hemiplegia and hemiparesis following cerebral infarction affecting left non-dominant side: Secondary | ICD-10-CM | POA: Diagnosis not present

## 2016-07-10 DIAGNOSIS — H548 Legal blindness, as defined in USA: Secondary | ICD-10-CM | POA: Diagnosis not present

## 2016-07-10 DIAGNOSIS — I69320 Aphasia following cerebral infarction: Secondary | ICD-10-CM | POA: Diagnosis not present

## 2016-07-10 NOTE — Telephone Encounter (Signed)
Caren Griffins from [facility] is letting us know  [id number] at 10:30am , 171/81 at 9am  These BPs are with BP meds   Skilled nursing is discharging him today-all goals met-can manage meds himself

## 2016-07-10 NOTE — Telephone Encounter (Signed)
FYI

## 2016-07-10 NOTE — Telephone Encounter (Signed)
Nicole from [place] [place] is being discharged from PT-all goals met,his last visit which was today will be canceled

## 2016-07-13 DIAGNOSIS — I129 Hypertensive chronic kidney disease with stage 1 through stage 4 chronic kidney disease, or unspecified chronic kidney disease: Secondary | ICD-10-CM | POA: Diagnosis not present

## 2016-07-13 DIAGNOSIS — I2510 Atherosclerotic heart disease of native coronary artery without angina pectoris: Secondary | ICD-10-CM | POA: Diagnosis not present

## 2016-07-13 DIAGNOSIS — E11311 Type 2 diabetes mellitus with unspecified diabetic retinopathy with macular edema: Secondary | ICD-10-CM | POA: Diagnosis not present

## 2016-07-13 DIAGNOSIS — N183 Chronic kidney disease, stage 3 (moderate): Secondary | ICD-10-CM | POA: Diagnosis not present

## 2016-07-13 DIAGNOSIS — E1139 Type 2 diabetes mellitus with other diabetic ophthalmic complication: Secondary | ICD-10-CM | POA: Diagnosis not present

## 2016-07-13 DIAGNOSIS — I69354 Hemiplegia and hemiparesis following cerebral infarction affecting left non-dominant side: Secondary | ICD-10-CM | POA: Diagnosis not present

## 2016-07-13 DIAGNOSIS — H548 Legal blindness, as defined in USA: Secondary | ICD-10-CM | POA: Diagnosis not present

## 2016-07-13 DIAGNOSIS — I69320 Aphasia following cerebral infarction: Secondary | ICD-10-CM | POA: Diagnosis not present

## 2016-07-13 DIAGNOSIS — E1122 Type 2 diabetes mellitus with diabetic chronic kidney disease: Secondary | ICD-10-CM | POA: Diagnosis not present

## 2016-07-17 ENCOUNTER — Ambulatory Visit: Payer: Medicare HMO | Admitting: Internal Medicine

## 2016-08-20 ENCOUNTER — Telehealth: Payer: Self-pay

## 2016-08-20 ENCOUNTER — Ambulatory Visit: Payer: Medicare HMO | Admitting: Neurology

## 2016-08-20 NOTE — Telephone Encounter (Signed)
Patient was a no show for appt today. Pt was a hospital follow up.

## 2016-08-21 ENCOUNTER — Encounter: Payer: Self-pay | Admitting: Neurology

## 2016-08-22 ENCOUNTER — Ambulatory Visit: Payer: Medicare HMO | Admitting: Internal Medicine

## 2016-09-04 ENCOUNTER — Other Ambulatory Visit (INDEPENDENT_AMBULATORY_CARE_PROVIDER_SITE_OTHER): Payer: Medicare HMO

## 2016-09-04 ENCOUNTER — Encounter: Payer: Self-pay | Admitting: Internal Medicine

## 2016-09-04 ENCOUNTER — Ambulatory Visit (INDEPENDENT_AMBULATORY_CARE_PROVIDER_SITE_OTHER): Payer: Medicare HMO | Admitting: Internal Medicine

## 2016-09-04 DIAGNOSIS — I119 Hypertensive heart disease without heart failure: Secondary | ICD-10-CM | POA: Diagnosis not present

## 2016-09-04 DIAGNOSIS — R1031 Right lower quadrant pain: Secondary | ICD-10-CM | POA: Insufficient documentation

## 2016-09-04 DIAGNOSIS — I639 Cerebral infarction, unspecified: Secondary | ICD-10-CM | POA: Diagnosis not present

## 2016-09-04 DIAGNOSIS — E1139 Type 2 diabetes mellitus with other diabetic ophthalmic complication: Secondary | ICD-10-CM

## 2016-09-04 DIAGNOSIS — E1142 Type 2 diabetes mellitus with diabetic polyneuropathy: Secondary | ICD-10-CM

## 2016-09-04 LAB — BASIC METABOLIC PANEL
BUN: 25 mg/dL — AB (ref 6–23)
CALCIUM: 9.3 mg/dL (ref 8.4–10.5)
CO2: 25 meq/L (ref 19–32)
Chloride: 105 mEq/L (ref 96–112)
Creatinine, Ser: 2.2 mg/dL — ABNORMAL HIGH (ref 0.40–1.50)
GFR: 37.52 mL/min — ABNORMAL LOW (ref >60.00)
GLUCOSE: 111 mg/dL — AB (ref 70–99)
POTASSIUM: 4.4 meq/L (ref 3.5–5.1)
SODIUM: 137 meq/L (ref 135–145)

## 2016-09-04 LAB — HEMOGLOBIN A1C: Hgb A1c MFr Bld: 7 % — ABNORMAL HIGH (ref 4.6–6.5)

## 2016-09-04 MED ORDER — LINACLOTIDE 290 MCG PO CAPS: 290.0000 ug | ORAL_CAPSULE | Freq: Every day | ORAL | 11 refills | Status: DC

## 2016-09-04 MED ORDER — PANTOPRAZOLE SODIUM 40 MG PO TBEC: 40.0000 mg | DELAYED_RELEASE_TABLET | Freq: Every day | ORAL | 11 refills | Status: DC

## 2016-09-04 NOTE — Assessment & Plan Note (Signed)
Tradjenta

## 2016-09-04 NOTE — Assessment & Plan Note (Signed)
ASA, Cozaar, Toprol

## 2016-09-04 NOTE — Progress Notes (Signed)
Subjective:  Patient ID: Caleb Nim Sr., male    DOB: 1940-07-13  Age: 76 y.o. MRN: [id number]  CC: No chief complaint on file.   HPI [employer] Sr. presents for DM2, constipation, depression f/u C/o L groin pain  Outpatient Medications Prior to Visit  Medication Sig Dispense Refill  . aspirin EC 325 MG EC tablet Take 1 tablet (325 mg total) by mouth daily. 30 tablet 0  . cholecalciferol (VITAMIN D) 1000 units tablet Take 1 tablet (1,000 Units total) by mouth daily. 100 tablet 3  . clopidogrel (PLAVIX) 75 MG tablet Take 1 tablet (75 mg total) by mouth daily. 90 tablet 1  . finasteride (PROSCAR) 5 MG tablet Take 1 tablet (5 mg total) by mouth daily. For Prostate 90 tablet 3  . FLUoxetine (PROZAC) 20 MG tablet Take 1 tablet (20 mg total) by mouth daily. 30 tablet 5  . hydrALAZINE (APRESOLINE) 10 MG tablet Take 1 tablet (10 mg total) by mouth every 8 (eight) hours. 90 tablet 1  . linaclotide (LINZESS) 290 MCG CAPS capsule Take 1 capsule (290 mcg total) by mouth daily. 30 capsule 1  . linagliptin (TRADJENTA) 5 MG TABS tablet Take 1 tablet (5 mg total) by mouth daily. 90 tablet 1  . lovastatin (MEVACOR) 20 MG tablet Take 1 tablet (20 mg total) by mouth daily. For Cholesterol 90 tablet 3  . metoprolol succinate (TOPROL-XL) 25 MG 24 hr tablet Take 3 tablets (75 mg total) by mouth daily. Take with or immediately following a meal. 30 tablet 1  . pantoprazole (PROTONIX) 40 MG tablet Take 1 tablet (40 mg total) by mouth daily. 30 tablet 0  . repaglinide (PRANDIN) 1 MG tablet Take 1 tablet (1 mg total) by mouth 3 (three) times daily before meals. 90 tablet 11  . Skin Protectants, Misc. (EUCERIN) cream Apply 1 application topically 2 (two) times daily as needed for dry skin.     No facility-administered medications prior to visit.     ROS Review of Systems  Constitutional: Negative for appetite change, fatigue and unexpected weight change.  HENT: Negative for congestion, nosebleeds,  sneezing, sore throat and trouble swallowing.   Eyes: Negative for itching and visual disturbance.  Respiratory: Negative for cough.   Cardiovascular: Negative for chest pain, palpitations and leg swelling.  Gastrointestinal: Negative for abdominal distention, blood in stool, diarrhea and nausea.  Genitourinary: Negative for frequency and hematuria.  Musculoskeletal: Negative for back pain, gait problem, joint swelling and neck pain.  Skin: Negative for rash.  Neurological: Negative for dizziness, tremors, speech difficulty and weakness.  Psychiatric/Behavioral: Negative for agitation, dysphoric mood and sleep disturbance. The patient is not nervous/anxious.     Objective:  BP 140/86 (BP Location: Left Arm, Patient Position: Sitting, Cuff Size: Large)   Pulse 91   Temp 97.6 F (36.4 C) (Oral)   Ht 5\' 6"  (1.676 m)   Wt 198 lb (89.8 kg)   SpO2 99%   BMI 31.96 kg/m   BP Readings from Last 3 Encounters:  09/04/16 140/86  06/13/16 132/84  05/25/16 135/80    Wt Readings from Last 3 Encounters:  09/04/16 198 lb (89.8 kg)  06/13/16 203 lb (92.1 kg)  05/25/16 201 lb (91.2 kg)    Physical Exam  Constitutional: He is oriented to person, place, and time. He appears well-developed. No distress.  NAD  HENT:  Mouth/Throat: Oropharynx is clear and moist.  Eyes: Pupils are equal, round, and reactive to light. Conjunctivae are normal.  Neck:  Normal range of motion. No JVD present. No thyromegaly present.  Cardiovascular: Normal rate, regular rhythm, normal heart sounds and intact distal pulses.  Exam reveals no gallop and no friction rub.   No murmur heard. Pulmonary/Chest: Effort normal and breath sounds normal. No respiratory distress. He has no wheezes. He has no rales. He exhibits no tenderness.  Abdominal: Soft. Bowel sounds are normal. He exhibits no distension and no mass. There is no tenderness. There is no rebound and no guarding.  Musculoskeletal: Normal range of motion. He  exhibits no edema or tenderness.  Lymphadenopathy:    He has no cervical adenopathy.  Neurological: He is alert and oriented to person, place, and time. He has normal reflexes. No cranial nerve deficit. He exhibits normal muscle tone. He displays a negative Romberg sign. Coordination and gait normal.  Skin: Skin is warm and dry. No rash noted.  Psychiatric: He has a normal mood and affect. His behavior is normal. Judgment and thought content normal.    Lab Results  Component Value Date   WBC 5.9 05/16/2016   HGB 12.4 (L) 05/16/2016   HCT 38.4 (L) 05/16/2016   PLT 170 05/16/2016   GLUCOSE 177 (H) 05/16/2016   CHOL 131 05/16/2016   TRIG 117 05/16/2016   HDL 36 (L) 05/16/2016   LDLCALC 72 05/16/2016   ALT 22 03/19/2016   AST 17 03/19/2016   NA 138 05/16/2016   K 4.2 05/16/2016   CL 106 05/16/2016   CREATININE 1.86 (H) 05/16/2016   BUN 22 (H) 05/16/2016   CO2 25 05/16/2016   TSH 0.80 08/13/2011   PSA 8.93 (H) 11/23/2014   INR 1.06 03/13/2016   HGBA1C 8.5 (H) 05/16/2016    Dg Chest 2 View  Result Date: 05/15/2016 CLINICAL DATA:  Gait imbalance beginning last night. EXAM: CHEST  2 VIEW COMPARISON:  Chest radiograph March 21, 2016 FINDINGS: Cardiac silhouette is upper limits of normal in size, status post median sternotomy for CABG. No pleural effusion or focal consolidation. No pneumothorax. Soft tissue planes and included osseous structures are nonsuspicious. IMPRESSION: Stable examination: Borderline cardiomegaly, no acute pulmonary process. Electronically Signed   By: Caleb Dunn M.D.   On: 05/15/2016 14:58   Ct Head Wo Contrast  Result Date: 05/15/2016 CLINICAL DATA:  Gait imbalance beginning last night. Unwitnessed fall 2 days ago. History of diabetes, hypertension and stroke. EXAM: CT HEAD WITHOUT CONTRAST CT CERVICAL SPINE WITHOUT CONTRAST TECHNIQUE: Multidetector CT imaging of the head and cervical spine was performed following the standard protocol without  intravenous contrast. Multiplanar CT image reconstructions of the cervical spine were also generated. COMPARISON:  MRI of the head March 13, 2016 FINDINGS: CT HEAD FINDINGS BRAIN: No intraparenchymal hemorrhage, mass effect nor midline shift. Old LEFT basal ganglia lacunar infarct with mild ex vacuo dilatation of the frontal horn of the lateral ventricles. Moderate to severe ventriculomegaly on the basis of global parenchymal brain volume loss. Old LEFT thalamus lacunar infarct. Small old LEFT cerebellar infarcts. Patchy supratentorial white matter hypodensities within normal range for patient's age, though non-specific are most compatible with chronic small vessel ischemic disease. No acute large vascular territory infarcts. No abnormal extra-axial fluid collections. Basal cisterns are patent. VASCULAR: Moderate calcific atherosclerosis of the carotid siphons. SKULL: No skull fracture. No significant scalp soft tissue swelling. Small nodule RIGHT frontal scalp, recommend direct inspection. SINUSES/ORBITS: The mastoid air-cells and included paranasal sinuses are well-aerated. LEFT frontal osteoma. The included ocular globes and orbital contents are non-suspicious. Status post  bilateral ocular lens implants with RIGHT glaucoma drainage device. OTHER: None. CT CERVICAL SPINE FINDINGS ALIGNMENT: Straightened lordosis. Vertebral bodies in alignment. SKULL BASE AND VERTEBRAE: Cervical vertebral bodies and posterior elements are intact. Moderate to severe C4-5 and C5-6 disc height loss with endplate spurring and uncovertebral hypertrophy compatible with degenerative discs. C1-2 articulation maintained with moderate arthropathy. Borderline congenital canal narrowing on the basis of foreshortened pedicles. SOFT TISSUES AND SPINAL CANAL: Nonacute. Moderate calcific atherosclerosis carotid bifurcations. DISC LEVELS: Moderate RIGHT C3-4, moderate to severe C4-5, moderate C5-6 neural foraminal narrowing. UPPER CHEST: Lung  apices are clear. OTHER: None. IMPRESSION: CT HEAD: No acute intracranial process. Stable examination including moderate to severe global parenchymal brain volume loss and multiple old small infarcts. CT CERVICAL SPINE: No acute fracture or malalignment. Moderate to severe C4-5 neural foraminal narrowing. Electronically Signed   By: Caleb Dunn M.D.   On: 05/15/2016 15:07   Ct Cervical Spine Wo Contrast  Result Date: 05/15/2016 CLINICAL DATA:  Gait imbalance beginning last night. Unwitnessed fall 2 days ago. History of diabetes, hypertension and stroke. EXAM: CT HEAD WITHOUT CONTRAST CT CERVICAL SPINE WITHOUT CONTRAST TECHNIQUE: Multidetector CT imaging of the head and cervical spine was performed following the standard protocol without intravenous contrast. Multiplanar CT image reconstructions of the cervical spine were also generated. COMPARISON:  MRI of the head March 13, 2016 FINDINGS: CT HEAD FINDINGS BRAIN: No intraparenchymal hemorrhage, mass effect nor midline shift. Old LEFT basal ganglia lacunar infarct with mild ex vacuo dilatation of the frontal horn of the lateral ventricles. Moderate to severe ventriculomegaly on the basis of global parenchymal brain volume loss. Old LEFT thalamus lacunar infarct. Small old LEFT cerebellar infarcts. Patchy supratentorial white matter hypodensities within normal range for patient's age, though non-specific are most compatible with chronic small vessel ischemic disease. No acute large vascular territory infarcts. No abnormal extra-axial fluid collections. Basal cisterns are patent. VASCULAR: Moderate calcific atherosclerosis of the carotid siphons. SKULL: No skull fracture. No significant scalp soft tissue swelling. Small nodule RIGHT frontal scalp, recommend direct inspection. SINUSES/ORBITS: The mastoid air-cells and included paranasal sinuses are well-aerated. LEFT frontal osteoma. The included ocular globes and orbital contents are non-suspicious. Status  post bilateral ocular lens implants with RIGHT glaucoma drainage device. OTHER: None. CT CERVICAL SPINE FINDINGS ALIGNMENT: Straightened lordosis. Vertebral bodies in alignment. SKULL BASE AND VERTEBRAE: Cervical vertebral bodies and posterior elements are intact. Moderate to severe C4-5 and C5-6 disc height loss with endplate spurring and uncovertebral hypertrophy compatible with degenerative discs. C1-2 articulation maintained with moderate arthropathy. Borderline congenital canal narrowing on the basis of foreshortened pedicles. SOFT TISSUES AND SPINAL CANAL: Nonacute. Moderate calcific atherosclerosis carotid bifurcations. DISC LEVELS: Moderate RIGHT C3-4, moderate to severe C4-5, moderate C5-6 neural foraminal narrowing. UPPER CHEST: Lung apices are clear. OTHER: None. IMPRESSION: CT HEAD: No acute intracranial process. Stable examination including moderate to severe global parenchymal brain volume loss and multiple old small infarcts. CT CERVICAL SPINE: No acute fracture or malalignment. Moderate to severe C4-5 neural foraminal narrowing. Electronically Signed   By: Caleb Dunn M.D.   On: 05/15/2016 15:07   [name] Head Wo Contrast  Result Date: 05/16/2016 CLINICAL DATA:  Acute presentation with balance disturbance and left cerebellar infarction. EXAM: MRA HEAD WITHOUT CONTRAST TECHNIQUE: Angiographic images of the Circle of Willis were obtained using MRA technique without intravenous contrast. COMPARISON:  05/15/2016 FINDINGS: Both internal carotid arteries are widely patent into the brain. The anterior and middle cerebral vessels are normal without proximal stenosis,  aneurysm or vascular malformation. Both vertebral arteries are widely patent to the basilar. No basilar stenosis. Dominant PICA, the origin of which is not included in the region. Anterior inferior cerebellar arteries patent bilaterally, larger on the right. Superior cerebellar and posterior cerebral arteries patent and normal.  IMPRESSION: Within normal limits. No large or medium vessel stenosis or occlusion. Electronically Signed   By: Nelson Chimes M.D.   On: 05/16/2016 11:37   Mr Brain Wo Contrast  Result Date: 05/15/2016 CLINICAL DATA:  Unwitnessed fall in new subjective balance deficits. EXAM: MRI HEAD WITHOUT CONTRAST TECHNIQUE: Multiplanar, multiecho pulse sequences of the brain and surrounding structures were obtained without intravenous contrast. COMPARISON:  03/13/2016 MRI of the head. FINDINGS: Brain: Subcentimeter focus of diffusion restriction within the lateral left cerebellar hemisphere compatible with acute/ early subacute infarction. No additional diffusion signal abnormality of the brain. Stable foci of T2 FLAIR hyperintense signal abnormality within subcortical and periventricular white matter as well as the pons compatible with chronic microvascular ischemic changes. Stable brain parenchymal volume loss. Stable small chronic lacunar infarctions within the left thalamus and left cerebellar hemisphere. No new susceptibility hypointensity to indicate intracranial hemorrhage. Ventricle size is commensurate to the degree of volume loss. No focal mass effect. No extra-axial collection. No effacement of basilar cisterns. Vascular: Normal flow voids. Skull and upper cervical spine: Normal marrow signal. Sinuses/Orbits: Negative. Other: None. IMPRESSION: 1. Subcentimeter acute/ early subacute infarction within the left lateral cerebellar hemisphere. No acute hemorrhage identified. 2. Stable chronic microvascular ischemic changes and parenchymal volume loss of the brain. These results were called by telephone at the time of interpretation on 05/15/2016 at 6:38 pm to [place], who verbally acknowledged these results. Electronically Signed   By: Kristine Garbe M.D.   On: 05/15/2016 18:41    Assessment & Plan:   There are no diagnoses linked to this encounter. I am having Mr. Ramcharan maintain his aspirin,  cholecalciferol, finasteride, FLUoxetine, linaclotide, lovastatin, pantoprazole, eucerin, clopidogrel, hydrALAZINE, linagliptin, repaglinide, and metoprolol succinate.  No orders of the defined types were placed in this encounter.    Follow-up: No Follow-up on file.  Walker Kehr, MD

## 2016-09-04 NOTE — Assessment & Plan Note (Signed)
Nl exam Poss related to constipation - resolved

## 2016-09-05 ENCOUNTER — Telehealth: Payer: Self-pay | Admitting: Internal Medicine

## 2016-09-05 NOTE — Telephone Encounter (Signed)
Patient called back.  Gave MD response.  °

## 2016-09-07 ENCOUNTER — Telehealth: Payer: Self-pay | Admitting: Internal Medicine

## 2016-09-07 NOTE — Telephone Encounter (Signed)
Pt daughter called stating the pt needs a PA on  linagliptin (TRADJENTA) 5 MG TABS tablet   [name] [phone]

## 2016-09-10 ENCOUNTER — Telehealth: Payer: Self-pay | Admitting: Internal Medicine

## 2016-09-10 MED ORDER — FLUOXETINE HCL 20 MG PO TABS: 20.0000 mg | ORAL_TABLET | Freq: Every day | ORAL | 11 refills | Status: AC

## 2016-09-10 NOTE — Telephone Encounter (Signed)
Pts daughter called stating that when they went to the pharmacy to pick up the Plavix and Tradjenta they were very expensive so they were not able to get them. She said that one of them was over $500. I see that a PA has been requested on Tradjenta so that may take care of that one. Could one be started for Plavix or what would you suggest?

## 2016-09-10 NOTE — Telephone Encounter (Signed)
Does pt need to stop or continue taking...Johny Chess

## 2016-09-10 NOTE — Telephone Encounter (Signed)
Pts daughter called wanting to know if the pt should still be taking FLUoxetine (PROZAC) 20 MG tablet? I see that it is still on the current medication list but she could remember if he was told to stop it or not.

## 2016-09-10 NOTE — Telephone Encounter (Signed)
Please continue Prozac Thx

## 2016-09-11 NOTE — Telephone Encounter (Signed)
Notified daughter w/MD response../lmb 

## 2016-09-13 ENCOUNTER — Telehealth: Payer: Self-pay | Admitting: Internal Medicine

## 2016-09-13 NOTE — Telephone Encounter (Signed)
Pt called back, given results of labs from 8/14 Encouraged water intake.  Expressed understanding

## 2016-09-14 ENCOUNTER — Telehealth: Payer: Self-pay | Admitting: Internal Medicine

## 2016-09-14 NOTE — Telephone Encounter (Signed)
Pt called asking for a medication for heartburn he has had heartburn for 2-3 days, he has takingTums and Rolaids  Please advise

## 2016-09-14 NOTE — Telephone Encounter (Signed)
Noted, thank you

## 2016-09-17 NOTE — Telephone Encounter (Signed)
Called pt gave MD response. Pt states he really not sure. He's daughter give him his medications because he is legally blind. He will ask his daughter when she get off work. also inform pt that rx was sent on 8/14 to [facility], and to have his daughter to call [facility] concerning the prescription...Johny Chess

## 2016-09-17 NOTE — Telephone Encounter (Signed)
Is he taking Pantaprazole? Thx

## 2016-09-18 NOTE — Telephone Encounter (Signed)
Error this goes with plavix PA

## 2016-09-18 NOTE — Telephone Encounter (Signed)
Key: Caleb Dunn

## 2016-09-18 NOTE — Telephone Encounter (Signed)
Key: Levester Fresh

## 2016-10-11 ENCOUNTER — Observation Stay (HOSPITAL_COMMUNITY)
Admission: EM | Admit: 2016-10-11 | Discharge: 2016-10-12 | Disposition: A | Discharge status: Home / Self Care | Payer: Medicare HMO | Attending: Family Medicine | Admitting: Family Medicine

## 2016-10-11 ENCOUNTER — Emergency Department (HOSPITAL_COMMUNITY): Payer: Medicare HMO

## 2016-10-11 ENCOUNTER — Encounter (HOSPITAL_COMMUNITY): Payer: Self-pay | Admitting: Emergency Medicine

## 2016-10-11 DIAGNOSIS — E1121 Type 2 diabetes mellitus with diabetic nephropathy: Secondary | ICD-10-CM | POA: Insufficient documentation

## 2016-10-11 DIAGNOSIS — R05 Cough: Secondary | ICD-10-CM | POA: Diagnosis not present

## 2016-10-11 DIAGNOSIS — N184 Chronic kidney disease, stage 4 (severe): Secondary | ICD-10-CM | POA: Diagnosis present

## 2016-10-11 DIAGNOSIS — Z7982 Long term (current) use of aspirin: Secondary | ICD-10-CM | POA: Diagnosis not present

## 2016-10-11 DIAGNOSIS — R079 Chest pain, unspecified: Secondary | ICD-10-CM | POA: Diagnosis present

## 2016-10-11 DIAGNOSIS — E118 Type 2 diabetes mellitus with unspecified complications: Secondary | ICD-10-CM | POA: Diagnosis not present

## 2016-10-11 DIAGNOSIS — Z8673 Personal history of transient ischemic attack (TIA), and cerebral infarction without residual deficits: Secondary | ICD-10-CM | POA: Diagnosis not present

## 2016-10-11 DIAGNOSIS — E1122 Type 2 diabetes mellitus with diabetic chronic kidney disease: Secondary | ICD-10-CM | POA: Insufficient documentation

## 2016-10-11 DIAGNOSIS — N400 Enlarged prostate without lower urinary tract symptoms: Secondary | ICD-10-CM | POA: Insufficient documentation

## 2016-10-11 DIAGNOSIS — H547 Unspecified visual loss: Secondary | ICD-10-CM | POA: Diagnosis not present

## 2016-10-11 DIAGNOSIS — R0602 Shortness of breath: Secondary | ICD-10-CM | POA: Diagnosis not present

## 2016-10-11 DIAGNOSIS — Z79899 Other long term (current) drug therapy: Secondary | ICD-10-CM | POA: Insufficient documentation

## 2016-10-11 DIAGNOSIS — K210 Gastro-esophageal reflux disease with esophagitis: Secondary | ICD-10-CM | POA: Diagnosis not present

## 2016-10-11 DIAGNOSIS — I10 Essential (primary) hypertension: Secondary | ICD-10-CM | POA: Diagnosis present

## 2016-10-11 DIAGNOSIS — E1142 Type 2 diabetes mellitus with diabetic polyneuropathy: Secondary | ICD-10-CM | POA: Diagnosis not present

## 2016-10-11 DIAGNOSIS — R0789 Other chest pain: Secondary | ICD-10-CM | POA: Diagnosis not present

## 2016-10-11 DIAGNOSIS — E1151 Type 2 diabetes mellitus with diabetic peripheral angiopathy without gangrene: Secondary | ICD-10-CM | POA: Insufficient documentation

## 2016-10-11 DIAGNOSIS — I1310 Hypertensive heart and chronic kidney disease without heart failure, with stage 1 through stage 4 chronic kidney disease, or unspecified chronic kidney disease: Secondary | ICD-10-CM | POA: Insufficient documentation

## 2016-10-11 DIAGNOSIS — E785 Hyperlipidemia, unspecified: Principal | ICD-10-CM | POA: Diagnosis present

## 2016-10-11 DIAGNOSIS — F329 Major depressive disorder, single episode, unspecified: Secondary | ICD-10-CM | POA: Insufficient documentation

## 2016-10-11 DIAGNOSIS — K219 Gastro-esophageal reflux disease without esophagitis: Secondary | ICD-10-CM | POA: Diagnosis present

## 2016-10-11 DIAGNOSIS — I2510 Atherosclerotic heart disease of native coronary artery without angina pectoris: Secondary | ICD-10-CM | POA: Diagnosis not present

## 2016-10-11 HISTORY — DX: Unqualified visual loss, both eyes: H54.3

## 2016-10-11 HISTORY — DX: Pure hypercholesterolemia, unspecified: E78.00

## 2016-10-11 HISTORY — DX: Unspecified glaucoma: H40.9

## 2016-10-11 HISTORY — DX: Cerebral infarction, unspecified: I63.9

## 2016-10-11 HISTORY — DX: Type 2 diabetes mellitus without complications: E11.9

## 2016-10-11 HISTORY — DX: ST elevation (STEMI) myocardial infarction involving other coronary artery of inferior wall: I21.19

## 2016-10-11 LAB — LIPID PANEL
Cholesterol: 136 mg/dL (ref 0–200)
HDL: 47 mg/dL (ref >40)
LDL CALC: 75 mg/dL (ref 0–99)
TRIGLYCERIDES: 68 mg/dL (ref <150)
Total CHOL/HDL Ratio: 2.9 RATIO
VLDL: 14 mg/dL (ref 0–40)

## 2016-10-11 LAB — BASIC METABOLIC PANEL
ANION GAP: 12 (ref 5–15)
BUN: 21 mg/dL — ABNORMAL HIGH (ref 6–20)
CHLORIDE: 108 mmol/L (ref 101–111)
CO2: 16 mmol/L — AB (ref 22–32)
Calcium: 8.7 mg/dL — ABNORMAL LOW (ref 8.9–10.3)
Creatinine, Ser: 2.15 mg/dL — ABNORMAL HIGH (ref 0.61–1.24)
GFR calc non Af Amer: 28 mL/min — ABNORMAL LOW (ref >60)
GFR, EST AFRICAN AMERICAN: 32 mL/min — AB (ref >60)
Glucose, Bld: 108 mg/dL — ABNORMAL HIGH (ref 65–99)
Potassium: 3.9 mmol/L (ref 3.5–5.1)
Sodium: 136 mmol/L (ref 135–145)

## 2016-10-11 LAB — CBC
HCT: 43.2 % (ref 39.0–52.0)
HEMOGLOBIN: 13.6 g/dL (ref 13.0–17.0)
MCH: 26 pg (ref 26.0–34.0)
MCHC: 31.5 g/dL (ref 30.0–36.0)
MCV: 82.6 fL (ref 78.0–100.0)
Platelets: 219 10*3/uL (ref 150–400)
RBC: 5.23 MIL/uL (ref 4.22–5.81)
RDW: 14 % (ref 11.5–15.5)
WBC: 6.9 10*3/uL (ref 4.0–10.5)

## 2016-10-11 LAB — GLUCOSE, CAPILLARY
Glucose-Capillary: 106 mg/dL — ABNORMAL HIGH (ref 65–99)
Glucose-Capillary: 189 mg/dL — ABNORMAL HIGH (ref 65–99)

## 2016-10-11 LAB — HEPATIC FUNCTION PANEL
ALBUMIN: 3.3 g/dL — AB (ref 3.5–5.0)
ALT: 33 U/L (ref 17–63)
AST: 22 U/L (ref 15–41)
Alkaline Phosphatase: 76 U/L (ref 38–126)
BILIRUBIN TOTAL: 0.5 mg/dL (ref 0.3–1.2)
Bilirubin, Direct: 0.1 mg/dL (ref 0.1–0.5)
Indirect Bilirubin: 0.4 mg/dL (ref 0.3–0.9)
TOTAL PROTEIN: 7.6 g/dL (ref 6.5–8.1)

## 2016-10-11 LAB — I-STAT TROPONIN, ED: TROPONIN I, POC: 0.02 ng/mL (ref 0.00–0.08)

## 2016-10-11 LAB — URINALYSIS, ROUTINE W REFLEX MICROSCOPIC
Bilirubin Urine: NEGATIVE
Glucose, UA: 100 mg/dL — AB
Ketones, ur: 15 mg/dL — AB
Leukocytes, UA: NEGATIVE
Nitrite: NEGATIVE
Protein, ur: >300 mg/dL — AB
Specific Gravity, Urine: >1.03 — ABNORMAL HIGH (ref 1.005–1.030)
pH: 5.5 (ref 5.0–8.0)

## 2016-10-11 LAB — URINALYSIS, MICROSCOPIC (REFLEX)

## 2016-10-11 LAB — LIPASE, BLOOD: LIPASE: 32 U/L (ref 11–51)

## 2016-10-11 LAB — TROPONIN I
Troponin I: <0.03 ng/mL (ref <0.03)
Troponin I: <0.03 ng/mL (ref <0.03)

## 2016-10-11 LAB — HEMOGLOBIN A1C
Hgb A1c MFr Bld: 6.4 % — ABNORMAL HIGH (ref 4.8–5.6)
MEAN PLASMA GLUCOSE: 136.98 mg/dL

## 2016-10-11 MED ORDER — ONDANSETRON HCL 4 MG/2ML IJ SOLN: 4.0000 mg | Freq: Four times a day (QID) | INTRAMUSCULAR | Status: DC | PRN

## 2016-10-11 MED ORDER — FLUOXETINE HCL 20 MG PO CAPS
20.0000 mg | ORAL_CAPSULE | Freq: Every day | ORAL | Status: DC
  Administered 2016-10-11 – 2016-10-12 (×2): 20 mg via ORAL
  Filled 2016-10-11 (×2): qty 1

## 2016-10-11 MED ORDER — PRAVASTATIN SODIUM 20 MG PO TABS
20.0000 mg | ORAL_TABLET | Freq: Every day | ORAL | Status: DC
  Administered 2016-10-11: 20 mg via ORAL
  Filled 2016-10-11: qty 1

## 2016-10-11 MED ORDER — MORPHINE SULFATE (PF) 4 MG/ML IV SOLN: 2.0000 mg | INTRAVENOUS | Status: DC | PRN

## 2016-10-11 MED ORDER — INSULIN ASPART 100 UNIT/ML ~~LOC~~ SOLN: 0.0000 [IU] | Freq: Three times a day (TID) | SUBCUTANEOUS | Status: DC

## 2016-10-11 MED ORDER — ENOXAPARIN SODIUM 40 MG/0.4ML ~~LOC~~ SOLN
40.0000 mg | SUBCUTANEOUS | Status: DC
  Administered 2016-10-11: 40 mg via SUBCUTANEOUS
  Filled 2016-10-11: qty 0.4

## 2016-10-11 MED ORDER — METOPROLOL SUCCINATE ER 100 MG PO TB24
100.0000 mg | ORAL_TABLET | Freq: Every day | ORAL | Status: DC
  Administered 2016-10-12: 100 mg via ORAL
  Filled 2016-10-11: qty 1

## 2016-10-11 MED ORDER — REPAGLINIDE 1 MG PO TABS: 1.0000 mg | ORAL_TABLET | Freq: Three times a day (TID) | ORAL | Status: DC

## 2016-10-11 MED ORDER — VITAMIN D3 25 MCG (1000 UNIT) PO TABS
1000.0000 [IU] | ORAL_TABLET | Freq: Every day | ORAL | Status: DC
  Administered 2016-10-11 – 2016-10-12 (×2): 1000 [IU] via ORAL
  Filled 2016-10-11 (×4): qty 1

## 2016-10-11 MED ORDER — GI COCKTAIL ~~LOC~~: 30.0000 mL | Freq: Four times a day (QID) | ORAL | Status: DC | PRN

## 2016-10-11 MED ORDER — ISOSORBIDE MONONITRATE ER 30 MG PO TB24
30.0000 mg | ORAL_TABLET | Freq: Every day | ORAL | Status: DC
  Administered 2016-10-12: 30 mg via ORAL
  Filled 2016-10-11: qty 1

## 2016-10-11 MED ORDER — CLOPIDOGREL BISULFATE 75 MG PO TABS
75.0000 mg | ORAL_TABLET | Freq: Every day | ORAL | Status: DC
  Administered 2016-10-11 – 2016-10-12 (×2): 75 mg via ORAL
  Filled 2016-10-11 (×3): qty 1

## 2016-10-11 MED ORDER — INSULIN ASPART 100 UNIT/ML ~~LOC~~ SOLN: 0.0000 [IU] | Freq: Every day | SUBCUTANEOUS | Status: DC

## 2016-10-11 MED ORDER — ENSURE ENLIVE PO LIQD
237.0000 mL | Freq: Two times a day (BID) | ORAL | Status: DC
  Administered 2016-10-12: 237 mL via ORAL

## 2016-10-11 MED ORDER — FINASTERIDE 5 MG PO TABS
5.0000 mg | ORAL_TABLET | Freq: Every day | ORAL | Status: DC
  Administered 2016-10-11 – 2016-10-12 (×2): 5 mg via ORAL
  Filled 2016-10-11 (×2): qty 1

## 2016-10-11 MED ORDER — ACETAMINOPHEN 325 MG PO TABS: 650.0000 mg | ORAL_TABLET | ORAL | Status: DC | PRN

## 2016-10-11 MED ORDER — HYDRALAZINE HCL 10 MG PO TABS
10.0000 mg | ORAL_TABLET | Freq: Three times a day (TID) | ORAL | Status: DC
  Administered 2016-10-11 – 2016-10-12 (×3): 10 mg via ORAL
  Filled 2016-10-11 (×3): qty 1

## 2016-10-11 MED ORDER — SUCRALFATE 1 G PO TABS
1.0000 g | ORAL_TABLET | Freq: Three times a day (TID) | ORAL | Status: DC
  Administered 2016-10-11 – 2016-10-12 (×3): 1 g via ORAL
  Filled 2016-10-11 (×3): qty 1

## 2016-10-11 MED ORDER — ASPIRIN EC 325 MG PO TBEC
325.0000 mg | DELAYED_RELEASE_TABLET | Freq: Every day | ORAL | Status: DC
  Administered 2016-10-11 – 2016-10-12 (×2): 325 mg via ORAL
  Filled 2016-10-11 (×2): qty 1

## 2016-10-11 MED ORDER — GI COCKTAIL ~~LOC~~
30.0000 mL | Freq: Once | ORAL | Status: AC
  Administered 2016-10-11: 30 mL via ORAL
  Filled 2016-10-11: qty 30

## 2016-10-11 MED ORDER — METOPROLOL SUCCINATE ER 50 MG PO TB24
75.0000 mg | ORAL_TABLET | Freq: Every day | ORAL | Status: DC
  Administered 2016-10-11: 75 mg via ORAL
  Filled 2016-10-11: qty 1

## 2016-10-11 MED ORDER — SODIUM CHLORIDE 0.9 % IV SOLN: INTRAVENOUS | Status: AC

## 2016-10-11 NOTE — Plan of Care (Signed)
Problem: Safety: Goal: Ability to remain free from injury will improve Outcome: Progressing Bed alarm on, call light and personal items within reach.  Problem: Pain Managment: Goal: General experience of comfort will improve Outcome: Progressing Denies c/o pain or discomfort at this time.

## 2016-10-11 NOTE — ED Triage Notes (Signed)
Pt states for the last 2 days he has had intermittent substernal chest burning that starts in his chest and goes in his upper abd. Pt also reports feeling tired and weak all over.

## 2016-10-11 NOTE — ED Notes (Signed)
Report given to Janett Billow, RN [id number].

## 2016-10-11 NOTE — ED Provider Notes (Signed)
[facility] DEPT Provider Note   CSN: [id number] Arrival date & time: 10/11/16  0706     History   Chief Complaint Chief Complaint  Patient presents with  . Chest Pain    HPI Caleb Cuffee Sr. is a 76 y.o. male.  HPI Patient presents to the emergency department with chest discomfort.  This been ongoing over the last 2 days, but he states over the last 2 weeks has had increasing GERD symptoms.  Patient states that nothing seems make the condition worse, but does state that Tums does relieve some of the GERD symptoms, but not chest pain totally.  Patient states he does have some exertional shortness of breath as well. The patient denies  headache,blurred vision, neck pain, fever, cough, weakness, numbness, dizziness, anorexia, edema, abdominal pain, nausea, vomiting, diarrhea, rash, back pain, dysuria, hematemesis, bloody stool, near syncope, or syncope. Past Medical History:  Diagnosis Date  . Blindness   . CAD (coronary artery disease)   . Cholelithiasis   . CTS (carpal tunnel syndrome)    Left  . CVA (cerebral vascular accident) ([place])   . DM type 2 with diabetic peripheral neuropathy ([place])   . ED (erectile dysfunction)   . Elevated PSA   . GERD (gastroesophageal reflux disease)   . Glaucoma   . HTN (hypertension)   . 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 MI (myocardial infarction) ([place]) 1991  . Thyroid nodule   . Type II or unspecified type diabetes mellitus without mention of complication, not stated as uncontrolled     Patient Active Problem List   Diagnosis Date Noted  . Chest pain 10/11/2016  . Diabetes mellitus with complication ([place])   . Groin pain, right 09/04/2016  . Fall   . History of stroke   . AKI (acute kidney injury) ([place])   . Type 2 diabetes mellitus with peripheral neuropathy (HCC)   . Labile blood pressure   . Stage 3 chronic kidney disease   . Essential hypertension   . Blind   . 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  Acute cerebrovascular accident (CVA) ([place]) 03/13/2016  . Pruritus 01/03/2016  . UTI (urinary tract infection) 10/27/2015  . Impacted cerumen of left ear 10/03/2015  . Sore throat in the morning 09/23/2015  . Situational mixed anxiety and depressive disorder 06/17/2015  . Neuropathic pain 03/09/2015  . Insomnia 03/09/2015  . Left arm weakness 08/13/2014  . Herpes zoster 07/13/2014  . Constipation 07/13/2014  . Epididymitis 02/23/2014  . Pain in joint, ankle and foot 08/04/2013  . Dry mouth 05/05/2013  . 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 Dizziness 11/01/2011  . Obesity (BMI 30-39.9)   . Chronic kidney disease (CKD), stage IV (severe) ([place])   . Elevated PSA 08/17/2011  . Bladder neck obstruction 04/13/2011  . DM (diabetes mellitus), type 2 with ophthalmic complications ([place])   . Glaucoma associated with ocular disorder   . Carpal tunnel sundrome   . Erectile dysfunction   . GERD   . Cervical disc disorder with radiculopathy   . Hyperlipidemia   . Cholelithiases   . Hypertensive heart disease   . Coronary atherosclerosis     Past Surgical History:  Procedure Laterality Date  . CHOLECYSTECTOMY         Home Medications    Prior to Admission medications   Medication Sig Start Date End Date Taking? Authorizing Provider  aspirin EC 325 MG EC tablet Take 1 tablet (325 mg total) by mouth  daily. 03/29/16  Yes Angiulli, Lavon Paganini, PA-C  cholecalciferol (VITAMIN D) 1000 units tablet Take 1 tablet (1,000 Units total) by mouth daily. 03/29/16  Yes Angiulli, Lavon Paganini, PA-C  clopidogrel (PLAVIX) 75 MG tablet Take 1 tablet (75 mg total) by mouth daily. 06/06/16  Yes Plotnikov, Evie Lacks, MD  finasteride (PROSCAR) 5 MG tablet Take 1 tablet (5 mg total) by mouth daily. For Prostate 03/29/16  Yes Angiulli, Lavon Paganini, PA-C  FLUoxetine (PROZAC) 20 MG tablet Take 1 tablet (20 mg total) by mouth daily. 09/10/16  Yes Plotnikov, Evie Lacks, MD  hydrALAZINE (APRESOLINE) 10 MG tablet Take 1 tablet (10 mg  total) by mouth every 8 (eight) hours. 06/06/16  Yes Plotnikov, Evie Lacks, MD  linagliptin (TRADJENTA) 5 MG TABS tablet Take 1 tablet (5 mg total) by mouth daily. 06/06/16  Yes Plotnikov, Evie Lacks, MD  lovastatin (MEVACOR) 20 MG tablet Take 1 tablet (20 mg total) by mouth daily. For Cholesterol 03/29/16  Yes Angiulli, Lavon Paganini, PA-C  metoprolol succinate (TOPROL-XL) 25 MG 24 hr tablet Take 3 tablets (75 mg total) by mouth daily. Take with or immediately following a meal. 06/27/16  Yes Plotnikov, Evie Lacks, MD  repaglinide (PRANDIN) 1 MG tablet Take 1 tablet (1 mg total) by mouth 3 (three) times daily before meals. 06/13/16  Yes Plotnikov, Evie Lacks, MD  Skin Protectants, Misc. (EUCERIN) cream Apply 1 application topically 2 (two) times daily as needed for dry skin.   Yes [provider]    Family History Family History  Problem Relation Age of Onset  . Hypertension Mother   . Kidney disease Father   . Hypertension Father   . Coronary artery disease Unknown        1st degree Male relative <60  . Diabetes Unknown        1st degree relative    Social History Social History  Substance Use Topics  . Smoking status: Former Research scientist (life sciences)  . Smokeless tobacco: Never Used  . Alcohol use No     Allergies   Hydroxyzine; Verapamil; and Benazepril hcl   Review of Systems Review of Systems All other systems negative except as documented in the HPI. All pertinent positives and negatives as reviewed in the HPI.  Physical Exam Updated Vital Signs BP (!) 154/81   Pulse 80   Temp 98 F (36.7 C) (Oral)   Resp 18   Ht 5\' 7"  (1.702 m)   SpO2 98%   Physical Exam  Constitutional: He is oriented to person, place, and time. He appears well-developed and well-nourished. No distress.  HENT:  Head: Normocephalic and atraumatic.  Mouth/Throat: Oropharynx is clear and moist.  Eyes: Pupils are equal, round, and reactive to light.  Neck: Normal range of motion. Neck supple.  Cardiovascular: Normal  rate, regular rhythm and normal heart sounds.  Exam reveals no gallop and no friction rub.   No murmur heard. Pulmonary/Chest: Effort normal and breath sounds normal. No respiratory distress. He has no wheezes.  Abdominal: Soft. Bowel sounds are normal. He exhibits no distension. There is no tenderness.  Neurological: He is alert and oriented to person, place, and time. He exhibits normal muscle tone. Coordination normal.  Skin: Skin is warm and dry. Capillary refill takes less than 2 seconds. No rash noted. No erythema.  Psychiatric: He has a normal mood and affect. His behavior is normal.  Nursing note and vitals reviewed.    ED Treatments / Results  Labs (all labs ordered are listed, but  only abnormal results are displayed) Labs Reviewed  BASIC METABOLIC PANEL - Abnormal; Notable for the following:       Result Value   CO2 16 (*)    Glucose, Bld 108 (*)    BUN 21 (*)    Creatinine, Ser 2.15 (*)    Calcium 8.7 (*)    GFR calc non Af Amer 28 (*)    GFR calc Af Amer 32 (*)    All other components within normal limits  URINALYSIS, ROUTINE W REFLEX MICROSCOPIC - Abnormal; Notable for the following:    APPearance CLOUDY (*)    Specific Gravity, Urine >1.030 (*)    Glucose, UA 100 (*)    Hgb urine dipstick SMALL (*)    Ketones, ur 15 (*)    Protein, ur >300 (*)    All other components within normal limits  HEPATIC FUNCTION PANEL - Abnormal; Notable for the following:    Albumin 3.3 (*)    All other components within normal limits  URINALYSIS, MICROSCOPIC (REFLEX) - Abnormal; Notable for the following:    Bacteria, UA RARE (*)    Squamous Epithelial / LPF 0-5 (*)    All other components within normal limits  HEMOGLOBIN A1C - Abnormal; Notable for the following:    Hgb A1c MFr Bld 6.4 (*)    All other components within normal limits  CBC  LIPASE, BLOOD  TROPONIN I  LIPID PANEL  TROPONIN I  TROPONIN I  I-STAT TROPONIN, ED    EKG  EKG  Interpretation  Date/Time:  Thursday October 11 2016 07:12:48 EDT Ventricular Rate:  101 PR Interval:  162 QRS Duration: 96 QT Interval:  350 QTC Calculation: 453 R Axis:   -51 Text Interpretation:  Sinus tachycardia Left axis deviation Nonspecific T wave abnormality Abnormal ECG SINCE LAST TRACING HEART RATE HAS INCREASED Confirmed by Malvin Johns [phone]) on 10/11/2016 7:42:36 AM Also confirmed by Malvin Johns [phone]), editor Drema Pry [phone])  on 10/11/2016 7:45:55 AM       Radiology Dg Chest 2 View  Result Date: 10/11/2016 CLINICAL DATA:  Cough.  Shortness of breath. EXAM: CHEST  2 VIEW COMPARISON:  05/15/2016. FINDINGS: Prior CABG. Cardiomegaly with normal pulmonary vascularity. No focal infiltrate. No pleural effusion or pneumothorax. No acute bony abnormality . Surgical clip right upper quadrant. IMPRESSION: No acute cardiopulmonary disease.  Prior CABG.  Heart size stable. Electronically Signed   By: Marcello Moores  Register   On: 10/11/2016 07:39    Procedures Procedures (including critical care time)  Medications Ordered in ED Medications  FLUoxetine (PROZAC) tablet 20 mg (not administered)  metoprolol succinate (TOPROL-XL) 24 hr tablet 75 mg (not administered)  clopidogrel (PLAVIX) tablet 75 mg (not administered)  hydrALAZINE (APRESOLINE) tablet 10 mg (not administered)  aspirin EC tablet 325 mg (not administered)  cholecalciferol (VITAMIN D) tablet 1,000 Units (not administered)  finasteride (PROSCAR) tablet 5 mg (not administered)  pravastatin (PRAVACHOL) tablet 20 mg (not administered)  acetaminophen (TYLENOL) tablet 650 mg (not administered)  ondansetron (ZOFRAN) injection 4 mg (not administered)  insulin aspart (novoLOG) injection 0-9 Units (not administered)  insulin aspart (novoLOG) injection 0-5 Units (not administered)  0.9 %  sodium chloride infusion (not administered)  enoxaparin (LOVENOX) injection 40 mg (not administered)  gi cocktail  (Maalox,Lidocaine,Donnatal) (not administered)  morphine 4 MG/ML injection 2 mg (not administered)  sucralfate (CARAFATE) tablet 1 g (not administered)  gi cocktail (Maalox,Lidocaine,Donnatal) (30 mLs Oral Given 10/11/16 1233)     Initial Impression / Assessment and  Plan / ED Course  I have reviewed the triage vital signs and the nursing notes.  Pertinent labs & imaging results that were available during my care of the patient were reviewed by me and considered in my medical decision making (see chart for details).     Patient will be admitted to the hospital for this chest pain as he does have extensive cardiac history, along with multiple risk factors.  Patient is questions were answered.  They tried hospitalist, who will follow up with the patient on admission  Final Clinical Impressions(s) / ED Diagnoses   Final diagnoses:  None    New Prescriptions New Prescriptions   No medications on file     Dalia Heading, PA-C 10/11/16 1608    Malvin Johns, MD 10/11/16 1610

## 2016-10-11 NOTE — Consult Note (Signed)
Cardiology Consult Note  Admit date: 10/11/2016 Name: Caleb Palka Sr. 76 y.o.  male DOB:  [birth year] MRN:  [id number]  Today's date:  10/11/2016  Referring Physician:    [facility]  Primary Physician:    Plotnikov  Reason for Consultation:   Chest pain  IMPRESSIONS: 1.  Chest discomfort epigastric radiating to the left anterior chest of uncertain cause with negative enzymes.  EKG has not shown infarction.  It is very difficult to tell if this is anginal pain or not.  He is a poor candidate for intervention due to his renal insufficiency and poor functional status at home. 2.  CAD with previous inferior infarction previous coronary bypass grafting 16 years ago 3.  Diabetes mellitus with nephropathy and retinopathy 4.  Legal blindness 5.  Hypertensive heart disease 6.  Previous history of cerebellar stroke with gait instability  RECOMMENDATION: At the present time he is pain-free and could not really give me much of a history of recent chest pain.  I don't see any value for noninvasive testing at this point.  If he continues to have recurrent symptoms despite maximum medical therapy the next option would be catheterization to determine if he has disease amenable for stenting.  He is at increased risk for complications from catheterization because of his renal insufficiency and diabetes.  His grafts are 77 years old so this is a real possibility for him.  I would intensively treated for reflux and intensify his medical therapy.would increase his beta blocker because of his resting tachycardia and add nitroglycerin to his regimen.  Continue aspirin as well as Plavix.  HISTORY: This 76 year old male has a prior history of an inferior infarction 26 years ago.  He had PCI at that time and in 2002 because of progressive exertional angina was found to have three-vessel disease.  He was admitted to the hospital and underwent bypass grafting with a mammary graft to the LAD, a vein graft to  intermediate, a vein graft to marginal and a vein graft to the posterior descending and posterior lateral branches.  He has now become legally blind and he also has significant chronic kidney disease with creatinine of stage IV now.  He also has known hypertensive heart disease and blood pressure is currently above goal.  He has recently had some worsening indigestion that he has made some calls to the regular physician's office.  When I saw him tonight he could not remember his chest pain history.  According to the record he developed epigastric pain radiating to his left anterior chest described as burning but stated it was different than his reflux and he did state the pain was worse with exertion.  He could not really corroborate the history tonight.  He had a previous stroke earlier this year characterized as a cerebellar stroke due to small vessel disease.  He uses a walker at home and has a caregiver that helps him at home now.  He is legally blind.  He doesn't really have progressive exertional angina that I can tell.  Past Medical History:  Diagnosis Date  . Blind in both eyes   . CAD (coronary artery disease)   . Cholelithiasis   . CTS (carpal tunnel syndrome)    Left  . CVA (cerebral vascular accident) [facility]) ~ 02/2016   "dr says my left side is weaker now; I can'r tell" (10/11/2016)  . DM type 2 with diabetic peripheral neuropathy ([place])   . ED (erectile dysfunction)   . Elevated PSA   .  GERD (gastroesophageal reflux disease)   . Glaucoma, both eyes   . High cholesterol   . HTN (hypertension)   . Inferior MI ([place])    hx/notes 06/06/2010  . 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 MI (myocardial infarction) ([place]) 1991  . Thyroid nodule   . Type II diabetes mellitus ([place])       Past Surgical History:  Procedure Laterality Date          . CORONARY ARTERY BYPASS GRAFT  2002     . EYE SURGERY Bilateral    "did laser on them"  . GLAUCOMA SURGERY Bilateral     several/notes 06/06/2010  . LAPAROSCOPIC CHOLECYSTECTOMY  10/2004   Archie Endo 06/06/2010  . NECK MASS EXCISION Left    "benign"  . VITRECTOMY Right 05/01/2004   Archie Endo 06/06/2010    Allergies:  is allergic to hydroxyzine; verapamil; and benazepril hcl.   Medications: Prior to Admission medications   Medication Sig Start Date End Date Taking? Authorizing Provider  aspirin EC 325 MG EC tablet Take 1 tablet (325 mg total) by mouth daily. 03/29/16  Yes Angiulli, Lavon Paganini, PA-C  cholecalciferol (VITAMIN D) 1000 units tablet Take 1 tablet (1,000 Units total) by mouth daily. 03/29/16  Yes Angiulli, Lavon Paganini, PA-C  clopidogrel (PLAVIX) 75 MG tablet Take 1 tablet (75 mg total) by mouth daily. 06/06/16  Yes Plotnikov, Evie Lacks, MD  finasteride (PROSCAR) 5 MG tablet Take 1 tablet (5 mg total) by mouth daily. For Prostate 03/29/16  Yes Angiulli, Lavon Paganini, PA-C  FLUoxetine (PROZAC) 20 MG tablet Take 1 tablet (20 mg total) by mouth daily. 09/10/16  Yes Plotnikov, Evie Lacks, MD  hydrALAZINE (APRESOLINE) 10 MG tablet Take 1 tablet (10 mg total) by mouth every 8 (eight) hours. 06/06/16  Yes Plotnikov, Evie Lacks, MD  linagliptin (TRADJENTA) 5 MG TABS tablet Take 1 tablet (5 mg total) by mouth daily. 06/06/16  Yes Plotnikov, Evie Lacks, MD  lovastatin (MEVACOR) 20 MG tablet Take 1 tablet (20 mg total) by mouth daily. For Cholesterol 03/29/16  Yes Angiulli, Lavon Paganini, PA-C  metoprolol succinate (TOPROL-XL) 25 MG 24 hr tablet Take 3 tablets (75 mg total) by mouth daily. Take with or immediately following a meal. 06/27/16  Yes Plotnikov, Evie Lacks, MD  repaglinide (PRANDIN) 1 MG tablet Take 1 tablet (1 mg total) by mouth 3 (three) times daily before meals. 06/13/16  Yes Plotnikov, Evie Lacks, MD  Skin Protectants, Misc. (EUCERIN) cream Apply 1 application topically 2 (two) times daily as needed for dry skin.   Yes [provider]    Family History: Family Status  Relation Status  . Mother Deceased  . Father Deceased  .  Unknown (Not Specified)    Social History:   reports that he quit smoking about 27 years ago. His smoking use included Cigarettes. He has a 70.00 pack-year smoking history. He has never used smokeless tobacco. He reports that he does not drink alcohol or use drugs.   Social History   Social History Narrative   Regular Exercise-No   Married, but separated  currently lives with his sister  Review of Systems: He is legally blind from diabetes.  He has erectile dysfunction.  He has significant difficulty with gait instability and needs to use a walker since his stroke.  He has urinary frequency and hesitancy.  He has had frequent indigestion recently.  Other than as noted above the remainder of the review of systems is unremarkable.  Physical Exam: BP (!) 163/75 (BP Location: Left Arm)   Pulse 84   Temp 97.7 F (36.5 C) (Oral)   Resp 18   Ht 5\' 6"  (1.676 m)   Wt 85.9 kg (189 lb 4.8 oz)   SpO2 100%   BMI 30.55 kg/m   General appearance: he is a pleasant black male appearing stated age in no acute distress Head: Normocephalic, without obvious abnormality, atraumatic Eyes: is left pupil is somewhat opaque.  He does not have good vision in the right eye Neck: no adenopathy, no carotid bruit, no JVD, supple, symmetrical, trachea midline and healed left neck scar Lungs: clear to auscultation bilaterally, healed midline scar Heart: regular rate and rhythm, S1, S2 normal, no murmur, click, rub or gallop Abdomen: soft, non-tender; bowel sounds normal; no masses,  no organomegaly Rectal: deferred Extremities: extremities normal, atraumatic, no cyanosis or edema, previous scar from saphenous vein harvesting Pulses: 2+ and symmetric Skin: Skin color, texture, turgor normal. No rashes or lesions Neurologic: Grossly normal Psych: Alert and oriented x 3 Labs: CBC  Recent Labs  10/11/16 0714  WBC 6.9  RBC 5.23  HGB 13.6  HCT 43.2  PLT 219  MCV 82.6  MCH 26.0  MCHC 31.5  RDW 14.0    CMP   Recent Labs  10/11/16 0714  NA 136  K 3.9  CL 108  CO2 16*  GLUCOSE 108*  BUN 21*  CREATININE 2.15*  CALCIUM 8.7*  PROT 7.6  ALBUMIN 3.3*  AST 22  ALT 33  ALKPHOS 76  BILITOT 0.5  GFRNONAA 28*  GFRAA 32*   BNP (last 3 results) BNP    Component Value Date/Time   BNP 108.6 (H) 05/15/2016 1614   Cardiac Panel (last 3 results) Troponin (Point of Care Test)  Recent Labs  10/11/16 0735  TROPIPOC 0.02   Cardiac Panel (last 3 results)  Recent Labs  10/11/16 1235 10/11/16 1810 10/11/16 2119  TROPONINI <0.03 <0.03 <0.03     Radiology:  Cardiomegaly, clear lungs, previous bypass grafting  EKG: Sinus tachycardia, lateral T-wave inversion, left axis deviation Independently reviewed by me  Signed:  W. Doristine Church MD [facility]   Cardiology Consultant  10/11/2016, 10:46 PM

## 2016-10-11 NOTE — H&P (Signed)
History and Physical    Caleb Tuite Sr. ACZ:[id number] DOB: 1940/03/18 DOA: 10/11/2016  PCP: Cassandria Anger, MD Patient coming from: home  Chief Complaint: chest pain  HPI: Caleb Semper Sr. is a very pleasant 76 y.o. male with medical history significant for hypertension, diabetes, CAD status post CABG, CVA in February 2018, legally blind, GERD presents to the emergency department from home with the chief complaint of chest pain. [facility] admitting for chest pain rule out  Information is obtained from the patient. He complains of chest pain located epigastric area radiates to left anterior chest. Describes it as a burning. He endorses chronic reflux but states this is little different. He states the pain is worse with exertion and tends to get better at rest. Associated symptoms include shortness of breath particularly with activity. He denies headache dizziness syncope or near-syncope. He denies any nausea vomiting lower extremity edema or orthopnea. He denies dysuria hematuria frequency or urgency. He denies diarrhea constipation melena bright red blood per rectum. He reports he ambulates at home with a walker and has a caregiver to assist him with activities of daily living. He denies any unintentional weight loss fever chills recent travel or sick contacts   ED Course: In the emergency department he's afebrile hemodynamically stable with a blood pressure at the high end of normal. He's not hypoxic.  Review of Systems: As per HPI otherwise all other systems reviewed and are negative.   Ambulatory Status: Ambulates with a walker. No recent falls. He is legally blind and has assistance at home  Past Medical History:  Diagnosis Date  . Blindness   . CAD (coronary artery disease)   . Cholelithiasis   . CTS (carpal tunnel syndrome)    Left  . DM type 2 with diabetic peripheral neuropathy ([place])   . ED (erectile dysfunction)   . Elevated PSA   . GERD (gastroesophageal  reflux disease)   . Glaucoma   . HTN (hypertension)   . 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 MI (myocardial infarction) ([place]) 1991  . Thyroid nodule   . Type II or unspecified type diabetes mellitus without mention of complication, not stated as uncontrolled     Past Surgical History:  Procedure Laterality Date  . CHOLECYSTECTOMY      Social History   Social History  . Marital status: Legally Separated    Spouse name: N/A  . Number of children: N/A  . Years of education: N/A   Occupational History  . Retired    Social History Main Topics  . Smoking status: Former Research scientist (life sciences)  . Smokeless tobacco: Never Used  . Alcohol use No  . Drug use: No  . Sexual activity: No   Other Topics Concern  . Not on file   Social History Narrative   Regular Exercise-No   Married, but separated    Allergies  Allergen Reactions  . Hydroxyzine Other (See Comments)    Visual hallucinations  . Verapamil Other (See Comments)    Hallucinations   . Benazepril Hcl Cough    Family History  Problem Relation Age of Onset  . Hypertension Mother   . Kidney disease Father   . Hypertension Father   . Coronary artery disease Unknown        1st degree Male relative <60  . Diabetes Unknown        1st degree relative    Prior to Admission medications   Medication Sig Start Date End Date  Taking? Authorizing Provider  aspirin EC 325 MG EC tablet Take 1 tablet (325 mg total) by mouth daily. 03/29/16  Yes Angiulli, Lavon Paganini, PA-C  cholecalciferol (VITAMIN D) 1000 units tablet Take 1 tablet (1,000 Units total) by mouth daily. 03/29/16  Yes Angiulli, Lavon Paganini, PA-C  clopidogrel (PLAVIX) 75 MG tablet Take 1 tablet (75 mg total) by mouth daily. 06/06/16  Yes Plotnikov, Evie Lacks, MD  finasteride (PROSCAR) 5 MG tablet Take 1 tablet (5 mg total) by mouth daily. For Prostate 03/29/16  Yes Angiulli, Lavon Paganini, PA-C  FLUoxetine (PROZAC) 20 MG tablet Take 1 tablet (20 mg total) by mouth daily. 09/10/16   Yes Plotnikov, Evie Lacks, MD  hydrALAZINE (APRESOLINE) 10 MG tablet Take 1 tablet (10 mg total) by mouth every 8 (eight) hours. 06/06/16  Yes Plotnikov, Evie Lacks, MD  linagliptin (TRADJENTA) 5 MG TABS tablet Take 1 tablet (5 mg total) by mouth daily. 06/06/16  Yes Plotnikov, Evie Lacks, MD  lovastatin (MEVACOR) 20 MG tablet Take 1 tablet (20 mg total) by mouth daily. For Cholesterol 03/29/16  Yes Angiulli, Lavon Paganini, PA-C  metoprolol succinate (TOPROL-XL) 25 MG 24 hr tablet Take 3 tablets (75 mg total) by mouth daily. Take with or immediately following a meal. 06/27/16  Yes Plotnikov, Evie Lacks, MD  repaglinide (PRANDIN) 1 MG tablet Take 1 tablet (1 mg total) by mouth 3 (three) times daily before meals. 06/13/16  Yes Plotnikov, Evie Lacks, MD  Skin Protectants, Misc. (EUCERIN) cream Apply 1 application topically 2 (two) times daily as needed for dry skin.   Yes [provider]    Physical Exam: Vitals:   10/11/16 1030 10/11/16 1100 10/11/16 1130 10/11/16 1200  BP: (!) 182/85 (!) 176/80 (!) 188/76 (!) 174/94  Pulse: 80 80 79 78  Resp: 19 (!) 7 14 11   Temp:      TempSrc:      SpO2: 100% 98% 98% 100%  Height:         General:  Appears calm and comfortable In no acute distress Eyes:  PERRL, EOMI, normal lids, iris ENT:  grossly normal hearing, lips & tongue, mucous membranes of his mouth are moist and pink. Very poor dentition Neck:  no LAD, masses or thyromegaly Cardiovascular:  RRR, no m/r/g. No LE edema.  Respiratory:  No increased work of breathing. Breath sounds somewhat distant slightly coarse. Hear no crackles or wheezes Abdomen:  soft, ntnd, no guarding or rebounding Skin:  no rash or induration seen on limited exam Musculoskeletal:  grossly normal tone BUE/BLE, good ROM, no bony abnormality Psychiatric:  grossly normal mood and affect, speech fluent and appropriate, AOx3 Neurologic:  Speech clear facial symmetry follows commands moving all extremities spontaneously  Labs on  Admission: I have personally reviewed following labs and imaging studies  CBC:  Recent Labs Lab 10/11/16 0714  WBC 6.9  HGB 13.6  HCT 43.2  MCV 82.6  PLT 700   Basic Metabolic Panel:  Recent Labs Lab 10/11/16 0714  NA 136  K 3.9  CL 108  CO2 16*  GLUCOSE 108*  BUN 21*  CREATININE 2.15*  CALCIUM 8.7*   GFR: CrCl cannot be calculated (Unknown ideal weight.). Liver Function Tests:  Recent Labs Lab 10/11/16 0714  AST 22  ALT 33  ALKPHOS 76  BILITOT 0.5  PROT 7.6  ALBUMIN 3.3*    Recent Labs Lab 10/11/16 0714  LIPASE 32   No results for input(s): AMMONIA in the last 168 hours. Coagulation Profile: No  results for input(s): INR, PROTIME in the last 168 hours. Cardiac Enzymes: No results for input(s): CKTOTAL, CKMB, CKMBINDEX, TROPONINI in the last 168 hours. BNP (last 3 results) No results for input(s): PROBNP in the last 8760 hours. HbA1C: No results for input(s): HGBA1C in the last 72 hours. CBG: No results for input(s): GLUCAP in the last 168 hours. Lipid Profile: No results for input(s): CHOL, HDL, LDLCALC, TRIG, CHOLHDL, LDLDIRECT in the last 72 hours. Thyroid Function Tests: No results for input(s): TSH, T4TOTAL, FREET4, T3FREE, THYROIDAB in the last 72 hours. Anemia Panel: No results for input(s): VITAMINB12, FOLATE, FERRITIN, TIBC, IRON, RETICCTPCT in the last 72 hours. Urine analysis:    Component Value Date/Time   COLORURINE YELLOW 10/11/2016 1044   APPEARANCEUR CLOUDY (A) 10/11/2016 1044   LABSPEC >1.030 (H) 10/11/2016 1044   PHURINE 5.5 10/11/2016 1044   GLUCOSEU 100 (A) 10/11/2016 1044   GLUCOSEU NEGATIVE 11/01/2015 1709   HGBUR SMALL (A) 10/11/2016 1044   BILIRUBINUR NEGATIVE 10/11/2016 1044   KETONESUR 15 (A) 10/11/2016 1044   PROTEINUR >300 (A) 10/11/2016 1044   UROBILINOGEN 0.2 11/01/2015 1709   NITRITE NEGATIVE 10/11/2016 1044   LEUKOCYTESUR NEGATIVE 10/11/2016 1044    Creatinine Clearance: CrCl cannot be calculated  (Unknown ideal weight.).  Sepsis Labs: @LABRCNTIP (procalcitonin:4,lacticidven:4) )No results found for this or any previous visit (from the past 240 hour(s)).   Radiological Exams on Admission: Dg Chest 2 View  Result Date: 10/11/2016 CLINICAL DATA:  Cough.  Shortness of breath. EXAM: CHEST  2 VIEW COMPARISON:  05/15/2016. FINDINGS: Prior CABG. Cardiomegaly with normal pulmonary vascularity. No focal infiltrate. No pleural effusion or pneumothorax. No acute bony abnormality . Surgical clip right upper quadrant. IMPRESSION: No acute cardiopulmonary disease.  Prior CABG.  Heart size stable. Electronically Signed   By: Marcello Moores  Register   On: 10/11/2016 07:39    EKG: Independently reviewed. Sinus tachycardia Left axis deviation Nonspecific T wave abnormality  Assessment/Plan Principal Problem:   Chest pain Active Problems:   Hyperlipidemia   Coronary atherosclerosis   GERD   Chronic kidney disease (CKD), stage IV (severe) (HCC)   Essential hypertension   Blind   Type 2 diabetes mellitus with peripheral neuropathy ([place])   History of stroke   #1. Chest pain. Some typical and atypical features. Patient with long history of GERD. So history of CAD status post CABG. Initial troponin negative. EKG without acute abnormalities. Medications include aspirin and statin. He states he's had a stress test in the past but cannot remember when or the results. He is pain-free on admission -Admit to telemetry -Cycle troponin -Serial EKG -Continue aspirin and statin -GI cocktail -Morphine and Zofran as needed -Cardiology consult  #2. Chronic kidney disease stage IV. Creatinine 2.1 on admission. Review indicates this appears to be close to his baseline. -Hold nephrotoxins -Monitor urine output -Gentle IV fluids -Recheck in the morning  #3. Hypertension. Blood pressure on the high end of normal in the emergency department. Home medications include hydralazine metoprolol -Continue home  meds -Monitor blood pressure  #4. Diabetes. Serum glucose 108 on admission. Home medications include oral agents only. -Hold oral agents for now -Obtain a hemoglobin A1c -Sliding scale insulin for optimal control  #5. History of stroke. CVA early 2018. Neuro exam benign -Continue Plavix  #6. CAD status post CABG. See #1 -continue home meds  #7. GERD. Chronic. Chart review indicates patient's been managing this problem for a while. Home medications include protonic's -GI cocktail as noted above  DVT prophylaxis: plavix  Code Status: full  Family Communication: none present  Disposition Plan: back home  Consults called: card master  Admission status: obs    Radene Gunning MD [facility]  If 7PM-7AM, please contact night-coverage [email or web address] Password [facility]  10/11/2016, 12:53 PM

## 2016-10-12 DIAGNOSIS — K210 Gastro-esophageal reflux disease with esophagitis: Secondary | ICD-10-CM | POA: Diagnosis not present

## 2016-10-12 DIAGNOSIS — I10 Essential (primary) hypertension: Secondary | ICD-10-CM

## 2016-10-12 DIAGNOSIS — R0789 Other chest pain: Secondary | ICD-10-CM

## 2016-10-12 DIAGNOSIS — E785 Hyperlipidemia, unspecified: Secondary | ICD-10-CM

## 2016-10-12 DIAGNOSIS — E1142 Type 2 diabetes mellitus with diabetic polyneuropathy: Secondary | ICD-10-CM | POA: Diagnosis not present

## 2016-10-12 DIAGNOSIS — N184 Chronic kidney disease, stage 4 (severe): Secondary | ICD-10-CM | POA: Diagnosis not present

## 2016-10-12 LAB — GLUCOSE, CAPILLARY
GLUCOSE-CAPILLARY: 115 mg/dL — AB (ref 65–99)
Glucose-Capillary: 97 mg/dL (ref 65–99)

## 2016-10-12 MED ORDER — PANTOPRAZOLE SODIUM 40 MG PO TBEC: 40.0000 mg | DELAYED_RELEASE_TABLET | Freq: Two times a day (BID) | ORAL | 2 refills | Status: DC

## 2016-10-12 MED ORDER — METOPROLOL SUCCINATE ER 100 MG PO TB24: 100.0000 mg | ORAL_TABLET | Freq: Every day | ORAL | 3 refills | Status: AC

## 2016-10-12 MED ORDER — RANITIDINE HCL 150 MG PO TABS: 150.0000 mg | ORAL_TABLET | Freq: Every day | ORAL | 1 refills | Status: AC

## 2016-10-12 MED ORDER — ENOXAPARIN SODIUM 30 MG/0.3ML ~~LOC~~ SOLN: 30.0000 mg | SUBCUTANEOUS | Status: DC

## 2016-10-12 MED ORDER — ASPIRIN EC 81 MG PO TBEC: 81.0000 mg | DELAYED_RELEASE_TABLET | Freq: Every day | ORAL | 3 refills | Status: AC

## 2016-10-12 MED ORDER — ISOSORBIDE MONONITRATE ER 30 MG PO TB24: 30.0000 mg | ORAL_TABLET | Freq: Every day | ORAL | 3 refills | Status: AC

## 2016-10-12 NOTE — Progress Notes (Signed)
Subjective:  No recurrent chest pain overnight.  Tolerating increased dose of beta blocker well.  Objective:  Vital Signs in the last 24 hours: BP 103/65   Pulse 66   Temp 97.8 F (36.6 C) (Oral)   Resp 20   Ht 5\' 6"  (1.676 m)   Wt 85.3 kg (188 lb)   SpO2 98%   BMI 30.34 kg/m   Physical Exam: Elderly black male distress currently blind Lungs:  Clear Cardiac:  Regular rhythm, normal S1 and S2, no S3 Extremities:  No edema present  Intake/Output from previous day: 09/20 0701 - 09/21 0700 In: 420 [P.O.:420] Out: 415 [Urine:415]  Weight Filed Weights   10/11/16 1804 10/12/16 0543  Weight: 85.9 kg (189 lb 4.8 oz) 85.3 kg (188 lb)    Lab Results: Basic Metabolic Panel:  Recent Labs  10/11/16 0714  NA 136  K 3.9  CL 108  CO2 16*  GLUCOSE 108*  BUN 21*  CREATININE 2.15*   CBC:  Recent Labs  10/11/16 0714  WBC 6.9  HGB 13.6  HCT 43.2  MCV 82.6  PLT 219   Cardiac Enzymes: Troponin (Point of Care Test)  Recent Labs  10/11/16 0735  TROPIPOC 0.02   Cardiac Panel (last 3 results)  Recent Labs  10/11/16 1235 10/11/16 1810 10/11/16 2119  TROPONINI <0.03 <0.03 <0.03    Telemetry: Sinus rhythm.  Assessment/Plan:  1.  Chest pain myocardial infarction ruled out unclear if this is cardiac pain or not 2.  CAD with remote bypass grafting 3.  Aortic atherosclerosis 4.  Diabetes mellitus with retinopathy and nephropathy  Recommendations:  No recurrence of chest pain overnight.  As mentioned in my consult note last night would not pursue further workup unless he fails medical therapy.  His functional status is not great at home and although he could have graft disease he would be best to treat him medically because he stands a risk of renal insufficiency with contrast load if needed for catheterization.  I don't think that a noninvasive workup will be helpful here.     Kerry Hough  MD [facility]  10/12/2016, 1:52 PM

## 2016-10-12 NOTE — Discharge Summary (Signed)
Physician Discharge Summary  Caleb Signorelli Sr. PZW:[id number] DOB: 09-30-1940 DOA: 10/11/2016  PCP: Cassandria Anger, MD  Admit date: 10/11/2016 Discharge date: 10/12/2016  Time spent: 35 minutes  Recommendations for Outpatient Follow-up:  Repeat BMET to follow electrolytes and renal function  Close follow up to patient CBG's diabetes; adjust hypoglycemic regimen as needed   Discharge Diagnoses:  Principal Problem:   Chest pain Active Problems:   Hyperlipidemia   Coronary atherosclerosis   GERD   Chronic kidney disease (CKD), stage IV (severe) ([place])   Essential hypertension   Blind   Type 2 diabetes mellitus with peripheral neuropathy ([name])   History of stroke   Discharge Condition: stable and improved. Discharge home with instructions to follow up with PCP in 10 days.  Diet recommendation: heart healthy and modified carbohydrates diet   Filed Weights   10/11/16 1804 10/12/16 0543  Weight: 85.9 kg (189 lb 4.8 oz) 85.3 kg (188 lb)    History of present illness:  Caleb Caleb Sr. is a very pleasant 76 y.o. male with medical history significant for hypertension, diabetes, CAD status post CABG, CVA in February 2018, legally blind, GERD presents to the emergency department from home with the chief complaint of chest pain. [facility] admitting for chest pain rule out.  Information is obtained from the patient. He complains of chest pain located epigastric area radiates to left anterior chest. Describes it as a burning. He endorses chronic reflux but states this is little different. He states the pain is worse with exertion and tends to get better at rest. Associated symptoms include shortness of breath particularly with activity. He denies headache dizziness syncope or near-syncope. He denies any nausea vomiting lower extremity edema or orthopnea. He denies dysuria hematuria frequency or urgency. He denies diarrhea constipation melena bright red blood per  rectum.  Hospital Course:  1-CP: atypical with typical features and with heart score of 5 -no EKG or telemetry changes for ischemia -neg troponin  -per cardiology no further ischemic work up needed -b-blocker dose adjusted and patient started on Imdur -patient's PPI also adjusted for better GI control and was started on zantac QHS.  2-CKD stage 4 -remains stable -Advise to keep himself well hydrated and nephrotoxic agents were minimized/discontinued -repeat BMET to follow electrolytes and renal function trend   3-HTN -BP stable -continue new initiated meds and adjusted b-blocker dose -patient advise to follow heart healthy diet  4-HLD -continue statins  5-hx of stroke -will continue plavix  6-diabetes type 2 with nephropathy  -will continue home hypoglycemic regimen  -encourage to follow low carb diet   7-BPH -continue proscar  8-depression -continue prozac   Procedures:  See below for x-ray reports   Consultations:  Cardiology   Discharge Exam: Vitals:   10/12/16 0543 10/12/16 0945  BP: (!) 168/87 140/74  Pulse: 70 66  Resp: 20   Temp: 97.8 F (36.6 C)   SpO2: 98%     General: afebrile, no CP, no SOB. No nausea, no vomiting. Cardiovascular: S1 and S2, no rubs, no gallops Respiratory: good air movement, no wheezing, no crackles Abd: soft, NT, ND, positive BS Extremities: no edema, no cyanosis   Discharge Instructions   Discharge Instructions    Diet - low sodium heart healthy    Complete by:  As directed    Discharge instructions    Complete by:  As directed    Arrange follow up with PCP in 10 days Take medications as prescribed  Follow lifestyle changes  to assist with reflux symptoms. Maintain adequate hydration     Current Discharge Medication List    START taking these medications   Details  isosorbide mononitrate (IMDUR) 30 MG 24 hr tablet Take 1 tablet (30 mg total) by mouth daily. Qty: 30 tablet, Refills: 3    pantoprazole  (PROTONIX) 40 MG tablet Take 1 tablet (40 mg total) by mouth 2 (two) times daily. Qty: 60 tablet, Refills: 2    ranitidine (ZANTAC) 150 MG tablet Take 1 tablet (150 mg total) by mouth at bedtime. Qty: 30 tablet, Refills: 1      CONTINUE these medications which have CHANGED   Details  aspirin 81 MG tablet Take 1 tablet (81 mg total) by mouth daily. Qty: 30 tablet, Refills: 3    metoprolol succinate (TOPROL-XL) 100 MG 24 hr tablet Take 1 tablet (100 mg total) by mouth daily. Take with or immediately following a meal. Qty: 30 tablet, Refills: 3      CONTINUE these medications which have NOT CHANGED   Details  cholecalciferol (VITAMIN D) 1000 units tablet Take 1 tablet (1,000 Units total) by mouth daily. Qty: 100 tablet, Refills: 3    clopidogrel (PLAVIX) 75 MG tablet Take 1 tablet (75 mg total) by mouth daily. Qty: 90 tablet, Refills: 1    finasteride (PROSCAR) 5 MG tablet Take 1 tablet (5 mg total) by mouth daily. For Prostate Qty: 90 tablet, Refills: 3    FLUoxetine (PROZAC) 20 MG tablet Take 1 tablet (20 mg total) by mouth daily. Qty: 30 tablet, Refills: 11    hydrALAZINE (APRESOLINE) 10 MG tablet Take 1 tablet (10 mg total) by mouth every 8 (eight) hours. Qty: 90 tablet, Refills: 1    linagliptin (TRADJENTA) 5 MG TABS tablet Take 1 tablet (5 mg total) by mouth daily. Qty: 90 tablet, Refills: 1    lovastatin (MEVACOR) 20 MG tablet Take 1 tablet (20 mg total) by mouth daily. For Cholesterol Qty: 90 tablet, Refills: 3    repaglinide (PRANDIN) 1 MG tablet Take 1 tablet (1 mg total) by mouth 3 (three) times daily before meals. Qty: 90 tablet, Refills: 11    Skin Protectants, Misc. (EUCERIN) cream Apply 1 application topically 2 (two) times daily as needed for dry skin.       Allergies  Allergen Reactions  . Hydroxyzine Other (See Comments)    Visual hallucinations  . Verapamil Other (See Comments)    Hallucinations   . Benazepril Hcl Cough   Follow-up Information     Plotnikov, Evie Lacks, MD. Schedule an appointment as soon as possible for a visit in 10 day(s).   Specialty:  Internal Medicine Contact information: [place] [address] [phone]            The results of significant diagnostics from this hospitalization (including imaging, microbiology, ancillary and laboratory) are listed below for reference.    Significant Diagnostic Studies: Dg Chest 2 View  Result Date: 10/11/2016 CLINICAL DATA:  Cough.  Shortness of breath. EXAM: CHEST  2 VIEW COMPARISON:  05/15/2016. FINDINGS: Prior CABG. Cardiomegaly with normal pulmonary vascularity. No focal infiltrate. No pleural effusion or pneumothorax. No acute bony abnormality . Surgical clip right upper quadrant. IMPRESSION: No acute cardiopulmonary disease.  Prior CABG.  Heart size stable. Electronically Signed   By: Marcello Moores  Register   On: 10/11/2016 07:39    Microbiology: No results found for this or any previous visit (from the past 240 hour(s)).   Labs: Basic Metabolic Panel:  Recent Labs Lab 10/11/16 0714  NA 136  K 3.9  CL 108  CO2 16*  GLUCOSE 108*  BUN 21*  CREATININE 2.15*  CALCIUM 8.7*   Liver Function Tests:  Recent Labs Lab 10/11/16 0714  AST 22  ALT 33  ALKPHOS 76  BILITOT 0.5  PROT 7.6  ALBUMIN 3.3*    Recent Labs Lab 10/11/16 0714  LIPASE 32   No results for input(s): AMMONIA in the last 168 hours. CBC:  Recent Labs Lab 10/11/16 0714  WBC 6.9  HGB 13.6  HCT 43.2  MCV 82.6  PLT 219   Cardiac Enzymes:  Recent Labs Lab 10/11/16 1235 10/11/16 1810 10/11/16 2119  TROPONINI <0.03 <0.03 <0.03   BNP: BNP (last 3 results)  Recent Labs  05/15/16 1614  BNP 108.6*    ProBNP (last 3 results) No results for input(s): PROBNP in the last 8760 hours.  CBG:  Recent Labs Lab 10/11/16 1845 10/11/16 2109 10/12/16 0813 10/12/16 1226  GLUCAP 106* 189* 115* 97       Signed:  Barton Dubois MD.  [facility] 10/12/2016, 1:10 PM

## 2016-10-12 NOTE — Discharge Instructions (Signed)
Chest Wall Pain °Chest wall pain is pain in or around the bones and muscles of your chest. Sometimes, an injury causes this pain. Sometimes, the cause may not be known. This pain may take several weeks or longer to get better. °Follow these instructions at home: °Pay attention to any changes in your symptoms. Take these actions to help with your pain: °· Rest as told by your doctor. °· Avoid activities that cause pain. Try not to use your chest, belly (abdominal), or side muscles to lift heavy things. °· If directed, apply ice to the painful area: °? Put ice in a plastic bag. °? Place a towel between your skin and the bag. °? Leave the ice on for 20 minutes, 2-3 times per day. °· Take over-the-counter and prescription medicines only as told by your doctor. °· Do not use tobacco products, including cigarettes, chewing tobacco, and e-cigarettes. If you need help quitting, ask your doctor. °· Keep all follow-up visits as told by your doctor. This is important. ° °Contact a doctor if: °· You have a fever. °· Your chest pain gets worse. °· You have new symptoms. °Get help right away if: °· You feel sick to your stomach (nauseous) or you throw up (vomit). °· You feel sweaty or light-headed. °· You have a cough with phlegm (sputum) or you cough up blood. °· You are short of breath. °This information is not intended to replace advice given to you by your health care provider. Make sure you discuss any questions you have with your health care provider. °Document Released: 06/27/2007 Document Revised: 06/16/2015 Document Reviewed: 04/05/2014 °Elsevier Interactive Patient Education © 2018 [employer] ° °

## 2016-10-19 ENCOUNTER — Ambulatory Visit (INDEPENDENT_AMBULATORY_CARE_PROVIDER_SITE_OTHER): Payer: Medicare HMO | Admitting: Nurse Practitioner

## 2016-10-19 ENCOUNTER — Other Ambulatory Visit (INDEPENDENT_AMBULATORY_CARE_PROVIDER_SITE_OTHER): Payer: Medicare HMO

## 2016-10-19 ENCOUNTER — Encounter: Payer: Self-pay | Admitting: Nurse Practitioner

## 2016-10-19 VITALS — BP 120/64 | HR 60 | Temp 97.5°F | Ht 66.0 in | Wt 192.0 lb

## 2016-10-19 DIAGNOSIS — E1139 Type 2 diabetes mellitus with other diabetic ophthalmic complication: Secondary | ICD-10-CM | POA: Diagnosis not present

## 2016-10-19 DIAGNOSIS — K2100 Gastro-esophageal reflux disease with esophagitis, without bleeding: Secondary | ICD-10-CM

## 2016-10-19 DIAGNOSIS — N184 Chronic kidney disease, stage 4 (severe): Secondary | ICD-10-CM

## 2016-10-19 LAB — BASIC METABOLIC PANEL
BUN: 20 mg/dL (ref 6–23)
CHLORIDE: 105 meq/L (ref 96–112)
CO2: 24 mEq/L (ref 19–32)
Calcium: 8.9 mg/dL (ref 8.4–10.5)
Creatinine, Ser: 2.11 mg/dL — ABNORMAL HIGH (ref 0.40–1.50)
GFR: 39.36 mL/min — ABNORMAL LOW (ref >60.00)
GLUCOSE: 120 mg/dL — AB (ref 70–99)
POTASSIUM: 4.3 meq/L (ref 3.5–5.1)
Sodium: 135 mEq/L (ref 135–145)

## 2016-10-19 NOTE — Patient Instructions (Addendum)
Hold tradjenta Continue to check glucose once a day and record. Call office if glucose >200 for more than 2days.  Stable kidney function and electrolytes. Continue pantoprazole before breakfast and ranitidine at HS.
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
## 2016-11-01 ENCOUNTER — Encounter: Payer: Self-pay | Admitting: Nurse Practitioner

## 2016-11-01 ENCOUNTER — Ambulatory Visit (INDEPENDENT_AMBULATORY_CARE_PROVIDER_SITE_OTHER): Payer: Medicare HMO | Admitting: Nurse Practitioner

## 2016-11-01 VITALS — BP 126/70 | HR 62 | Temp 97.7°F | Ht 66.0 in | Wt 195.0 lb

## 2016-11-01 DIAGNOSIS — R296 Repeated falls: Secondary | ICD-10-CM | POA: Diagnosis not present

## 2016-11-01 DIAGNOSIS — E1142 Type 2 diabetes mellitus with diabetic polyneuropathy: Secondary | ICD-10-CM

## 2016-11-01 DIAGNOSIS — M6281 Muscle weakness (generalized): Secondary | ICD-10-CM | POA: Diagnosis not present

## 2016-11-01 DIAGNOSIS — R2689 Other abnormalities of gait and mobility: Secondary | ICD-10-CM | POA: Diagnosis not present

## 2016-11-01 MED ORDER — DOCUSATE SODIUM 100 MG PO CAPS: 100.0000 mg | ORAL_CAPSULE | Freq: Two times a day (BID) | ORAL | 0 refills | Status: AC

## 2016-11-01 NOTE — Progress Notes (Signed)
Subjective:  Patient ID: Caleb Nim Sr., male    DOB: Jun 16, 1940  Age: 76 y.o. MRN: [id number]  CC: Follow-up (FL 2 consult. )   HPI Accompanied by Son  Caleb Dunn presents for evaluation for Skilled nursing facility admission. He reports frequent falls due to unsteady gait, generalized weakness and being legally blind. He is at home alone and no longer has a caregiver to assist him. He ambulates with cane, but has difficulty with ADLs (bathing, and dressing especially). He also has a fear of falling so he minimizes his movement around the house. His son also reports intermittent periods of confusion, but has never wandered out of home. He reports that this has been going on for years but gotten worse with CVA and MI.  They are seeking admission into [facility] for long term care and rehab.  Outpatient Medications Prior to Visit  Medication Sig Dispense Refill  . aspirin 81 MG tablet Take 1 tablet (81 mg total) by mouth daily. 30 tablet 3  . cholecalciferol (VITAMIN D) 1000 units tablet Take 1 tablet (1,000 Units total) by mouth daily. 100 tablet 3  . clopidogrel (PLAVIX) 75 MG tablet Take 1 tablet (75 mg total) by mouth daily. 90 tablet 1  . finasteride (PROSCAR) 5 MG tablet Take 1 tablet (5 mg total) by mouth daily. For Prostate 90 tablet 3  . FLUoxetine (PROZAC) 20 MG tablet Take 1 tablet (20 mg total) by mouth daily. 30 tablet 11  . hydrALAZINE (APRESOLINE) 10 MG tablet Take 1 tablet (10 mg total) by mouth every 8 (eight) hours. 90 tablet 1  . isosorbide mononitrate (IMDUR) 30 MG 24 hr tablet Take 1 tablet (30 mg total) by mouth daily. 30 tablet 3  . lovastatin (MEVACOR) 20 MG tablet Take 1 tablet (20 mg total) by mouth daily. For Cholesterol 90 tablet 3  . metoprolol succinate (TOPROL-XL) 100 MG 24 hr tablet Take 1 tablet (100 mg total) by mouth daily. Take with or immediately following a meal. 30 tablet 3  . pantoprazole (PROTONIX) 40 MG tablet Take 1 tablet (40 mg total)  by mouth daily. 90 tablet 1  . ranitidine (ZANTAC) 150 MG tablet Take 1 tablet (150 mg total) by mouth at bedtime. 30 tablet 1  . repaglinide (PRANDIN) 1 MG tablet Take 1 tablet (1 mg total) by mouth 3 (three) times daily before meals. 90 tablet 11  . Skin Protectants, Misc. (EUCERIN) cream Apply 1 application topically 2 (two) times daily as needed for dry skin.     No facility-administered medications prior to visit.     ROS See HPI  Objective:  BP 126/70   Pulse 62   Temp 97.7 F (36.5 C)   Ht 5\' 6"  (1.676 m)   Wt 195 lb (88.5 kg)   SpO2 99%   BMI 31.47 kg/m   BP Readings from Last 3 Encounters:  11/01/16 126/70  10/19/16 120/64  10/12/16 103/65    Wt Readings from Last 3 Encounters:  11/01/16 195 lb (88.5 kg)  10/19/16 192 lb (87.1 kg)  10/12/16 188 lb (85.3 kg)    Physical Exam  Constitutional: He is oriented to person, place, and time. No distress.  Neck: Normal range of motion. Neck supple.  Cardiovascular: Normal rate and regular rhythm.   Pulmonary/Chest: Effort normal and breath sounds normal. No respiratory distress.  Abdominal: Soft. He exhibits no distension.  Musculoskeletal: He exhibits no edema.       Right shoulder: He exhibits decreased strength.  Left shoulder: He exhibits decreased strength.       Right hip: He exhibits decreased strength.       Left hip: He exhibits decreased range of motion.       Right hand: Decreased strength noted.       Left hand: Decreased strength noted.  Neurological: He is alert and oriented to person, place, and time.  Skin: Skin is warm and dry.  Psychiatric: He has a normal mood and affect. His behavior is normal.  Vitals reviewed.   Lab Results  Component Value Date   WBC 6.9 10/11/2016   HGB 13.6 10/11/2016   HCT 43.2 10/11/2016   PLT 219 10/11/2016   GLUCOSE 120 (H) 10/19/2016   CHOL 136 10/11/2016   TRIG 68 10/11/2016   HDL 47 10/11/2016   LDLCALC 75 10/11/2016   ALT 33 10/11/2016   AST 22  10/11/2016   NA 135 10/19/2016   K 4.3 10/19/2016   CL 105 10/19/2016   CREATININE 2.11 (H) 10/19/2016   BUN 20 10/19/2016   CO2 24 10/19/2016   TSH 0.80 08/13/2011   PSA 8.93 (H) 11/23/2014   INR 1.06 03/13/2016   HGBA1C 6.4 (H) 10/11/2016    Dg Chest 2 View  Result Date: 10/11/2016 CLINICAL DATA:  Cough.  Shortness of breath. EXAM: CHEST  2 VIEW COMPARISON:  05/15/2016. FINDINGS: Prior CABG. Cardiomegaly with normal pulmonary vascularity. No focal infiltrate. No pleural effusion or pneumothorax. No acute bony abnormality . Surgical clip right upper quadrant. IMPRESSION: No acute cardiopulmonary disease.  Prior CABG.  Heart size stable. Electronically Signed   By: Marcello Moores  Register   On: 10/11/2016 07:39    Assessment & Plan:   Caleb Dunn was seen today for follow-up.  Diagnoses and all orders for this visit:  Frequent falls -     For home use only DME 4 wheeled rolling walker with seat ([id number])  Type 2 diabetes mellitus with peripheral neuropathy ([place]) -     For home use only DME 4 wheeled rolling walker with seat ([id number])  Muscle weakness (generalized) -     For home use only DME 4 wheeled rolling walker with seat ([id number])  Need for assistance due to unsteady gait -     For home use only DME 4 wheeled rolling walker with seat ([id number])   I am having Caleb Dunn maintain his cholecalciferol, finasteride, lovastatin, eucerin, clopidogrel, hydrALAZINE, repaglinide, FLUoxetine, aspirin EC, isosorbide mononitrate, metoprolol succinate, ranitidine, and pantoprazole.  No orders of the defined types were placed in this encounter.   Follow-up: No Follow-up on file.  Caleb Lacy, NP

## 2016-11-01 NOTE — Patient Instructions (Signed)
FL2 form will be faxed to [facility].

## 2016-11-05 ENCOUNTER — Telehealth: Payer: Self-pay | Admitting: Nurse Practitioner

## 2016-11-05 NOTE — Telephone Encounter (Signed)
FL2 refaxed again, gave one copy to pt and send to scan.   Pt is aware.

## 2016-11-05 NOTE — Telephone Encounter (Signed)
Pt's son called checking on the pt's FL2 form that was to be sent over. Please advise.

## 2016-11-11 ENCOUNTER — Emergency Department (HOSPITAL_COMMUNITY)
Admission: EM | Admit: 2016-11-11 | Discharge: 2016-11-11 | Disposition: A | Discharge status: Home / Self Care | Payer: Medicare HMO | Attending: Emergency Medicine | Admitting: Emergency Medicine

## 2016-11-11 ENCOUNTER — Encounter (HOSPITAL_COMMUNITY): Payer: Self-pay | Admitting: Emergency Medicine

## 2016-11-11 ENCOUNTER — Emergency Department (HOSPITAL_COMMUNITY): Payer: Medicare HMO

## 2016-11-11 DIAGNOSIS — E113393 Type 2 diabetes mellitus with moderate nonproliferative diabetic retinopathy without macular edema, bilateral: Secondary | ICD-10-CM | POA: Diagnosis not present

## 2016-11-11 DIAGNOSIS — I2510 Atherosclerotic heart disease of native coronary artery without angina pectoris: Secondary | ICD-10-CM | POA: Diagnosis not present

## 2016-11-11 DIAGNOSIS — N184 Chronic kidney disease, stage 4 (severe): Secondary | ICD-10-CM | POA: Diagnosis not present

## 2016-11-11 DIAGNOSIS — Z7982 Long term (current) use of aspirin: Secondary | ICD-10-CM | POA: Insufficient documentation

## 2016-11-11 DIAGNOSIS — I509 Heart failure, unspecified: Secondary | ICD-10-CM | POA: Diagnosis not present

## 2016-11-11 DIAGNOSIS — Z7984 Long term (current) use of oral hypoglycemic drugs: Secondary | ICD-10-CM | POA: Insufficient documentation

## 2016-11-11 DIAGNOSIS — I130 Hypertensive heart and chronic kidney disease with heart failure and stage 1 through stage 4 chronic kidney disease, or unspecified chronic kidney disease: Secondary | ICD-10-CM | POA: Diagnosis not present

## 2016-11-11 DIAGNOSIS — R441 Visual hallucinations: Secondary | ICD-10-CM | POA: Insufficient documentation

## 2016-11-11 DIAGNOSIS — Z7902 Long term (current) use of antithrombotics/antiplatelets: Secondary | ICD-10-CM | POA: Diagnosis not present

## 2016-11-11 DIAGNOSIS — Z8673 Personal history of transient ischemic attack (TIA), and cerebral infarction without residual deficits: Secondary | ICD-10-CM | POA: Diagnosis not present

## 2016-11-11 DIAGNOSIS — H548 Legal blindness, as defined in USA: Secondary | ICD-10-CM | POA: Diagnosis not present

## 2016-11-11 DIAGNOSIS — R9431 Abnormal electrocardiogram [ECG] [EKG]: Secondary | ICD-10-CM | POA: Diagnosis not present

## 2016-11-11 DIAGNOSIS — Z87891 Personal history of nicotine dependence: Secondary | ICD-10-CM | POA: Diagnosis not present

## 2016-11-11 DIAGNOSIS — R4182 Altered mental status, unspecified: Secondary | ICD-10-CM | POA: Diagnosis not present

## 2016-11-11 LAB — DIFFERENTIAL
BASOS ABS: 0 10*3/uL (ref 0.0–0.1)
Basophils Relative: 0 %
EOS PCT: 1 %
Eosinophils Absolute: 0.1 10*3/uL (ref 0.0–0.7)
LYMPHS ABS: 2.4 10*3/uL (ref 0.7–4.0)
Lymphocytes Relative: 25 %
MONOS PCT: 9 %
Monocytes Absolute: 0.8 10*3/uL (ref 0.1–1.0)
Neutro Abs: 6.1 10*3/uL (ref 1.7–7.7)
Neutrophils Relative %: 65 %

## 2016-11-11 LAB — RAPID URINE DRUG SCREEN, HOSP PERFORMED
Amphetamines: NOT DETECTED
Barbiturates: NOT DETECTED
Benzodiazepines: NOT DETECTED
COCAINE: NOT DETECTED
Opiates: NOT DETECTED
Tetrahydrocannabinol: NOT DETECTED

## 2016-11-11 LAB — CBC
HEMATOCRIT: 40.4 % (ref 39.0–52.0)
HEMOGLOBIN: 13.4 g/dL (ref 13.0–17.0)
MCH: 27.2 pg (ref 26.0–34.0)
MCHC: 33.2 g/dL (ref 30.0–36.0)
MCV: 81.9 fL (ref 78.0–100.0)
Platelets: 206 10*3/uL (ref 150–400)
RBC: 4.93 MIL/uL (ref 4.22–5.81)
RDW: 14.1 % (ref 11.5–15.5)
WBC: 9.3 10*3/uL (ref 4.0–10.5)

## 2016-11-11 LAB — URINALYSIS, ROUTINE W REFLEX MICROSCOPIC
BACTERIA UA: NONE SEEN
Bilirubin Urine: NEGATIVE
GLUCOSE, UA: NEGATIVE mg/dL
Ketones, ur: 5 mg/dL — AB
LEUKOCYTES UA: NEGATIVE
Nitrite: NEGATIVE
Specific Gravity, Urine: 1.025 (ref 1.005–1.030)
pH: 5 (ref 5.0–8.0)

## 2016-11-11 LAB — COMPREHENSIVE METABOLIC PANEL
ALK PHOS: 81 U/L (ref 38–126)
ALT: 24 U/L (ref 17–63)
AST: 20 U/L (ref 15–41)
Albumin: 3.3 g/dL — ABNORMAL LOW (ref 3.5–5.0)
Anion gap: 5 (ref 5–15)
BILIRUBIN TOTAL: 0.7 mg/dL (ref 0.3–1.2)
BUN: 25 mg/dL — AB (ref 6–20)
CALCIUM: 9.1 mg/dL (ref 8.9–10.3)
CO2: 24 mmol/L (ref 22–32)
CREATININE: 2.38 mg/dL — AB (ref 0.61–1.24)
Chloride: 107 mmol/L (ref 101–111)
GFR calc Af Amer: 29 mL/min — ABNORMAL LOW (ref >60)
GFR, EST NON AFRICAN AMERICAN: 25 mL/min — AB (ref >60)
Glucose, Bld: 118 mg/dL — ABNORMAL HIGH (ref 65–99)
POTASSIUM: 4.4 mmol/L (ref 3.5–5.1)
Sodium: 136 mmol/L (ref 135–145)
TOTAL PROTEIN: 7.7 g/dL (ref 6.5–8.1)

## 2016-11-11 LAB — PROTIME-INR
INR: 1.07
Prothrombin Time: 13.8 seconds (ref 11.4–15.2)

## 2016-11-11 LAB — I-STAT TROPONIN, ED: TROPONIN I, POC: 0 ng/mL (ref 0.00–0.08)

## 2016-11-11 LAB — APTT: aPTT: 28 seconds (ref 24–36)

## 2016-11-11 LAB — ETHANOL: Alcohol, Ethyl (B): <10 mg/dL (ref <10)

## 2016-11-11 NOTE — Discharge Instructions (Signed)
Continue your current medications. Follow-up with your primary care doctor. Return as needed for worsening symptoms

## 2016-11-11 NOTE — ED Provider Notes (Signed)
[facility] EMERGENCY DEPARTMENT Provider Note   CSN: [id number] Arrival date & time: 11/11/16  5784     History   Chief Complaint Chief Complaint  Patient presents with  . Hallucinations    HPI Caleb Estelle Sr. is a 76 y.o. male.  HPI Pt started having trouble with hallucinations.  He lives in an assisted living facility.  He has been seeing people coming into his room sitting around him.  There were children in his room doing exercises.  His family arrived and they did not see the people.  No headaches.  No fevers.  No vomiting.  No dysuria.  He has had trouble with seeing things in the past but this was worse.   He has been getting weaker over all.  He requires assistance to walk.  He is legally blind.  He did see his PCP and they ordered a walker but he has not gotten it yet. Past Medical History:  Diagnosis Date  . Blind in both eyes   . CAD (coronary artery disease)   . Cholelithiasis   . CTS (carpal tunnel syndrome)    Left  . CVA (cerebral vascular accident) [facility]) ~ 02/2016   "dr says my left side is weaker now; I can'r tell" (10/11/2016)  . DM type 2 with diabetic peripheral neuropathy ([place])   . ED (erectile dysfunction)   . Elevated PSA   . GERD (gastroesophageal reflux disease)   . Glaucoma, both eyes   . High cholesterol   . HTN (hypertension)   . 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 MI (myocardial infarction) ([place]) 1991  . Thyroid nodule   . Type II diabetes mellitus [facility])     Patient Active Problem List   Diagnosis Date Noted  . Diabetes mellitus with complication ([place])   . Groin pain, right 09/04/2016  . Fall   . History of stroke   . AKI (acute kidney injury) ([place])   . Type 2 diabetes mellitus with peripheral neuropathy (HCC)   . Labile blood pressure   . Essential hypertension   . Blind   . 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 Acute cerebrovascular accident (CVA)  ([place]) 03/13/2016  . Pruritus 01/03/2016  . Impacted cerumen of left ear 10/03/2015  . Situational mixed anxiety and depressive disorder 06/17/2015  . Neuropathic pain 03/09/2015  . Insomnia 03/09/2015  . Left arm weakness 08/13/2014  . Herpes zoster 07/13/2014  . Constipation 07/13/2014  . Epididymitis 02/23/2014  . Pain in joint, ankle and foot 08/04/2013  . Dry mouth 05/05/2013  . 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 Obesity (BMI 30-39.9)   . Chronic kidney disease (CKD), stage IV (severe) ([place])   . Elevated PSA 08/17/2011  . Bladder neck obstruction 04/13/2011  . DM (diabetes mellitus), type 2 with ophthalmic complications ([place])   . Glaucoma associated with ocular disorder   . Carpal tunnel sundrome   . Erectile dysfunction   . GERD   . Cervical disc disorder with radiculopathy   . Hyperlipidemia   . Cholelithiases   . Hypertensive heart disease   . Coronary atherosclerosis     Past Surgical History:  Procedure Laterality Date  . CORONARY ANGIOPLASTY WITH STENT PLACEMENT  04/26/2000   Archie Endo 06/06/2010  . CORONARY ARTERY BYPASS GRAFT  2002   Archie Endo 06/06/2010  . EYE SURGERY Bilateral    "did laser on them"  . GLAUCOMA SURGERY  Bilateral    several/notes 06/06/2010  . LAPAROSCOPIC CHOLECYSTECTOMY  10/2004   Archie Endo 06/06/2010  . NECK MASS EXCISION Left    "benign"  . VITRECTOMY Right 05/01/2004   Archie Endo 06/06/2010       Home Medications    Prior to Admission medications   Medication Sig Start Date End Date Taking? Authorizing Provider  aspirin 81 MG tablet Take 1 tablet (81 mg total) by mouth daily. 10/12/16   Barton Dubois, MD  cholecalciferol (VITAMIN D) 1000 units tablet Take 1 tablet (1,000 Units total) by mouth daily. 03/29/16   Angiulli, Lavon Paganini, PA-C  clopidogrel (PLAVIX) 75 MG tablet Take 1 tablet (75 mg total) by mouth daily. 06/06/16   Plotnikov, Evie Lacks, MD  docusate sodium (COLACE) 100 MG capsule Take 1 capsule (100 mg total) by mouth 2 (two) times  daily. 11/01/16   Nche, Charlene Brooke, NP  finasteride (PROSCAR) 5 MG tablet Take 1 tablet (5 mg total) by mouth daily. For Prostate 03/29/16   AngiulliLavon Paganini, PA-C  FLUoxetine (PROZAC) 20 MG tablet Take 1 tablet (20 mg total) by mouth daily. 09/10/16   Plotnikov, Evie Lacks, MD  hydrALAZINE (APRESOLINE) 10 MG tablet Take 1 tablet (10 mg total) by mouth every 8 (eight) hours. 06/06/16   Plotnikov, Evie Lacks, MD  isosorbide mononitrate (IMDUR) 30 MG 24 hr tablet Take 1 tablet (30 mg total) by mouth daily. 10/13/16   Barton Dubois, MD  lovastatin (MEVACOR) 20 MG tablet Take 1 tablet (20 mg total) by mouth daily. For Cholesterol 03/29/16   Angiulli, Lavon Paganini, PA-C  metoprolol succinate (TOPROL-XL) 100 MG 24 hr tablet Take 1 tablet (100 mg total) by mouth daily. Take with or immediately following a meal. 10/13/16   Barton Dubois, MD  pantoprazole (PROTONIX) 40 MG tablet Take 1 tablet (40 mg total) by mouth daily. 10/19/16   Nche, Charlene Brooke, NP  ranitidine (ZANTAC) 150 MG tablet Take 1 tablet (150 mg total) by mouth at bedtime. 10/12/16 10/12/17  Barton Dubois, MD  repaglinide (PRANDIN) 1 MG tablet Take 1 tablet (1 mg total) by mouth 3 (three) times daily before meals. 06/13/16   Plotnikov, Evie Lacks, MD  Skin Protectants, Misc. (EUCERIN) cream Apply 1 application topically 2 (two) times daily as needed for dry skin.    [provider]    Family History Family History  Problem Relation Age of Onset  . Hypertension Mother   . Kidney disease Father   . Hypertension Father   . Coronary artery disease Unknown        1st degree Male relative <60  . Diabetes Unknown        1st degree relative    Social History Social History  Substance Use Topics  . Smoking status: Former Smoker    Packs/day: 2.00    Years: 35.00    Types: Cigarettes    Quit date: 78  . Smokeless tobacco: Never Used  . Alcohol use No     Allergies   Hydroxyzine; Verapamil; and Benazepril hcl   Review of  Systems Review of Systems  All other systems reviewed and are negative.    Physical Exam Updated Vital Signs BP (!) 145/88   Pulse 83   Temp 97.9 F (36.6 C)   Resp 16   Ht 1.676 m (5\' 6" )   Wt 90.7 kg (200 lb)   SpO2 97%   BMI 32.28 kg/m   Physical Exam  Constitutional: He appears well-developed and well-nourished. No distress.  HENT:  Head: Normocephalic and atraumatic.  Right Ear: External ear normal.  Left Ear: External ear normal.  Eyes: Conjunctivae are normal. Right eye exhibits no discharge. Left eye exhibits no discharge. No scleral icterus.  Neck: Neck supple. No tracheal deviation present.  Cardiovascular: Normal rate, regular rhythm and intact distal pulses.   Pulmonary/Chest: Effort normal and breath sounds normal. No stridor. No respiratory distress. He has no wheezes. He has no rales.  Abdominal: Soft. Bowel sounds are normal. He exhibits no distension. There is no tenderness. There is no rebound and no guarding.  Musculoskeletal: He exhibits no edema or tenderness.  Neurological: He is alert. He has normal strength. He is disoriented. No cranial nerve deficit (no facial droop, extraocular movements intact, no slurred speech, decrease vision ) or sensory deficit. He exhibits normal muscle tone. He displays no seizure activity. Coordination normal. GCS eye subscore is 4. GCS verbal subscore is 4. GCS motor subscore is 6.  Pleasant, smiling, disoriented to date  Skin: Skin is warm and dry. No rash noted.  Psychiatric: He has a normal mood and affect.  Nursing note and vitals reviewed.    ED Treatments / Results  Labs (all labs ordered are listed, but only abnormal results are displayed) Labs Reviewed  COMPREHENSIVE METABOLIC PANEL - Abnormal; Notable for the following:       Result Value   Glucose, Bld 118 (*)    BUN 25 (*)    Creatinine, Ser 2.38 (*)    Albumin 3.3 (*)    GFR calc non Af Amer 25 (*)    GFR calc Af Amer 29 (*)    All other components  within normal limits  URINALYSIS, ROUTINE W REFLEX MICROSCOPIC - Abnormal; Notable for the following:    Hgb urine dipstick SMALL (*)    Ketones, ur 5 (*)    Protein, ur >=300 (*)    Squamous Epithelial / LPF 0-5 (*)    All other components within normal limits  ETHANOL  PROTIME-INR  APTT  CBC  DIFFERENTIAL  RAPID URINE DRUG SCREEN, HOSP PERFORMED  I-STAT TROPONIN, ED    EKG  EKG Interpretation  Date/Time:  Sunday November 11 2016 08:27:21 EDT Ventricular Rate:  61 PR Interval:    QRS Duration: 107 QT Interval:  427 QTC Calculation: 431 R Axis:   -15 Text Interpretation:  Sinus rhythm Borderline left axis deviation Abnormal T, consider ischemia, lateral leads , improved compared to prior tracing Since last tracing rate slower Confirmed by Dorie Rank [phone]) on 11/11/2016 8:31:44 AM       Radiology Ct Head Wo Contrast  Result Date: 11/11/2016 CLINICAL DATA:  Altered mental status. Legally blind. History of diabetes, hypertension, and hypercholesterolemia. EXAM: CT HEAD WITHOUT CONTRAST TECHNIQUE: Contiguous axial images were obtained from the base of the skull through the vertex without intravenous contrast. COMPARISON:  CT and MR of the head 05/15/2016. FINDINGS: Brain: No evidence for acute infarction, hemorrhage, mass lesion, or extra-axial fluid. Generalized atrophy. Hydrocephalus ex vacuo. Hypoattenuation of white matter, consistent with small vessel disease. Chronic cerebellar and LEFT thalamus infarcts. Vascular: Calcification of the cavernous internal carotid arteries consistent with cerebrovascular atherosclerotic disease. No signs of intracranial large vessel occlusion. Skull: Normal. Negative for fracture or focal lesion. Sinuses/Orbits: No acute finding. Ocular banding RIGHT globe. Cataract extraction LEFT globe. Other: None. IMPRESSION: Atrophy and small vessel disease.  Evidence for chronic ischemia. No acute intracranial findings are evident. Electronically Signed    By: Roderic Ovens.D.  On: 11/11/2016 09:38    Procedures Procedures (including critical care time)  Medications Ordered in ED Medications - No data to display   Initial Impression / Assessment and Plan / ED Course  I have reviewed the triage vital signs and the nursing notes.  Pertinent labs & imaging results that were available during my care of the patient were reviewed by me and considered in my medical decision making (see chart for details).   patient presented to the emergency room for evaluation of visual hallucinations. Patient was seeing people inside his room. However, he does recognize that no one else is seeing these things. . Patient does not appear to have any signs of acute infection or psychosis.  Possible the symptoms may be related to dementia.  Patient was monitored in the emergency room for several hours. He remained otherwise stable. Laboratory tests and x-rays are reassuring (renal insuf is chronic). Patient and family are comfortable going home with outpatient follow-up.  Final Clinical Impressions(s) / ED Diagnoses   Final diagnoses:  Visual hallucinations    New Prescriptions New Prescriptions   No medications on file     Dorie Rank, MD 11/11/16 1443

## 2016-11-11 NOTE — ED Triage Notes (Addendum)
Per son patient has been altered worse than usual since yesterday.  Having hallucinations.  Patient is blind but reports seeing things.  History of the same.  Denies having any pain or urinary issues.  Son reports that patient will be moving into a nursing home in another week but wouldn't mind if he went from here.

## 2016-11-12 ENCOUNTER — Telehealth: Payer: Self-pay | Admitting: Internal Medicine

## 2016-11-12 NOTE — Telephone Encounter (Incomplete)
Patients Daughter called today to request patients medication list to be faxed to [facility] center to the attn: Crecencio Mc. I was informed I cannot do that without a ROI being signed by the patient. I faxed a ROI for the patient to [employer]... [phone]

## 2016-11-15 DIAGNOSIS — E1140 Type 2 diabetes mellitus with diabetic neuropathy, unspecified: Secondary | ICD-10-CM | POA: Diagnosis not present

## 2016-11-15 DIAGNOSIS — I6980 Unspecified sequelae of other cerebrovascular disease: Secondary | ICD-10-CM | POA: Diagnosis not present

## 2016-11-15 DIAGNOSIS — M6281 Muscle weakness (generalized): Secondary | ICD-10-CM | POA: Diagnosis not present

## 2016-11-16 DIAGNOSIS — M6281 Muscle weakness (generalized): Secondary | ICD-10-CM | POA: Diagnosis not present

## 2016-11-16 DIAGNOSIS — E1140 Type 2 diabetes mellitus with diabetic neuropathy, unspecified: Secondary | ICD-10-CM | POA: Diagnosis not present

## 2016-11-16 DIAGNOSIS — I6980 Unspecified sequelae of other cerebrovascular disease: Secondary | ICD-10-CM | POA: Diagnosis not present

## 2016-11-19 DIAGNOSIS — I6980 Unspecified sequelae of other cerebrovascular disease: Secondary | ICD-10-CM | POA: Diagnosis not present

## 2016-11-19 DIAGNOSIS — E1140 Type 2 diabetes mellitus with diabetic neuropathy, unspecified: Secondary | ICD-10-CM | POA: Diagnosis not present

## 2016-11-19 DIAGNOSIS — M6281 Muscle weakness (generalized): Secondary | ICD-10-CM | POA: Diagnosis not present

## 2016-11-20 ENCOUNTER — Ambulatory Visit: Payer: Medicare HMO | Admitting: Internal Medicine

## 2016-11-20 DIAGNOSIS — I6980 Unspecified sequelae of other cerebrovascular disease: Secondary | ICD-10-CM | POA: Diagnosis not present

## 2016-11-20 DIAGNOSIS — M6281 Muscle weakness (generalized): Secondary | ICD-10-CM | POA: Diagnosis not present

## 2016-11-20 DIAGNOSIS — E1140 Type 2 diabetes mellitus with diabetic neuropathy, unspecified: Secondary | ICD-10-CM | POA: Diagnosis not present

## 2016-11-21 ENCOUNTER — Other Ambulatory Visit: Payer: Self-pay | Admitting: *Deleted

## 2016-11-21 DIAGNOSIS — E1140 Type 2 diabetes mellitus with diabetic neuropathy, unspecified: Secondary | ICD-10-CM | POA: Diagnosis not present

## 2016-11-21 DIAGNOSIS — I6980 Unspecified sequelae of other cerebrovascular disease: Secondary | ICD-10-CM | POA: Diagnosis not present

## 2016-11-21 DIAGNOSIS — M6281 Muscle weakness (generalized): Secondary | ICD-10-CM | POA: Diagnosis not present

## 2016-11-21 NOTE — Patient Outreach (Signed)
  [facility]) Care Management Post-Acute Care Coordination  11/21/2016  Caleb Thelin Sr. 14-Jun-1940 [id number]   Communication with Vernice Jefferson for [employer]. Reviewed patient case. She confirms that patient is a [place] resident of facility and there are no plans to discharge at this time.  RNCM will sign off case.  Royetta Crochet. Laymond Purser, RN, BSN, [place] Post-Acute Care Coordinator [phone]

## 2016-11-22 DIAGNOSIS — N429 Disorder of prostate, unspecified: Secondary | ICD-10-CM | POA: Diagnosis not present

## 2016-11-22 DIAGNOSIS — F329 Major depressive disorder, single episode, unspecified: Secondary | ICD-10-CM | POA: Diagnosis not present

## 2016-11-22 DIAGNOSIS — E1140 Type 2 diabetes mellitus with diabetic neuropathy, unspecified: Secondary | ICD-10-CM | POA: Diagnosis not present

## 2016-11-22 DIAGNOSIS — E119 Type 2 diabetes mellitus without complications: Secondary | ICD-10-CM | POA: Diagnosis not present

## 2016-11-22 DIAGNOSIS — M6281 Muscle weakness (generalized): Secondary | ICD-10-CM | POA: Diagnosis not present

## 2016-11-22 DIAGNOSIS — I6980 Unspecified sequelae of other cerebrovascular disease: Secondary | ICD-10-CM | POA: Diagnosis not present

## 2016-11-23 DIAGNOSIS — F329 Major depressive disorder, single episode, unspecified: Secondary | ICD-10-CM | POA: Diagnosis not present

## 2016-11-23 DIAGNOSIS — E119 Type 2 diabetes mellitus without complications: Secondary | ICD-10-CM | POA: Diagnosis not present

## 2016-11-23 DIAGNOSIS — N429 Disorder of prostate, unspecified: Secondary | ICD-10-CM | POA: Diagnosis not present

## 2016-11-23 DIAGNOSIS — M6281 Muscle weakness (generalized): Secondary | ICD-10-CM | POA: Diagnosis not present

## 2016-11-24 DIAGNOSIS — R102 Pelvic and perineal pain: Secondary | ICD-10-CM | POA: Diagnosis not present

## 2016-11-25 DIAGNOSIS — I6980 Unspecified sequelae of other cerebrovascular disease: Secondary | ICD-10-CM | POA: Diagnosis not present

## 2016-11-25 DIAGNOSIS — M6281 Muscle weakness (generalized): Secondary | ICD-10-CM | POA: Diagnosis not present

## 2016-11-25 DIAGNOSIS — E1140 Type 2 diabetes mellitus with diabetic neuropathy, unspecified: Secondary | ICD-10-CM | POA: Diagnosis not present

## 2016-11-26 DIAGNOSIS — E1140 Type 2 diabetes mellitus with diabetic neuropathy, unspecified: Secondary | ICD-10-CM | POA: Diagnosis not present

## 2016-11-26 DIAGNOSIS — I6980 Unspecified sequelae of other cerebrovascular disease: Secondary | ICD-10-CM | POA: Diagnosis not present

## 2016-11-26 DIAGNOSIS — M6281 Muscle weakness (generalized): Secondary | ICD-10-CM | POA: Diagnosis not present

## 2016-11-27 DIAGNOSIS — I6980 Unspecified sequelae of other cerebrovascular disease: Secondary | ICD-10-CM | POA: Diagnosis not present

## 2016-11-27 DIAGNOSIS — M6281 Muscle weakness (generalized): Secondary | ICD-10-CM | POA: Diagnosis not present

## 2016-11-27 DIAGNOSIS — N400 Enlarged prostate without lower urinary tract symptoms: Secondary | ICD-10-CM | POA: Diagnosis not present

## 2016-11-27 DIAGNOSIS — E119 Type 2 diabetes mellitus without complications: Secondary | ICD-10-CM | POA: Diagnosis not present

## 2016-11-27 DIAGNOSIS — E1140 Type 2 diabetes mellitus with diabetic neuropathy, unspecified: Secondary | ICD-10-CM | POA: Diagnosis not present

## 2016-11-28 DIAGNOSIS — I6980 Unspecified sequelae of other cerebrovascular disease: Secondary | ICD-10-CM | POA: Diagnosis not present

## 2016-11-28 DIAGNOSIS — E1140 Type 2 diabetes mellitus with diabetic neuropathy, unspecified: Secondary | ICD-10-CM | POA: Diagnosis not present

## 2016-11-28 DIAGNOSIS — M6281 Muscle weakness (generalized): Secondary | ICD-10-CM | POA: Diagnosis not present

## 2016-11-30 ENCOUNTER — Ambulatory Visit: Payer: Medicare HMO | Admitting: Internal Medicine

## 2016-11-30 ENCOUNTER — Encounter: Payer: Self-pay | Admitting: Internal Medicine

## 2016-11-30 DIAGNOSIS — I119 Hypertensive heart disease without heart failure: Secondary | ICD-10-CM | POA: Diagnosis not present

## 2016-11-30 DIAGNOSIS — N5082 Scrotal pain: Secondary | ICD-10-CM | POA: Insufficient documentation

## 2016-11-30 DIAGNOSIS — E11311 Type 2 diabetes mellitus with unspecified diabetic retinopathy with macular edema: Secondary | ICD-10-CM

## 2016-11-30 MED ORDER — GABAPENTIN 100 MG PO CAPS: 100.0000 mg | ORAL_CAPSULE | Freq: Three times a day (TID) | ORAL | 5 refills | Status: AC

## 2016-11-30 NOTE — Progress Notes (Signed)
Subjective:  Patient ID: Georgeanne Nim Sr., male    DOB: Aug 16, 1940  Age: 76 y.o. MRN: [id number]  CC: No chief complaint on file.   HPI [name] presents for R LBP, groin and scrotum pain - moderate, worse w/moving x weeks He had it before. Most pain is in the scrotum... F/u HTN, DM  Outpatient Medications Prior to Visit  Medication Sig Dispense Refill  . aspirin 81 MG tablet Take 1 tablet (81 mg total) by mouth daily. 30 tablet 3  . cholecalciferol (VITAMIN D) 1000 units tablet Take 1 tablet (1,000 Units total) by mouth daily. 100 tablet 3  . clopidogrel (PLAVIX) 75 MG tablet Take 1 tablet (75 mg total) by mouth daily. 90 tablet 1  . docusate sodium (COLACE) 100 MG capsule Take 1 capsule (100 mg total) by mouth 2 (two) times daily. 10 capsule 0  . finasteride (PROSCAR) 5 MG tablet Take 1 tablet (5 mg total) by mouth daily. For Prostate 90 tablet 3  . FLUoxetine (PROZAC) 20 MG tablet Take 1 tablet (20 mg total) by mouth daily. 30 tablet 11  . hydrALAZINE (APRESOLINE) 10 MG tablet Take 1 tablet (10 mg total) by mouth every 8 (eight) hours. 90 tablet 1  . isosorbide mononitrate (IMDUR) 30 MG 24 hr tablet Take 1 tablet (30 mg total) by mouth daily. 30 tablet 3  . lovastatin (MEVACOR) 20 MG tablet Take 1 tablet (20 mg total) by mouth daily. For Cholesterol 90 tablet 3  . metoprolol succinate (TOPROL-XL) 100 MG 24 hr tablet Take 1 tablet (100 mg total) by mouth daily. Take with or immediately following a meal. 30 tablet 3  . pantoprazole (PROTONIX) 40 MG tablet Take 1 tablet (40 mg total) by mouth daily. 90 tablet 1  . ranitidine (ZANTAC) 150 MG tablet Take 1 tablet (150 mg total) by mouth at bedtime. 30 tablet 1  . repaglinide (PRANDIN) 1 MG tablet Take 1 tablet (1 mg total) by mouth 3 (three) times daily before meals. 90 tablet 11  . Skin Protectants, Misc. (EUCERIN) cream Apply 1 application topically 2 (two) times daily as needed for dry skin.     No facility-administered  medications prior to visit.     ROS Review of Systems  Constitutional: Negative for appetite change, fatigue and unexpected weight change.  HENT: Negative for congestion, nosebleeds, sneezing, sore throat and trouble swallowing.   Eyes: Positive for visual disturbance. Negative for itching.  Respiratory: Negative for cough.   Cardiovascular: Negative for chest pain, palpitations and leg swelling.  Gastrointestinal: Negative for abdominal distention, blood in stool, diarrhea and nausea.  Genitourinary: Positive for dysuria and urgency. Negative for frequency and hematuria.  Musculoskeletal: Positive for back pain and gait problem. Negative for joint swelling and neck pain.  Skin: Negative for rash.  Neurological: Negative for dizziness, tremors, speech difficulty and weakness.  Psychiatric/Behavioral: Negative for agitation, dysphoric mood and sleep disturbance. The patient is not nervous/anxious.     Objective:  BP 126/74 (BP Location: Left Arm, Patient Position: Sitting, Cuff Size: Large)   Pulse 65   Temp 97.7 F (36.5 C) (Oral)   Ht 5\' 6"  (1.676 m)   Wt 191 lb (86.6 kg)   SpO2 99%   BMI 30.83 kg/m   BP Readings from Last 3 Encounters:  11/30/16 126/74  11/11/16 (!) 145/88  11/01/16 126/70    Wt Readings from Last 3 Encounters:  11/30/16 191 lb (86.6 kg)  11/11/16 200 lb (90.7 kg)  11/01/16  195 lb (88.5 kg)    Physical Exam  Constitutional: He is oriented to person, place, and time. He appears well-developed. No distress.  NAD  HENT:  Mouth/Throat: Oropharynx is clear and moist.  Eyes: Conjunctivae are normal. Pupils are equal, round, and reactive to light.  Neck: Normal range of motion. No JVD present. No thyromegaly present.  Cardiovascular: Normal rate, regular rhythm, normal heart sounds and intact distal pulses. Exam reveals no gallop and no friction rub.  No murmur heard. Pulmonary/Chest: Effort normal and breath sounds normal. No respiratory distress. He has  no wheezes. He has no rales. He exhibits no tenderness.  Abdominal: Soft. Bowel sounds are normal. He exhibits no distension and no mass. There is no tenderness. There is no rebound and no guarding.  Musculoskeletal: Normal range of motion. He exhibits tenderness. He exhibits no edema.  Lymphadenopathy:    He has no cervical adenopathy.  Neurological: He is alert and oriented to person, place, and time. He has normal reflexes. No cranial nerve deficit. He exhibits normal muscle tone. He displays a negative Romberg sign. Coordination abnormal. Gait normal.  Skin: Skin is warm and dry. No rash noted. No pallor.  Psychiatric: He has a normal mood and affect. His behavior is normal. Judgment and thought content normal.   no hernia R testis is sensitive/painfull No rash Wearing Depense, incontinent Cane LS tender w/ROM  Lab Results  Component Value Date   WBC 9.3 11/11/2016   HGB 13.4 11/11/2016   HCT 40.4 11/11/2016   PLT 206 11/11/2016   GLUCOSE 118 (H) 11/11/2016   CHOL 136 10/11/2016   TRIG 68 10/11/2016   HDL 47 10/11/2016   LDLCALC 75 10/11/2016   ALT 24 11/11/2016   AST 20 11/11/2016   NA 136 11/11/2016   K 4.4 11/11/2016   CL 107 11/11/2016   CREATININE 2.38 (H) 11/11/2016   BUN 25 (H) 11/11/2016   CO2 24 11/11/2016   TSH 0.80 08/13/2011   PSA 8.93 (H) 11/23/2014   INR 1.07 11/11/2016   HGBA1C 6.4 (H) 10/11/2016    Ct Head Wo Contrast  Result Date: 11/11/2016 CLINICAL DATA:  Altered mental status. Legally blind. History of diabetes, hypertension, and hypercholesterolemia. EXAM: CT HEAD WITHOUT CONTRAST TECHNIQUE: Contiguous axial images were obtained from the base of the skull through the vertex without intravenous contrast. COMPARISON:  CT and MR of the head 05/15/2016. FINDINGS: Brain: No evidence for acute infarction, hemorrhage, mass lesion, or extra-axial fluid. Generalized atrophy. Hydrocephalus ex vacuo. Hypoattenuation of white matter, consistent with small  vessel disease. Chronic cerebellar and LEFT thalamus infarcts. Vascular: Calcification of the cavernous internal carotid arteries consistent with cerebrovascular atherosclerotic disease. No signs of intracranial large vessel occlusion. Skull: Normal. Negative for fracture or focal lesion. Sinuses/Orbits: No acute finding. Ocular banding RIGHT globe. Cataract extraction LEFT globe. Other: None. IMPRESSION: Atrophy and small vessel disease.  Evidence for chronic ischemia. No acute intracranial findings are evident. Electronically Signed   By: Staci Righter M.D.   On: 11/11/2016 09:38    Assessment & Plan:   There are no diagnoses linked to this encounter. I am having Thoren Benedict Sr. maintain his cholecalciferol, finasteride, lovastatin, eucerin, clopidogrel, hydrALAZINE, repaglinide, FLUoxetine, aspirin EC, isosorbide mononitrate, metoprolol succinate, ranitidine, pantoprazole, and docusate sodium.  No orders of the defined types were placed in this encounter.    Follow-up: No Follow-up on file.  Walker Kehr, MD

## 2016-11-30 NOTE — Assessment & Plan Note (Signed)
R ?etiology - ?sciattica vs other Gabapentin po US scrotal

## 2016-12-02 NOTE — Assessment & Plan Note (Signed)
Prandin 

## 2016-12-28 ENCOUNTER — Ambulatory Visit: Payer: Medicare HMO | Admitting: Internal Medicine

## 2017-01-02 ENCOUNTER — Encounter: Payer: Self-pay | Admitting: Internal Medicine

## 2017-01-17 ENCOUNTER — Encounter: Payer: Self-pay | Admitting: Internal Medicine

## 2017-02-01 ENCOUNTER — Telehealth: Payer: Self-pay | Admitting: Internal Medicine

## 2017-02-01 NOTE — Telephone Encounter (Unsigned)
Copied from [place] [phone]. Topic: Quick Communication - See Telephone Encounter >> Feb 01, 2017  8:56 AM Hewitt Shorts wrote: CRM for notification. See Telephone encounter for: pt is needing to speak with dr Caleb Dunn about a letter and him being in a nursing home and needing someone 24 hrs which he feels he does not need to have done  Best number [phone]  02/01/17.

## 2017-02-01 NOTE — Telephone Encounter (Signed)
Copied from [place] [phone]. Topic: Inquiry >> Feb 01, 2017  8:44 AM Corie Chiquito, [place] wrote: Reason for CRM: Patient daughter called because she would like to speak with Dr.Plotnikov or his nurse about her father. Stated that he is trying to leave the nursing home but he doesn't need to be alone. She would like to know if his doctor could put something in witting if he can or can't be alone.If they can give her a call back at [phone]. If no answer please leave vmail

## 2017-02-05 NOTE — Telephone Encounter (Signed)
Please advise 

## 2017-02-05 NOTE — Telephone Encounter (Signed)
Caleb Dunn is unable to leave alone.  Thank you!

## 2017-02-06 NOTE — Telephone Encounter (Signed)
LMTCB

## 2017-02-08 NOTE — Telephone Encounter (Signed)
Pt's daughter calling back for Caleb Dunn

## 2017-02-08 NOTE — Telephone Encounter (Signed)
[name] do you know what this is about?

## 2017-02-11 NOTE — Telephone Encounter (Signed)
Letter written and mailed to daughter, pt verbally informed

## 2017-03-11 ENCOUNTER — Ambulatory Visit: Payer: Medicare HMO | Admitting: Internal Medicine

## 2017-03-11 DIAGNOSIS — Z0289 Encounter for other administrative examinations: Secondary | ICD-10-CM

## 2017-03-18 DIAGNOSIS — R52 Pain, unspecified: Secondary | ICD-10-CM | POA: Diagnosis not present

## 2017-03-18 DIAGNOSIS — M6281 Muscle weakness (generalized): Secondary | ICD-10-CM | POA: Diagnosis not present

## 2017-03-18 DIAGNOSIS — E1139 Type 2 diabetes mellitus with other diabetic ophthalmic complication: Secondary | ICD-10-CM | POA: Diagnosis not present

## 2017-03-18 DIAGNOSIS — E1140 Type 2 diabetes mellitus with diabetic neuropathy, unspecified: Secondary | ICD-10-CM | POA: Diagnosis not present

## 2017-03-18 DIAGNOSIS — I10 Essential (primary) hypertension: Secondary | ICD-10-CM | POA: Diagnosis not present

## 2017-03-18 DIAGNOSIS — E119 Type 2 diabetes mellitus without complications: Secondary | ICD-10-CM | POA: Diagnosis not present

## 2017-04-08 DIAGNOSIS — Z79899 Other long term (current) drug therapy: Secondary | ICD-10-CM | POA: Diagnosis not present

## 2017-04-25 DIAGNOSIS — N184 Chronic kidney disease, stage 4 (severe): Secondary | ICD-10-CM | POA: Diagnosis not present

## 2017-04-25 DIAGNOSIS — I119 Hypertensive heart disease without heart failure: Secondary | ICD-10-CM | POA: Diagnosis not present

## 2017-04-25 DIAGNOSIS — I2510 Atherosclerotic heart disease of native coronary artery without angina pectoris: Secondary | ICD-10-CM | POA: Diagnosis not present

## 2017-04-25 DIAGNOSIS — I6980 Unspecified sequelae of other cerebrovascular disease: Secondary | ICD-10-CM | POA: Diagnosis not present

## 2017-04-25 DIAGNOSIS — E1140 Type 2 diabetes mellitus with diabetic neuropathy, unspecified: Secondary | ICD-10-CM | POA: Diagnosis not present

## 2017-05-01 ENCOUNTER — Telehealth: Payer: Self-pay | Admitting: Internal Medicine

## 2017-05-01 DIAGNOSIS — H548 Legal blindness, as defined in USA: Secondary | ICD-10-CM | POA: Diagnosis not present

## 2017-05-01 DIAGNOSIS — I1310 Hypertensive heart and chronic kidney disease without heart failure, with stage 1 through stage 4 chronic kidney disease, or unspecified chronic kidney disease: Secondary | ICD-10-CM | POA: Diagnosis not present

## 2017-05-01 DIAGNOSIS — N183 Chronic kidney disease, stage 3 (moderate): Secondary | ICD-10-CM | POA: Diagnosis not present

## 2017-05-01 DIAGNOSIS — H42 Glaucoma in diseases classified elsewhere: Secondary | ICD-10-CM | POA: Diagnosis not present

## 2017-05-01 DIAGNOSIS — M6281 Muscle weakness (generalized): Secondary | ICD-10-CM | POA: Diagnosis not present

## 2017-05-01 DIAGNOSIS — I252 Old myocardial infarction: Secondary | ICD-10-CM | POA: Diagnosis not present

## 2017-05-01 DIAGNOSIS — E1122 Type 2 diabetes mellitus with diabetic chronic kidney disease: Secondary | ICD-10-CM | POA: Diagnosis not present

## 2017-05-01 DIAGNOSIS — E1139 Type 2 diabetes mellitus with other diabetic ophthalmic complication: Secondary | ICD-10-CM | POA: Diagnosis not present

## 2017-05-01 DIAGNOSIS — I69398 Other sequelae of cerebral infarction: Secondary | ICD-10-CM | POA: Diagnosis not present

## 2017-05-01 MED ORDER — BLOOD GLUCOSE TEST VI STRP: ORAL_STRIP | 3 refills | Status: DC

## 2017-05-01 MED ORDER — LANCETS MISC: 3 refills | Status: DC

## 2017-05-01 MED ORDER — BLOOD GLUCOSE MONITOR KIT: PACK | 0 refills | Status: DC

## 2017-05-01 NOTE — Telephone Encounter (Signed)
p pec Copied from [place] [phone]. Topic: Quick Communication - See Telephone Encounter >> May 01, 2017 10:01 AM Bea Graff, [place] wrote: CRM for notification. See Telephone encounter for: 05/01/17. Sherah with [facility] is needing verbal orders for skilled nursing, physical therapy and a Education officer, museum. Pt already has a home health aide coming out and does not need services for that through [place]. Also, she would like to provide the vitals for this pt: BP: 168/88 Temp: 97.5 Pulse: 58 O2 stat on room air: 99% Resptiations: 16. Also, pt is a diabetic and she would like to see if a glucose meter, testing strips and lancets can be ordered for this pt. Patient is on diabetic medication and they don't  have a way to test his sugars. [facility] ([address]), [place] [phone] (Phone) [phone] (Fax)

## 2017-05-02 NOTE — Telephone Encounter (Signed)
FYI, verbals given and diabetic supplies sent

## 2017-05-07 ENCOUNTER — Telehealth: Payer: Self-pay | Admitting: Internal Medicine

## 2017-05-07 DIAGNOSIS — I1310 Hypertensive heart and chronic kidney disease without heart failure, with stage 1 through stage 4 chronic kidney disease, or unspecified chronic kidney disease: Secondary | ICD-10-CM | POA: Diagnosis not present

## 2017-05-07 DIAGNOSIS — N183 Chronic kidney disease, stage 3 (moderate): Secondary | ICD-10-CM | POA: Diagnosis not present

## 2017-05-07 DIAGNOSIS — I69398 Other sequelae of cerebral infarction: Secondary | ICD-10-CM | POA: Diagnosis not present

## 2017-05-07 DIAGNOSIS — M6281 Muscle weakness (generalized): Secondary | ICD-10-CM | POA: Diagnosis not present

## 2017-05-07 DIAGNOSIS — E1122 Type 2 diabetes mellitus with diabetic chronic kidney disease: Secondary | ICD-10-CM | POA: Diagnosis not present

## 2017-05-07 DIAGNOSIS — H548 Legal blindness, as defined in USA: Secondary | ICD-10-CM | POA: Diagnosis not present

## 2017-05-07 DIAGNOSIS — E1139 Type 2 diabetes mellitus with other diabetic ophthalmic complication: Secondary | ICD-10-CM | POA: Diagnosis not present

## 2017-05-07 DIAGNOSIS — I252 Old myocardial infarction: Secondary | ICD-10-CM | POA: Diagnosis not present

## 2017-05-07 DIAGNOSIS — H42 Glaucoma in diseases classified elsewhere: Secondary | ICD-10-CM | POA: Diagnosis not present

## 2017-05-07 NOTE — Telephone Encounter (Signed)
Copied from [place] [phone]. Topic: Inquiry >> May 07, 2017  4:45 PM Oliver Pila B wrote: Reason for CRM: [facility] called for verbal orders, contact [phone]

## 2017-05-08 ENCOUNTER — Telehealth: Payer: Self-pay | Admitting: Internal Medicine

## 2017-05-08 MED ORDER — ACCU-CHEK SOFTCLIX LANCETS MISC: 12 refills | Status: AC

## 2017-05-08 MED ORDER — GLUCOSE BLOOD VI STRP: ORAL_STRIP | 12 refills | Status: AC

## 2017-05-08 MED ORDER — ACCU-CHEK NANO SMARTVIEW W/DEVICE KIT: PACK | 0 refills | Status: DC

## 2017-05-08 NOTE — Telephone Encounter (Signed)
Copied from [place] [phone]. Topic: Quick Communication - See Telephone Encounter >> May 01, 2017 10:01 AM Bea Graff, NT wrote: CRM for notification. See Telephone encounter for: 05/01/17. Sherah with [facility] is needing verbal orders for skilled nursing, physical therapy and a Education officer, museum. Pt already has a home health aide coming out and does not need services for that through [place]. Also, she would like to provide the vitals for this pt: BP: 168/88 Temp: 97.5 Pulse: 58 O2 stat on room air: 99% Resptiations: 16. Also, pt is a diabetic and she would like to see if a glucose meter, testing strips and lancets can be ordered for this pt. Patient is on diabetic medication and they don't  have a way to test his sugars. [facility] ([address] [place] - [place] [phone] (Phone) [phone] (Fax)     >> May 08, 2017  4:24 PM Vernona Rieger wrote: Laurence Slate called back and said that the son went to [facility] & they do not have any supplies for him. Could that please re-sent to [place] [address].

## 2017-05-08 NOTE — Telephone Encounter (Signed)
Rxs sent

## 2017-05-08 NOTE — Addendum Note (Signed)
Addended by: Karren Cobble on: 05/08/2017 04:42 PM   Modules accepted: Orders

## 2017-05-09 NOTE — Telephone Encounter (Signed)
OK. Thx

## 2017-05-09 NOTE — Telephone Encounter (Signed)
verbals given for PT

## 2017-05-10 DIAGNOSIS — I252 Old myocardial infarction: Secondary | ICD-10-CM | POA: Diagnosis not present

## 2017-05-10 DIAGNOSIS — M6281 Muscle weakness (generalized): Secondary | ICD-10-CM | POA: Diagnosis not present

## 2017-05-10 DIAGNOSIS — I69398 Other sequelae of cerebral infarction: Secondary | ICD-10-CM | POA: Diagnosis not present

## 2017-05-10 DIAGNOSIS — N183 Chronic kidney disease, stage 3 (moderate): Secondary | ICD-10-CM | POA: Diagnosis not present

## 2017-05-10 DIAGNOSIS — I1310 Hypertensive heart and chronic kidney disease without heart failure, with stage 1 through stage 4 chronic kidney disease, or unspecified chronic kidney disease: Secondary | ICD-10-CM | POA: Diagnosis not present

## 2017-05-10 DIAGNOSIS — H42 Glaucoma in diseases classified elsewhere: Secondary | ICD-10-CM | POA: Diagnosis not present

## 2017-05-10 DIAGNOSIS — H548 Legal blindness, as defined in USA: Secondary | ICD-10-CM | POA: Diagnosis not present

## 2017-05-10 DIAGNOSIS — E1122 Type 2 diabetes mellitus with diabetic chronic kidney disease: Secondary | ICD-10-CM | POA: Diagnosis not present

## 2017-05-10 DIAGNOSIS — E1139 Type 2 diabetes mellitus with other diabetic ophthalmic complication: Secondary | ICD-10-CM | POA: Diagnosis not present

## 2017-05-13 ENCOUNTER — Encounter: Payer: Self-pay | Admitting: Internal Medicine

## 2017-05-13 ENCOUNTER — Other Ambulatory Visit (INDEPENDENT_AMBULATORY_CARE_PROVIDER_SITE_OTHER): Payer: Medicare HMO

## 2017-05-13 ENCOUNTER — Ambulatory Visit (INDEPENDENT_AMBULATORY_CARE_PROVIDER_SITE_OTHER): Payer: Medicare HMO | Admitting: Internal Medicine

## 2017-05-13 VITALS — BP 122/64 | HR 57 | Temp 98.2°F | Ht 66.0 in | Wt 190.0 lb

## 2017-05-13 DIAGNOSIS — I6389 Other cerebral infarction: Secondary | ICD-10-CM

## 2017-05-13 DIAGNOSIS — Z23 Encounter for immunization: Secondary | ICD-10-CM

## 2017-05-13 DIAGNOSIS — E1142 Type 2 diabetes mellitus with diabetic polyneuropathy: Secondary | ICD-10-CM

## 2017-05-13 DIAGNOSIS — W19XXXS Unspecified fall, sequela: Secondary | ICD-10-CM

## 2017-05-13 DIAGNOSIS — I119 Hypertensive heart disease without heart failure: Secondary | ICD-10-CM | POA: Diagnosis not present

## 2017-05-13 LAB — BASIC METABOLIC PANEL
BUN: 30 mg/dL — ABNORMAL HIGH (ref 6–23)
CHLORIDE: 106 meq/L (ref 96–112)
CO2: 22 mEq/L (ref 19–32)
Calcium: 8.7 mg/dL (ref 8.4–10.5)
Creatinine, Ser: 2.28 mg/dL — ABNORMAL HIGH (ref 0.40–1.50)
GFR: 35.94 mL/min — AB (ref >60.00)
Glucose, Bld: 195 mg/dL — ABNORMAL HIGH (ref 70–99)
Potassium: 4 mEq/L (ref 3.5–5.1)
SODIUM: 138 meq/L (ref 135–145)

## 2017-05-13 LAB — HEPATIC FUNCTION PANEL
ALK PHOS: 81 U/L (ref 39–117)
ALT: 19 U/L (ref 0–53)
AST: 13 U/L (ref 0–37)
Albumin: 3.5 g/dL (ref 3.5–5.2)
BILIRUBIN DIRECT: 0.1 mg/dL (ref 0.0–0.3)
BILIRUBIN TOTAL: 0.4 mg/dL (ref 0.2–1.2)
Total Protein: 7 g/dL (ref 6.0–8.3)

## 2017-05-13 LAB — HEMOGLOBIN A1C: Hgb A1c MFr Bld: 7.2 % — ABNORMAL HIGH (ref 4.6–6.5)

## 2017-05-13 NOTE — Assessment & Plan Note (Signed)
Falling  Pt needs to go back to [facility]  PT/OT

## 2017-05-13 NOTE — Assessment & Plan Note (Signed)
Pt needs to go back to [facility]

## 2017-05-13 NOTE — Assessment & Plan Note (Signed)
Hydralazine, Imdur,Toprol

## 2017-05-13 NOTE — Assessment & Plan Note (Signed)
Labs

## 2017-05-13 NOTE — Progress Notes (Signed)
Subjective:  Patient ID: Georgeanne Nim Sr., male    DOB: 09-20-1940  Age: 77 y.o. MRN: [id number]  CC: No chief complaint on file.   HPI [name] presents for HTN, DM, FTT f/u Pt needs to go back to [facility] or another facility I'm asked to fill out FL2 form  Outpatient Medications Prior to Visit  Medication Sig Dispense Refill  . ACCU-CHEK SOFTCLIX LANCETS lancets Used to check blood sugar BID. DX:E11.311 100 each 12  . aspirin 81 MG tablet Take 1 tablet (81 mg total) by mouth daily. 30 tablet 3  . Blood Glucose Monitoring Suppl (ACCU-CHEK NANO SMARTVIEW) w/Device KIT Used to check blood sugar BID. DX:E11.311 1 kit 0  . cholecalciferol (VITAMIN D) 1000 units tablet Take 1 tablet (1,000 Units total) by mouth daily. 100 tablet 3  . clopidogrel (PLAVIX) 75 MG tablet Take 1 tablet (75 mg total) by mouth daily. 90 tablet 1  . docusate sodium (COLACE) 100 MG capsule Take 1 capsule (100 mg total) by mouth 2 (two) times daily. 10 capsule 0  . finasteride (PROSCAR) 5 MG tablet Take 1 tablet (5 mg total) by mouth daily. For Prostate 90 tablet 3  . FLUoxetine (PROZAC) 20 MG tablet Take 1 tablet (20 mg total) by mouth daily. 30 tablet 11  . gabapentin (NEURONTIN) 100 MG capsule Take 1 capsule (100 mg total) 3 (three) times daily by mouth. For pain 90 capsule 5  . glucose blood (ACCU-CHEK SMARTVIEW) test strip Used to check blood sugar BID. DX:E11.311 100 each 12  . hydrALAZINE (APRESOLINE) 10 MG tablet Take 1 tablet (10 mg total) by mouth every 8 (eight) hours. 90 tablet 1  . isosorbide mononitrate (IMDUR) 30 MG 24 hr tablet Take 1 tablet (30 mg total) by mouth daily. 30 tablet 3  . lovastatin (MEVACOR) 20 MG tablet Take 1 tablet (20 mg total) by mouth daily. For Cholesterol 90 tablet 3  . metoprolol succinate (TOPROL-XL) 100 MG 24 hr tablet Take 1 tablet (100 mg total) by mouth daily. Take with or immediately following a meal. 30 tablet 3  . pantoprazole (PROTONIX) 40 MG  tablet Take 1 tablet (40 mg total) by mouth daily. 90 tablet 1  . ranitidine (ZANTAC) 150 MG tablet Take 1 tablet (150 mg total) by mouth at bedtime. 30 tablet 1  . repaglinide (PRANDIN) 1 MG tablet Take 1 tablet (1 mg total) by mouth 3 (three) times daily before meals. 90 tablet 11  . Skin Protectants, Misc. (EUCERIN) cream Apply 1 application topically 2 (two) times daily as needed for dry skin.    Marland Kitchen blood glucose meter kit and supplies KIT Dispense based on patient and insurance preference. Use up to four times daily as directed. (FOR ICD-9 250.00, 250.01). 1 each 0   No facility-administered medications prior to visit.     ROS Review of Systems  Constitutional: Positive for fatigue. Negative for appetite change and unexpected weight change.  HENT: Negative for congestion, nosebleeds, sneezing, sore throat and trouble swallowing.   Eyes: Positive for visual disturbance. Negative for itching.  Respiratory: Negative for cough.   Cardiovascular: Negative for chest pain, palpitations and leg swelling.  Gastrointestinal: Negative for abdominal distention, blood in stool, diarrhea and nausea.  Genitourinary: Negative for frequency and hematuria.  Musculoskeletal: Positive for arthralgias and gait problem. Negative for back pain, joint swelling and neck pain.  Skin: Negative for rash.  Neurological: Positive for weakness. Negative for dizziness, tremors and speech difficulty.  Psychiatric/Behavioral: Positive for dysphoric mood. Negative for agitation, sleep disturbance and suicidal ideas. The patient is not nervous/anxious.     Objective:  BP 122/64 (BP Location: Left Arm, Patient Position: Sitting, Cuff Size: Large)   Pulse (!) 57   Temp 98.2 F (36.8 C) (Oral)   Ht '5\' 6"'  (1.676 m)   Wt 190 lb (86.2 kg)   SpO2 98%   BMI 30.67 kg/m   BP Readings from Last 3 Encounters:  05/13/17 122/64  11/30/16 126/74  11/11/16 (!) 145/88    Wt Readings from Last 3 Encounters:  05/13/17 190 lb  (86.2 kg)  11/30/16 191 lb (86.6 kg)  11/11/16 200 lb (90.7 kg)    Physical Exam  Constitutional: He is oriented to person, place, and time. He appears well-developed. No distress.  NAD  HENT:  Mouth/Throat: Oropharynx is clear and moist.  Eyes: Pupils are equal, round, and reactive to light. Conjunctivae are normal.  Neck: Normal range of motion. No JVD present. No thyromegaly present.  Cardiovascular: Normal rate, regular rhythm, normal heart sounds and intact distal pulses. Exam reveals no gallop and no friction rub.  No murmur heard. Pulmonary/Chest: Effort normal and breath sounds normal. No respiratory distress. He has no wheezes. He has no rales. He exhibits no tenderness.  Abdominal: Soft. Bowel sounds are normal. He exhibits no distension and no mass. There is no tenderness. There is no rebound and no guarding.  Musculoskeletal: Normal range of motion. He exhibits no edema or tenderness.  Lymphadenopathy:    He has no cervical adenopathy.  Neurological: He is alert and oriented to person, place, and time. He has normal reflexes. A sensory deficit is present. No cranial nerve deficit. He exhibits normal muscle tone. He displays a negative Romberg sign. Coordination abnormal. Gait normal.  Skin: Skin is warm and dry. No rash noted.  Psychiatric: His behavior is normal. Judgment and thought content normal.    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 Legally blind. History of diabetes, hypertension, and hypercholesterolemia. EXAM: CT HEAD WITHOUT CONTRAST TECHNIQUE: Contiguous axial images were obtained from the base of the skull through the vertex without intravenous contrast. COMPARISON:  CT and MR of the head 05/15/2016. FINDINGS: Brain: No evidence for acute infarction, hemorrhage, mass lesion, or extra-axial fluid. Generalized atrophy. Hydrocephalus ex vacuo. Hypoattenuation of white matter, consistent with small vessel disease. Chronic cerebellar and LEFT thalamus infarcts. Vascular: Calcification of the cavernous internal carotid arteries consistent with cerebrovascular atherosclerotic disease. No signs of intracranial large vessel occlusion. Skull: Normal. Negative for fracture or focal lesion. Sinuses/Orbits: No acute finding. Ocular banding RIGHT globe. Cataract extraction LEFT globe. Other: None. IMPRESSION: Atrophy and small vessel disease.  Evidence for  chronic ischemia. No acute intracranial findings are evident. Electronically Signed   By: Staci Righter M.D.   On: 11/11/2016 09:38    Assessment & Plan:   There are no diagnoses linked to this encounter. I have discontinued Ogle Seoane Sr.'s blood glucose meter kit and supplies. I am also having him maintain his cholecalciferol, finasteride, lovastatin, eucerin, clopidogrel, hydrALAZINE, repaglinide, FLUoxetine, aspirin EC, isosorbide mononitrate, metoprolol succinate, ranitidine, pantoprazole, docusate sodium, gabapentin, ACCU-CHEK NANO SMARTVIEW, glucose blood, and ACCU-CHEK SOFTCLIX LANCETS.  No orders of the defined types were placed in this encounter.    Follow-up: No follow-ups on file.  Walker Kehr, MD

## 2017-05-13 NOTE — Addendum Note (Signed)
Addended by: Karren Cobble on: 05/13/2017 04:49 PM   Modules accepted: Orders

## 2017-05-14 DIAGNOSIS — F4323 Adjustment disorder with mixed anxiety and depressed mood: Secondary | ICD-10-CM | POA: Diagnosis not present

## 2017-05-14 DIAGNOSIS — Z7984 Long term (current) use of oral hypoglycemic drugs: Secondary | ICD-10-CM

## 2017-05-14 DIAGNOSIS — Z6832 Body mass index (BMI) 32.0-32.9, adult: Secondary | ICD-10-CM | POA: Diagnosis not present

## 2017-05-14 DIAGNOSIS — Z7902 Long term (current) use of antithrombotics/antiplatelets: Secondary | ICD-10-CM

## 2017-05-14 DIAGNOSIS — I69398 Other sequelae of cerebral infarction: Secondary | ICD-10-CM | POA: Diagnosis not present

## 2017-05-14 DIAGNOSIS — H42 Glaucoma in diseases classified elsewhere: Secondary | ICD-10-CM | POA: Diagnosis not present

## 2017-05-14 DIAGNOSIS — N183 Chronic kidney disease, stage 3 (moderate): Secondary | ICD-10-CM | POA: Diagnosis not present

## 2017-05-14 DIAGNOSIS — M6281 Muscle weakness (generalized): Secondary | ICD-10-CM | POA: Diagnosis not present

## 2017-05-14 DIAGNOSIS — I252 Old myocardial infarction: Secondary | ICD-10-CM | POA: Diagnosis not present

## 2017-05-14 DIAGNOSIS — E1122 Type 2 diabetes mellitus with diabetic chronic kidney disease: Secondary | ICD-10-CM | POA: Diagnosis not present

## 2017-05-14 DIAGNOSIS — E669 Obesity, unspecified: Secondary | ICD-10-CM | POA: Diagnosis not present

## 2017-05-14 DIAGNOSIS — I1310 Hypertensive heart and chronic kidney disease without heart failure, with stage 1 through stage 4 chronic kidney disease, or unspecified chronic kidney disease: Secondary | ICD-10-CM | POA: Diagnosis not present

## 2017-05-14 DIAGNOSIS — H548 Legal blindness, as defined in USA: Secondary | ICD-10-CM | POA: Diagnosis not present

## 2017-05-14 DIAGNOSIS — E1139 Type 2 diabetes mellitus with other diabetic ophthalmic complication: Secondary | ICD-10-CM | POA: Diagnosis not present

## 2017-05-15 DIAGNOSIS — I69398 Other sequelae of cerebral infarction: Secondary | ICD-10-CM | POA: Diagnosis not present

## 2017-05-15 DIAGNOSIS — I1310 Hypertensive heart and chronic kidney disease without heart failure, with stage 1 through stage 4 chronic kidney disease, or unspecified chronic kidney disease: Secondary | ICD-10-CM | POA: Diagnosis not present

## 2017-05-15 DIAGNOSIS — M6281 Muscle weakness (generalized): Secondary | ICD-10-CM | POA: Diagnosis not present

## 2017-05-15 DIAGNOSIS — E1139 Type 2 diabetes mellitus with other diabetic ophthalmic complication: Secondary | ICD-10-CM | POA: Diagnosis not present

## 2017-05-15 DIAGNOSIS — H548 Legal blindness, as defined in USA: Secondary | ICD-10-CM | POA: Diagnosis not present

## 2017-05-15 DIAGNOSIS — H42 Glaucoma in diseases classified elsewhere: Secondary | ICD-10-CM | POA: Diagnosis not present

## 2017-05-15 DIAGNOSIS — E1122 Type 2 diabetes mellitus with diabetic chronic kidney disease: Secondary | ICD-10-CM | POA: Diagnosis not present

## 2017-05-15 DIAGNOSIS — N183 Chronic kidney disease, stage 3 (moderate): Secondary | ICD-10-CM | POA: Diagnosis not present

## 2017-05-15 DIAGNOSIS — I252 Old myocardial infarction: Secondary | ICD-10-CM | POA: Diagnosis not present

## 2017-05-16 ENCOUNTER — Other Ambulatory Visit: Payer: Self-pay

## 2017-05-16 MED ORDER — ACCU-CHEK AVIVA PLUS W/DEVICE KIT: PACK | 0 refills | Status: AC

## 2017-05-17 DIAGNOSIS — I252 Old myocardial infarction: Secondary | ICD-10-CM | POA: Diagnosis not present

## 2017-05-17 DIAGNOSIS — E1122 Type 2 diabetes mellitus with diabetic chronic kidney disease: Secondary | ICD-10-CM | POA: Diagnosis not present

## 2017-05-17 DIAGNOSIS — I1310 Hypertensive heart and chronic kidney disease without heart failure, with stage 1 through stage 4 chronic kidney disease, or unspecified chronic kidney disease: Secondary | ICD-10-CM | POA: Diagnosis not present

## 2017-05-17 DIAGNOSIS — I69398 Other sequelae of cerebral infarction: Secondary | ICD-10-CM | POA: Diagnosis not present

## 2017-05-17 DIAGNOSIS — N183 Chronic kidney disease, stage 3 (moderate): Secondary | ICD-10-CM | POA: Diagnosis not present

## 2017-05-17 DIAGNOSIS — E1139 Type 2 diabetes mellitus with other diabetic ophthalmic complication: Secondary | ICD-10-CM | POA: Diagnosis not present

## 2017-05-17 DIAGNOSIS — H548 Legal blindness, as defined in USA: Secondary | ICD-10-CM | POA: Diagnosis not present

## 2017-05-17 DIAGNOSIS — H42 Glaucoma in diseases classified elsewhere: Secondary | ICD-10-CM | POA: Diagnosis not present

## 2017-05-17 DIAGNOSIS — M6281 Muscle weakness (generalized): Secondary | ICD-10-CM | POA: Diagnosis not present

## 2017-05-20 DIAGNOSIS — R278 Other lack of coordination: Secondary | ICD-10-CM | POA: Diagnosis not present

## 2017-05-20 DIAGNOSIS — H5316 Psychophysical visual disturbances: Secondary | ICD-10-CM | POA: Diagnosis not present

## 2017-05-20 DIAGNOSIS — R488 Other symbolic dysfunctions: Secondary | ICD-10-CM | POA: Diagnosis not present

## 2017-05-21 DIAGNOSIS — E1122 Type 2 diabetes mellitus with diabetic chronic kidney disease: Secondary | ICD-10-CM | POA: Diagnosis not present

## 2017-05-21 DIAGNOSIS — R488 Other symbolic dysfunctions: Secondary | ICD-10-CM | POA: Diagnosis not present

## 2017-05-21 DIAGNOSIS — E785 Hyperlipidemia, unspecified: Secondary | ICD-10-CM | POA: Diagnosis not present

## 2017-05-21 DIAGNOSIS — N184 Chronic kidney disease, stage 4 (severe): Secondary | ICD-10-CM | POA: Diagnosis not present

## 2017-05-21 DIAGNOSIS — I10 Essential (primary) hypertension: Secondary | ICD-10-CM | POA: Diagnosis not present

## 2017-05-21 DIAGNOSIS — R278 Other lack of coordination: Secondary | ICD-10-CM | POA: Diagnosis not present

## 2017-05-21 DIAGNOSIS — H548 Legal blindness, as defined in USA: Secondary | ICD-10-CM | POA: Diagnosis not present

## 2017-05-21 DIAGNOSIS — H5316 Psychophysical visual disturbances: Secondary | ICD-10-CM | POA: Diagnosis not present

## 2017-05-22 DIAGNOSIS — R278 Other lack of coordination: Secondary | ICD-10-CM | POA: Diagnosis not present

## 2017-05-22 DIAGNOSIS — H5316 Psychophysical visual disturbances: Secondary | ICD-10-CM | POA: Diagnosis not present

## 2017-05-22 DIAGNOSIS — R488 Other symbolic dysfunctions: Secondary | ICD-10-CM | POA: Diagnosis not present

## 2017-05-22 DIAGNOSIS — H548 Legal blindness, as defined in USA: Secondary | ICD-10-CM | POA: Diagnosis not present

## 2017-05-22 DIAGNOSIS — N184 Chronic kidney disease, stage 4 (severe): Secondary | ICD-10-CM | POA: Diagnosis not present

## 2017-05-22 DIAGNOSIS — K219 Gastro-esophageal reflux disease without esophagitis: Secondary | ICD-10-CM | POA: Diagnosis not present

## 2017-05-23 DIAGNOSIS — N184 Chronic kidney disease, stage 4 (severe): Secondary | ICD-10-CM | POA: Diagnosis not present

## 2017-05-23 DIAGNOSIS — H5316 Psychophysical visual disturbances: Secondary | ICD-10-CM | POA: Diagnosis not present

## 2017-05-23 DIAGNOSIS — H548 Legal blindness, as defined in USA: Secondary | ICD-10-CM | POA: Diagnosis not present

## 2017-05-23 DIAGNOSIS — E1142 Type 2 diabetes mellitus with diabetic polyneuropathy: Secondary | ICD-10-CM | POA: Diagnosis not present

## 2017-05-23 DIAGNOSIS — I10 Essential (primary) hypertension: Secondary | ICD-10-CM | POA: Diagnosis not present

## 2017-05-23 DIAGNOSIS — K219 Gastro-esophageal reflux disease without esophagitis: Secondary | ICD-10-CM | POA: Diagnosis not present

## 2017-05-23 DIAGNOSIS — R278 Other lack of coordination: Secondary | ICD-10-CM | POA: Diagnosis not present

## 2017-05-23 DIAGNOSIS — R488 Other symbolic dysfunctions: Secondary | ICD-10-CM | POA: Diagnosis not present

## 2017-05-24 DIAGNOSIS — H548 Legal blindness, as defined in USA: Secondary | ICD-10-CM | POA: Diagnosis not present

## 2017-05-24 DIAGNOSIS — K219 Gastro-esophageal reflux disease without esophagitis: Secondary | ICD-10-CM | POA: Diagnosis not present

## 2017-05-24 DIAGNOSIS — H5316 Psychophysical visual disturbances: Secondary | ICD-10-CM | POA: Diagnosis not present

## 2017-05-24 DIAGNOSIS — R278 Other lack of coordination: Secondary | ICD-10-CM | POA: Diagnosis not present

## 2017-05-24 DIAGNOSIS — N184 Chronic kidney disease, stage 4 (severe): Secondary | ICD-10-CM | POA: Diagnosis not present

## 2017-05-24 DIAGNOSIS — R488 Other symbolic dysfunctions: Secondary | ICD-10-CM | POA: Diagnosis not present

## 2017-05-25 DIAGNOSIS — K219 Gastro-esophageal reflux disease without esophagitis: Secondary | ICD-10-CM | POA: Diagnosis not present

## 2017-05-25 DIAGNOSIS — H5316 Psychophysical visual disturbances: Secondary | ICD-10-CM | POA: Diagnosis not present

## 2017-05-25 DIAGNOSIS — N184 Chronic kidney disease, stage 4 (severe): Secondary | ICD-10-CM | POA: Diagnosis not present

## 2017-05-25 DIAGNOSIS — H548 Legal blindness, as defined in USA: Secondary | ICD-10-CM | POA: Diagnosis not present

## 2017-05-25 DIAGNOSIS — R278 Other lack of coordination: Secondary | ICD-10-CM | POA: Diagnosis not present

## 2017-05-25 DIAGNOSIS — R488 Other symbolic dysfunctions: Secondary | ICD-10-CM | POA: Diagnosis not present

## 2017-05-27 DIAGNOSIS — R488 Other symbolic dysfunctions: Secondary | ICD-10-CM | POA: Diagnosis not present

## 2017-05-27 DIAGNOSIS — K219 Gastro-esophageal reflux disease without esophagitis: Secondary | ICD-10-CM | POA: Diagnosis not present

## 2017-05-27 DIAGNOSIS — H548 Legal blindness, as defined in USA: Secondary | ICD-10-CM | POA: Diagnosis not present

## 2017-05-27 DIAGNOSIS — H5316 Psychophysical visual disturbances: Secondary | ICD-10-CM | POA: Diagnosis not present

## 2017-05-27 DIAGNOSIS — R278 Other lack of coordination: Secondary | ICD-10-CM | POA: Diagnosis not present

## 2017-05-27 DIAGNOSIS — N184 Chronic kidney disease, stage 4 (severe): Secondary | ICD-10-CM | POA: Diagnosis not present

## 2017-05-28 DIAGNOSIS — N184 Chronic kidney disease, stage 4 (severe): Secondary | ICD-10-CM | POA: Diagnosis not present

## 2017-05-28 DIAGNOSIS — R488 Other symbolic dysfunctions: Secondary | ICD-10-CM | POA: Diagnosis not present

## 2017-05-28 DIAGNOSIS — H5316 Psychophysical visual disturbances: Secondary | ICD-10-CM | POA: Diagnosis not present

## 2017-05-28 DIAGNOSIS — R278 Other lack of coordination: Secondary | ICD-10-CM | POA: Diagnosis not present

## 2017-05-28 DIAGNOSIS — H548 Legal blindness, as defined in USA: Secondary | ICD-10-CM | POA: Diagnosis not present

## 2017-05-28 DIAGNOSIS — I10 Essential (primary) hypertension: Secondary | ICD-10-CM | POA: Diagnosis not present

## 2017-05-28 DIAGNOSIS — E1142 Type 2 diabetes mellitus with diabetic polyneuropathy: Secondary | ICD-10-CM | POA: Diagnosis not present

## 2017-05-28 DIAGNOSIS — K219 Gastro-esophageal reflux disease without esophagitis: Secondary | ICD-10-CM | POA: Diagnosis not present

## 2017-05-29 DIAGNOSIS — R488 Other symbolic dysfunctions: Secondary | ICD-10-CM | POA: Diagnosis not present

## 2017-05-29 DIAGNOSIS — R278 Other lack of coordination: Secondary | ICD-10-CM | POA: Diagnosis not present

## 2017-05-29 DIAGNOSIS — H548 Legal blindness, as defined in USA: Secondary | ICD-10-CM | POA: Diagnosis not present

## 2017-05-29 DIAGNOSIS — N184 Chronic kidney disease, stage 4 (severe): Secondary | ICD-10-CM | POA: Diagnosis not present

## 2017-05-29 DIAGNOSIS — K219 Gastro-esophageal reflux disease without esophagitis: Secondary | ICD-10-CM | POA: Diagnosis not present

## 2017-05-29 DIAGNOSIS — H5316 Psychophysical visual disturbances: Secondary | ICD-10-CM | POA: Diagnosis not present

## 2017-05-30 DIAGNOSIS — R278 Other lack of coordination: Secondary | ICD-10-CM | POA: Diagnosis not present

## 2017-05-30 DIAGNOSIS — K219 Gastro-esophageal reflux disease without esophagitis: Secondary | ICD-10-CM | POA: Diagnosis not present

## 2017-05-30 DIAGNOSIS — H548 Legal blindness, as defined in USA: Secondary | ICD-10-CM | POA: Diagnosis not present

## 2017-05-30 DIAGNOSIS — H5316 Psychophysical visual disturbances: Secondary | ICD-10-CM | POA: Diagnosis not present

## 2017-05-30 DIAGNOSIS — R488 Other symbolic dysfunctions: Secondary | ICD-10-CM | POA: Diagnosis not present

## 2017-05-30 DIAGNOSIS — N184 Chronic kidney disease, stage 4 (severe): Secondary | ICD-10-CM | POA: Diagnosis not present

## 2017-05-31 DIAGNOSIS — K219 Gastro-esophageal reflux disease without esophagitis: Secondary | ICD-10-CM | POA: Diagnosis not present

## 2017-05-31 DIAGNOSIS — H548 Legal blindness, as defined in USA: Secondary | ICD-10-CM | POA: Diagnosis not present

## 2017-05-31 DIAGNOSIS — N184 Chronic kidney disease, stage 4 (severe): Secondary | ICD-10-CM | POA: Diagnosis not present

## 2017-05-31 DIAGNOSIS — R488 Other symbolic dysfunctions: Secondary | ICD-10-CM | POA: Diagnosis not present

## 2017-05-31 DIAGNOSIS — R278 Other lack of coordination: Secondary | ICD-10-CM | POA: Diagnosis not present

## 2017-05-31 DIAGNOSIS — H5316 Psychophysical visual disturbances: Secondary | ICD-10-CM | POA: Diagnosis not present

## 2017-06-03 DIAGNOSIS — H5316 Psychophysical visual disturbances: Secondary | ICD-10-CM | POA: Diagnosis not present

## 2017-06-03 DIAGNOSIS — R278 Other lack of coordination: Secondary | ICD-10-CM | POA: Diagnosis not present

## 2017-06-03 DIAGNOSIS — N184 Chronic kidney disease, stage 4 (severe): Secondary | ICD-10-CM | POA: Diagnosis not present

## 2017-06-03 DIAGNOSIS — H548 Legal blindness, as defined in USA: Secondary | ICD-10-CM | POA: Diagnosis not present

## 2017-06-03 DIAGNOSIS — R488 Other symbolic dysfunctions: Secondary | ICD-10-CM | POA: Diagnosis not present

## 2017-06-03 DIAGNOSIS — K219 Gastro-esophageal reflux disease without esophagitis: Secondary | ICD-10-CM | POA: Diagnosis not present

## 2017-06-04 DIAGNOSIS — H5316 Psychophysical visual disturbances: Secondary | ICD-10-CM | POA: Diagnosis not present

## 2017-06-04 DIAGNOSIS — N184 Chronic kidney disease, stage 4 (severe): Secondary | ICD-10-CM | POA: Diagnosis not present

## 2017-06-04 DIAGNOSIS — R488 Other symbolic dysfunctions: Secondary | ICD-10-CM | POA: Diagnosis not present

## 2017-06-04 DIAGNOSIS — H548 Legal blindness, as defined in USA: Secondary | ICD-10-CM | POA: Diagnosis not present

## 2017-06-04 DIAGNOSIS — K219 Gastro-esophageal reflux disease without esophagitis: Secondary | ICD-10-CM | POA: Diagnosis not present

## 2017-06-04 DIAGNOSIS — R278 Other lack of coordination: Secondary | ICD-10-CM | POA: Diagnosis not present

## 2017-06-05 DIAGNOSIS — N184 Chronic kidney disease, stage 4 (severe): Secondary | ICD-10-CM | POA: Diagnosis not present

## 2017-06-05 DIAGNOSIS — R488 Other symbolic dysfunctions: Secondary | ICD-10-CM | POA: Diagnosis not present

## 2017-06-05 DIAGNOSIS — R278 Other lack of coordination: Secondary | ICD-10-CM | POA: Diagnosis not present

## 2017-06-05 DIAGNOSIS — H548 Legal blindness, as defined in USA: Secondary | ICD-10-CM | POA: Diagnosis not present

## 2017-06-05 DIAGNOSIS — K219 Gastro-esophageal reflux disease without esophagitis: Secondary | ICD-10-CM | POA: Diagnosis not present

## 2017-06-05 DIAGNOSIS — H5316 Psychophysical visual disturbances: Secondary | ICD-10-CM | POA: Diagnosis not present

## 2017-06-06 DIAGNOSIS — N184 Chronic kidney disease, stage 4 (severe): Secondary | ICD-10-CM | POA: Diagnosis not present

## 2017-06-06 DIAGNOSIS — H548 Legal blindness, as defined in USA: Secondary | ICD-10-CM | POA: Diagnosis not present

## 2017-06-06 DIAGNOSIS — H5316 Psychophysical visual disturbances: Secondary | ICD-10-CM | POA: Diagnosis not present

## 2017-06-06 DIAGNOSIS — R278 Other lack of coordination: Secondary | ICD-10-CM | POA: Diagnosis not present

## 2017-06-06 DIAGNOSIS — K219 Gastro-esophageal reflux disease without esophagitis: Secondary | ICD-10-CM | POA: Diagnosis not present

## 2017-06-06 DIAGNOSIS — R488 Other symbolic dysfunctions: Secondary | ICD-10-CM | POA: Diagnosis not present

## 2017-06-07 DIAGNOSIS — K219 Gastro-esophageal reflux disease without esophagitis: Secondary | ICD-10-CM | POA: Diagnosis not present

## 2017-06-07 DIAGNOSIS — H548 Legal blindness, as defined in USA: Secondary | ICD-10-CM | POA: Diagnosis not present

## 2017-06-07 DIAGNOSIS — H5316 Psychophysical visual disturbances: Secondary | ICD-10-CM | POA: Diagnosis not present

## 2017-06-07 DIAGNOSIS — R278 Other lack of coordination: Secondary | ICD-10-CM | POA: Diagnosis not present

## 2017-06-07 DIAGNOSIS — R488 Other symbolic dysfunctions: Secondary | ICD-10-CM | POA: Diagnosis not present

## 2017-06-07 DIAGNOSIS — N184 Chronic kidney disease, stage 4 (severe): Secondary | ICD-10-CM | POA: Diagnosis not present

## 2017-06-10 DIAGNOSIS — R278 Other lack of coordination: Secondary | ICD-10-CM | POA: Diagnosis not present

## 2017-06-10 DIAGNOSIS — H548 Legal blindness, as defined in USA: Secondary | ICD-10-CM | POA: Diagnosis not present

## 2017-06-10 DIAGNOSIS — K219 Gastro-esophageal reflux disease without esophagitis: Secondary | ICD-10-CM | POA: Diagnosis not present

## 2017-06-10 DIAGNOSIS — N184 Chronic kidney disease, stage 4 (severe): Secondary | ICD-10-CM | POA: Diagnosis not present

## 2017-06-10 DIAGNOSIS — R488 Other symbolic dysfunctions: Secondary | ICD-10-CM | POA: Diagnosis not present

## 2017-06-10 DIAGNOSIS — H5316 Psychophysical visual disturbances: Secondary | ICD-10-CM | POA: Diagnosis not present

## 2017-06-11 DIAGNOSIS — N184 Chronic kidney disease, stage 4 (severe): Secondary | ICD-10-CM | POA: Diagnosis not present

## 2017-06-11 DIAGNOSIS — N183 Chronic kidney disease, stage 3 (moderate): Secondary | ICD-10-CM | POA: Diagnosis not present

## 2017-06-11 DIAGNOSIS — H548 Legal blindness, as defined in USA: Secondary | ICD-10-CM | POA: Diagnosis not present

## 2017-06-11 DIAGNOSIS — K219 Gastro-esophageal reflux disease without esophagitis: Secondary | ICD-10-CM | POA: Diagnosis not present

## 2017-06-11 DIAGNOSIS — I119 Hypertensive heart disease without heart failure: Secondary | ICD-10-CM | POA: Diagnosis not present

## 2017-06-11 DIAGNOSIS — H5316 Psychophysical visual disturbances: Secondary | ICD-10-CM | POA: Diagnosis not present

## 2017-06-11 DIAGNOSIS — E1122 Type 2 diabetes mellitus with diabetic chronic kidney disease: Secondary | ICD-10-CM | POA: Diagnosis not present

## 2017-06-11 DIAGNOSIS — R278 Other lack of coordination: Secondary | ICD-10-CM | POA: Diagnosis not present

## 2017-06-11 DIAGNOSIS — R488 Other symbolic dysfunctions: Secondary | ICD-10-CM | POA: Diagnosis not present

## 2017-06-11 DIAGNOSIS — I10 Essential (primary) hypertension: Secondary | ICD-10-CM | POA: Diagnosis not present

## 2017-06-12 DIAGNOSIS — R278 Other lack of coordination: Secondary | ICD-10-CM | POA: Diagnosis not present

## 2017-06-12 DIAGNOSIS — N184 Chronic kidney disease, stage 4 (severe): Secondary | ICD-10-CM | POA: Diagnosis not present

## 2017-06-12 DIAGNOSIS — H5316 Psychophysical visual disturbances: Secondary | ICD-10-CM | POA: Diagnosis not present

## 2017-06-12 DIAGNOSIS — H548 Legal blindness, as defined in USA: Secondary | ICD-10-CM | POA: Diagnosis not present

## 2017-06-12 DIAGNOSIS — R488 Other symbolic dysfunctions: Secondary | ICD-10-CM | POA: Diagnosis not present

## 2017-06-12 DIAGNOSIS — K219 Gastro-esophageal reflux disease without esophagitis: Secondary | ICD-10-CM | POA: Diagnosis not present

## 2017-06-13 DIAGNOSIS — N184 Chronic kidney disease, stage 4 (severe): Secondary | ICD-10-CM | POA: Diagnosis not present

## 2017-06-13 DIAGNOSIS — R488 Other symbolic dysfunctions: Secondary | ICD-10-CM | POA: Diagnosis not present

## 2017-06-13 DIAGNOSIS — K219 Gastro-esophageal reflux disease without esophagitis: Secondary | ICD-10-CM | POA: Diagnosis not present

## 2017-06-13 DIAGNOSIS — H548 Legal blindness, as defined in USA: Secondary | ICD-10-CM | POA: Diagnosis not present

## 2017-06-13 DIAGNOSIS — H5316 Psychophysical visual disturbances: Secondary | ICD-10-CM | POA: Diagnosis not present

## 2017-06-13 DIAGNOSIS — R278 Other lack of coordination: Secondary | ICD-10-CM | POA: Diagnosis not present

## 2017-06-14 DIAGNOSIS — K219 Gastro-esophageal reflux disease without esophagitis: Secondary | ICD-10-CM | POA: Diagnosis not present

## 2017-06-14 DIAGNOSIS — R488 Other symbolic dysfunctions: Secondary | ICD-10-CM | POA: Diagnosis not present

## 2017-06-14 DIAGNOSIS — H5316 Psychophysical visual disturbances: Secondary | ICD-10-CM | POA: Diagnosis not present

## 2017-06-14 DIAGNOSIS — R278 Other lack of coordination: Secondary | ICD-10-CM | POA: Diagnosis not present

## 2017-06-14 DIAGNOSIS — N184 Chronic kidney disease, stage 4 (severe): Secondary | ICD-10-CM | POA: Diagnosis not present

## 2017-06-14 DIAGNOSIS — H548 Legal blindness, as defined in USA: Secondary | ICD-10-CM | POA: Diagnosis not present

## 2017-06-16 DIAGNOSIS — R488 Other symbolic dysfunctions: Secondary | ICD-10-CM | POA: Diagnosis not present

## 2017-06-16 DIAGNOSIS — H5316 Psychophysical visual disturbances: Secondary | ICD-10-CM | POA: Diagnosis not present

## 2017-06-16 DIAGNOSIS — R278 Other lack of coordination: Secondary | ICD-10-CM | POA: Diagnosis not present

## 2017-06-16 DIAGNOSIS — K219 Gastro-esophageal reflux disease without esophagitis: Secondary | ICD-10-CM | POA: Diagnosis not present

## 2017-06-16 DIAGNOSIS — N184 Chronic kidney disease, stage 4 (severe): Secondary | ICD-10-CM | POA: Diagnosis not present

## 2017-06-16 DIAGNOSIS — H548 Legal blindness, as defined in USA: Secondary | ICD-10-CM | POA: Diagnosis not present

## 2017-06-17 DIAGNOSIS — R488 Other symbolic dysfunctions: Secondary | ICD-10-CM | POA: Diagnosis not present

## 2017-06-17 DIAGNOSIS — H5316 Psychophysical visual disturbances: Secondary | ICD-10-CM | POA: Diagnosis not present

## 2017-06-17 DIAGNOSIS — K219 Gastro-esophageal reflux disease without esophagitis: Secondary | ICD-10-CM | POA: Diagnosis not present

## 2017-06-17 DIAGNOSIS — R278 Other lack of coordination: Secondary | ICD-10-CM | POA: Diagnosis not present

## 2017-06-17 DIAGNOSIS — H548 Legal blindness, as defined in USA: Secondary | ICD-10-CM | POA: Diagnosis not present

## 2017-06-17 DIAGNOSIS — N184 Chronic kidney disease, stage 4 (severe): Secondary | ICD-10-CM | POA: Diagnosis not present

## 2017-06-18 DIAGNOSIS — N184 Chronic kidney disease, stage 4 (severe): Secondary | ICD-10-CM | POA: Diagnosis not present

## 2017-06-18 DIAGNOSIS — R278 Other lack of coordination: Secondary | ICD-10-CM | POA: Diagnosis not present

## 2017-06-18 DIAGNOSIS — R488 Other symbolic dysfunctions: Secondary | ICD-10-CM | POA: Diagnosis not present

## 2017-06-18 DIAGNOSIS — K219 Gastro-esophageal reflux disease without esophagitis: Secondary | ICD-10-CM | POA: Diagnosis not present

## 2017-06-18 DIAGNOSIS — H548 Legal blindness, as defined in USA: Secondary | ICD-10-CM | POA: Diagnosis not present

## 2017-06-18 DIAGNOSIS — H5316 Psychophysical visual disturbances: Secondary | ICD-10-CM | POA: Diagnosis not present

## 2017-06-19 DIAGNOSIS — K219 Gastro-esophageal reflux disease without esophagitis: Secondary | ICD-10-CM | POA: Diagnosis not present

## 2017-06-19 DIAGNOSIS — N184 Chronic kidney disease, stage 4 (severe): Secondary | ICD-10-CM | POA: Diagnosis not present

## 2017-06-19 DIAGNOSIS — R488 Other symbolic dysfunctions: Secondary | ICD-10-CM | POA: Diagnosis not present

## 2017-06-19 DIAGNOSIS — R278 Other lack of coordination: Secondary | ICD-10-CM | POA: Diagnosis not present

## 2017-06-19 DIAGNOSIS — H5316 Psychophysical visual disturbances: Secondary | ICD-10-CM | POA: Diagnosis not present

## 2017-06-19 DIAGNOSIS — H548 Legal blindness, as defined in USA: Secondary | ICD-10-CM | POA: Diagnosis not present

## 2017-06-20 DIAGNOSIS — R488 Other symbolic dysfunctions: Secondary | ICD-10-CM | POA: Diagnosis not present

## 2017-06-20 DIAGNOSIS — H548 Legal blindness, as defined in USA: Secondary | ICD-10-CM | POA: Diagnosis not present

## 2017-06-20 DIAGNOSIS — R278 Other lack of coordination: Secondary | ICD-10-CM | POA: Diagnosis not present

## 2017-06-20 DIAGNOSIS — N184 Chronic kidney disease, stage 4 (severe): Secondary | ICD-10-CM | POA: Diagnosis not present

## 2017-06-20 DIAGNOSIS — H5316 Psychophysical visual disturbances: Secondary | ICD-10-CM | POA: Diagnosis not present

## 2017-06-20 DIAGNOSIS — K219 Gastro-esophageal reflux disease without esophagitis: Secondary | ICD-10-CM | POA: Diagnosis not present

## 2017-06-21 DIAGNOSIS — R278 Other lack of coordination: Secondary | ICD-10-CM | POA: Diagnosis not present

## 2017-06-21 DIAGNOSIS — R488 Other symbolic dysfunctions: Secondary | ICD-10-CM | POA: Diagnosis not present

## 2017-06-21 DIAGNOSIS — N184 Chronic kidney disease, stage 4 (severe): Secondary | ICD-10-CM | POA: Diagnosis not present

## 2017-06-21 DIAGNOSIS — H548 Legal blindness, as defined in USA: Secondary | ICD-10-CM | POA: Diagnosis not present

## 2017-06-21 DIAGNOSIS — H5316 Psychophysical visual disturbances: Secondary | ICD-10-CM | POA: Diagnosis not present

## 2017-06-21 DIAGNOSIS — K219 Gastro-esophageal reflux disease without esophagitis: Secondary | ICD-10-CM | POA: Diagnosis not present

## 2017-06-24 DIAGNOSIS — K219 Gastro-esophageal reflux disease without esophagitis: Secondary | ICD-10-CM | POA: Diagnosis not present

## 2017-06-24 DIAGNOSIS — H5316 Psychophysical visual disturbances: Secondary | ICD-10-CM | POA: Diagnosis not present

## 2017-06-24 DIAGNOSIS — N184 Chronic kidney disease, stage 4 (severe): Secondary | ICD-10-CM | POA: Diagnosis not present

## 2017-06-24 DIAGNOSIS — R278 Other lack of coordination: Secondary | ICD-10-CM | POA: Diagnosis not present

## 2017-06-24 DIAGNOSIS — H548 Legal blindness, as defined in USA: Secondary | ICD-10-CM | POA: Diagnosis not present

## 2017-06-25 DIAGNOSIS — H548 Legal blindness, as defined in USA: Secondary | ICD-10-CM | POA: Diagnosis not present

## 2017-06-25 DIAGNOSIS — H5316 Psychophysical visual disturbances: Secondary | ICD-10-CM | POA: Diagnosis not present

## 2017-06-25 DIAGNOSIS — N184 Chronic kidney disease, stage 4 (severe): Secondary | ICD-10-CM | POA: Diagnosis not present

## 2017-06-25 DIAGNOSIS — R278 Other lack of coordination: Secondary | ICD-10-CM | POA: Diagnosis not present

## 2017-06-25 DIAGNOSIS — K219 Gastro-esophageal reflux disease without esophagitis: Secondary | ICD-10-CM | POA: Diagnosis not present

## 2017-06-26 DIAGNOSIS — N184 Chronic kidney disease, stage 4 (severe): Secondary | ICD-10-CM | POA: Diagnosis not present

## 2017-06-26 DIAGNOSIS — R278 Other lack of coordination: Secondary | ICD-10-CM | POA: Diagnosis not present

## 2017-06-26 DIAGNOSIS — H5316 Psychophysical visual disturbances: Secondary | ICD-10-CM | POA: Diagnosis not present

## 2017-06-26 DIAGNOSIS — H548 Legal blindness, as defined in USA: Secondary | ICD-10-CM | POA: Diagnosis not present

## 2017-06-26 DIAGNOSIS — K219 Gastro-esophageal reflux disease without esophagitis: Secondary | ICD-10-CM | POA: Diagnosis not present

## 2017-06-27 DIAGNOSIS — E1142 Type 2 diabetes mellitus with diabetic polyneuropathy: Secondary | ICD-10-CM | POA: Diagnosis not present

## 2017-06-27 DIAGNOSIS — I10 Essential (primary) hypertension: Secondary | ICD-10-CM | POA: Diagnosis not present

## 2017-06-27 DIAGNOSIS — H5316 Psychophysical visual disturbances: Secondary | ICD-10-CM | POA: Diagnosis not present

## 2017-06-27 DIAGNOSIS — R278 Other lack of coordination: Secondary | ICD-10-CM | POA: Diagnosis not present

## 2017-06-27 DIAGNOSIS — H548 Legal blindness, as defined in USA: Secondary | ICD-10-CM | POA: Diagnosis not present

## 2017-06-27 DIAGNOSIS — N184 Chronic kidney disease, stage 4 (severe): Secondary | ICD-10-CM | POA: Diagnosis not present

## 2017-06-27 DIAGNOSIS — K219 Gastro-esophageal reflux disease without esophagitis: Secondary | ICD-10-CM | POA: Diagnosis not present

## 2017-06-28 DIAGNOSIS — H548 Legal blindness, as defined in USA: Secondary | ICD-10-CM | POA: Diagnosis not present

## 2017-06-28 DIAGNOSIS — H5316 Psychophysical visual disturbances: Secondary | ICD-10-CM | POA: Diagnosis not present

## 2017-06-28 DIAGNOSIS — N184 Chronic kidney disease, stage 4 (severe): Secondary | ICD-10-CM | POA: Diagnosis not present

## 2017-06-28 DIAGNOSIS — K219 Gastro-esophageal reflux disease without esophagitis: Secondary | ICD-10-CM | POA: Diagnosis not present

## 2017-06-28 DIAGNOSIS — R278 Other lack of coordination: Secondary | ICD-10-CM | POA: Diagnosis not present

## 2017-07-01 DIAGNOSIS — N184 Chronic kidney disease, stage 4 (severe): Secondary | ICD-10-CM | POA: Diagnosis not present

## 2017-07-01 DIAGNOSIS — K219 Gastro-esophageal reflux disease without esophagitis: Secondary | ICD-10-CM | POA: Diagnosis not present

## 2017-07-01 DIAGNOSIS — H548 Legal blindness, as defined in USA: Secondary | ICD-10-CM | POA: Diagnosis not present

## 2017-07-01 DIAGNOSIS — R278 Other lack of coordination: Secondary | ICD-10-CM | POA: Diagnosis not present

## 2017-07-01 DIAGNOSIS — H5316 Psychophysical visual disturbances: Secondary | ICD-10-CM | POA: Diagnosis not present

## 2017-07-02 DIAGNOSIS — K219 Gastro-esophageal reflux disease without esophagitis: Secondary | ICD-10-CM | POA: Diagnosis not present

## 2017-07-02 DIAGNOSIS — R278 Other lack of coordination: Secondary | ICD-10-CM | POA: Diagnosis not present

## 2017-07-02 DIAGNOSIS — N184 Chronic kidney disease, stage 4 (severe): Secondary | ICD-10-CM | POA: Diagnosis not present

## 2017-07-02 DIAGNOSIS — H548 Legal blindness, as defined in USA: Secondary | ICD-10-CM | POA: Diagnosis not present

## 2017-07-02 DIAGNOSIS — H5316 Psychophysical visual disturbances: Secondary | ICD-10-CM | POA: Diagnosis not present

## 2017-07-03 DIAGNOSIS — H5316 Psychophysical visual disturbances: Secondary | ICD-10-CM | POA: Diagnosis not present

## 2017-07-03 DIAGNOSIS — H548 Legal blindness, as defined in USA: Secondary | ICD-10-CM | POA: Diagnosis not present

## 2017-07-03 DIAGNOSIS — R278 Other lack of coordination: Secondary | ICD-10-CM | POA: Diagnosis not present

## 2017-07-03 DIAGNOSIS — K219 Gastro-esophageal reflux disease without esophagitis: Secondary | ICD-10-CM | POA: Diagnosis not present

## 2017-07-03 DIAGNOSIS — N184 Chronic kidney disease, stage 4 (severe): Secondary | ICD-10-CM | POA: Diagnosis not present

## 2017-07-04 DIAGNOSIS — R278 Other lack of coordination: Secondary | ICD-10-CM | POA: Diagnosis not present

## 2017-07-04 DIAGNOSIS — K219 Gastro-esophageal reflux disease without esophagitis: Secondary | ICD-10-CM | POA: Diagnosis not present

## 2017-07-04 DIAGNOSIS — H5316 Psychophysical visual disturbances: Secondary | ICD-10-CM | POA: Diagnosis not present

## 2017-07-04 DIAGNOSIS — N184 Chronic kidney disease, stage 4 (severe): Secondary | ICD-10-CM | POA: Diagnosis not present

## 2017-07-04 DIAGNOSIS — H548 Legal blindness, as defined in USA: Secondary | ICD-10-CM | POA: Diagnosis not present

## 2017-07-06 DIAGNOSIS — R278 Other lack of coordination: Secondary | ICD-10-CM | POA: Diagnosis not present

## 2017-07-06 DIAGNOSIS — H548 Legal blindness, as defined in USA: Secondary | ICD-10-CM | POA: Diagnosis not present

## 2017-07-06 DIAGNOSIS — H5316 Psychophysical visual disturbances: Secondary | ICD-10-CM | POA: Diagnosis not present

## 2017-07-06 DIAGNOSIS — N184 Chronic kidney disease, stage 4 (severe): Secondary | ICD-10-CM | POA: Diagnosis not present

## 2017-07-06 DIAGNOSIS — K219 Gastro-esophageal reflux disease without esophagitis: Secondary | ICD-10-CM | POA: Diagnosis not present

## 2017-07-08 DIAGNOSIS — K219 Gastro-esophageal reflux disease without esophagitis: Secondary | ICD-10-CM | POA: Diagnosis not present

## 2017-07-08 DIAGNOSIS — H548 Legal blindness, as defined in USA: Secondary | ICD-10-CM | POA: Diagnosis not present

## 2017-07-08 DIAGNOSIS — H5316 Psychophysical visual disturbances: Secondary | ICD-10-CM | POA: Diagnosis not present

## 2017-07-08 DIAGNOSIS — N184 Chronic kidney disease, stage 4 (severe): Secondary | ICD-10-CM | POA: Diagnosis not present

## 2017-07-08 DIAGNOSIS — R278 Other lack of coordination: Secondary | ICD-10-CM | POA: Diagnosis not present

## 2017-07-09 DIAGNOSIS — H548 Legal blindness, as defined in USA: Secondary | ICD-10-CM | POA: Diagnosis not present

## 2017-07-09 DIAGNOSIS — K219 Gastro-esophageal reflux disease without esophagitis: Secondary | ICD-10-CM | POA: Diagnosis not present

## 2017-07-09 DIAGNOSIS — R278 Other lack of coordination: Secondary | ICD-10-CM | POA: Diagnosis not present

## 2017-07-09 DIAGNOSIS — H5316 Psychophysical visual disturbances: Secondary | ICD-10-CM | POA: Diagnosis not present

## 2017-07-09 DIAGNOSIS — N184 Chronic kidney disease, stage 4 (severe): Secondary | ICD-10-CM | POA: Diagnosis not present

## 2017-07-10 DIAGNOSIS — E1122 Type 2 diabetes mellitus with diabetic chronic kidney disease: Secondary | ICD-10-CM | POA: Diagnosis not present

## 2017-07-10 DIAGNOSIS — N184 Chronic kidney disease, stage 4 (severe): Secondary | ICD-10-CM | POA: Diagnosis not present

## 2017-07-10 DIAGNOSIS — K219 Gastro-esophageal reflux disease without esophagitis: Secondary | ICD-10-CM | POA: Diagnosis not present

## 2017-07-10 DIAGNOSIS — H5316 Psychophysical visual disturbances: Secondary | ICD-10-CM | POA: Diagnosis not present

## 2017-07-10 DIAGNOSIS — H548 Legal blindness, as defined in USA: Secondary | ICD-10-CM | POA: Diagnosis not present

## 2017-07-10 DIAGNOSIS — I10 Essential (primary) hypertension: Secondary | ICD-10-CM | POA: Diagnosis not present

## 2017-07-10 DIAGNOSIS — E785 Hyperlipidemia, unspecified: Secondary | ICD-10-CM | POA: Diagnosis not present

## 2017-07-10 DIAGNOSIS — R278 Other lack of coordination: Secondary | ICD-10-CM | POA: Diagnosis not present

## 2017-07-12 DIAGNOSIS — H548 Legal blindness, as defined in USA: Secondary | ICD-10-CM | POA: Diagnosis not present

## 2017-07-12 DIAGNOSIS — R278 Other lack of coordination: Secondary | ICD-10-CM | POA: Diagnosis not present

## 2017-07-12 DIAGNOSIS — N184 Chronic kidney disease, stage 4 (severe): Secondary | ICD-10-CM | POA: Diagnosis not present

## 2017-07-12 DIAGNOSIS — H5316 Psychophysical visual disturbances: Secondary | ICD-10-CM | POA: Diagnosis not present

## 2017-07-12 DIAGNOSIS — K219 Gastro-esophageal reflux disease without esophagitis: Secondary | ICD-10-CM | POA: Diagnosis not present

## 2017-07-13 DIAGNOSIS — K219 Gastro-esophageal reflux disease without esophagitis: Secondary | ICD-10-CM | POA: Diagnosis not present

## 2017-07-13 DIAGNOSIS — H5316 Psychophysical visual disturbances: Secondary | ICD-10-CM | POA: Diagnosis not present

## 2017-07-13 DIAGNOSIS — R278 Other lack of coordination: Secondary | ICD-10-CM | POA: Diagnosis not present

## 2017-07-13 DIAGNOSIS — N184 Chronic kidney disease, stage 4 (severe): Secondary | ICD-10-CM | POA: Diagnosis not present

## 2017-07-13 DIAGNOSIS — H548 Legal blindness, as defined in USA: Secondary | ICD-10-CM | POA: Diagnosis not present

## 2017-07-15 DIAGNOSIS — R278 Other lack of coordination: Secondary | ICD-10-CM | POA: Diagnosis not present

## 2017-07-15 DIAGNOSIS — H5316 Psychophysical visual disturbances: Secondary | ICD-10-CM | POA: Diagnosis not present

## 2017-07-15 DIAGNOSIS — K219 Gastro-esophageal reflux disease without esophagitis: Secondary | ICD-10-CM | POA: Diagnosis not present

## 2017-07-15 DIAGNOSIS — H548 Legal blindness, as defined in USA: Secondary | ICD-10-CM | POA: Diagnosis not present

## 2017-07-15 DIAGNOSIS — N184 Chronic kidney disease, stage 4 (severe): Secondary | ICD-10-CM | POA: Diagnosis not present

## 2017-08-12 ENCOUNTER — Ambulatory Visit: Payer: Medicare HMO | Admitting: Internal Medicine

## 2017-08-12 DIAGNOSIS — Z0289 Encounter for other administrative examinations: Secondary | ICD-10-CM

## 2017-08-30 ENCOUNTER — Ambulatory Visit: Payer: Medicare HMO | Admitting: Internal Medicine

## 2017-12-30 ENCOUNTER — Ambulatory Visit: Payer: Medicaid Other | Admitting: Internal Medicine

## 2018-01-27 IMAGING — MR MR MRA HEAD W/O CM
1 series · 20 of 48 positions shown · non-contrast
Comparison: 05/15/2016

CLINICAL DATA: Acute presentation with balance disturbance and left
cerebellar infarction.

EXAM:
MRA HEAD WITHOUT CONTRAST
TECHNIQUE: Angiographic images of the Circle of Willis were obtained using MRA
technique without intravenous contrast.

[Series 3: ax (id) 2 · axial · 1.0mm · 0.43mm/px · z∈[-76,+11]mm · 20 of 182 slices shown]
[im 1/182]
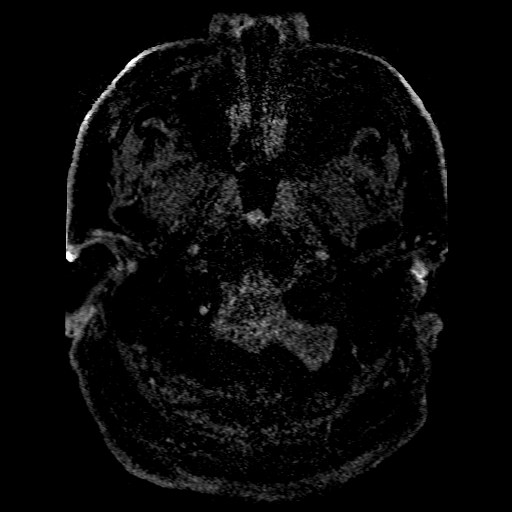
[im 4/182]
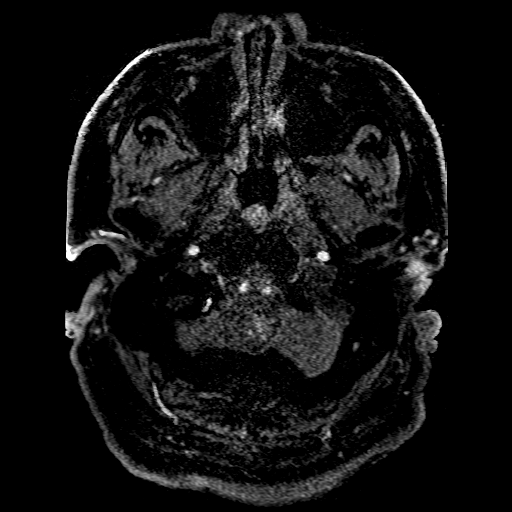
[im 8/182]
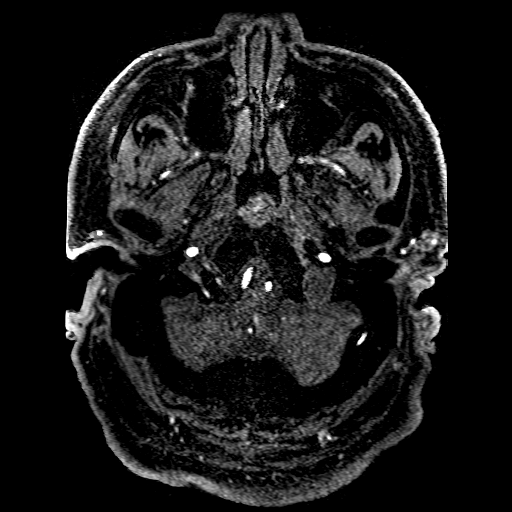
[im 12/182]
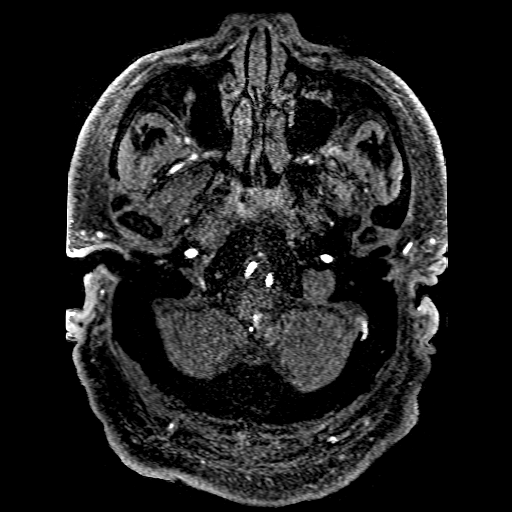
[im 16/182]
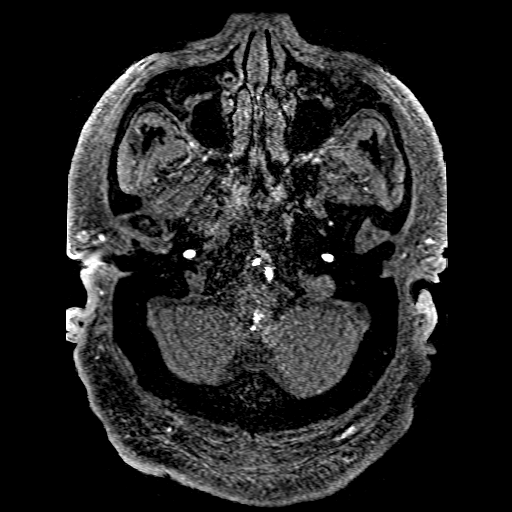
[im 20/182]
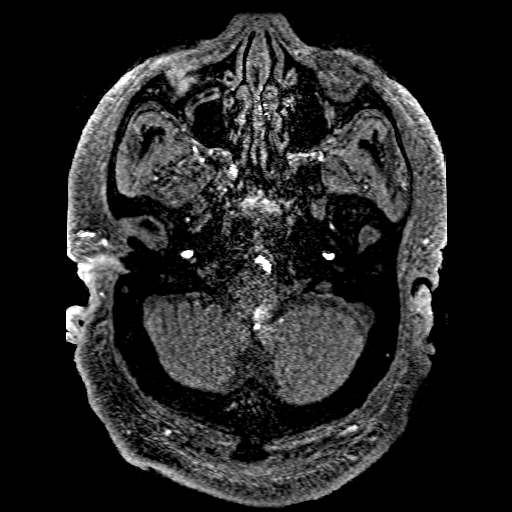
[im 24/182]
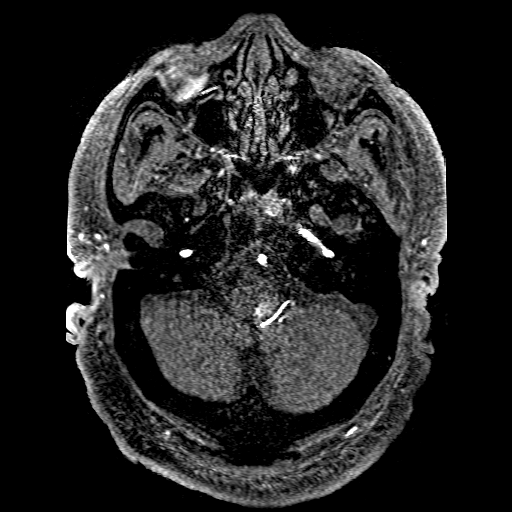
[im 27/182]
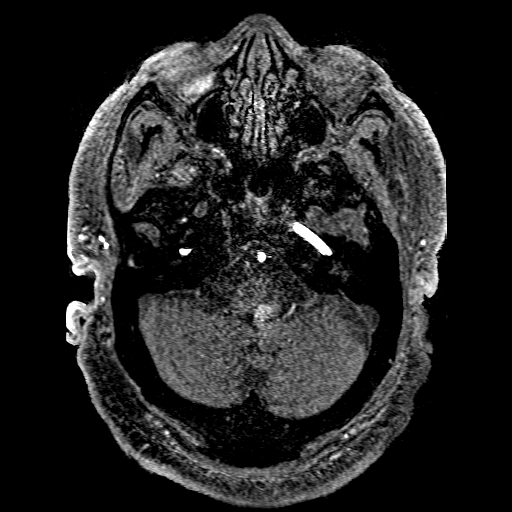
[im 31/182]
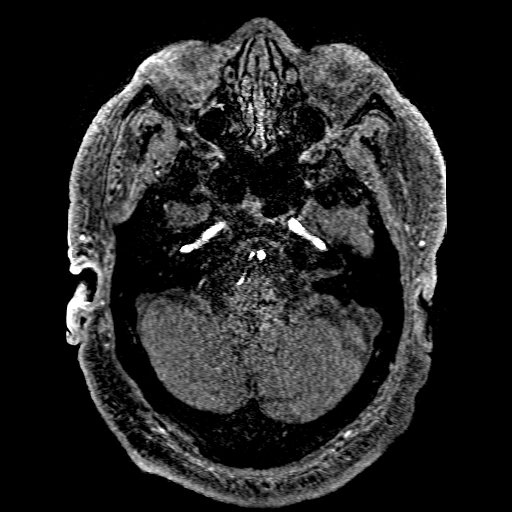
[im 35/182]
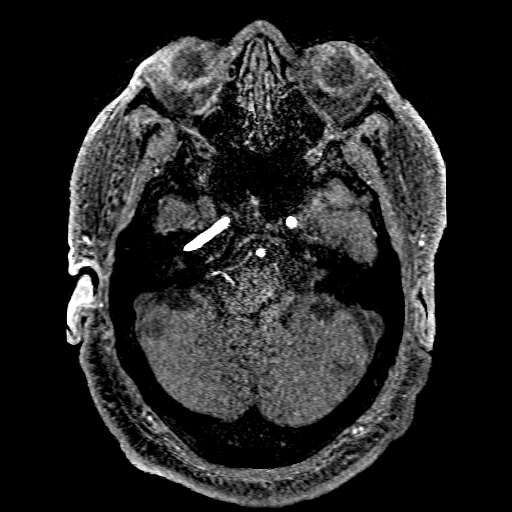
[im 39/182]
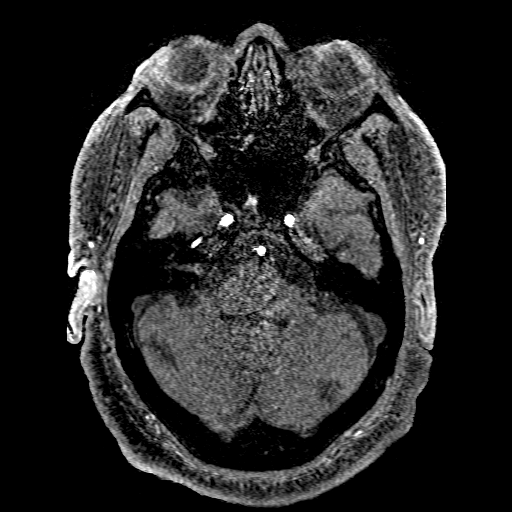
[im 43/182]
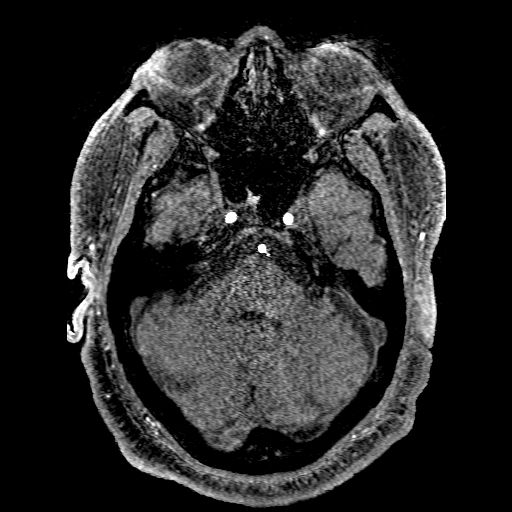
[im 58/182]
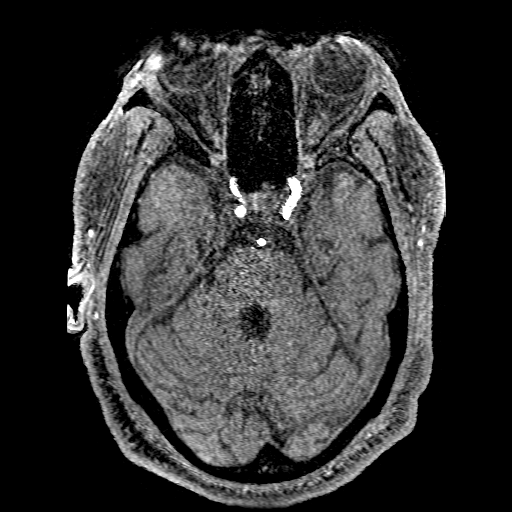
[im 81/182]
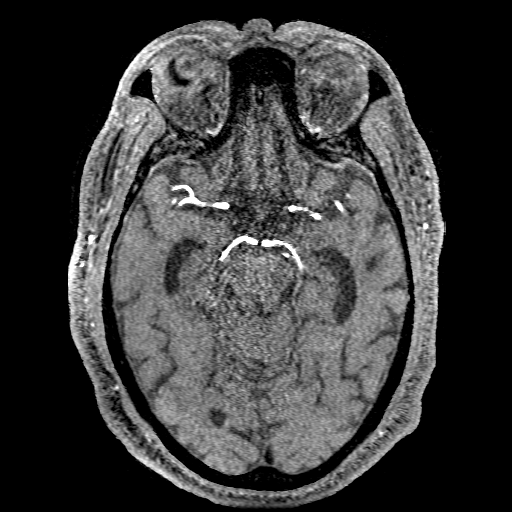
[im 93/182]
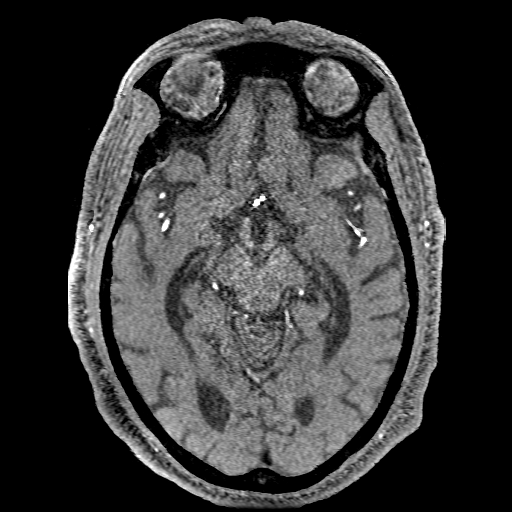
[im 104/182]
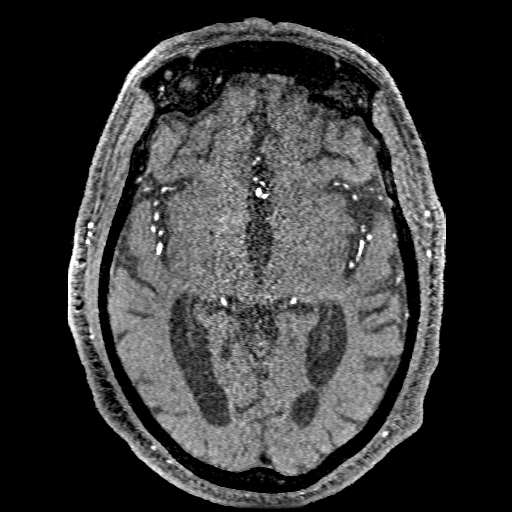
[im 128/182]
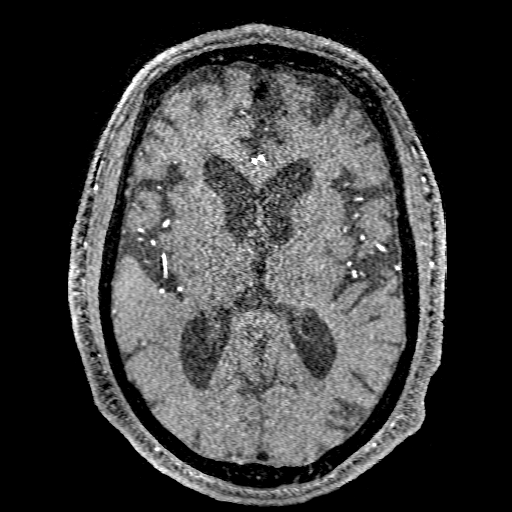
[im 151/182]
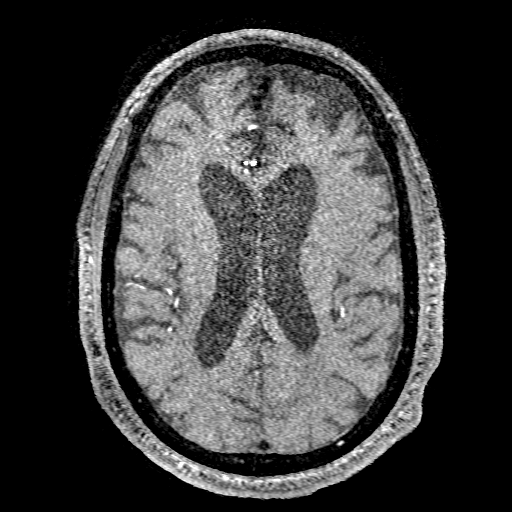
[im 155/182]
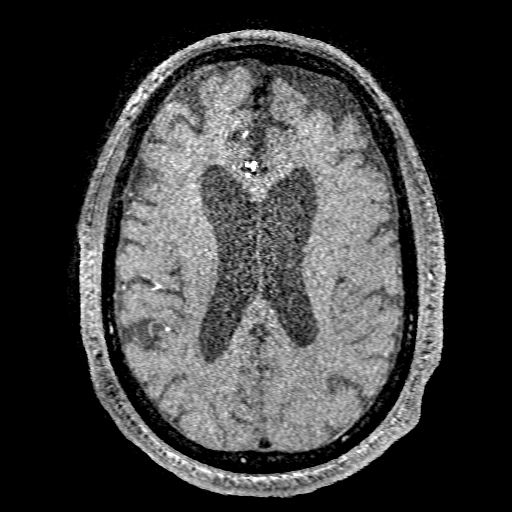
[im 174/182]
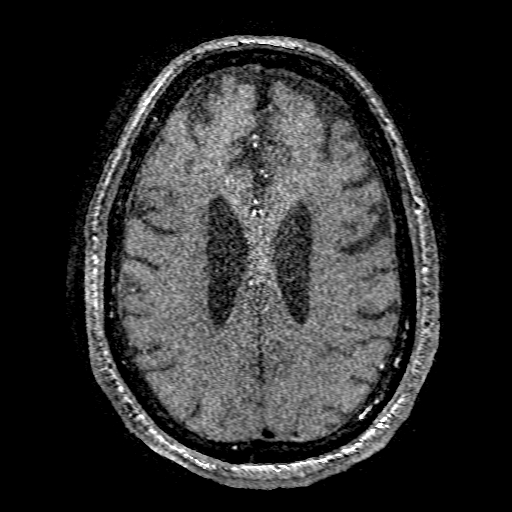

[20 of 48 positions shown; findings below may reference images not displayed]

FINDINGS: Both internal carotid arteries are widely patent into the brain. The
anterior and middle cerebral vessels are normal without proximal
stenosis, aneurysm or vascular malformation.

Both vertebral arteries are widely patent to the basilar. No basilar
stenosis. Dominant PICA, the origin of which is not included in the
region. Anterior inferior cerebellar arteries patent bilaterally,
larger on the right. Superior cerebellar and posterior cerebral
arteries patent and normal.
IMPRESSION: Within normal limits. No large or medium vessel stenosis or
occlusion.

## 2018-03-08 ENCOUNTER — Other Ambulatory Visit: Payer: Self-pay

## 2018-03-08 ENCOUNTER — Observation Stay (HOSPITAL_COMMUNITY)
Admission: EM | Admit: 2018-03-08 | Discharge: 2018-03-10 | Disposition: A | Discharge status: Skilled Nursing Facility | Payer: Medicare Other | Attending: Internal Medicine | Admitting: Internal Medicine

## 2018-03-08 ENCOUNTER — Emergency Department (HOSPITAL_COMMUNITY): Payer: Medicare Other

## 2018-03-08 ENCOUNTER — Encounter (HOSPITAL_COMMUNITY): Payer: Self-pay | Admitting: *Deleted

## 2018-03-08 DIAGNOSIS — E785 Hyperlipidemia, unspecified: Secondary | ICD-10-CM | POA: Insufficient documentation

## 2018-03-08 DIAGNOSIS — R0789 Other chest pain: Principal | ICD-10-CM | POA: Insufficient documentation

## 2018-03-08 DIAGNOSIS — I25810 Atherosclerosis of coronary artery bypass graft(s) without angina pectoris: Secondary | ICD-10-CM | POA: Clinically undetermined

## 2018-03-08 DIAGNOSIS — Z955 Presence of coronary angioplasty implant and graft: Secondary | ICD-10-CM | POA: Diagnosis not present

## 2018-03-08 DIAGNOSIS — E7800 Pure hypercholesterolemia, unspecified: Secondary | ICD-10-CM | POA: Diagnosis not present

## 2018-03-08 DIAGNOSIS — Z7902 Long term (current) use of antithrombotics/antiplatelets: Secondary | ICD-10-CM | POA: Diagnosis not present

## 2018-03-08 DIAGNOSIS — H548 Legal blindness, as defined in USA: Secondary | ICD-10-CM | POA: Insufficient documentation

## 2018-03-08 DIAGNOSIS — I252 Old myocardial infarction: Secondary | ICD-10-CM | POA: Insufficient documentation

## 2018-03-08 DIAGNOSIS — E11649 Type 2 diabetes mellitus with hypoglycemia without coma: Secondary | ICD-10-CM | POA: Insufficient documentation

## 2018-03-08 DIAGNOSIS — D631 Anemia in chronic kidney disease: Secondary | ICD-10-CM | POA: Diagnosis not present

## 2018-03-08 DIAGNOSIS — Z79899 Other long term (current) drug therapy: Secondary | ICD-10-CM | POA: Insufficient documentation

## 2018-03-08 DIAGNOSIS — Z7982 Long term (current) use of aspirin: Secondary | ICD-10-CM | POA: Insufficient documentation

## 2018-03-08 DIAGNOSIS — Z6832 Body mass index (BMI) 32.0-32.9, adult: Secondary | ICD-10-CM | POA: Diagnosis not present

## 2018-03-08 DIAGNOSIS — E669 Obesity, unspecified: Secondary | ICD-10-CM | POA: Insufficient documentation

## 2018-03-08 DIAGNOSIS — Z8673 Personal history of transient ischemic attack (TIA), and cerebral infarction without residual deficits: Secondary | ICD-10-CM | POA: Insufficient documentation

## 2018-03-08 DIAGNOSIS — I130 Hypertensive heart and chronic kidney disease with heart failure and stage 1 through stage 4 chronic kidney disease, or unspecified chronic kidney disease: Secondary | ICD-10-CM | POA: Insufficient documentation

## 2018-03-08 DIAGNOSIS — E119 Type 2 diabetes mellitus without complications: Secondary | ICD-10-CM

## 2018-03-08 DIAGNOSIS — N183 Chronic kidney disease, stage 3 unspecified: Secondary | ICD-10-CM | POA: Diagnosis present

## 2018-03-08 DIAGNOSIS — I25119 Atherosclerotic heart disease of native coronary artery with unspecified angina pectoris: Secondary | ICD-10-CM

## 2018-03-08 DIAGNOSIS — Z951 Presence of aortocoronary bypass graft: Secondary | ICD-10-CM | POA: Insufficient documentation

## 2018-03-08 DIAGNOSIS — G5600 Carpal tunnel syndrome, unspecified upper limb: Secondary | ICD-10-CM | POA: Insufficient documentation

## 2018-03-08 DIAGNOSIS — Z7984 Long term (current) use of oral hypoglycemic drugs: Secondary | ICD-10-CM | POA: Insufficient documentation

## 2018-03-08 DIAGNOSIS — Z66 Do not resuscitate: Secondary | ICD-10-CM | POA: Insufficient documentation

## 2018-03-08 DIAGNOSIS — K219 Gastro-esophageal reflux disease without esophagitis: Secondary | ICD-10-CM | POA: Diagnosis not present

## 2018-03-08 DIAGNOSIS — I10 Essential (primary) hypertension: Secondary | ICD-10-CM

## 2018-03-08 DIAGNOSIS — I5032 Chronic diastolic (congestive) heart failure: Secondary | ICD-10-CM | POA: Diagnosis not present

## 2018-03-08 DIAGNOSIS — E11319 Type 2 diabetes mellitus with unspecified diabetic retinopathy without macular edema: Secondary | ICD-10-CM | POA: Insufficient documentation

## 2018-03-08 DIAGNOSIS — E1142 Type 2 diabetes mellitus with diabetic polyneuropathy: Secondary | ICD-10-CM | POA: Diagnosis not present

## 2018-03-08 DIAGNOSIS — D638 Anemia in other chronic diseases classified elsewhere: Secondary | ICD-10-CM | POA: Diagnosis present

## 2018-03-08 DIAGNOSIS — E1122 Type 2 diabetes mellitus with diabetic chronic kidney disease: Secondary | ICD-10-CM | POA: Diagnosis not present

## 2018-03-08 DIAGNOSIS — R0602 Shortness of breath: Secondary | ICD-10-CM

## 2018-03-08 DIAGNOSIS — I2510 Atherosclerotic heart disease of native coronary artery without angina pectoris: Secondary | ICD-10-CM | POA: Insufficient documentation

## 2018-03-08 DIAGNOSIS — I5189 Other ill-defined heart diseases: Secondary | ICD-10-CM

## 2018-03-08 DIAGNOSIS — N184 Chronic kidney disease, stage 4 (severe): Secondary | ICD-10-CM

## 2018-03-08 LAB — CBC WITH DIFFERENTIAL/PLATELET
Abs Immature Granulocytes: 0.05 10*3/uL (ref 0.00–0.07)
Basophils Absolute: 0 10*3/uL (ref 0.0–0.1)
Basophils Relative: 0 %
Eosinophils Absolute: 0 10*3/uL (ref 0.0–0.5)
Eosinophils Relative: 0 %
HCT: 35.2 % — ABNORMAL LOW (ref 39.0–52.0)
Hemoglobin: 10.9 g/dL — ABNORMAL LOW (ref 13.0–17.0)
Immature Granulocytes: 0 %
Lymphocytes Relative: 10 %
Lymphs Abs: 1.1 10*3/uL (ref 0.7–4.0)
MCH: 27.5 pg (ref 26.0–34.0)
MCHC: 31 g/dL (ref 30.0–36.0)
MCV: 88.9 fL (ref 80.0–100.0)
Monocytes Absolute: 1 10*3/uL (ref 0.1–1.0)
Monocytes Relative: 9 %
NEUTROS PCT: 81 %
NRBC: 0 % (ref 0.0–0.2)
Neutro Abs: 9 10*3/uL — ABNORMAL HIGH (ref 1.7–7.7)
Platelets: 190 10*3/uL (ref 150–400)
RBC: 3.96 MIL/uL — ABNORMAL LOW (ref 4.22–5.81)
RDW: 14.2 % (ref 11.5–15.5)
WBC: 11.2 10*3/uL — ABNORMAL HIGH (ref 4.0–10.5)

## 2018-03-08 LAB — I-STAT TROPONIN, ED: Troponin i, poc: 0.01 ng/mL (ref 0.00–0.08)

## 2018-03-08 LAB — COMPREHENSIVE METABOLIC PANEL
ALK PHOS: 62 U/L (ref 38–126)
ALT: 17 U/L (ref 0–44)
AST: 17 U/L (ref 15–41)
Albumin: 2.8 g/dL — ABNORMAL LOW (ref 3.5–5.0)
Anion gap: 9 (ref 5–15)
BUN: 29 mg/dL — ABNORMAL HIGH (ref 8–23)
CO2: 20 mmol/L — ABNORMAL LOW (ref 22–32)
Calcium: 8.5 mg/dL — ABNORMAL LOW (ref 8.9–10.3)
Chloride: 106 mmol/L (ref 98–111)
Creatinine, Ser: 2.4 mg/dL — ABNORMAL HIGH (ref 0.61–1.24)
GFR calc Af Amer: 29 mL/min — ABNORMAL LOW (ref >60)
GFR calc non Af Amer: 25 mL/min — ABNORMAL LOW (ref >60)
Glucose, Bld: 166 mg/dL — ABNORMAL HIGH (ref 70–99)
Potassium: 4.7 mmol/L (ref 3.5–5.1)
SODIUM: 135 mmol/L (ref 135–145)
Total Bilirubin: 0.6 mg/dL (ref 0.3–1.2)
Total Protein: 7 g/dL (ref 6.5–8.1)

## 2018-03-08 LAB — CREATININE, SERUM
Creatinine, Ser: 2.32 mg/dL — ABNORMAL HIGH (ref 0.61–1.24)
GFR calc Af Amer: 30 mL/min — ABNORMAL LOW (ref >60)
GFR calc non Af Amer: 26 mL/min — ABNORMAL LOW (ref >60)

## 2018-03-08 LAB — CK: Total CK: 31 U/L — ABNORMAL LOW (ref 49–397)

## 2018-03-08 LAB — D-DIMER, QUANTITATIVE: D-Dimer, Quant: 1.54 ug/mL-FEU — ABNORMAL HIGH (ref 0.00–0.50)

## 2018-03-08 LAB — GLUCOSE, CAPILLARY: Glucose-Capillary: 119 mg/dL — ABNORMAL HIGH (ref 70–99)

## 2018-03-08 LAB — BRAIN NATRIURETIC PEPTIDE: B Natriuretic Peptide: 359.7 pg/mL — ABNORMAL HIGH (ref 0.0–100.0)

## 2018-03-08 LAB — TROPONIN I: Troponin I: <0.03 ng/mL (ref <0.03)

## 2018-03-08 LAB — LIPASE, BLOOD: Lipase: 22 U/L (ref 11–51)

## 2018-03-08 MED ORDER — ACETAMINOPHEN 325 MG PO TABS: 650.0000 mg | ORAL_TABLET | Freq: Three times a day (TID) | ORAL | Status: DC | PRN

## 2018-03-08 MED ORDER — LIDOCAINE VISCOUS HCL 2 % MT SOLN
15.0000 mL | Freq: Once | OROMUCOSAL | Status: AC
  Administered 2018-03-08: 15 mL via ORAL
  Filled 2018-03-08: qty 15

## 2018-03-08 MED ORDER — MORPHINE SULFATE (PF) 4 MG/ML IV SOLN
4.0000 mg | Freq: Once | INTRAVENOUS | Status: AC
  Administered 2018-03-08: 4 mg via INTRAVENOUS
  Filled 2018-03-08: qty 1

## 2018-03-08 MED ORDER — FLUOXETINE HCL 20 MG PO CAPS
20.0000 mg | ORAL_CAPSULE | Freq: Every day | ORAL | Status: DC
  Administered 2018-03-09 – 2018-03-10 (×2): 20 mg via ORAL
  Filled 2018-03-08 (×3): qty 1

## 2018-03-08 MED ORDER — HYDROCERIN EX CREA
1.0000 "application " | TOPICAL_CREAM | Freq: Two times a day (BID) | CUTANEOUS | Status: DC | PRN
  Filled 2018-03-08: qty 113

## 2018-03-08 MED ORDER — PANTOPRAZOLE SODIUM 40 MG PO TBEC
40.0000 mg | DELAYED_RELEASE_TABLET | Freq: Every day | ORAL | Status: DC
  Administered 2018-03-09 – 2018-03-10 (×2): 40 mg via ORAL
  Filled 2018-03-08 (×2): qty 1

## 2018-03-08 MED ORDER — FAMOTIDINE 20 MG PO TABS
20.0000 mg | ORAL_TABLET | Freq: Every day | ORAL | Status: DC
  Administered 2018-03-08 – 2018-03-10 (×3): 20 mg via ORAL
  Filled 2018-03-08 (×3): qty 1

## 2018-03-08 MED ORDER — ATORVASTATIN CALCIUM 10 MG PO TABS
10.0000 mg | ORAL_TABLET | Freq: Every day | ORAL | Status: DC
  Administered 2018-03-09 – 2018-03-10 (×2): 10 mg via ORAL
  Filled 2018-03-08 (×2): qty 1

## 2018-03-08 MED ORDER — CYCLOSPORINE 0.05 % OP EMUL
1.0000 [drp] | Freq: Two times a day (BID) | OPHTHALMIC | Status: DC
  Administered 2018-03-08 – 2018-03-10 (×4): 1 [drp] via OPHTHALMIC
  Filled 2018-03-08 (×4): qty 30

## 2018-03-08 MED ORDER — HYDRALAZINE HCL 25 MG PO TABS
25.0000 mg | ORAL_TABLET | Freq: Three times a day (TID) | ORAL | Status: DC
  Administered 2018-03-08 – 2018-03-10 (×5): 25 mg via ORAL
  Filled 2018-03-08 (×6): qty 1

## 2018-03-08 MED ORDER — POLYETHYLENE GLYCOL 3350 17 G PO PACK: 17.0000 g | PACK | Freq: Every day | ORAL | Status: DC | PRN

## 2018-03-08 MED ORDER — HYDRALAZINE HCL 20 MG/ML IJ SOLN: 10.0000 mg | Freq: Four times a day (QID) | INTRAMUSCULAR | Status: DC | PRN

## 2018-03-08 MED ORDER — LOSARTAN POTASSIUM 50 MG PO TABS
100.0000 mg | ORAL_TABLET | Freq: Every day | ORAL | Status: DC
  Administered 2018-03-09 – 2018-03-10 (×2): 100 mg via ORAL
  Filled 2018-03-08 (×2): qty 2

## 2018-03-08 MED ORDER — REPAGLINIDE 1 MG PO TABS
0.5000 mg | ORAL_TABLET | Freq: Three times a day (TID) | ORAL | Status: DC
  Administered 2018-03-09 – 2018-03-10 (×4): 0.5 mg via ORAL
  Filled 2018-03-08 (×5): qty 0.5

## 2018-03-08 MED ORDER — POLYVINYL ALCOHOL 1.4 % OP SOLN
1.0000 [drp] | Freq: Four times a day (QID) | OPHTHALMIC | Status: DC
  Administered 2018-03-08 – 2018-03-09 (×2): 2 [drp] via OPHTHALMIC
  Administered 2018-03-09: 1 [drp] via OPHTHALMIC
  Administered 2018-03-09 – 2018-03-10 (×3): 2 [drp] via OPHTHALMIC
  Filled 2018-03-08: qty 15

## 2018-03-08 MED ORDER — DOCUSATE SODIUM 100 MG PO CAPS
100.0000 mg | ORAL_CAPSULE | Freq: Two times a day (BID) | ORAL | Status: DC
  Administered 2018-03-08 – 2018-03-10 (×4): 100 mg via ORAL
  Filled 2018-03-08 (×4): qty 1

## 2018-03-08 MED ORDER — ENOXAPARIN SODIUM 40 MG/0.4ML ~~LOC~~ SOLN
40.0000 mg | SUBCUTANEOUS | Status: DC
  Administered 2018-03-08 – 2018-03-09 (×2): 40 mg via SUBCUTANEOUS
  Filled 2018-03-08 (×2): qty 0.4

## 2018-03-08 MED ORDER — ACETAMINOPHEN 500 MG PO TABS
500.0000 mg | ORAL_TABLET | Freq: Two times a day (BID) | ORAL | Status: DC
  Administered 2018-03-08 – 2018-03-10 (×4): 500 mg via ORAL
  Filled 2018-03-08 (×4): qty 1

## 2018-03-08 MED ORDER — GABAPENTIN 100 MG PO CAPS
100.0000 mg | ORAL_CAPSULE | Freq: Three times a day (TID) | ORAL | Status: DC
  Administered 2018-03-08 – 2018-03-10 (×5): 100 mg via ORAL
  Filled 2018-03-08 (×5): qty 1

## 2018-03-08 MED ORDER — ONDANSETRON HCL 4 MG PO TABS: 4.0000 mg | ORAL_TABLET | Freq: Four times a day (QID) | ORAL | Status: DC | PRN

## 2018-03-08 MED ORDER — ISOSORBIDE MONONITRATE ER 30 MG PO TB24
30.0000 mg | ORAL_TABLET | Freq: Every day | ORAL | Status: DC
  Administered 2018-03-09 – 2018-03-10 (×2): 30 mg via ORAL
  Filled 2018-03-08 (×2): qty 1

## 2018-03-08 MED ORDER — FINASTERIDE 5 MG PO TABS
5.0000 mg | ORAL_TABLET | Freq: Every day | ORAL | Status: DC
  Administered 2018-03-09 – 2018-03-10 (×2): 5 mg via ORAL
  Filled 2018-03-08 (×2): qty 1

## 2018-03-08 MED ORDER — INSULIN ASPART 100 UNIT/ML ~~LOC~~ SOLN: 0.0000 [IU] | Freq: Three times a day (TID) | SUBCUTANEOUS | Status: DC

## 2018-03-08 MED ORDER — MORPHINE SULFATE (PF) 2 MG/ML IV SOLN: 2.0000 mg | INTRAVENOUS | Status: DC | PRN

## 2018-03-08 MED ORDER — OXYCODONE HCL 5 MG PO TABS: 5.0000 mg | ORAL_TABLET | ORAL | Status: DC | PRN

## 2018-03-08 MED ORDER — ASPIRIN 81 MG PO CHEW
324.0000 mg | CHEWABLE_TABLET | Freq: Once | ORAL | Status: AC
  Administered 2018-03-08: 324 mg via ORAL
  Filled 2018-03-08: qty 4

## 2018-03-08 MED ORDER — VITAMIN D3 25 MCG (1000 UNIT) PO TABS
1000.0000 [IU] | ORAL_TABLET | Freq: Every day | ORAL | Status: DC
  Administered 2018-03-09 – 2018-03-10 (×2): 1000 [IU] via ORAL
  Filled 2018-03-08 (×4): qty 1

## 2018-03-08 MED ORDER — METOPROLOL SUCCINATE ER 100 MG PO TB24
100.0000 mg | ORAL_TABLET | Freq: Every day | ORAL | Status: DC
  Administered 2018-03-09 – 2018-03-10 (×2): 100 mg via ORAL
  Filled 2018-03-08 (×2): qty 1

## 2018-03-08 MED ORDER — ONDANSETRON HCL 4 MG/2ML IJ SOLN: 4.0000 mg | Freq: Four times a day (QID) | INTRAMUSCULAR | Status: DC | PRN

## 2018-03-08 MED ORDER — ALUM & MAG HYDROXIDE-SIMETH 200-200-20 MG/5ML PO SUSP
30.0000 mL | Freq: Once | ORAL | Status: AC
  Administered 2018-03-08: 30 mL via ORAL
  Filled 2018-03-08: qty 30

## 2018-03-08 MED ORDER — CLOPIDOGREL BISULFATE 75 MG PO TABS
75.0000 mg | ORAL_TABLET | Freq: Every day | ORAL | Status: DC
  Administered 2018-03-09 – 2018-03-10 (×2): 75 mg via ORAL
  Filled 2018-03-08 (×2): qty 1

## 2018-03-08 MED ORDER — NITROGLYCERIN 0.4 MG SL SUBL: 0.4000 mg | SUBLINGUAL_TABLET | SUBLINGUAL | Status: DC | PRN

## 2018-03-08 MED ORDER — ASPIRIN EC 81 MG PO TBEC
81.0000 mg | DELAYED_RELEASE_TABLET | Freq: Every day | ORAL | Status: DC
  Administered 2018-03-09 – 2018-03-10 (×2): 81 mg via ORAL
  Filled 2018-03-08 (×2): qty 1

## 2018-03-08 NOTE — ED Triage Notes (Signed)
PT trans ported from [place] via EMS for CP since Tuesday. Today Pt had CP and received 2 NGT from [facility] that  Relieved the CP. Pt denies any cough .

## 2018-03-08 NOTE — ED Triage Notes (Signed)
Contact numbers for PT. Brother Caleb Dunn [phone]  Sister  [phone]

## 2018-03-08 NOTE — H&P (Signed)
History and Physical    DOA: 03/08/2018  PCP: Cassandria Anger, MD  Patient coming from: [facility]  Chief Complaint: Chest pain  HPI: Caleb Blakeman Sr. is a 78 y.o. 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  Patient states he has ataxia at baseline and has had multiple falls.  The last fall was about a month back where he held onto the wall in the bathroom.  He denies any chest trauma.  He has been experiencing episodes of chest pain for the last 4 to 5 days, described as sharp, 5/10, left-sided worse with position changes and on taking deep breath.  Per patient episodes usually last few seconds.  He states he had similar episode this morning and received nitro at the facility with no relief. He states chest pain resolved after he received medications in the ED here (GI cocktail, aspirin 325 mg and morphine). Currently he is chest pain-free even when he takes deep breath.  He denies any recent upper respiratory illness.  He is on maximum antiplatelet therapy with aspirin and Plavix at baseline.  He states he usually follows [facility] and he believes his last stress test was about a year back.  Last echo in our record here from 2018 showed stage I diastolic dysfunction.  He denies any recent leg swellings or weight gain or shortness of breath.  Document sent from assisted living facility included DNR form and patient confirmed the code status.  Work-up in the ED including EKG, troponin unremarkable.  Other labs show stable creatinine around 2.4, hemoglobin at 10.9, BNP at 359.  Chest x-ray unremarkable.  Patient is requested to be admitted for further evaluation and management.   Review of Systems: As per HPI otherwise 10 point  review of systems negative.    Past Medical History:  Diagnosis Date  . Blind in both eyes   . CAD (coronary artery disease)   . Cholelithiasis   . CTS (carpal tunnel syndrome)    Left  . CVA (cerebral vascular accident) [facility]) ~ 02/2016   "dr says my left side is weaker now; I can'r tell" (10/11/2016)  . DM type 2 with diabetic peripheral neuropathy ([place])   . ED (erectile dysfunction)   . Elevated PSA   . GERD (gastroesophageal reflux disease)   . Glaucoma, both eyes   . High cholesterol   . HTN (hypertension)   . Inferior MI ([place])    hx/notes 06/06/2010  . Legal blindness due to diabetes mellitus ([place])   . MI (myocardial infarction) ([place]) 1991  . Thyroid nodule   . Type II diabetes mellitus ([place])     Past Surgical History:  Procedure Laterality Date  . CORONARY ANGIOPLASTY WITH STENT PLACEMENT  04/26/2000   Archie Endo 06/06/2010  . CORONARY ARTERY BYPASS GRAFT  2002   Archie Endo 06/06/2010  . EYE SURGERY Bilateral    "did laser on them"  . GLAUCOMA SURGERY Bilateral    several/notes 06/06/2010  . LAPAROSCOPIC CHOLECYSTECTOMY  10/2004   Archie Endo 06/06/2010  . NECK MASS EXCISION Left    "benign"  . VITRECTOMY Right 05/01/2004   Archie Endo 06/06/2010    Social history:  reports that he quit smoking about 29 years ago. His smoking use included cigarettes. He has a 70.00 pack-year smoking history.  He has never used smokeless tobacco. He reports that he does not drink alcohol or use drugs.   Allergies  Allergen Reactions  . Hydroxyzine Other (See Comments)    Visual hallucinations  . Verapamil Other (See Comments)    Hallucinations   . Benazepril Hcl Cough    Family History  Problem Relation Age of Onset  . Hypertension Mother   . Kidney disease Father   . Hypertension Father   . Coronary artery disease Other        1st degree Male relative <60  . Diabetes Other        1st degree relative      Prior to Admission medications   Medication Sig Start Date End Date Taking?  Authorizing Provider  acetaminophen (TYLENOL) 325 MG tablet Take 650 mg by mouth every 8 (eight) hours as needed for mild pain, fever or headache.   Yes [provider]  acetaminophen (TYLENOL) 500 MG tablet Take 500 mg by mouth 2 (two) times daily.   Yes [provider]  aspirin 81 MG tablet Take 1 tablet (81 mg total) by mouth daily. 10/12/16  Yes Barton Dubois, MD  atorvastatin (LIPITOR) 10 MG tablet Take 10 mg by mouth daily.   Yes [provider]  carboxymethylcellulose 1 % ophthalmic solution Place 1-2 drops into both eyes 4 (four) times daily.   Yes [provider]  cholecalciferol (VITAMIN D) 1000 units tablet Take 1 tablet (1,000 Units total) by mouth daily. 03/29/16  Yes Angiulli, Lavon Paganini, PA-C  clopidogrel (PLAVIX) 75 MG tablet Take 1 tablet (75 mg total) by mouth daily. 06/06/16  Yes Plotnikov, Evie Lacks, MD  cycloSPORINE (RESTASIS) 0.05 % ophthalmic emulsion Place 1 drop into both eyes 2 (two) times daily.   Yes [provider]  docusate sodium (COLACE) 100 MG capsule Take 1 capsule (100 mg total) by mouth 2 (two) times daily. 11/01/16  Yes Nche, Charlene Brooke, NP  finasteride (PROSCAR) 5 MG tablet Take 1 tablet (5 mg total) by mouth daily. For Prostate 03/29/16  Yes Angiulli, Lavon Paganini, PA-C  FLUoxetine (PROZAC) 20 MG tablet Take 1 tablet (20 mg total) by mouth daily. 09/10/16  Yes Plotnikov, Evie Lacks, MD  gabapentin (NEURONTIN) 100 MG capsule Take 1 capsule (100 mg total) 3 (three) times daily by mouth. For pain 11/30/16  Yes Plotnikov, Evie Lacks, MD  hydrALAZINE (APRESOLINE) 10 MG tablet Take 1 tablet (10 mg total) by mouth every 8 (eight) hours. Patient taking differently: Take 20 mg by mouth every 8 (eight) hours.  06/06/16  Yes Plotnikov, Evie Lacks, MD  isosorbide mononitrate (IMDUR) 30 MG 24 hr tablet Take 1 tablet (30 mg total) by mouth daily. 10/13/16  Yes Barton Dubois, MD  losartan (COZAAR) 100 MG tablet Take 100 mg by mouth daily.   Yes  [provider]  Menthol, Topical Analgesic, (BIOFREEZE) 4 % GEL Apply 1 application topically every 2 (two) hours as needed (pain or soreness).   Yes [provider]  metoprolol succinate (TOPROL-XL) 100 MG 24 hr tablet Take 1 tablet (100 mg total) by mouth daily. Take with or immediately following a meal. 10/13/16  Yes Barton Dubois, MD  nitroGLYCERIN (NITROSTAT) 0.4 MG SL tablet Place 0.4 mg under the tongue every 5 (five) minutes as needed for chest pain.   Yes [provider]  pantoprazole (PROTONIX) 40 MG tablet Take 1 tablet (40 mg total) by mouth daily. 10/19/16  Yes Nche, Charlene Brooke, NP  ranitidine (ZANTAC) 150  MG tablet Take 1 tablet (150 mg total) by mouth at bedtime. 10/12/16 03/08/18 Yes Barton Dubois, MD  repaglinide (PRANDIN) 1 MG tablet Take 1 tablet (1 mg total) by mouth 3 (three) times daily before meals. Patient taking differently: Take 0.5 mg by mouth 3 (three) times daily before meals.  06/13/16  Yes Plotnikov, Evie Lacks, MD  Skin Protectants, Misc. (EUCERIN) cream Apply 1 application topically 2 (two) times daily as needed for dry skin.   Yes [provider]  ACCU-CHEK SOFTCLIX LANCETS lancets Used to check blood sugar BID. KC:L27.517 05/08/17   Plotnikov, Evie Lacks, MD  Blood Glucose Monitoring Suppl (ACCU-CHEK AVIVA PLUS) w/Device KIT Used to check blood sugar BID. GY:F74.944 05/16/17   Plotnikov, Evie Lacks, MD  glucose blood (ACCU-CHEK SMARTVIEW) test strip Used to check blood sugar BID. HQ:P59.163 05/08/17   Plotnikov, Evie Lacks, MD  lovastatin (MEVACOR) 20 MG tablet Take 1 tablet (20 mg total) by mouth daily. For Cholesterol Patient not taking: Reported on 03/08/2018 03/29/16   Cathlyn Parsons, PA-C    Physical Exam: Vitals:   03/08/18 1615 03/08/18 1619 03/08/18 1630 03/08/18 1845  BP: (!) 177/76  (!) 179/73 106/85  Pulse: 76     Resp: '17  18 17  ' Temp:  98.6 F (37 C)    TempSrc:  Oral    SpO2: 99%     Weight:      Height:         Constitutional: NAD, calm, comfortable Eyes: Legally blind ENMT: Mucous membranes are moist. Posterior pharynx clear of any exudate or lesions.Normal dentition.  Neck: normal, supple, no masses, no thyromegaly Respiratory: clear to auscultation bilaterally, no wheezing, no crackles. Normal respiratory effort. No accessory muscle use.  Cardiovascular: No reproducible chest pain along costochondral junctions or left anterior chest wall.  Regular rate and rhythm, no murmurs / rubs / gallops. No extremity edema. 2+ pedal pulses. No carotid bruits.  Abdomen: no tenderness, no masses palpated. No hepatosplenomegaly. Bowel sounds positive.  Musculoskeletal: no clubbing / cyanosis. No joint deformity upper and lower extremities. Good ROM, no contractures. Normal muscle tone.  Neurologic: CN 2-12 grossly intact. Sensation intact, DTR normal. Strength 4/5 in all 4.  Psychiatric: Normal judgment and insight. Alert and oriented x 3. Normal mood.  SKIN/catheters: no rashes, lesions, ulcers. No induration  Labs on Admission: I have personally reviewed following labs and imaging studies  CBC: Recent Labs  Lab 03/08/18 1649  WBC 11.2*  NEUTROABS 9.0*  HGB 10.9*  HCT 35.2*  MCV 88.9  PLT 846   Basic Metabolic Panel: Recent Labs  Lab 03/08/18 1649  NA 135  K 4.7  CL 106  CO2 20*  GLUCOSE 166*  BUN 29*  CREATININE 2.40*  CALCIUM 8.5*   GFR: Estimated Creatinine Clearance: 25.7 mL/min (A) (by C-G formula based on SCr of 2.4 mg/dL (H)). Liver Function Tests: Recent Labs  Lab 03/08/18 1649  AST 17  ALT 17  ALKPHOS 62  BILITOT 0.6  PROT 7.0  ALBUMIN 2.8*   Recent Labs  Lab 03/08/18 1649  LIPASE 22   No results for input(s): AMMONIA in the last 168 hours. Coagulation Profile: No results for input(s): INR, PROTIME in the last 168 hours. Cardiac Enzymes: No results for input(s): CKTOTAL, CKMB, CKMBINDEX, TROPONINI in the last 168 hours. BNP (last 3 results) No results for  input(s): PROBNP in the last 8760 hours. HbA1C: No results for input(s): HGBA1C in the last 72 hours. CBG: No results for  input(s): GLUCAP in the last 168 hours. Lipid Profile: No results for input(s): CHOL, HDL, LDLCALC, TRIG, CHOLHDL, LDLDIRECT in the last 72 hours. Thyroid Function Tests: No results for input(s): TSH, T4TOTAL, FREET4, T3FREE, THYROIDAB in the last 72 hours. Anemia Panel: No results for input(s): VITAMINB12, FOLATE, FERRITIN, TIBC, IRON, RETICCTPCT in the last 72 hours. Urine analysis:    Component Value Date/Time   COLORURINE YELLOW 11/11/2016 [place] 11/11/2016 1258   LABSPEC 1.025 11/11/2016 1258   PHURINE 5.0 11/11/2016 [place] 11/11/2016 1258   GLUCOSEU NEGATIVE 11/01/2015 1709   HGBUR SMALL (A) 11/11/2016 1258   BILIRUBINUR NEGATIVE 11/11/2016 1258   KETONESUR 5 (A) 11/11/2016 1258   PROTEINUR >=300 (A) 11/11/2016 1258   UROBILINOGEN 0.2 11/01/2015 1709   NITRITE NEGATIVE 11/11/2016 [place] 11/11/2016 1258    Radiological Exams on Admission: Dg Chest 2 View  Result Date: 03/08/2018 CLINICAL DATA:  One-week history of LEFT-sided chest pain and shortness of breath. EXAM: CHEST - 2 VIEW COMPARISON:  10/11/2016 and earlier. FINDINGS: AP SEMI-ERECT and LATERAL images were obtained. Prior sternotomy for CABG. Cardiac silhouette upper normal in size for AP technique. Hilar and mediastinal contours unremarkable. Lungs clear. Bronchovascular markings normal. Pulmonary vascularity normal. No visible pleural effusions. No pneumothorax. Degenerative changes involving the thoracic and upper lumbar spine. IMPRESSION: No acute cardiopulmonary disease. Electronically Signed   By: Evangeline Dakin M.D.   On: 03/08/2018 17:31    EKG: Independently reviewed.  Normal sinus rhythm.  T wave inversion in lead I, aVL and biphasic T waves in anterior/lateral leads.     Assessment and Plan:   1.  Atypical chest pain:  Given patient's underlying cardiac risk factors, will admit for observation and cycle cardiac enzymes.  Will obtain echo to rule out wall motion abnormalities.  If echo normal, can likely follow-up primary cardiology as outpatient.  Given positional/pleuritic component with history of recurrent falls and resolution with pain medications in the ED, suspect musculoskeletal in origin.  Currently chest pain-free.  Resume home medications including scheduled/PRN acetaminophen and Neurontin.  Nitro PRN available.  Resume aspirin Plavix, nitro and beta-blockers.Given pleuritic component d-dimer added to labs.  If significantly elevated, can consider VQ scan although low suspicion with no complaints of shortness of breath and saturating well on room air.  CK level added given recurrent falls and positional component.  2.  CAD, MI status post CABG: Resume aspirin, Plavix, Imdur, beta-blockers and ACE inhibitors  3.  Stage I diastolic dysfunction/chronic CHF: Appears euvolemic.  Not on diuretics.  Resume home medications  4.  History of CVA: Resume aspirin and Plavix  5.  Diabetes mellitus: Resume Prandin and sliding scale insulin.  6.  Diabetic retinopathy/blindness and neuropathy: Resume oral hypoglycemics and Neurontin  7.  Chronic kidney disease stage IV: Appears stable and creatinine at baseline.  Resume home medications  8.  Hypertension:Increase hydralazine dosage and additional dosing PRN as blood pressure elevated.  9. Hyperlipidemia: Lipid profile in a.m.  Resume statins  DVT prophylaxis: Lovenox  Code Status: DNR as confirmed with patient and nursing home records  Family Communication: Discussed with patient. Health care proxy would be his daughter [name] and son Denna Haggard called: None Admission status:  Patient admitted as observation as anticipated LOS < than 2 midnights.  Can return to assisted living in a.m. if work-up negative.   Guilford Shi MD [facility] Pager [phone]  If 7PM-7AM, please contact night-coverage  [email or web address] Password [email or web address]  03/08/2018, 7:30 PM

## 2018-03-08 NOTE — ED Provider Notes (Signed)
[facility] EMERGENCY DEPARTMENT Provider Note   CSN: [id number] Arrival date & time: 03/08/18  1544     History   Chief Complaint Chief Complaint  Patient presents with  . Chest Pain    HPI Caleb Caniglia Sr. is a 78 y.o. male with a PMHx of CAD/MI s/p PCI and CABG, dCHF (EF 55% in 02/2016), blindness, DM2, HTN, HLD, GERD, CVA, CKD4, and other conditions listed below, who presents to the ED with complaints of intermittent chest pain that began Tuesday night when he was laying in bed and has been intermittent since then.  He describes the pain as 8/10 intermittent sharp left-sided chest pain that radiates into the left upper quadrant, lasting a few seconds, worse with inspiration, improved with laying still, and unrelieved with nitroglycerin.  He has not taken anything else specifically for it.  He states that he doesn't have any current CP unless he takes a breath.  He reports some mild shortness of breath as well as generalized weakness/fatigue.  He does not remember if this feels anything like his prior MI.  His cardiologist is Dr. Tollie Eth.  His PCP is Dr. Alain Marion.  He quit smoking 30 years ago and is a current non-smoker.  He denies any lightheadedness, diaphoresis, fevers, chills, cough, leg swelling, recent travel/surgery/immobilization, personal or family history of DVT/PE, abdominal pain, nausea, vomiting, diarrhea, constipation, dysuria, hematuria, numbness, tingling, focal weakness, claudication, orthopnea, or any other complaints at this time.  The history is provided by the patient and medical records. No language interpreter was used.  Chest Pain  Associated symptoms: fatigue and shortness of breath   Associated symptoms: no abdominal pain, no cough, no diaphoresis, no fever, no nausea, no numbness, no vomiting and no weakness     Past Medical History:  Diagnosis Date  . Blind in both eyes   . CAD (coronary artery disease)   . Cholelithiasis   .  CTS (carpal tunnel syndrome)    Left  . CVA (cerebral vascular accident) [facility]) ~ 02/2016   "dr says my left side is weaker now; I can'r tell" (10/11/2016)  . DM type 2 with diabetic peripheral neuropathy ([place])   . ED (erectile dysfunction)   . Elevated PSA   . GERD (gastroesophageal reflux disease)   . Glaucoma, both eyes   . High cholesterol   . HTN (hypertension)   . 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 Legal blindness due to diabetes mellitus ([place])   . MI (myocardial infarction) ([place]) 1991  . Thyroid nodule   . Type II diabetes mellitus [facility])     Patient Active Problem List   Diagnosis Date Noted  . Scrotum pain 11/30/2016  . Diabetes mellitus with complication ([place])   . Groin pain, right 09/04/2016  . Fall   . History of stroke   . AKI (acute kidney injury) ([place])   . Type 2 diabetes mellitus with peripheral neuropathy (H[facility])   . Labile blood pressure   . Essential hypertension   . Blind   . 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 Acute cerebrovascular accident (CVA) ([place]) 03/13/2016  . Pruritus 01/03/2016  . Impacted cerumen of left ear 10/03/2015  . Situational mixed anxiety and depressive disorder 06/17/2015  . Neuropathic pain 03/09/2015  . Insomnia 03/09/2015  . Left arm weakness 08/13/2014  . Herpes zoster 07/13/2014  . Constipation 07/13/2014  . Epididymitis 02/23/2014  . Pain in joint, ankle and foot 08/04/2013  . Dry  mouth 05/05/2013  . 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 Obesity (BMI 30-39.9)   . Chronic kidney disease (CKD), stage IV (severe) ([place])   . Elevated PSA 08/17/2011  . Bladder neck obstruction 04/13/2011  . DM (diabetes mellitus), type 2 with ophthalmic complications ([place])   . Glaucoma associated with ocular disorder   . Carpal tunnel sundrome   . Erectile dysfunction   . GERD   . Cervical disc disorder with radiculopathy   . Hyperlipidemia   . Cholelithiases   . Hypertensive heart disease   . Coronary atherosclerosis      Past Surgical History:  Procedure Laterality Date  . CORONARY ANGIOPLASTY WITH STENT PLACEMENT  04/26/2000   Archie Endo 06/06/2010  . CORONARY ARTERY BYPASS GRAFT  2002   Archie Endo 06/06/2010  . EYE SURGERY Bilateral    "did laser on them"  . GLAUCOMA SURGERY Bilateral    several/notes 06/06/2010  . LAPAROSCOPIC CHOLECYSTECTOMY  10/2004   Archie Endo 06/06/2010  . NECK MASS EXCISION Left    "benign"  . VITRECTOMY Right 05/01/2004   Archie Endo 06/06/2010        Home Medications    Prior to Admission medications   Medication Sig Start Date End Date Taking? Authorizing Provider  ACCU-CHEK SOFTCLIX LANCETS lancets Used to check blood sugar BID. PQ:Z30.076 05/08/17   Plotnikov, Evie Lacks, MD  aspirin 81 MG tablet Take 1 tablet (81 mg total) by mouth daily. 10/12/16   Barton Dubois, MD  Blood Glucose Monitoring Suppl (ACCU-CHEK AVIVA PLUS) w/Device KIT Used to check blood sugar BID. AU:Q33.354 05/16/17   Plotnikov, Evie Lacks, MD  cholecalciferol (VITAMIN D) 1000 units tablet Take 1 tablet (1,000 Units total) by mouth daily. 03/29/16   Angiulli, Lavon Paganini, PA-C  clopidogrel (PLAVIX) 75 MG tablet Take 1 tablet (75 mg total) by mouth daily. 06/06/16   Plotnikov, Evie Lacks, MD  docusate sodium (COLACE) 100 MG capsule Take 1 capsule (100 mg total) by mouth 2 (two) times daily. 11/01/16   Nche, Charlene Brooke, NP  finasteride (PROSCAR) 5 MG tablet Take 1 tablet (5 mg total) by mouth daily. For Prostate 03/29/16   AngiulliLavon Paganini, PA-C  FLUoxetine (PROZAC) 20 MG tablet Take 1 tablet (20 mg total) by mouth daily. 09/10/16   Plotnikov, Evie Lacks, MD  gabapentin (NEURONTIN) 100 MG capsule Take 1 capsule (100 mg total) 3 (three) times daily by mouth. For pain 11/30/16   Plotnikov, Evie Lacks, MD  glucose blood (ACCU-CHEK SMARTVIEW) test strip Used to check blood sugar BID. TG:Y56.389 05/08/17   Plotnikov, Evie Lacks, MD  hydrALAZINE (APRESOLINE) 10 MG tablet Take 1 tablet (10 mg total) by mouth every 8 (eight) hours.  06/06/16   Plotnikov, Evie Lacks, MD  isosorbide mononitrate (IMDUR) 30 MG 24 hr tablet Take 1 tablet (30 mg total) by mouth daily. 10/13/16   Barton Dubois, MD  lovastatin (MEVACOR) 20 MG tablet Take 1 tablet (20 mg total) by mouth daily. For Cholesterol 03/29/16   Angiulli, Lavon Paganini, PA-C  metoprolol succinate (TOPROL-XL) 100 MG 24 hr tablet Take 1 tablet (100 mg total) by mouth daily. Take with or immediately following a meal. 10/13/16   Barton Dubois, MD  pantoprazole (PROTONIX) 40 MG tablet Take 1 tablet (40 mg total) by mouth daily. 10/19/16   Nche, Charlene Brooke, NP  ranitidine (ZANTAC) 150 MG tablet Take 1 tablet (150 mg total) by mouth at bedtime. 10/12/16 10/12/17  Barton Dubois, MD  repaglinide (PRANDIN) 1 MG tablet Take 1 tablet (1 mg  total) by mouth 3 (three) times daily before meals. 06/13/16   Plotnikov, Evie Lacks, MD  Skin Protectants, Misc. (EUCERIN) cream Apply 1 application topically 2 (two) times daily as needed for dry skin.    [provider]    Family History Family History  Problem Relation Age of Onset  . Hypertension Mother   . Kidney disease Father   . Hypertension Father   . Coronary artery disease Unknown        1st degree Male relative <60  . Diabetes Unknown        1st degree relative    Social History Social History   Tobacco Use  . Smoking status: Former Smoker    Packs/day: 2.00    Years: 35.00    Pack years: 70.00    Types: Cigarettes    Last attempt to quit: 1991    Years since quitting: 29.1  . Smokeless tobacco: Never Used  Substance Use Topics  . Alcohol use: No  . Drug use: No     Allergies   Hydroxyzine; Verapamil; and Benazepril hcl   Review of Systems Review of Systems  Constitutional: Positive for fatigue. Negative for chills, diaphoresis and fever.  Respiratory: Positive for shortness of breath. Negative for cough.   Cardiovascular: Positive for chest pain. Negative for leg swelling.  Gastrointestinal: Negative for  abdominal pain, constipation, diarrhea, nausea and vomiting.  Genitourinary: Negative for dysuria and hematuria.  Musculoskeletal: Negative for arthralgias and myalgias.  Skin: Negative for color change.  Allergic/Immunologic: Positive for immunocompromised state (DM2).  Neurological: Negative for weakness, light-headedness and numbness.  Psychiatric/Behavioral: Negative for confusion.   All other systems reviewed and are negative for acute change except as noted in the HPI.    Physical Exam Updated Vital Signs BP (!) 177/76   Pulse 76   Temp 98.6 F (37 C) (Oral)   Resp 17   Ht '5\' 6"'  (1.676 m)   Wt 83 kg   SpO2 99%   BMI 29.54 kg/m   Physical Exam Vitals signs and nursing note reviewed.  Constitutional:      General: He is not in acute distress.    Appearance: Normal appearance. He is well-developed. He is not toxic-appearing.     Comments: Afebrile, nontoxic, NAD  HENT:     Head: Normocephalic and atraumatic.  Eyes:     General:        Right eye: No discharge.        Left eye: No discharge.     Conjunctiva/sclera: Conjunctivae normal.  Neck:     Musculoskeletal: Normal range of motion and neck supple.  Cardiovascular:     Rate and Rhythm: Normal rate and regular rhythm.     Pulses: Normal pulses.     Heart sounds: Normal heart sounds, S1 normal and S2 normal. No murmur. No friction rub. No gallop.      Comments: RRR, nl s1/s2, no m/r/g, distal pulses intact, no pedal edema  Pulmonary:     Effort: Pulmonary effort is normal. No respiratory distress.     Breath sounds: Normal breath sounds. No decreased breath sounds, wheezing, rhonchi or rales.     Comments: CTAB in all lung fields, no w/r/r, no hypoxia or increased WOB, speaking in full sentences, SpO2 99% on RA  Chest:     Chest wall: No deformity, tenderness or crepitus.     Comments: Chest wall nonTTP without crepitus, deformities, or retractions  Abdominal:     General: Bowel sounds  are normal. There is no  distension.     Palpations: Abdomen is soft. Abdomen is not rigid.     Tenderness: There is abdominal tenderness in the left upper quadrant. There is no right CVA tenderness, left CVA tenderness, guarding or rebound. Negative signs include Murphy's sign and McBurney's sign.     Comments: Soft, nondistended, +BS throughout, with mild LUQ TTP, no r/g/r, neg murphy's, neg mcburney's, no CVA TTP   Musculoskeletal: Normal range of motion.  Skin:    General: Skin is warm and dry.     Findings: No rash.  Neurological:     Mental Status: He is alert and oriented to person, place, and time.     Sensory: Sensation is intact. No sensory deficit.     Motor: Motor function is intact.  Psychiatric:        Mood and Affect: Mood and affect normal.        Behavior: Behavior normal.      ED Treatments / Results  Labs (all labs ordered are listed, but only abnormal results are displayed) Labs Reviewed  COMPREHENSIVE METABOLIC PANEL - Abnormal; Notable for the following components:      Result Value   CO2 20 (*)    Glucose, Bld 166 (*)    BUN 29 (*)    Creatinine, Ser 2.40 (*)    Calcium 8.5 (*)    Albumin 2.8 (*)    GFR calc non Af Amer 25 (*)    GFR calc Af Amer 29 (*)    All other components within normal limits  CBC WITH DIFFERENTIAL/PLATELET - Abnormal; Notable for the following components:   WBC 11.2 (*)    RBC 3.96 (*)    Hemoglobin 10.9 (*)    HCT 35.2 (*)    Neutro Abs 9.0 (*)    All other components within normal limits  BRAIN NATRIURETIC PEPTIDE - Abnormal; Notable for the following components:   B Natriuretic Peptide 359.7 (*)    All other components within normal limits  LIPASE, BLOOD  I-STAT TROPONIN, ED    EKG EKG Interpretation  Date/Time:  Saturday March 08 2018 16:01:45 EST Ventricular Rate:  75 PR Interval:    QRS Duration: 103 QT Interval:  400 QTC Calculation: 447 R Axis:   -4 Text Interpretation:  Sinus rhythm Nonspecific T abnormalities, lateral leads  Baseline wander in lead(s) V6 similar to prior 10/18 Confirmed by Aletta Edouard [phone]) on 03/08/2018 4:04:33 PM   Radiology Dg Chest 2 View  Result Date: 03/08/2018 CLINICAL DATA:  One-week history of LEFT-sided chest pain and shortness of breath. EXAM: CHEST - 2 VIEW COMPARISON:  10/11/2016 and earlier. FINDINGS: AP SEMI-ERECT and LATERAL images were obtained. Prior sternotomy for CABG. Cardiac silhouette upper normal in size for AP technique. Hilar and mediastinal contours unremarkable. Lungs clear. Bronchovascular markings normal. Pulmonary vascularity normal. No visible pleural effusions. No pneumothorax. Degenerative changes involving the thoracic and upper lumbar spine. IMPRESSION: No acute cardiopulmonary disease. Electronically Signed   By: Evangeline Dakin M.D.   On: 03/08/2018 17:31     Echo 03/14/16: Study Conclusions - Left ventricle: The cavity size was normal. Wall thickness was   increased in a pattern of mild LVH. The estimated ejection   fraction was 55%. Basal to mid inferior hypokinesis. Doppler   parameters are consistent with abnormal left ventricular   relaxation (grade 1 diastolic dysfunction). - Aortic valve: There was no stenosis. - Mitral valve: There was no significant regurgitation. - Right  ventricle: Poorly visualized. Probably normal size and   systolic function. - Pulmonary arteries: No complete TR doppler jet so unable to   estimate PA systolic pressure. - Inferior vena cava: The vessel was normal in size. The   respirophasic diameter changes were in the normal range (>= 50%),   consistent with normal central venous pressure.  Impressions: - Technically difficult study with poor acoustic windows. Normal LV   size with mild LV hypertrophy. EF 55%. Basal to mid inferior   hypokinesis. Poorly visualized RV, but probably normal size and   systolic function. No significant valvular abnormalities.    Procedures Procedures (including critical care  time)  Medications Ordered in ED Medications  aspirin chewable tablet 324 mg (324 mg Oral Given 03/08/18 1640)  alum & mag hydroxide-simeth (MAALOX/MYLANTA) 200-200-20 MG/5ML suspension 30 mL (30 mLs Oral Given 03/08/18 1640)    And  lidocaine (XYLOCAINE) 2 % viscous mouth solution 15 mL (15 mLs Oral Given 03/08/18 1640)  morphine 4 MG/ML injection 4 mg (4 mg Intravenous Given 03/08/18 1645)     Initial Impression / Assessment and Plan / ED Course  I have reviewed the triage vital signs and the nursing notes.  Pertinent labs & imaging results that were available during my care of the patient were reviewed by me and considered in my medical decision making (see chart for details).  Clinical Course as of Mar 09 1735  Sat Mar 08, 7046  7456 78 year old male with some chest pain that is been on and off for 4 days.  He says he has no pain right now.  EKG does not show any acute ST-T changes.  He is first troponin is negative.  Will reevaluate when his work-up is complete   [MB]    Clinical Course User Index [MB] Hayden Rasmussen, MD    79 y.o. male here with chest pain intermittent since 4 days ago.  States some mild shortness of breath and generally weak/fatigued.  On exam, mild LUQ abdominal tenderness, non-peritoneal, no chest wall tenderness, clear lung exam, no tachycardia or hypoxia, no pedal edema, negative Homans sign bilaterally. EKG without acute ischemic findings, some TWI in lateral leads but otherwise no acute findings. Will get labs, CXR, give ASA, GI cocktail, morphine, and reassess shortly. Discussed case with my attending Dr. Melina Copa who agrees with plan.   6:17 PM CBC w/diff with mildly elevated WBC 11.2 and mildly low Hgb 10.9/Hct 35.2 (no recent labs to compare, most recent from 2018). CMP with stable kidney function but otherwise fairly unremarkable. Trop neg. Lipase WNL. BNP mildly elevated at 359.7 although pt not clinically fluid overloaded. CXR negative for acute finding.  Given his high HEART score and risk factors, will proceed with admission. Pt states that the meds helped "slow it down some" and he feels somewhat better.   6:33 PM Dr. Earnest Conroy of [facility] returning page and will admit. Wants D-dimer, which has been ordered. Holding orders to be placed by admitting team. Please see their notes for further documentation of care. I appreciate their help with this pleasant pt's care. Pt stable at time of admission.    Final Clinical Impressions(s) / ED Diagnoses   Final diagnoses:  Intermittent left-sided chest pain  SOB (shortness of breath)    ED Discharge Orders    [address] [place], [place] 03/08/18 1834    Hayden Rasmussen, MD 03/09/18 1119

## 2018-03-09 ENCOUNTER — Observation Stay (HOSPITAL_BASED_OUTPATIENT_CLINIC_OR_DEPARTMENT_OTHER): Payer: Medicare Other

## 2018-03-09 ENCOUNTER — Observation Stay (HOSPITAL_COMMUNITY): Payer: Medicare Other

## 2018-03-09 DIAGNOSIS — M79609 Pain in unspecified limb: Secondary | ICD-10-CM | POA: Diagnosis not present

## 2018-03-09 DIAGNOSIS — R0789 Other chest pain: Secondary | ICD-10-CM | POA: Diagnosis not present

## 2018-03-09 LAB — GLUCOSE, CAPILLARY
Glucose-Capillary: 62 mg/dL — ABNORMAL LOW (ref 70–99)
Glucose-Capillary: 93 mg/dL (ref 70–99)
Glucose-Capillary: 99 mg/dL (ref 70–99)
Glucose-Capillary: 107 mg/dL — ABNORMAL HIGH (ref 70–99)
Glucose-Capillary: 112 mg/dL — ABNORMAL HIGH (ref 70–99)
Glucose-Capillary: 111 mg/dL — ABNORMAL HIGH (ref 70–99)

## 2018-03-09 LAB — TROPONIN I
Troponin I: <0.03 ng/mL (ref <0.03)
Troponin I: <0.03 ng/mL (ref <0.03)

## 2018-03-09 LAB — BASIC METABOLIC PANEL
Anion gap: 5 (ref 5–15)
BUN: 29 mg/dL — ABNORMAL HIGH (ref 8–23)
CALCIUM: 8.5 mg/dL — AB (ref 8.9–10.3)
CO2: 24 mmol/L (ref 22–32)
Chloride: 108 mmol/L (ref 98–111)
Creatinine, Ser: 2.17 mg/dL — ABNORMAL HIGH (ref 0.61–1.24)
GFR calc Af Amer: 33 mL/min — ABNORMAL LOW (ref >60)
GFR calc non Af Amer: 28 mL/min — ABNORMAL LOW (ref >60)
Glucose, Bld: 108 mg/dL — ABNORMAL HIGH (ref 70–99)
Potassium: 4.3 mmol/L (ref 3.5–5.1)
Sodium: 137 mmol/L (ref 135–145)

## 2018-03-09 LAB — CBC
HCT: 33.7 % — ABNORMAL LOW (ref 39.0–52.0)
HEMOGLOBIN: 10.7 g/dL — AB (ref 13.0–17.0)
MCH: 27.9 pg (ref 26.0–34.0)
MCHC: 31.8 g/dL (ref 30.0–36.0)
MCV: 88 fL (ref 80.0–100.0)
Platelets: 187 10*3/uL (ref 150–400)
RBC: 3.83 MIL/uL — ABNORMAL LOW (ref 4.22–5.81)
RDW: 14.2 % (ref 11.5–15.5)
WBC: 11.2 10*3/uL — ABNORMAL HIGH (ref 4.0–10.5)
nRBC: 0 % (ref 0.0–0.2)

## 2018-03-09 LAB — LIPID PANEL
Cholesterol: 120 mg/dL (ref 0–200)
HDL: 53 mg/dL (ref >40)
LDL Cholesterol: 60 mg/dL (ref 0–99)
Total CHOL/HDL Ratio: 2.3 RATIO
Triglycerides: 36 mg/dL (ref <150)
VLDL: 7 mg/dL (ref 0–40)

## 2018-03-09 LAB — MRSA PCR SCREENING: MRSA by PCR: POSITIVE — AB

## 2018-03-09 MED ORDER — TECHNETIUM TO 99M ALBUMIN AGGREGATED
4.3900 | Freq: Once | INTRAVENOUS | Status: AC | PRN
  Administered 2018-03-09: 4.39 via INTRAVENOUS

## 2018-03-09 MED ORDER — CHLORHEXIDINE GLUCONATE CLOTH 2 % EX PADS: 6.0000 | MEDICATED_PAD | Freq: Every day | CUTANEOUS | Status: DC

## 2018-03-09 MED ORDER — TECHNETIUM TC 99M DIETHYLENETRIAME-PENTAACETIC ACID
30.7000 | Freq: Once | INTRAVENOUS | Status: AC | PRN
  Administered 2018-03-09: 30.7 via RESPIRATORY_TRACT

## 2018-03-09 MED ORDER — MUPIROCIN 2 % EX OINT
1.0000 "application " | TOPICAL_OINTMENT | Freq: Two times a day (BID) | CUTANEOUS | Status: DC
  Administered 2018-03-09 – 2018-03-10 (×3): 1 via NASAL
  Filled 2018-03-09 (×2): qty 22

## 2018-03-09 NOTE — Progress Notes (Signed)
Progress Note    Caleb Hoffmann Sr.  EUM:[id number] DOB: 08/03/1940  DOA: 03/08/2018 PCP: Cassandria Anger, MD    Brief Narrative:     Medical records reviewed and are as summarized below:  Caleb Nim Sr. is an 78 y.o. 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    Assessment/Plan:   Active Problems:   Atypical chest pain    Atypical chest pain:  -pleuritic component suspect musculoskeletal in origin- has been going to physical therapy and doing more exercises -CE negative -d dimer elevated: V/Q scan negative, duplex negative -monitor overnight and d/c back to rehab in AM   CAD, MI status post CABG: - Resume aspirin, Plavix, Imdur, beta-blockers and ACE inhibitors   Stage I diastolic dysfunction/chronic CHF: Appears euvolemic.  Not on diuretics.  History of CVA:  -Resume aspirin and Plavix    Diabetes mellitus: -SSI -resume home medications as no plans for NPO   Diabetic retinopathy/blindness and neuropathy: Resume oral hypoglycemics and Neurontin  Chronic kidney disease stage IV: Appears stable and creatinine at baseline.  Resume home medications   Hypertension: Increase hydralazine dosage and additional dosing PRN  BP was elevated upon admission   Hyperlipidemia:  -LDL 60 - Resume statin  obesity Body mass index is 32.38 kg/m.   Family Communication/Anticipated D/C date and plan/Code Status   DVT prophylaxis: Lovenox ordered. Code Status: dnr Family Communication: multiple people at bedside Disposition Plan: from facility [facility]-- back in AM if not further issues)   Medical Consultants:    None.    Subjective:   Occasionally with get pain with deep breathing  Objective:    Vitals:    03/09/18 0400 03/09/18 0900 03/09/18 1334 03/09/18 1337  BP: (!) 189/76 (!) 190/86 (!) 142/110 (!) 165/61  Pulse: 63 67 68   Resp: 18  16   Temp: 97.7 F (36.5 C)  97.7 F (36.5 C)   TempSrc: Oral  Oral   SpO2: 98%  99%   Weight:      Height:        Intake/Output Summary (Last 24 hours) at 03/09/2018 1517 Last data filed at 03/09/2018 1300 Gross per 24 hour  Intake 360 ml  Output -  Net 360 ml   Filed Weights   03/08/18 1556 03/08/18 2031  Weight: 83 kg 91 kg    Exam: In bed, NAD Blind A+OX3 rrr No wheezing, no increased work of breathing- mild tenderness to palpation No LE edema  Data Reviewed:   I have personally reviewed following labs and imaging studies:  Labs: Labs show the following:   Basic Metabolic Panel: Recent Labs  Lab 03/08/18 1649 03/08/18 1949 03/09/18 0209  NA 135  --  137  K 4.7  --  4.3  CL 106  --  108  CO2 20*  --  24  GLUCOSE 166*  --  108*  BUN 29*  --  29*  CREATININE 2.40* 2.32* 2.17*  CALCIUM 8.5*  --  8.5*   GFR Estimated Creatinine Clearance: 29.6 mL/min (A) (by C-G formula based on SCr of 2.17 mg/dL (H)). Liver Function Tests: Recent Labs  Lab 03/08/18 1649  AST 17  ALT 17  ALKPHOS 62  BILITOT 0.6  PROT 7.0  ALBUMIN 2.8*   Recent Labs  Lab 03/08/18 1649  LIPASE 22   No results for input(s): AMMONIA in the last 168 hours. Coagulation profile No results for input(s): INR, PROTIME in the last 168 hours.  CBC: Recent Labs  Lab 03/08/18 1649 03/09/18 0209  WBC 11.2* 11.2*  NEUTROABS 9.0*  --   HGB 10.9* 10.7*  HCT 35.2* 33.7*  MCV 88.9 88.0  PLT 190 187   Cardiac Enzymes: Recent Labs  Lab 03/08/18 1949 03/09/18 0209 03/09/18 0752  CKTOTAL 31*  --   --   TROPONINI <0.03 <0.03 <0.03   BNP (last 3 results) No results for input(s): PROBNP in the last 8760 hours. CBG: Recent Labs  Lab 03/08/18 2103 03/09/18 0632 03/09/18 0706 03/09/18 1332  GLUCAP 119* 62* 93 99   D-Dimer: Recent Labs     03/08/18 1649  DDIMER 1.54*   Hgb A1c: No results for input(s): HGBA1C in the last 72 hours. Lipid Profile: Recent Labs    03/09/18 0209  CHOL 120  HDL 53  LDLCALC 60  TRIG 36  CHOLHDL 2.3   Thyroid function studies: No results for input(s): TSH, T4TOTAL, T3FREE, THYROIDAB in the last 72 hours.  Invalid input(s): FREET3 Anemia work up: No results for input(s): VITAMINB12, FOLATE, FERRITIN, TIBC, IRON, RETICCTPCT in the last 72 hours. Sepsis Labs: Recent Labs  Lab 03/08/18 1649 03/09/18 0209  WBC 11.2* 11.2*    Microbiology Recent Results (from the past 240 hour(s))  MRSA PCR Screening     Status: Abnormal   Collection Time: 03/08/18 11:31 PM  Result Value Ref Range Status   MRSA by PCR POSITIVE (A) NEGATIVE Final    Comment: CRITICAL RESULT CALLED TO, READ BACK BY AND VERIFIED WITH: RNRosina Lowenstein [id number] @0305  THANEY Performed at [facility], [address], [place],  [address]     Procedures and diagnostic studies:  Dg Chest 2 View  Result Date: 03/08/2018 CLINICAL DATA:  One-week history of LEFT-sided chest pain and shortness of breath. EXAM: CHEST - 2 VIEW COMPARISON:  10/11/2016 and earlier. FINDINGS: AP SEMI-ERECT and LATERAL images were obtained. Prior sternotomy for CABG. Cardiac silhouette upper normal in size for AP technique. Hilar and mediastinal contours unremarkable. Lungs clear. Bronchovascular markings normal. Pulmonary vascularity normal. No visible pleural effusions. No pneumothorax. Degenerative changes involving the thoracic and upper lumbar spine. IMPRESSION: No acute cardiopulmonary disease. Electronically Signed   By: Evangeline Dakin M.D.   On: 03/08/2018 17:31   Nm Pulmonary Perf And Vent  Result Date: 03/09/2018 CLINICAL DATA:  Intermittent chest pain, worse with inspiration. Mild shortness of breath. Clinical concern of pulmonary embolism. EXAM: NUCLEAR MEDICINE VENTILATION - PERFUSION LUNG SCAN TECHNIQUE: Ventilation images  were obtained in multiple projections using inhaled aerosol Tc-37m DTPA. Perfusion images were obtained in multiple projections after intravenous injection of Tc-59m MAA. RADIOPHARMACEUTICALS:  30.7 mCi of Tc-31m DTPA aerosol inhalation and 4.39 mCi Tc63m MAA IV COMPARISON:  Chest radiographs 03/08/2018 FINDINGS: Ventilation: No focal ventilation defect. Perfusion: Normal perfusion study, matched to the ventilatory examination. No evidence of pulmonary embolism. IMPRESSION: Normal examination.  No evidence of pulmonary embolism. Electronically Signed   By: Richardean Sale M.D.   On: 03/09/2018 13:35   Vas [place] Lower Extremity Venous (dvt)  Result Date: 03/09/2018  Lower Venous Study Indications: Pain.  Comparison Study: No prior venous study on file Performing Technologist: Sharion Dove RVS  Examination Guidelines: A complete evaluation includes B-mode imaging, spectral Doppler, color Doppler, and power Doppler as needed of all accessible  portions of each vessel. Bilateral testing is considered an integral part of a complete examination. Limited examinations for reoccurring indications may be performed as noted.  Right Venous Findings: +---+---------------+---------+-----------+----------+-------+    CompressibilityPhasicitySpontaneityPropertiesSummary +---+---------------+---------+-----------+----------+-------+ CFVFull           Yes      Yes                          +---+---------------+---------+-----------+----------+-------+  Left Venous Findings: +---------+---------------+---------+-----------+----------+-------+          CompressibilityPhasicitySpontaneityPropertiesSummary +---------+---------------+---------+-----------+----------+-------+ CFV      Full           Yes      Yes                          +---------+---------------+---------+-----------+----------+-------+ SFJ      Full                                                  +---------+---------------+---------+-----------+----------+-------+ FV Prox  Full                                                 +---------+---------------+---------+-----------+----------+-------+ FV Mid   Full                                                 +---------+---------------+---------+-----------+----------+-------+ FV DistalFull                                                 +---------+---------------+---------+-----------+----------+-------+ PFV      Full                                                 +---------+---------------+---------+-----------+----------+-------+ POP      Full           Yes      Yes                          +---------+---------------+---------+-----------+----------+-------+ PTV      Full                                                 +---------+---------------+---------+-----------+----------+-------+ PERO     Full                                                 +---------+---------------+---------+-----------+----------+-------+    Summary: Right: No evidence of common femoral vein obstruction. Left: There is no evidence of deep vein thrombosis in the  lower extremity. Sluggish flow noted  *See table(s) above for measurements and observations. Electronically signed by Ruta Hinds MD on 03/09/2018 at 2:15:16 PM.    Final     Medications:   . acetaminophen  500 mg Oral BID  . aspirin EC  81 mg Oral Daily  . atorvastatin  10 mg Oral Daily  . [START ON 03/10/2018] Chlorhexidine Gluconate Cloth  6 each Topical Q0600  . cholecalciferol  1,000 Units Oral Daily  . clopidogrel  75 mg Oral Daily  . cycloSPORINE  1 drop Both Eyes BID  . docusate sodium  100 mg Oral BID  . enoxaparin (LOVENOX) injection  40 mg Subcutaneous Q24H  . famotidine  20 mg Oral Daily  . finasteride  5 mg Oral Daily  . FLUoxetine  20 mg Oral Daily  . gabapentin  100 mg Oral TID  . hydrALAZINE  25 mg Oral Q8H  . insulin aspart  0-9 Units  Subcutaneous TID WC  . isosorbide mononitrate  30 mg Oral Daily  . losartan  100 mg Oral Daily  . metoprolol succinate  100 mg Oral Daily  . mupirocin ointment  1 application Nasal BID  . pantoprazole  40 mg Oral Daily  . polyvinyl alcohol  1-2 drop Both Eyes QID  . repaglinide  0.5 mg Oral TID AC   Continuous Infusions:   LOS: 0 days   Geradine Girt  [facility]   How to contact the [facility] Attending or Consulting provider [place] or covering provider during after hours [place], for this patient?  1. Check the care team in [facility] and look for a) attending/consulting [facility] provider listed and b) the [facility] team listed 2. Log into [email or web address] and use [place]'s universal password to access. If you do not have the password, please contact the hospital operator. 3. Locate the [facility] provider you are looking for under [facility] and page to a number that you can be directly reached. 4. If you still have difficulty reaching the provider, please page the [facility] (Director on Call) for the Hospitalists listed on amion for assistance.  03/09/2018, 3:17 PM

## 2018-03-09 NOTE — Progress Notes (Signed)
CRITICAL VALUE ALERT  Critical Value: MRSA PCR POSITIVE   Date & Time Notied:  03/09/18  03:05  Provider Notified: Eulogio Bear, DO  Orders Received/Actions taken:  Placed on contact isolation.

## 2018-03-09 NOTE — Progress Notes (Signed)
Pt states he does not want to eat dinner Pt is lethargic, vital signs documented and spot checked CBG Will continue to monitor

## 2018-03-09 NOTE — Progress Notes (Signed)
VASCULAR LAB PRELIMINARY  PRELIMINARY  PRELIMINARY  PRELIMINARY  Left lower extremity venous duplex completed.    Preliminary report:  See CV Proc  Carleah Yablonski, RVT 03/09/2018, 1:01 PM

## 2018-03-10 DIAGNOSIS — N183 Chronic kidney disease, stage 3 (moderate): Secondary | ICD-10-CM

## 2018-03-10 DIAGNOSIS — I25810 Atherosclerosis of coronary artery bypass graft(s) without angina pectoris: Secondary | ICD-10-CM

## 2018-03-10 DIAGNOSIS — I5189 Other ill-defined heart diseases: Secondary | ICD-10-CM

## 2018-03-10 DIAGNOSIS — Z8673 Personal history of transient ischemic attack (TIA), and cerebral infarction without residual deficits: Secondary | ICD-10-CM

## 2018-03-10 DIAGNOSIS — D638 Anemia in other chronic diseases classified elsewhere: Secondary | ICD-10-CM | POA: Diagnosis not present

## 2018-03-10 DIAGNOSIS — E1142 Type 2 diabetes mellitus with diabetic polyneuropathy: Secondary | ICD-10-CM

## 2018-03-10 DIAGNOSIS — R0789 Other chest pain: Secondary | ICD-10-CM | POA: Diagnosis not present

## 2018-03-10 LAB — GLUCOSE, CAPILLARY
Glucose-Capillary: 54 mg/dL — ABNORMAL LOW (ref 70–99)
Glucose-Capillary: 98 mg/dL (ref 70–99)
Glucose-Capillary: 79 mg/dL (ref 70–99)

## 2018-03-10 LAB — BASIC METABOLIC PANEL
Anion gap: 10 (ref 5–15)
BUN: 28 mg/dL — ABNORMAL HIGH (ref 8–23)
CHLORIDE: 106 mmol/L (ref 98–111)
CO2: 20 mmol/L — ABNORMAL LOW (ref 22–32)
CREATININE: 2.16 mg/dL — AB (ref 0.61–1.24)
Calcium: 8.4 mg/dL — ABNORMAL LOW (ref 8.9–10.3)
GFR calc Af Amer: 33 mL/min — ABNORMAL LOW (ref >60)
GFR calc non Af Amer: 28 mL/min — ABNORMAL LOW (ref >60)
Glucose, Bld: 79 mg/dL (ref 70–99)
Potassium: 3.9 mmol/L (ref 3.5–5.1)
Sodium: 136 mmol/L (ref 135–145)

## 2018-03-10 LAB — CBC
HCT: 34.4 % — ABNORMAL LOW (ref 39.0–52.0)
Hemoglobin: 11.5 g/dL — ABNORMAL LOW (ref 13.0–17.0)
MCH: 28.9 pg (ref 26.0–34.0)
MCHC: 33.4 g/dL (ref 30.0–36.0)
MCV: 86.4 fL (ref 80.0–100.0)
Platelets: 206 10*3/uL (ref 150–400)
RBC: 3.98 MIL/uL — ABNORMAL LOW (ref 4.22–5.81)
RDW: 14.2 % (ref 11.5–15.5)
WBC: 8.7 10*3/uL (ref 4.0–10.5)
nRBC: 0 % (ref 0.0–0.2)

## 2018-03-10 MED ORDER — REPAGLINIDE 0.5 MG PO TABS
0.2500 mg | ORAL_TABLET | Freq: Three times a day (TID) | ORAL | Status: DC
  Administered 2018-03-10: 0.25 mg via ORAL
  Filled 2018-03-10 (×2): qty 1

## 2018-03-10 MED ORDER — REPAGLINIDE 0.5 MG PO TABS: 0.2500 mg | ORAL_TABLET | Freq: Three times a day (TID) | ORAL | 0 refills | Status: AC

## 2018-03-10 MED ORDER — MUPIROCIN 2 % EX OINT: TOPICAL_OINTMENT | Freq: Three times a day (TID) | CUTANEOUS | 0 refills | Status: AC

## 2018-03-10 MED ORDER — HYDRALAZINE HCL 25 MG PO TABS: 25.0000 mg | ORAL_TABLET | Freq: Three times a day (TID) | ORAL | 0 refills | Status: AC

## 2018-03-10 NOTE — Clinical Social Work Placement (Addendum)
   CLINICAL SOCIAL WORK PLACEMENT  NOTE  Date:  03/10/2018  Patient Details  Name: Caleb Dunn Sr. MRN: [id number] Date of Birth: 16-May-1940  Clinical Social Work is seeking post-discharge placement for this patient at the [place] level of care (*CSW will initial, date and re-position this form in  chart as items are completed):      Patient/family provided with [place] Work Department's list of facilities offering this level of care within the geographic area requested by the patient (or if unable, by the patient's family).      Patient/family informed of their freedom to choose among providers that offer the needed level of care, that participate in Medicare, Medicaid or managed care program needed by the patient, have an available bed and are willing to accept the patient.      Patient/family informed of [place]'s ownership interest in [facility] and [facility], as well as of the fact that they are under no obligation to receive care at these facilities.  PASRR submitted to EDS on       PASRR number received on       Existing PASRR number confirmed on       FL2 transmitted to all facilities in geographic area requested by pt/family on       FL2 transmitted to all facilities within larger geographic area on       Patient informed that his/her managed care company has contracts with or will negotiate with certain facilities, including the following:            Patient/family informed of bed offers received.  Patient chooses bed at [facility]     Physician recommends and patient chooses bed at      Patient to be transferred to [facility] on 03/10/18.  Patient to be transferred to facility by [facility]     Patient family notified on 03/10/18 of transfer.  Name of family member notified:  Family at bedside--aware   PHYSICIAN       Additional Comment:    _______________________________________________ Eileen Stanford,  LCSW 03/10/2018, 1:30 PM

## 2018-03-10 NOTE — Progress Notes (Signed)
Pt CBG low Pt alert, oriented, and talking. Pt drank 120 ml of orange juice and ate crackers, will recheck in 15 minutes

## 2018-03-10 NOTE — Progress Notes (Signed)
Pt son wishes to be contacted when pt discharged at [phone], Caleb Dunn

## 2018-03-10 NOTE — Progress Notes (Signed)
Report given to [facility]. Patient transported by Corey Harold to Facility

## 2018-03-10 NOTE — Discharge Summary (Addendum)
Caleb Rhem Sr., is a 78 y.o. male  DOB 16-Mar-1940  MRN [id number].  Admission date:  03/08/2018  Admitting Physician  Guilford Shi, MD  Discharge Date:  03/10/2018   Primary MD  Plotnikov, Evie Lacks, MD  Recommendations for primary care physician for things to follow:   - Monitor blood glucose readings as has been having a.m. hypoglycemia and need further adjustment of Prandin dose. -Monitoring of blood pressures   Discharge Diagnosis   Principal Problem:   Atypical chest pain Active Problems:   Hyperlipidemia   CKD (chronic kidney disease), stage III (HCC)   Essential hypertension   Type 2 diabetes mellitus with peripheral neuropathy (HCC)   History of stroke   CAD (coronary artery disease) of artery bypass graft   Diastolic dysfunction   Anemia of chronic disease      Past Medical History:  Diagnosis Date  . Blind in both eyes   . CAD (coronary artery disease)   . Cholelithiasis   . CTS (carpal tunnel syndrome)    Left  . CVA (cerebral vascular accident) [facility]) ~ 02/2016   "dr says my left side is weaker now; I can'r tell" (10/11/2016)  . DM type 2 with diabetic peripheral neuropathy ([place])   . ED (erectile dysfunction)   . Elevated PSA   . GERD (gastroesophageal reflux disease)   . Glaucoma, both eyes   . High cholesterol   . HTN (hypertension)   . 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 MI (myocardial infarction) ([place]) 1991  . Thyroid nodule   . Type II diabetes mellitus ([place])     Past Surgical History:  Procedure Laterality Date  . CORONARY ANGIOPLASTY WITH STENT PLACEMENT  04/26/2000   Archie Endo 06/06/2010  . CORONARY ARTERY BYPASS GRAFT  2002   Archie Endo 06/06/2010  . EYE SURGERY Bilateral    "did laser on them"  . GLAUCOMA SURGERY  Bilateral    several/notes 06/06/2010  . LAPAROSCOPIC CHOLECYSTECTOMY  10/2004   Archie Endo 06/06/2010  . NECK MASS EXCISION Left    "benign"  . VITRECTOMY Right 05/01/2004   Archie Endo 06/06/2010       HPI  from the history and physical done on the day of admission:    Caleb Mozer Sr. is a 78 y.o. 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  Patient states he has ataxia at baseline and has had multiple falls.  The last fall was about a month back where he held onto the wall in the bathroom.  He denies any chest trauma.  He has been experiencing episodes of chest pain for the last 4 to 5 days, described as sharp, 5/10, left-sided worse with position changes and on taking deep breath.  Per patient episodes usually last few seconds.  He states he had similar episode this morning and received nitro at the facility with no relief. He states chest pain resolved after he received medications in the ED here (GI cocktail, aspirin 325 mg and morphine). Currently he is chest pain-free even when he takes deep breath.  He denies any recent upper respiratory illness.  He is on maximum antiplatelet therapy with aspirin and Plavix at baseline.  He states he usually follows [place] [facility] and he believes his last stress test was about a year back.  Last echo in our record here from 2018 showed stage I diastolic dysfunction.  He denies any recent leg swellings or weight gain or shortness of breath.  Document sent from assisted living facility included DNR form and patient confirmed the code status.  Work-up in the ED including EKG, troponin unremarkable.  Other labs show stable creatinine around 2.4, hemoglobin at 10.9, BNP at 359.  Chest x-ray unremarkable.  Patient is requested to be  admitted for further evaluation and management.     Hospital Course:    Atypical chest pain: Patient presented with complaints of pleuritic-like chest pain.  He had been getting increased physical therapy at [place] [facility] facility.  EKG without significant ischemic changes similar to previous.  Troponins remained negative, but patient was found to have a mildly elevated d-dimer.  Vascular Doppler ultrasound of bilateral lower extremities showed no signs of a DVT and VQ scan was negative for any signs of a pulmonary embolus.   Coronary artery disease, MI status post CABG: Patient was continued on aspirin, Plavix, indoor, beta-blocker, and ACE inhibitor's.  Diastolic dysfunction: Stable.  Admission BNP mildly elevated at 359.7.  Clinically patient appeared to be euvolemic, and he is not on diuretics.  Chest x-ray was otherwise clear of any acute abnormalities.  Last echocardiogram from 03/14/2016 noted EF of 55% with basal to mid inferior hypokinesis with grade 1 diastolic dysfunction.    Diabetes mellitus type 2 with hypoglycemia, diabetic neuropathy and retinopathy: Acute on chronic.  Patient with blood sugars in the 50s to 60s early morning on 2/16 and 2/17.  Last hemoglobin A1c on file noted to be 7.2 on 05/13/2017.  Patient did not receive any subcutaneous insulin and is only on Prandin. Dosage decreased from 0.5 mg TID to 0.25 mg TID.  Recommending continued checks and monitoring for hypoglycemia.  He was continued on medications of Neurontin.  Essential hypertension: Blood pressures initially noted elevated up to 192/86.  Patient dose of hydralazine was increased to 25 mg 3 times daily.  Blood pressure prior to discharge 138/62.  History of CVA(non-hemorrhagic): Stable.  Patient with mild residual left-sided weakness from previous stroke in 2018. He was continued on aspirin and Plavix  Anemia of chronic kidney disease: Acute.  Hemoglobin previously noted to be within normal limits, but on  admission was 10.9.  No reports of bleeding.  Hemoglobin noted be 11.5 prior to discharge.  Hyperlipidemia: Last LDL noted to be 60.  Patient was not currently  on lovastatin.  He will need to follow-up with his primary care provider regarding starting on this medication.  Chronic kidney disease stage III: Stable. Creatinine noted to be 2.16-2.17 which appears near patient's baseline upon review of records  Obesity: BMI of 32.38.   MRSA positive screen: Mupirocin cream to complete course.  Follow UP    Consults obtained: None Discharge Condition: Stable  Diet and Activity recommendation: See Discharge Instructions below  Discharge Instructions    Discharge instructions   Complete by:  As directed    Follow with Primary MD Plotnikov, Evie Lacks, MD in 7 days.  The chest pain you experienced was suspect to be related to the musculoskeletal pain related with increased physical therapy.  There were no clear signs of heart stress or blockage noted on the work-up.  Check CBGs with meals and at bedtime.  During this hospitalization we noticed that your blood sugars were in the 50-60s early morning.  Therefore, your Prandin dosage was decreased to 0.25 mg orally 3 times daily.   Get CBC and BMP -  checked  by Primary MD or SNF MD in 5-7 days ( we routinely change or add medications that can affect your baseline labs and fluid status, therefore we recommend that you get the mentioned basic workup next visit with your PCP, your PCP may decide not to get them or add new tests based on their clinical decision)  Activity: As tolerated with fall precautions use walker/cane & assistance as needed  Disposition: [facility] skilled nursing facility  Diet: healthy/carb modified   Code: DNR     For Heart failure patients - Check your Weight same time everyday, if you gain over 2 pounds, or you develop in leg swelling, experience more shortness of breath or chest pain, call your Primary doctor  immediately. Follow Cardiac Low Salt Diet and 1.5 lit/day fluid restriction.  Special Instructions: If you have smoked or chewed Tobacco  in the last 2 yrs please stop smoking, stop any regular Alcohol  and or any Recreational drug use.  On your next visit with your primary care physician please Get Medicines reviewed and adjusted.  Please request your Plotnikov, Evie Lacks, MD to go over all Hospital Tests and Procedure/Radiological results at the follow up, please get all Hospital records sent to your Prim MD by signing hospital release before you go home.  If you experience worsening of your admission symptoms, develop shortness of breath, life threatening emergency, suicidal or homicidal thoughts you must seek medical attention immediately by calling 911 or calling your MD immediately  if symptoms less severe.  You Must read complete instructions/literature along with all the possible adverse reactions/side effects for all the Medicines you take and that have been prescribed to you. Take any new Medicines after you have completely understood and accpet all the possible adverse reactions/side effects.   Do not drive, operate heavy machinery, perform activities at heights, swimming or participation in water activities or provide baby sitting services if your were admitted for syncope or siezures until you have seen by Primary MD or a Neurologist and advised to do so again.  Do not drive when taking Pain medications.  Do not take more than prescribed Pain, Sleep and Anxiety Medications  Wear Seat belts while driving.   Please note  You were cared for by a hospitalist during your hospital stay. If you have any questions about your discharge medications or the care you received while you were in the hospital  after you are discharged, you can call the unit and asked to speak with the hospitalist on call if the hospitalist that took care of you is not available. Once you are discharged, your primary  care physician will handle any further medical issues. Please note that NO REFILLS for any discharge medications will be authorized once you are discharged, as it is imperative that you return to your primary care physician (or establish a relationship with a primary care physician if you do not have one) for your aftercare needs so that they can reassess your need for medications and monitor your lab values.        Discharge Medications     Allergies as of 03/10/2018      Reactions   Hydroxyzine Other (See Comments)   Visual hallucinations   Verapamil Other (See Comments)   Hallucinations    Benazepril Hcl Cough      Medication List    STOP taking these medications   lovastatin 20 MG tablet Commonly known as:  MEVACOR     TAKE these medications   ACCU-CHEK AVIVA PLUS w/Device Kit Used to check blood sugar BID. DX:E11.311   ACCU-CHEK SOFTCLIX LANCETS lancets Used to check blood sugar BID. DX:E11.311   acetaminophen 325 MG tablet Commonly known as:  TYLENOL Take 650 mg by mouth every 8 (eight) hours as needed for mild pain, fever or headache.   acetaminophen 500 MG tablet Commonly known as:  TYLENOL Take 500 mg by mouth 2 (two) times daily.   aspirin EC 81 MG tablet Take 1 tablet (81 mg total) by mouth daily.   atorvastatin 10 MG tablet Commonly known as:  LIPITOR Take 10 mg by mouth daily.   BIOFREEZE 4 % Gel Generic drug:  Menthol (Topical Analgesic) Apply 1 application topically every 2 (two) hours as needed (pain or soreness).   carboxymethylcellulose 1 % ophthalmic solution Place 1-2 drops into both eyes 4 (four) times daily.   cholecalciferol 25 MCG (1000 UT) tablet Commonly known as:  VITAMIN D Take 1 tablet (1,000 Units total) by mouth daily.   clopidogrel 75 MG tablet Commonly known as:  PLAVIX Take 1 tablet (75 mg total) by mouth daily.   cycloSPORINE 0.05 % ophthalmic emulsion Commonly known as:  RESTASIS Place 1 drop into both eyes 2 (two)  times daily.   docusate sodium 100 MG capsule Commonly known as:  COLACE Take 1 capsule (100 mg total) by mouth 2 (two) times daily.   eucerin cream Apply 1 application topically 2 (two) times daily as needed for dry skin.   finasteride 5 MG tablet Commonly known as:  PROSCAR Take 1 tablet (5 mg total) by mouth daily. For Prostate   FLUoxetine 20 MG tablet Commonly known as:  PROZAC Take 1 tablet (20 mg total) by mouth daily.   gabapentin 100 MG capsule Commonly known as:  NEURONTIN Take 1 capsule (100 mg total) 3 (three) times daily by mouth. For pain   glucose blood test strip Commonly known as:  ACCU-CHEK SMARTVIEW Used to check blood sugar BID. DX:E11.311   hydrALAZINE 25 MG tablet Commonly known as:  APRESOLINE Take 1 tablet (25 mg total) by mouth 3 (three) times daily. What changed:    medication strength  how much to take  when to take this   isosorbide mononitrate 30 MG 24 hr tablet Commonly known as:  IMDUR Take 1 tablet (30 mg total) by mouth daily.   losartan 100 MG tablet Commonly known as:  COZAAR Take 100 mg by mouth daily.   metoprolol succinate 100 MG 24 hr tablet Commonly known as:  TOPROL-XL Take 1 tablet (100 mg total) by mouth daily. Take with or immediately following a meal.   mupirocin ointment 2 % Commonly known as:  BACTROBAN Place into the nose 3 (three) times daily for 3 days. Apply to affected area 3 times daily   nitroGLYCERIN 0.4 MG SL tablet Commonly known as:  NITROSTAT Place 0.4 mg under the tongue every 5 (five) minutes as needed for chest pain.   PROTONIX 40 MG tablet Generic drug:  pantoprazole Take 1 tablet (40 mg total) by mouth daily.   ranitidine 150 MG tablet Commonly known as:  ZANTAC Take 1 tablet (150 mg total) by mouth at bedtime.   repaglinide 0.5 MG tablet Commonly known as:  PRANDIN Take 0.5 tablets (0.25 mg total) by mouth 3 (three) times daily before meals. What changed:    medication strength  how  much to take       Major procedures and Radiology Reports - PLEASE review detailed and final reports for all details, in brief -    Dg Chest 2 View  Result Date: 03/08/2018 CLINICAL DATA:  One-week history of LEFT-sided chest pain and shortness of breath. EXAM: CHEST - 2 VIEW COMPARISON:  10/11/2016 and earlier. FINDINGS: AP SEMI-ERECT and LATERAL images were obtained. Prior sternotomy for CABG. Cardiac silhouette upper normal in size for AP technique. Hilar and mediastinal contours unremarkable. Lungs clear. Bronchovascular markings normal. Pulmonary vascularity normal. No visible pleural effusions. No pneumothorax. Degenerative changes involving the thoracic and upper lumbar spine. IMPRESSION: No acute cardiopulmonary disease. Electronically Signed   By: Evangeline Dakin M.D.   On: 03/08/2018 17:31   Nm Pulmonary Perf And Vent  Result Date: 03/09/2018 CLINICAL DATA:  Intermittent chest pain, worse with inspiration. Mild shortness of breath. Clinical concern of pulmonary embolism. EXAM: NUCLEAR MEDICINE VENTILATION - PERFUSION LUNG SCAN TECHNIQUE: Ventilation images were obtained in multiple projections using inhaled aerosol Tc-36mDTPA. Perfusion images were obtained in multiple projections after intravenous injection of Tc-97mAA. RADIOPHARMACEUTICALS:  30.7 mCi of Tc-9993mPA aerosol inhalation and 4.39 mCi Tc99m40m IV COMPARISON:  Chest radiographs 03/08/2018 FINDINGS: Ventilation: No focal ventilation defect. Perfusion: Normal perfusion study, matched to the ventilatory examination. No evidence of pulmonary embolism. IMPRESSION: Normal examination.  No evidence of pulmonary embolism. Electronically Signed   By: WillRichardean Sale.   On: 03/09/2018 13:35   Vas Us L[place]er Extremity Venous (dvt)  Result Date: 03/09/2018  Lower Venous Study Indications: Pain.  Comparison Study: No prior venous study on file Performing Technologist: CandSharion Dove  Examination Guidelines: A complete  evaluation includes B-mode imaging, spectral Doppler, color Doppler, and power Doppler as needed of all accessible portions of each vessel. Bilateral testing is considered an integral part of a complete examination. Limited examinations for reoccurring indications may be performed as noted.  Right Venous Findings: +---+---------------+---------+-----------+----------+-------+    CompressibilityPhasicitySpontaneityPropertiesSummary +---+---------------+---------+-----------+----------+-------+ CFVFull           Yes      Yes                          +---+---------------+---------+-----------+----------+-------+  Left Venous Findings: +---------+---------------+---------+-----------+----------+-------+          CompressibilityPhasicitySpontaneityPropertiesSummary +---------+---------------+---------+-----------+----------+-------+ CFV      Full           Yes      Yes                          +---------+---------------+---------+-----------+----------+-------+  SFJ      Full                                                 +---------+---------------+---------+-----------+----------+-------+ FV Prox  Full                                                 +---------+---------------+---------+-----------+----------+-------+ FV Mid   Full                                                 +---------+---------------+---------+-----------+----------+-------+ FV DistalFull                                                 +---------+---------------+---------+-----------+----------+-------+ PFV      Full                                                 +---------+---------------+---------+-----------+----------+-------+ POP      Full           Yes      Yes                          +---------+---------------+---------+-----------+----------+-------+ PTV      Full                                                  +---------+---------------+---------+-----------+----------+-------+ PERO     Full                                                 +---------+---------------+---------+-----------+----------+-------+    Summary: Right: No evidence of common femoral vein obstruction. Left: There is no evidence of deep vein thrombosis in the lower extremity. Sluggish flow noted  *See table(s) above for measurements and observations. Electronically signed by Ruta Hinds MD on 03/09/2018 at 2:15:16 PM.    Final     Micro Results   Recent Results (from the past 240 hour(s))  MRSA PCR Screening     Status: Abnormal   Collection Time: 03/08/18 11:31 PM  Result Value Ref Range Status   MRSA by PCR POSITIVE (A) NEGATIVE Final    Comment: CRITICAL RESULT CALLED TO, READ BACK BY AND VERIFIED WITH: RN, Rosina Lowenstein [phone] '@[id number]'  THANEY Performed at [facility], [place] [address], [place], [place] [address]        Today   Subjective    Branson Kranz today reports that he has had no recurrence of chest pain.   Objective   Blood pressure 138/62, pulse 71,  temperature 98 F (36.7 C), temperature source Oral, resp. rate 16, height '5\' 6"'  (1.676 m), weight 91 kg, SpO2 97 %.   Intake/Output Summary (Last 24 hours) at 03/10/2018 1118 Last data filed at 03/10/2018 0827 Gross per 24 hour  Intake 470 ml  Output 200 ml  Net 270 ml    Exam  Constitutional: NAD, calm, comfortable Eyes: Legally blind ENMT: Mucous membranes are moist. Posterior pharynx clear of any exudate or lesions.  Neck: normal, supple, no masses, no thyromegaly Respiratory: clear to auscultation bilaterally, no wheezing, no crackles. Normal respiratory effort. No accessory muscle use.  Cardiovascular: Regular rate and rhythm, no murmurs / rubs / gallops. No extremity edema. 2+ pedal pulses. No carotid bruits.  Abdomen: no tenderness, no masses palpated. No hepatosplenomegaly. Bowel sounds positive.  Musculoskeletal: no clubbing /  cyanosis. No joint deformity upper and lower extremities. Good ROM, no contractures. Normal muscle tone.  Skin: no rashes, lesions, ulcers. No induration Neurologic: CN 2-12 grossly intact. Sensation intact, DTR normal. Strength 5/5 in all 4.  Psychiatric: Normal judgment and insight. Alert and oriented x 3. Normal mood.    Data Review   CBC w Diff:  Lab Results  Component Value Date   WBC 8.7 03/10/2018   HGB 11.5 (L) 03/10/2018   HCT 34.4 (L) 03/10/2018   PLT 206 03/10/2018   LYMPHOPCT 10 03/08/2018   MONOPCT 9 03/08/2018   EOSPCT 0 03/08/2018   BASOPCT 0 03/08/2018    CMP:  Lab Results  Component Value Date   NA 136 03/10/2018   K 3.9 03/10/2018   CL 106 03/10/2018   CO2 20 (L) 03/10/2018   BUN 28 (H) 03/10/2018   CREATININE 2.16 (H) 03/10/2018   PROT 7.0 03/08/2018   ALBUMIN 2.8 (L) 03/08/2018   BILITOT 0.6 03/08/2018   ALKPHOS 62 03/08/2018   AST 17 03/08/2018   ALT 17 03/08/2018  .   Total Time in preparing paper work, data evaluation and todays exam - 35 minutes  Norval Morton M.D on 03/10/2018 at 11:18 AM  [facility]   Office  [phone]

## 2018-03-10 NOTE — Clinical Social Work Note (Signed)
Clinical Social Worker facilitated patient discharge including contacting patient family and facility to confirm patient discharge plans.  Clinical information faxed to facility and family agreeable with plan.  CSW arranged ambulance transport via [facility] to [facility].  RN to call [phone] for report prior to discharge.  Clinical Social Worker will sign off for now as social work intervention is no longer needed. Please consult [place] again if new need arises.  [place], [place]

## 2018-03-10 NOTE — NC FL2 (Signed)
[place] LEVEL OF CARE SCREENING TOOL     IDENTIFICATION  Patient Name: Caleb Tornow Sr. Birthdate: January 26, 1940 Sex: male Admission Date (Current Location): 03/08/2018  [facility] and [place] Number:  Herbalist and Address:  [facility] [place], [place] [address] [place], [place] [address]      Provider Number: [id number]  Attending Physician Name and Address:  Norval Morton, MD  Relative Name and Phone Number:       Current Level of Care: Hospital Recommended Level of Care: [place] Prior Approval Number:    Date Approved/Denied:   PASRR Number:    Discharge Plan: SNF    Current Diagnoses: Patient Active Problem List   Diagnosis Date Noted  . CAD (coronary artery disease) of artery bypass graft 03/10/2018  . Diastolic dysfunction 67/12/4578  . Anemia of chronic disease 03/10/2018  . Atypical chest pain 03/08/2018  . Scrotum pain 11/30/2016  . Diabetes mellitus with complication ([place])   . Groin pain, right 09/04/2016  . Fall   . History of stroke   . AKI (acute kidney injury) ([place])   . Type 2 diabetes mellitus with peripheral neuropathy (HCC)   . Labile blood pressure   . CKD (chronic kidney disease), stage III ([place])   . Essential hypertension   . Blind   . 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 Acute cerebrovascular accident (CVA) ([place]) 03/13/2016  . Pruritus 01/03/2016  . Impacted cerumen of left ear 10/03/2015  . Situational mixed anxiety and depressive disorder 06/17/2015  . Neuropathic pain 03/09/2015  . Insomnia 03/09/2015  . Left arm weakness 08/13/2014  . Herpes zoster 07/13/2014  . Constipation 07/13/2014  . Epididymitis 02/23/2014  . Pain in joint, ankle and foot 08/04/2013  . Dry mouth 05/05/2013  . 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 Obesity (BMI 30-39.9)   . Chronic kidney disease (CKD), stage IV (severe) ([place])   . Elevated PSA 08/17/2011  . Bladder neck obstruction  04/13/2011  . DM (diabetes mellitus), type 2 with ophthalmic complications ([place])   . Glaucoma associated with ocular disorder   . Carpal tunnel sundrome   . Erectile dysfunction   . GERD   . Cervical disc disorder with radiculopathy   . Hyperlipidemia   . Cholelithiases   . Hypertensive heart disease   . Coronary atherosclerosis     Orientation RESPIRATION BLADDER Height & Weight     Self, Time, Situation, Place  Normal External catheter, Incontinent(placed 2/15) Weight: 200 lb 9.9 oz (91 kg) Height:  5\' 6"  (167.6 cm)  BEHAVIORAL SYMPTOMS/MOOD NEUROLOGICAL BOWEL NUTRITION STATUS      Incontinent Diet(heart healthy/carb modified, thin liquids)  AMBULATORY STATUS COMMUNICATION OF NEEDS Skin   Limited Assist Verbally Normal                       Personal Care Assistance Level of Assistance  Feeding, Bathing, Dressing Bathing Assistance: Limited assistance Feeding assistance: Independent Dressing Assistance: Limited assistance     Functional Limitations Info  Hearing, Speech, Sight Sight Info: Adequate Hearing Info: Adequate Speech Info: Adequate    SPECIAL CARE FACTORS FREQUENCY  PT (By licensed PT), OT (By licensed OT)                    Contractures Contractures Info: Not present    Additional Factors Info  Code Status, Allergies Code Status Info: DNR Allergies Info: Hydroxyzine, Verapamil, Benazepril Hcl  Current Medications (03/10/2018):  This is the current hospital active medication list Current Facility-Administered Medications  Medication Dose Route Frequency Provider Last Rate Last Dose  . acetaminophen (TYLENOL) tablet 500 mg  500 mg Oral BID Guilford Shi, MD   500 mg at 03/10/18 5003  . acetaminophen (TYLENOL) tablet 650 mg  650 mg Oral Q8H PRN Guilford Shi, MD      . aspirin EC tablet 81 mg  81 mg Oral Daily Guilford Shi, MD   81 mg at 03/10/18 7048  . atorvastatin (LIPITOR) tablet 10 mg  10 mg Oral Daily Guilford Shi, MD   10 mg at 03/10/18 8891  . Chlorhexidine Gluconate Cloth 2 % PADS 6 each  6 each Topical Q0600 Vann, Jessica U, DO      . cholecalciferol (VITAMIN D) tablet 1,000 Units  1,000 Units Oral Daily Guilford Shi, MD   1,000 Units at 03/10/18 0820  . clopidogrel (PLAVIX) tablet 75 mg  75 mg Oral Daily Guilford Shi, MD   75 mg at 03/10/18 6945  . cycloSPORINE (RESTASIS) 0.05 % ophthalmic emulsion 1 drop  1 drop Both Eyes BID Guilford Shi, MD   1 drop at 03/10/18 0824  . docusate sodium (COLACE) capsule 100 mg  100 mg Oral BID Guilford Shi, MD   100 mg at 03/10/18 0388  . enoxaparin (LOVENOX) injection 40 mg  40 mg Subcutaneous Q24H Guilford Shi, MD   40 mg at 03/09/18 2059  . famotidine (PEPCID) tablet 20 mg  20 mg Oral Daily Guilford Shi, MD   20 mg at 03/10/18 8280  . finasteride (PROSCAR) tablet 5 mg  5 mg Oral Daily Guilford Shi, MD   5 mg at 03/10/18 0820  . FLUoxetine (PROZAC) capsule 20 mg  20 mg Oral Daily Guilford Shi, MD   20 mg at 03/10/18 0349  . gabapentin (NEURONTIN) capsule 100 mg  100 mg Oral TID Guilford Shi, MD   100 mg at 03/10/18 0820  . hydrALAZINE (APRESOLINE) injection 10 mg  10 mg Intravenous Q6H PRN Guilford Shi, MD      . hydrALAZINE (APRESOLINE) tablet 25 mg  25 mg Oral Q8H Kamineni, Lamount Cranker, MD   25 mg at 03/09/18 2317  . hydrocerin (EUCERIN) cream 1 application  1 application Topical BID PRN Guilford Shi, MD      . insulin aspart (novoLOG) injection 0-9 Units  0-9 Units Subcutaneous TID WC Kamineni, Neelima, MD      . isosorbide mononitrate (IMDUR) 24 hr tablet 30 mg  30 mg Oral Daily Guilford Shi, MD   30 mg at 03/10/18 0820  . losartan (COZAAR) tablet 100 mg  100 mg Oral Daily Guilford Shi, MD   100 mg at 03/10/18 1791  . metoprolol succinate (TOPROL-XL) 24 hr tablet 100 mg  100 mg Oral Daily Guilford Shi, MD   100 mg at 03/10/18 5056  . mupirocin ointment (BACTROBAN) 2 % 1 application  1  application Nasal BID Geradine Girt, DO   1 application at 97/94/80 0910  . nitroGLYCERIN (NITROSTAT) SL tablet 0.4 mg  0.4 mg Sublingual Q5 min PRN Guilford Shi, MD      . ondansetron (ZOFRAN) tablet 4 mg  4 mg Oral Q6H PRN Guilford Shi, MD       Or  . ondansetron (ZOFRAN) injection 4 mg  4 mg Intravenous Q6H PRN Kamineni, Neelima, MD      . oxyCODONE (Oxy IR/ROXICODONE) immediate release tablet 5 mg  5 mg Oral Q4H  PRN Guilford Shi, MD      . pantoprazole (PROTONIX) EC tablet 40 mg  40 mg Oral Daily Guilford Shi, MD   40 mg at 03/10/18 2346  . polyethylene glycol (MIRALAX / GLYCOLAX) packet 17 g  17 g Oral Daily PRN Guilford Shi, MD      . polyvinyl alcohol (LIQUIFILM TEARS) 1.4 % ophthalmic solution 1-2 drop  1-2 drop Both Eyes QID Guilford Shi, MD   2 drop at 03/10/18 0823  . repaglinide (PRANDIN) tablet 0.25 mg  0.25 mg Oral TID AC Norval Morton, MD         Discharge Medications: Please see discharge summary for a list of discharge medications.  Relevant Imaging Results:  Relevant Lab Results:   Additional Information SSN: [id number]  Eileen Stanford, LCSW

## 2018-03-10 NOTE — Progress Notes (Signed)
Update provided to family at bedside per pt request  MD stated he is working on discharge  Called social worker to inform

## 2018-03-10 NOTE — Progress Notes (Signed)
NT rechecked CBG, WNL  NT assisting pt eat breakfast  Will continue to monitor

## 2018-03-10 NOTE — Clinical Social Work Note (Signed)
Pt to return to [facility] today. Admission coordinator is looking for a single room to put pt in given he is in isolation. CSW awaiting call back before pt can d/c.   [place], [place]

## 2018-03-10 NOTE — Progress Notes (Signed)
Pt NP from facility, [facility], would like to be contacted at [phone] when discharge orders are placed

## 2018-03-11 ENCOUNTER — Telehealth: Payer: Self-pay | Admitting: *Deleted

## 2018-03-11 NOTE — Telephone Encounter (Signed)
Pt was on TCM report admitted 03/08/18 for Atypical chest pain. Patient presented with complaints of pleuritic-like chest pain.  He had been getting increased physical therapy at [facility] facility.  EKG without significant ischemic changes similar to previous.  Troponins remained negative, but patient was found to have a mildly elevated d-dimer.  Vascular Doppler ultrasound of bilateral lower extremities showed no signs of a DVT and VQ scan was negative for any signs of a pulmonary embolus. Pt D/C 03/10/18 to SNF. Per summary will nee to see PCP 1 week after leaving SNF.Marland KitchenJohny Chess

## 2018-08-15 ENCOUNTER — Ambulatory Visit: Payer: Medicare Other

## 2019-06-05 ENCOUNTER — Ambulatory Visit (INDEPENDENT_AMBULATORY_CARE_PROVIDER_SITE_OTHER): Payer: Medicare Other

## 2019-06-05 DIAGNOSIS — Z0000 Encounter for general adult medical examination without abnormal findings: Secondary | ICD-10-CM | POA: Diagnosis not present

## 2019-06-05 NOTE — Progress Notes (Signed)
I connected with [name] today by telephone and verified that I am speaking with the correct person using two identifiers. Location patient: nursing home Location provider: work Persons participating in the virtual visit: patient, Lisette Abu, LPN ([place])   I discussed the limitations, risks, security and privacy concerns of performing an evaluation and management service by telephone and the availability of in person appointments. I also discussed with the patient that there may be a patient responsible charge related to this service. The patient expressed understanding and verbally consented to this telephonic visit.    Interactive audio and video telecommunications were attempted between this provider and patient, however failed, due to patient having technical difficulties OR patient did not have access to video capability.  We continued and completed visit with audio only.  Some vital signs may be absent or patient reported.   Time Spent with patient on telephone encounter: 45 minutes  Subjective:   Caleb Fehnel Sr. is a 79 y.o. male who presents for Medicare Annual/Subsequent preventive examination.  Review of Systems:  No ROS. Medicare Wellness Virtual Visit. Additional risk factors are reflected in social history. Cardiac Risk Factors include: advanced age (>15mn, >>22women);diabetes mellitus;dyslipidemia;family history of premature cardiovascular disease;hypertension;male gender Sleep Patterns: No sleep issues, feels rested on waking and sleeps 8 hours nightly. Home Safety/Smoke Alarms: Feels safe in home; uses home alarm. Smoke alarms in place. Living environment: 1-story homes; Lives with spouse; no needs for DME; good support system. Seat Belt Safety/Bike Helmet: Wears seat belt.    Objective:    Vitals: There were no vitals taken for this visit.  There is no height or weight on file to calculate BMI.  Advanced Directives 06/05/2019  03/08/2018 03/08/2018 11/11/2016 10/11/2016 10/11/2016 05/18/2016  Does Patient Have a Medical Advance Directive? Yes Yes No No No No Yes  Type of Advance Directive Healthcare Power of [place] D[place] (pink MOST or yellow form) - - - - Out of facility DNR (pink MOST or yellow form)  Does patient want to make changes to medical advance directive? No - Patient declined No - Patient declined - - - - No - Patient declined  Copy of [place] Chart? No - copy requested - - - - - -  Would patient like information on creating a medical advance directive? - No - Patient declined Yes (ED - Information included in AVS) No - Patient declined No - Patient declined - -  Pre-existing out of facility DNR order (yellow form or pink MOST form) - Yellow form placed in chart (order not valid for inpatient use) - - - - -    Tobacco Social History   Tobacco Use  Smoking Status Former Smoker  . Packs/day: 2.00  . Years: 35.00  . Pack years: 70.00  . Types: Cigarettes  . Quit date: 173 . Years since quitting: 30.3  Smokeless Tobacco Never Used     Counseling given: Not Answered   Clinical Intake:  Pre-visit preparation completed: Yes  Pain : No/denies pain     Diabetes: Yes CBG done?: No Did pt. bring in CBG monitor from home?: No  How often do you need to have someone help you when you read instructions, pamphlets, or other written materials from your doctor or pharmacy?: 3 - Sometimes What is the last grade level you completed in school?: 10th grade  Interpreter Needed?: No  Information entered by :: Shenika N. HLowell Guitar LPN  Past Medical History:  Diagnosis Date  . Blind in both eyes   . CAD (coronary artery disease)   . Cholelithiasis   . CTS (carpal tunnel syndrome)    Left  . CVA (cerebral vascular accident) [facility]) ~ 02/2016   "dr says my left side is weaker now; I can'r tell" (10/11/2016)  . DM type 2 with diabetic peripheral neuropathy ([place])   . ED  (erectile dysfunction)   . Elevated PSA   . GERD (gastroesophageal reflux disease)   . Glaucoma, both eyes   . High cholesterol   . HTN (hypertension)   . Inferior MI ([place])    hx/notes 06/06/2010  . 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 MI (myocardial infarction) ([place]) 1991  . Thyroid nodule   . Type II diabetes mellitus ([place])    Past Surgical History:  Procedure Laterality Date  . CORONARY ANGIOPLASTY WITH STENT PLACEMENT  04/26/2000   Archie Endo 06/06/2010  . CORONARY ARTERY BYPASS GRAFT  2002   Archie Endo 06/06/2010  . EYE SURGERY Bilateral    "did laser on them"  . GLAUCOMA SURGERY Bilateral    several/notes 06/06/2010  . LAPAROSCOPIC CHOLECYSTECTOMY  10/2004   Archie Endo 06/06/2010  . NECK MASS EXCISION Left    "benign"  . VITRECTOMY Right 05/01/2004   Archie Endo 06/06/2010   Family History  Problem Relation Age of Onset  . Hypertension Mother   . Kidney disease Father   . Hypertension Father   . Coronary artery disease Other        1st degree Male relative <60  . Diabetes Other        1st degree relative   Social History   Socioeconomic History  . Marital status: Widowed    Spouse name: Not on file  . Number of children: Not on file  . Years of education: Not on file  . Highest education level: Not on file  Occupational History  . Occupation: Retired  Tobacco Use  . Smoking status: Former Smoker    Packs/day: 2.00    Years: 35.00    Pack years: 70.00    Types: Cigarettes    Quit date: 1991    Years since quitting: 30.3  . Smokeless tobacco: Never Used  Substance and Sexual Activity  . Alcohol use: No  . Drug use: No  . Sexual activity: Never  Other Topics Concern  . Not on file  Social History Narrative   Regular Exercise-No   Married, but separated   Social Determinants of Health   Financial Resource Strain:   . Difficulty of Paying Living Expenses:   Food Insecurity:   . Worried About Charity fundraiser in the Last Year:   . Arboriculturist  in the Last Year:   Transportation Needs:   . Film/video editor (Medical):   Marland Kitchen Lack of Transportation (Non-Medical):   Physical Activity:   . Days of Exercise per Week:   . Minutes of Exercise per Session:   Stress:   . Feeling of Stress :   Social Connections:   . Frequency of Communication with Friends and Family:   . Frequency of Social Gatherings with Friends and Family:   . Attends Religious Services:   . Active Member of Clubs or Organizations:   . Attends Archivist Meetings:   Marland Kitchen Marital Status:     Outpatient Encounter Medications as of 06/05/2019  Medication Sig  . ACCU-CHEK SOFTCLIX LANCETS lancets Used to check  blood sugar BID. DX:E11.311  . acetaminophen (TYLENOL) 325 MG tablet Take 650 mg by mouth every 8 (eight) hours as needed for mild pain, fever or headache.  Marland Kitchen acetaminophen (TYLENOL) 500 MG tablet Take 500 mg by mouth 2 (two) times daily.  Marland Kitchen aspirin 81 MG tablet Take 1 tablet (81 mg total) by mouth daily.  Marland Kitchen atorvastatin (LIPITOR) 10 MG tablet Take 10 mg by mouth daily.  . Blood Glucose Monitoring Suppl (ACCU-CHEK AVIVA PLUS) w/Device KIT Used to check blood sugar BID. DX:E11.311  . carboxymethylcellulose 1 % ophthalmic solution Place 1-2 drops into both eyes 4 (four) times daily.  . cholecalciferol (VITAMIN D) 1000 units tablet Take 1 tablet (1,000 Units total) by mouth daily.  . clopidogrel (PLAVIX) 75 MG tablet Take 1 tablet (75 mg total) by mouth daily.  . cycloSPORINE (RESTASIS) 0.05 % ophthalmic emulsion Place 1 drop into both eyes 2 (two) times daily.  Marland Kitchen docusate sodium (COLACE) 100 MG capsule Take 1 capsule (100 mg total) by mouth 2 (two) times daily.  . finasteride (PROSCAR) 5 MG tablet Take 1 tablet (5 mg total) by mouth daily. For Prostate  . FLUoxetine (PROZAC) 20 MG tablet Take 1 tablet (20 mg total) by mouth daily.  Marland Kitchen gabapentin (NEURONTIN) 100 MG capsule Take 1 capsule (100 mg total) 3 (three) times daily by mouth. For pain  . glucose  blood (ACCU-CHEK SMARTVIEW) test strip Used to check blood sugar BID. DX:E11.311  . hydrALAZINE (APRESOLINE) 25 MG tablet Take 1 tablet (25 mg total) by mouth 3 (three) times daily.  . isosorbide mononitrate (IMDUR) 30 MG 24 hr tablet Take 1 tablet (30 mg total) by mouth daily.  Marland Kitchen losartan (COZAAR) 100 MG tablet Take 100 mg by mouth daily.  . Menthol, Topical Analgesic, (BIOFREEZE) 4 % GEL Apply 1 application topically every 2 (two) hours as needed (pain or soreness).  . metoprolol succinate (TOPROL-XL) 100 MG 24 hr tablet Take 1 tablet (100 mg total) by mouth daily. Take with or immediately following a meal.  . nitroGLYCERIN (NITROSTAT) 0.4 MG SL tablet Place 0.4 mg under the tongue every 5 (five) minutes as needed for chest pain.  . pantoprazole (PROTONIX) 40 MG tablet Take 1 tablet (40 mg total) by mouth daily.  . ranitidine (ZANTAC) 150 MG tablet Take 1 tablet (150 mg total) by mouth at bedtime.  . repaglinide (PRANDIN) 0.5 MG tablet Take 0.5 tablets (0.25 mg total) by mouth 3 (three) times daily before meals.  . Skin Protectants, Misc. (EUCERIN) cream Apply 1 application topically 2 (two) times daily as needed for dry skin.   No facility-administered encounter medications on file as of 06/05/2019.    Activities of Daily Living In your present state of health, do you have any difficulty performing the following activities: 06/05/2019  Hearing? N  Vision? Y  Comment legally blind in both eyes  Difficulty concentrating or making decisions? N  Walking or climbing stairs? Y  Dressing or bathing? Y  Doing errands, shopping? Y  Preparing Food and eating ? Y  Using the Toilet? Y  In the past six months, have you accidently leaked urine? Y  Do you have problems with loss of bowel control? Y  Managing your Medications? Y  Managing your Finances? Y  Comment Daughter is Doctor, hospital? Y  Comment In Nursing Home  Some recent data might be hidden     Patient Care Team: Plotnikov, Evie Lacks, MD as PCP -  General (Internal Medicine) Jacolyn Reedy, MD (Cardiology)   Assessment:   This is a routine wellness examination for [place].  Exercise Activities and Dietary recommendations Current Exercise Habits: The patient does not participate in regular exercise at present, Exercise limited by: Other - see comments(not able to do exercises in nursing home due to + Covid cases)  Goals   None     Fall Risk Fall Risk  06/05/2019 10/19/2016 04/11/2016 04/22/2015 04/21/2014  Falls in the past year? 1 Yes Yes No Yes  Number falls in past yr: '1 1 1 ' - 1  Injury with Fall? 1 No Yes - No  Risk for fall due to : History of fall(s);Impaired balance/gait;Impaired vision;Impaired mobility - Mental status change;Impaired vision - -  Risk for fall due to: Comment legally blind in both eyes - - - -  Follow up Falls evaluation completed;Falls prevention discussed - Education provided;Falls prevention discussed - -   Is the patient's home free of loose throw rugs in walkways, pet beds, electrical cords, etc?   yes      Grab bars in the bathroom? yes      Handrails on the stairs?   yes      Adequate lighting?   yes   Depression Screen PHQ 2/9 Scores 06/05/2019 10/19/2016 04/11/2016 04/22/2015  PHQ - 2 Score 1 0 0 0  PHQ- 9 Score - - 0 -    Cognitive Function: refused        Immunization History  Administered Date(s) Administered  . Influenza Split 12/27/2010, 11/01/2011  . Influenza Whole 10/15/2008, 11/18/2009  . Influenza, High Dose Seasonal PF 10/03/2015  . Influenza,inj,Quad PF,6+ Mos 10/03/2012, 11/06/2013, 11/23/2014  . PPD Test 05/17/2016  . Pneumococcal Conjugate-13 01/02/2013  . Pneumococcal Polysaccharide-23 09/04/2005, 05/13/2017  . Tdap 12/27/2010    Qualifies for Shingles Vaccine?  Yes  Screening Tests Health Maintenance  Topic Date Due  . OPHTHALMOLOGY EXAM  Never done  . COVID-19 Vaccine (1) Never done  . FOOT EXAM   01/21/2015  . INFLUENZA VACCINE  08/23/2019  . TETANUS/TDAP  12/26/2020  . PNA vac Low Risk Adult  Completed   Cancer Screenings: Lung: Low Dose CT Chest recommended if Age 43-80 years, 30 pack-year currently smoking OR have quit w/in 15years. Patient does qualify. Colorectal: not recommended due to age     Plan:     Reviewed health maintenance screenings with patient today and relevant education, vaccines, and/or referrals were provided.    Continue doing brain stimulating activities (puzzles, reading, adult coloring books, staying active) to keep memory sharp.    Continue to eat heart healthy diet (full of fruits, vegetables, whole grains, lean protein, water--limit salt, fat, and sugar intake) and increase physical activity as tolerated.  I have personally reviewed and noted the following in the patient's chart:   . Medical and social history . Use of alcohol, tobacco or illicit drugs  . Current medications and supplements . Functional ability and status . Nutritional status . Physical activity . Advanced directives . List of other physicians . Hospitalizations, surgeries, and ER visits in previous 12 months . Vitals . Screenings to include cognitive, depression, and falls . Referrals and appointments  In addition, I have reviewed and discussed with patient certain preventive protocols, quality metrics, and best practice recommendations. A written personalized care plan for preventive services as well as general preventive health recommendations were provided to patient.     Sheral Flow, LPN  4/58/0998  Nurse Health  Advisor  Nurse Notes: There were no vitals filed for this visit. There is no height or weight on file to calculate BMI.

## 2020-07-19 ENCOUNTER — Encounter: Payer: Medicare Other | Admitting: Internal Medicine

## 2020-07-19 ENCOUNTER — Ambulatory Visit: Payer: Medicare Other

## 2020-07-26 ENCOUNTER — Encounter: Payer: Medicare Other | Admitting: Internal Medicine

## 2020-07-26 ENCOUNTER — Ambulatory Visit: Payer: Medicare Other

## 2020-08-01 ENCOUNTER — Ambulatory Visit: Payer: Medicare Other

## 2020-08-29 ENCOUNTER — Encounter (HOSPITAL_COMMUNITY): Payer: Self-pay

## 2020-08-29 ENCOUNTER — Emergency Department (HOSPITAL_COMMUNITY)

## 2020-08-29 ENCOUNTER — Emergency Department (HOSPITAL_COMMUNITY)
Admission: EM | Admit: 2020-08-29 | Discharge: 2020-08-29 | Disposition: A | Discharge status: Home / Self Care | Attending: Emergency Medicine | Admitting: Emergency Medicine

## 2020-08-29 ENCOUNTER — Encounter: Payer: Self-pay | Admitting: Emergency Medicine

## 2020-08-29 ENCOUNTER — Other Ambulatory Visit: Payer: Self-pay

## 2020-08-29 DIAGNOSIS — E875 Hyperkalemia: Secondary | ICD-10-CM

## 2020-08-29 DIAGNOSIS — S01511A Laceration without foreign body of lip, initial encounter: Secondary | ICD-10-CM

## 2020-08-29 DIAGNOSIS — Z515 Encounter for palliative care: Secondary | ICD-10-CM

## 2020-08-29 DIAGNOSIS — N184 Chronic kidney disease, stage 4 (severe): Secondary | ICD-10-CM

## 2020-08-29 DIAGNOSIS — S065X9A Traumatic subdural hemorrhage with loss of consciousness of unspecified duration, initial encounter: Secondary | ICD-10-CM

## 2020-08-29 DIAGNOSIS — F0390 Unspecified dementia without behavioral disturbance: Secondary | ICD-10-CM | POA: Insufficient documentation

## 2020-08-29 DIAGNOSIS — T1490XA Injury, unspecified, initial encounter: Secondary | ICD-10-CM

## 2020-08-29 DIAGNOSIS — W19XXXA Unspecified fall, initial encounter: Secondary | ICD-10-CM

## 2020-08-29 DIAGNOSIS — K029 Dental caries, unspecified: Secondary | ICD-10-CM

## 2020-08-29 DIAGNOSIS — S065X0A Traumatic subdural hemorrhage without loss of consciousness, initial encounter: Secondary | ICD-10-CM | POA: Diagnosis not present

## 2020-08-29 DIAGNOSIS — Y900 Blood alcohol level of less than 20 mg/100 ml: Secondary | ICD-10-CM | POA: Insufficient documentation

## 2020-08-29 DIAGNOSIS — W01198A Fall on same level from slipping, tripping and stumbling with subsequent striking against other object, initial encounter: Secondary | ICD-10-CM | POA: Insufficient documentation

## 2020-08-29 DIAGNOSIS — Z7982 Long term (current) use of aspirin: Secondary | ICD-10-CM | POA: Insufficient documentation

## 2020-08-29 DIAGNOSIS — Z79899 Other long term (current) drug therapy: Secondary | ICD-10-CM | POA: Diagnosis not present

## 2020-08-29 DIAGNOSIS — E119 Type 2 diabetes mellitus without complications: Secondary | ICD-10-CM | POA: Insufficient documentation

## 2020-08-29 DIAGNOSIS — S0993XA Unspecified injury of face, initial encounter: Secondary | ICD-10-CM | POA: Diagnosis present

## 2020-08-29 DIAGNOSIS — Z20822 Contact with and (suspected) exposure to covid-19: Secondary | ICD-10-CM | POA: Diagnosis not present

## 2020-08-29 DIAGNOSIS — S065XAA Traumatic subdural hemorrhage with loss of consciousness status unknown, initial encounter: Secondary | ICD-10-CM

## 2020-08-29 DIAGNOSIS — Z7902 Long term (current) use of antithrombotics/antiplatelets: Secondary | ICD-10-CM | POA: Insufficient documentation

## 2020-08-29 DIAGNOSIS — I10 Essential (primary) hypertension: Secondary | ICD-10-CM | POA: Diagnosis not present

## 2020-08-29 HISTORY — DX: Essential (primary) hypertension: I10

## 2020-08-29 HISTORY — DX: Unspecified dementia, unspecified severity, without behavioral disturbance, psychotic disturbance, mood disturbance, and anxiety: F03.90

## 2020-08-29 HISTORY — DX: Type 2 diabetes mellitus without complications: E11.9

## 2020-08-29 HISTORY — DX: Unspecified dementia without behavioral disturbance: F03.90

## 2020-08-29 LAB — COMPREHENSIVE METABOLIC PANEL
ALT: 23 U/L (ref 0–44)
AST: 23 U/L (ref 15–41)
Albumin: 3.4 g/dL — ABNORMAL LOW (ref 3.5–5.0)
Alkaline Phosphatase: 85 U/L (ref 38–126)
Anion gap: 7 (ref 5–15)
BUN: 48 mg/dL — ABNORMAL HIGH (ref 8–23)
CO2: 19 mmol/L — ABNORMAL LOW (ref 22–32)
Calcium: 9 mg/dL (ref 8.9–10.3)
Chloride: 104 mmol/L (ref 98–111)
Creatinine, Ser: 3.36 mg/dL — ABNORMAL HIGH (ref 0.61–1.24)
GFR, Estimated: 18 mL/min — ABNORMAL LOW (ref >60)
Glucose, Bld: 111 mg/dL — ABNORMAL HIGH (ref 70–99)
Potassium: 7.1 mmol/L (ref 3.5–5.1)
Sodium: 130 mmol/L — ABNORMAL LOW (ref 135–145)
Total Bilirubin: 0.6 mg/dL (ref 0.3–1.2)
Total Protein: 7.4 g/dL (ref 6.5–8.1)

## 2020-08-29 LAB — BASIC METABOLIC PANEL
Anion gap: 8 (ref 5–15)
BUN: 52 mg/dL — ABNORMAL HIGH (ref 8–23)
CO2: 19 mmol/L — ABNORMAL LOW (ref 22–32)
Calcium: 9 mg/dL (ref 8.9–10.3)
Chloride: 106 mmol/L (ref 98–111)
Creatinine, Ser: 3.33 mg/dL — ABNORMAL HIGH (ref 0.61–1.24)
GFR, Estimated: 18 mL/min — ABNORMAL LOW (ref >60)
Glucose, Bld: 106 mg/dL — ABNORMAL HIGH (ref 70–99)
Potassium: 6.6 mmol/L (ref 3.5–5.1)
Sodium: 133 mmol/L — ABNORMAL LOW (ref 135–145)

## 2020-08-29 LAB — PROTIME-INR
INR: 1.1 (ref 0.8–1.2)
Prothrombin Time: 13.8 seconds (ref 11.4–15.2)

## 2020-08-29 LAB — RESP PANEL BY RT-PCR (FLU A&B, COVID) ARPGX2
Influenza A by PCR: NEGATIVE
Influenza B by PCR: NEGATIVE
SARS Coronavirus 2 by RT PCR: NEGATIVE

## 2020-08-29 LAB — CBC
HCT: 40.9 % (ref 39.0–52.0)
Hemoglobin: 12.7 g/dL — ABNORMAL LOW (ref 13.0–17.0)
MCH: 28.5 pg (ref 26.0–34.0)
MCHC: 31.1 g/dL (ref 30.0–36.0)
MCV: 91.7 fL (ref 80.0–100.0)
Platelets: 275 10*3/uL (ref 150–400)
RBC: 4.46 MIL/uL (ref 4.22–5.81)
RDW: 13.9 % (ref 11.5–15.5)
WBC: 6.1 10*3/uL (ref 4.0–10.5)
nRBC: 0 % (ref 0.0–0.2)

## 2020-08-29 LAB — SAMPLE TO BLOOD BANK

## 2020-08-29 LAB — ETHANOL: Alcohol, Ethyl (B): <10 mg/dL (ref <10)

## 2020-08-29 LAB — LACTIC ACID, PLASMA: Lactic Acid, Venous: 1.1 mmol/L (ref 0.5–1.9)

## 2020-08-29 MED ORDER — ACETAMINOPHEN 325 MG PO TABS
650.0000 mg | ORAL_TABLET | Freq: Once | ORAL | Status: DC
  Filled 2020-08-29: qty 2

## 2020-08-29 MED ORDER — SODIUM CHLORIDE 0.9 % IV BOLUS
125.0000 mL | Freq: Once | INTRAVENOUS | Status: AC
  Administered 2020-08-29: 125 mL via INTRAVENOUS

## 2020-08-29 MED ORDER — LIDOCAINE-EPINEPHRINE 1 %-1:100000 IJ SOLN
10.0000 mL | Freq: Once | INTRAMUSCULAR | Status: AC
  Administered 2020-08-29: 10 mL
  Filled 2020-08-29: qty 1

## 2020-08-29 MED ORDER — AMOXICILLIN 500 MG PO CAPS
500.0000 mg | ORAL_CAPSULE | Freq: Once | ORAL | Status: DC
  Filled 2020-08-29: qty 1

## 2020-08-29 MED ORDER — AMOXICILLIN 500 MG PO CAPS
500.0000 mg | ORAL_CAPSULE | Freq: Three times a day (TID) | ORAL | 0 refills | Status: AC
Start: 2020-08-29 — End: ?

## 2020-08-29 MED ORDER — LIDOCAINE-EPINEPHRINE (PF) 2 %-1:200000 IJ SOLN: 10.0000 mL | Freq: Once | INTRAMUSCULAR | Status: DC

## 2020-08-29 MED ORDER — SODIUM ZIRCONIUM CYCLOSILICATE 10 G PO PACK: 10.0000 g | PACK | Freq: Once | ORAL | Status: DC

## 2020-08-29 NOTE — Discharge Instructions (Addendum)
Hold plavix for 1 week. Hold potassium until potassium is rechecked. Recheck bmp in 1-2 days.

## 2020-08-29 NOTE — ED Triage Notes (Signed)
Pt BIB [facility] from [facility] c/o a unwitnessed fall. Pt c/o of a headache and lower back pain. Pt has a hx of dementia and is on plavix. Pt does have a upper lip laceration.

## 2020-08-29 NOTE — ED Notes (Signed)
Patient transported to X-ray 

## 2020-08-29 NOTE — ED Notes (Deleted)
Called [facility] to transport patient to [facility]. Patient on 4 liters.

## 2020-08-29 NOTE — ED Notes (Signed)
Patient transported to CT 

## 2020-08-29 NOTE — Progress Notes (Addendum)
Manufacturing engineer [facility]) Hospital Liaison note.       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  His sister [name] is HCPOA and can be reached at [phone].  Per hospice chart, pt has an OOF DNR in place.  If he is discharged this evening please be sure to use [facility], as we contactwith them for our active hospice pts  [place] liaisons will continue to follow throughout hospital stay. Please do not hesitate to contact our team with any questions.   Thank you for the opportunity to participate in this patient's care.   Domenic Moras, BSN, RN [facility] Liaison (listed on [place] under Hospice/[facility])    [phone] (24h on call)

## 2020-08-29 NOTE — ED Provider Notes (Signed)
[facility] EMERGENCY DEPARTMENT Provider Note   CSN: [id number] Arrival date & time: 08/29/20  1706     History Chief Complaint  Patient presents with   Caleb Bumpers Karge Sr. is a 80 y.o. male.  Pt presents to the ED today with a fall.  Pt is from [facility] and had an unwitnessed fall.  He did hit his face and sustained a lac to his upper lip.  He has dementia and is unable to tell me what happened.  Pt told EMS his head and back hurt.  Pt is on Plavix.      Past Medical History:  Diagnosis Date   Dementia ([place])    Diabetes mellitus without complication ([place])    Hypertension     Patient Active Problem List   Diagnosis Date Noted   Dementia ([place]) 08/29/2020    History reviewed. No pertinent surgical history.     History reviewed. No pertinent family history.     Home Medications Prior to Admission medications   Medication Sig Start Date End Date Taking? Authorizing Provider  acetaminophen (TYLENOL) 500 MG tablet Take 500 mg by mouth 3 (three) times daily.   Yes [provider]  albuterol (PROVENTIL) (2.5 MG/3ML) 0.083% nebulizer solution Take 2.5 mg by nebulization every 4 (four) hours as needed for shortness of breath.   Yes [provider]  amoxicillin (AMOXIL) 500 MG capsule Take 1 capsule (500 mg total) by mouth 3 (three) times daily. 08/29/20  Yes Isla Pence, MD  aspirin EC 81 MG tablet Take 81 mg by mouth every morning. Swallow whole.   Yes [provider]  chlorthalidone (HYGROTON) 25 MG tablet Take 25 mg by mouth every morning.   Yes [provider]  clopidogrel (PLAVIX) 75 MG tablet Take 75 mg by mouth every morning.   Yes [provider]  clotrimazole-betamethasone (LOTRISONE) cream Apply 1 application topically See admin instructions. Apply topically to groin twice daily - for inflamed skin; may also use twice daily as needed for skin, tissue disease/order - right groin   Yes  [provider]  cycloSPORINE (RESTASIS) 0.05 % ophthalmic emulsion Place 1 drop into both eyes 2 (two) times daily.   Yes [provider]  famotidine (PEPCID) 20 MG tablet Take 20 mg by mouth every morning.   Yes [provider]  finasteride (PROSCAR) 5 MG tablet Take 5 mg by mouth at bedtime.   Yes [provider]  FLUoxetine (PROZAC) 20 MG capsule Take 20 mg by mouth every morning.   Yes [provider]  gabapentin (NEURONTIN) 100 MG capsule Take 100 mg by mouth 3 (three) times daily.   Yes [provider]  hydrALAZINE (APRESOLINE) 50 MG tablet Take 50 mg by mouth 3 (three) times daily.   Yes [provider]  isosorbide mononitrate (IMDUR) 30 MG 24 hr tablet Take 30 mg by mouth every morning.   Yes [provider]  Lidocaine 4 % PTCH Place 1 patch onto the skin See admin instructions. Apply one patch to lower back every morning and remove every night   Yes [provider]  losartan (COZAAR) 100 MG tablet Take 100 mg by mouth every morning.   Yes [provider]  metoprolol succinate (TOPROL-XL) 100 MG 24 hr tablet Take 100 mg by mouth every morning. Take with or immediately following a meal.   Yes [provider]  nitroGLYCERIN (NITROSTAT) 0.4 MG SL tablet Place 0.4 mg under  the tongue every 5 (five) minutes as needed for chest pain (if no relief after 2 doses call MD).   Yes [provider]  Nutritional Supplement LIQD Take 120 mLs by mouth 3 (three) times daily with meals.   Yes [provider]  potassium chloride SA (KLOR-CON) 20 MEQ tablet Take 20 mEq by mouth 2 (two) times daily.   Yes [provider]  senna-docusate (SENOKOT-S) 8.6-50 MG tablet Take 1 tablet by mouth 2 (two) times daily.   Yes [provider]  Tetrahydrozoline HCl (VISINE OP) Place 2 drops into both eyes 2 (two) times daily.   Yes [provider]    Allergies    Benazepril,  Hydroxyzine, and Verapamil  Review of Systems   Review of Systems  Unable to perform ROS: Dementia   Physical Exam Updated Vital Signs BP (!) 166/102   Pulse 72   Temp 98.2 F (36.8 C) (Oral)   Resp 16   Ht '5\' 4"'$  (1.626 m)   Wt 73.8 kg   SpO2 100%   BMI 27.94 kg/m   Physical Exam Vitals and nursing note reviewed.  Constitutional:      Appearance: Normal appearance.  HENT:     Head: Normocephalic.     Comments: 2 cm lac to upper lip.  He also has a small lac to his inner upper lip right at the gingiva.  Upper teeth are worn to nubs.    Right Ear: External ear normal.     Left Ear: External ear normal.     Nose: Nose normal.  Neck:     Comments: In c-collar Cardiovascular:     Rate and Rhythm: Normal rate and regular rhythm.  Pulmonary:     Effort: Pulmonary effort is normal.     Breath sounds: Normal breath sounds.  Abdominal:     General: Abdomen is flat. Bowel sounds are normal.  Musculoskeletal:        General: Normal range of motion.  Skin:    General: Skin is warm.     Capillary Refill: Capillary refill takes less than 2 seconds.  Neurological:     General: No focal deficit present.     Mental Status: He is alert. Mental status is at baseline.  Psychiatric:        Mood and Affect: Mood normal.    ED Results / Procedures / Treatments   Labs (all labs ordered are listed, but only abnormal results are displayed) Labs Reviewed  COMPREHENSIVE METABOLIC PANEL - Abnormal; Notable for the following components:      Result Value   Sodium 130 (*)    Potassium 7.1 (*)    CO2 19 (*)    Glucose, Bld 111 (*)    BUN 48 (*)    Creatinine, Ser 3.36 (*)    Albumin 3.4 (*)    GFR, Estimated 18 (*)    All other components within normal limits  CBC - Abnormal; Notable for the following components:   Hemoglobin 12.7 (*)    All other components within normal limits  BASIC METABOLIC PANEL - Abnormal; Notable for the following components:   Sodium 133 (*)    Potassium  6.6 (*)    CO2 19 (*)    Glucose, Bld 106 (*)    BUN 52 (*)    Creatinine, Ser 3.33 (*)    GFR, Estimated 18 (*)    All other components within normal limits  RESP PANEL BY RT-PCR (FLU A&B, COVID) ARPGX2  ETHANOL  LACTIC ACID, PLASMA  PROTIME-INR  URINALYSIS, ROUTINE W REFLEX MICROSCOPIC  SAMPLE TO BLOOD BANK    EKG EKG Interpretation  Date/Time:  Monday August 29 2020 17:15:46 EDT Ventricular Rate:  64 PR Interval:  206 QRS Duration: 121 QT Interval:  443 QTC Calculation: 458 R Axis:   -38 Text Interpretation: Sinus rhythm IVCD, consider atypical RBBB Left ventricular hypertrophy Abnormal T, consider ischemia, lateral leads Anterior ST elevation, probably due to LVH No old tracing to compare Confirmed by Isla Pence [phone]) on 08/29/2020 5:38:50 PM  Radiology DG Lumbar Spine Complete  Result Date: 08/29/2020 CLINICAL DATA:  Un witnessed fall with low back pain, initial encounter EXAM: LUMBAR SPINE - COMPLETE 4+ VIEW COMPARISON:  None. FINDINGS: Five lumbar type vertebral bodies are well visualized. Vertebral body height is well maintained. No anterolisthesis is noted. Multilevel flowing anterior osteophytes are noted. No pars defects are seen. No soft tissue abnormality is noted. IMPRESSION: Multilevel degenerative change without acute abnormality. Electronically Signed   By: [name] M.D.   On: 08/29/2020 19:47   CT HEAD WO CONTRAST  Result Date: 08/29/2020 CLINICAL DATA:  Head trauma, minor (Age >= 65y).  Fall. EXAM: CT HEAD WITHOUT CONTRAST TECHNIQUE: Contiguous axial images were obtained from the base of the skull through the vertex without intravenous contrast. COMPARISON:  11/11/2016 FINDINGS: Brain: There is high-density blood noted along the falx compatible with subdural hematoma measuring 6 mm in thickness. No mass effect or midline shift. There is atrophy and chronic small vessel disease changes. No hydrocephalus old left basal ganglia and periventricular lacunar  infarcts. Vascular: No hyperdense vessel or unexpected calcification. Skull: No acute calvarial abnormality. Sinuses/Orbits: Left frontal osteoma noted, unchanged. Postoperative changes in the right orbit, stable. No acute findings. Other: None IMPRESSION: Small midline subdural hematoma along the falx measuring 6 mm in thickness. These results were called by telephone at the time of interpretation on 08/29/2020 at 6:06 pm to provider [facility] , who verbally acknowledged these results. Electronically Signed   By: Rolm Baptise M.D.   On: 08/29/2020 18:07   CT CERVICAL SPINE WO CONTRAST  Result Date: 08/29/2020 CLINICAL DATA:  Fall.  Neck trauma (Age >= 65y) EXAM: CT CERVICAL SPINE WITHOUT CONTRAST TECHNIQUE: Multidetector CT imaging of the cervical spine was performed without intravenous contrast. Multiplanar CT image reconstructions were also generated. COMPARISON:  None. FINDINGS: Alignment: Normal Skull base and vertebrae: No acute fracture. No primary bone lesion or focal pathologic process. Soft tissues and spinal canal: No prevertebral fluid or swelling. No visible canal hematoma. Disc levels: Degenerative disc and facet disease throughout the cervical spine. Upper chest: No acute findings Other: None IMPRESSION: Degenerative disc and facet disease.  No acute bony abnormality. Electronically Signed   By: Rolm Baptise M.D.   On: 08/29/2020 18:08   DG Pelvis Portable  Result Date: 08/29/2020 CLINICAL DATA:  Unwitnessed fall EXAM: PORTABLE PELVIS 1-2 VIEWS COMPARISON:  None. FINDINGS: Hip joints and SI joints symmetric and unremarkable. Degenerative changes in the visualized lower lumbar spine. No acute bony abnormality. Specifically, no fracture, subluxation, or dislocation. IMPRESSION: No acute bony abnormality. Electronically Signed   By: Rolm Baptise M.D.   On: 08/29/2020 17:52   DG Chest Port 1 View  Result Date: 08/29/2020 CLINICAL DATA:  Unwitnessed fall EXAM: PORTABLE CHEST 1 VIEW COMPARISON:   03/08/2018 FINDINGS: Prior CABG. Cardiomegaly. Lungs clear. No effusions or acute bony abnormality. IMPRESSION: No active disease. Electronically Signed   By: Rolm Baptise  M.D.   On: 08/29/2020 17:51   CT MAXILLOFACIAL WO CONTRAST  Result Date: 08/29/2020 CLINICAL DATA:  Facial trauma.  Fall. EXAM: CT MAXILLOFACIAL WITHOUT CONTRAST TECHNIQUE: Multidetector CT imaging of the maxillofacial structures was performed. Multiplanar CT image reconstructions were also generated. COMPARISON:  None. FINDINGS: Osseous: No acute bony abnormality. Specifically, no fracture, subluxation, or dislocation. Numerous dental caries. Lucency noted around the roots of multiple maxillary and mandibular teeth compatible with periapical abscesses. Orbits: No orbital fracture. Globes are intact. Postoperative changes in the right orbit. Sinuses: Clear Soft tissues: Negative Limited intracranial: See head CT report IMPRESSION: No facial or orbital fracture. Multiple dental caries and periapical abscesses. Electronically Signed   By: Rolm Baptise M.D.   On: 08/29/2020 18:12    Procedures .Marland KitchenLaceration Repair  Date/Time: 08/29/2020 7:08 PM Performed by: Isla Pence, MD Authorized by: Isla Pence, MD   Consent:    Consent obtained:  Verbal Universal protocol:    Procedure explained and questions answered to patient or proxy's satisfaction: yes     Patient identity confirmed:  Verbally with patient Anesthesia:    Anesthesia method:  Local infiltration   Local anesthetic:  Lidocaine 2% WITH epi Laceration details:    Location:  Lip   Lip location:  Upper exterior lip   Length (cm):  2 Pre-procedure details:    Preparation:  Patient was prepped and draped in usual sterile fashion Exploration:    Hemostasis achieved with:  Epinephrine Treatment:    Area cleansed with:  Povidone-iodine and saline   Amount of cleaning:  Standard Skin repair:    Repair method:  Sutures   Suture size:  6-0   Suture material:   Prolene   Suture technique:  Simple interrupted   Number of sutures:  4 Repair type:    Repair type:  Simple Post-procedure details:    Procedure completion:  Tolerated well, no immediate complications .Marland KitchenLaceration Repair  Date/Time: 08/29/2020 7:09 PM Performed by: Isla Pence, MD Authorized by: Isla Pence, MD   Universal protocol:    Patient identity confirmed:  Verbally with patient Anesthesia:    Anesthesia method:  Local infiltration   Local anesthetic:  Lidocaine 2% WITH epi Laceration details:    Location:  Lip   Lip location:  Upper interior lip   Length (cm):  1 Skin repair:    Repair method:  Sutures   Suture size:  4-0   Suture material:  Chromic gut   Suture technique:  Simple interrupted   Number of sutures:  3 Approximation:    Approximation:  Loose Repair type:    Repair type:  Simple Post-procedure details:    Dressing:  Open (no dressing)   Procedure completion:  Tolerated well, no immediate complications   Medications Ordered in ED Medications  amoxicillin (AMOXIL) capsule 500 mg (0 mg Oral Hold 08/29/20 2004)  acetaminophen (TYLENOL) tablet 650 mg (0 mg Oral Hold 08/29/20 2004)  sodium zirconium cyclosilicate (LOKELMA) packet 10 g (has no administration in time range)  sodium chloride 0.9 % bolus 125 mL (0 mLs Intravenous Stopped 08/29/20 1914)  lidocaine-EPINEPHrine (XYLOCAINE W/EPI) 1 %-1:100000 (with pres) injection 10 mL (10 mLs Infiltration Given 08/29/20 1801)    ED Course  I have reviewed the triage vital signs and the nursing notes.  Pertinent labs & imaging results that were available during my care of the patient were reviewed by me and considered in my medical decision making (see chart for details).    MDM Rules/Calculators/A&P  Pt d/w Dr. Marcello Moores (NS).  He recommends holding plavix for a week.  As he is on hospice, he can go back to the SNF.  I will put him on Amox for the dental caries and the  through-through lac.    K is elevated.  He is on oral K at home.  That will be stopped.  I will give him a dose of lokelma tonight.  Pt is on hospice and has no ekg changes.  He is stable for d/c.  He is to get a repeat bmp in the next 1-2 days.  Sutures need to be removed in 5 days.  Final Clinical Impression(s) / ED Diagnoses Final diagnoses:  Trauma  Fall, initial encounter  Subdural hematoma ([place])  Lip laceration, initial encounter  Dental caries  Hyperkalemia  CKD (chronic kidney disease) stage 4, GFR 15-29 ml/min [facility])  Hospice care patient    Rx / DC Orders ED Discharge Orders          Ordered    amoxicillin (AMOXIL) 500 MG capsule  3 times daily        08/29/20 1913             Isla Pence, MD 08/29/20 2159

## 2020-08-29 NOTE — ED Notes (Signed)
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  Patient is going to [facility], Will be a few hours as they are a little behind tonight.

## 2020-08-29 NOTE — Progress Notes (Signed)
Orthopedic Tech Progress Note Patient Details:  Caleb Nong Sr. Feb 16, 1940 [id number]  Level 2 trauma   Patient ID: Caleb Nim Sr., male   DOB: 09-28-1940, 80 y.o.   MRN: [id number]  Janit Pagan 08/29/2020, 5:12 PM

## 2020-08-29 NOTE — ED Notes (Signed)
..  Trauma Response Nurse Note-  Reason for Call / Reason for Trauma activation:   Level 2 activation- due to fall on blood thinners-   Initial Focused Assessment (If applicable, or please see trauma documentation):  Pt is a resident of [facility] and also an [employer] pt. [place] today, striking head on floor. Small laceration on upper lip. Oozing blood, but controlled. Pt is at baseline per [facility] RN that assessed him in the ED.  Interventions: Xray CT Labs IV  Plan of Care as of this note: Pending results currently    Rolene Arbour, RN  Trauma Response Nurse [phone]

## 2020-08-30 ENCOUNTER — Encounter (HOSPITAL_COMMUNITY): Payer: Self-pay | Admitting: *Deleted

## 2020-10-22 DEATH — deceased
